# Patient Record
Sex: Female | Born: 1960
Health system: Southern US, Community
[De-identification: ages and names within clinical notes are randomized; demographics above are authoritative.]

## PROBLEM LIST (undated history)

## (undated) ENCOUNTER — Emergency Department (HOSPITAL_COMMUNITY): Admission: EM | Payer: Medicaid Other | Source: Home / Self Care

## (undated) DIAGNOSIS — M51369 Other intervertebral disc degeneration, lumbar region without mention of lumbar back pain or lower extremity pain: Secondary | ICD-10-CM

## (undated) DIAGNOSIS — J449 Chronic obstructive pulmonary disease, unspecified: Secondary | ICD-10-CM

## (undated) DIAGNOSIS — Z91148 Patient's other noncompliance with medication regimen for other reason: Secondary | ICD-10-CM

## (undated) DIAGNOSIS — I509 Heart failure, unspecified: Secondary | ICD-10-CM

## (undated) DIAGNOSIS — IMO0001 Reserved for inherently not codable concepts without codable children: Secondary | ICD-10-CM

## (undated) DIAGNOSIS — Z9114 Patient's other noncompliance with medication regimen: Secondary | ICD-10-CM

## (undated) DIAGNOSIS — M5136 Other intervertebral disc degeneration, lumbar region: Secondary | ICD-10-CM

## (undated) DIAGNOSIS — I739 Peripheral vascular disease, unspecified: Secondary | ICD-10-CM

## (undated) DIAGNOSIS — I10 Essential (primary) hypertension: Secondary | ICD-10-CM

## (undated) HISTORY — DX: Reserved for inherently not codable concepts without codable children: IMO0001

## (undated) HISTORY — PX: TUBAL LIGATION: SHX77

## (undated) HISTORY — DX: Peripheral vascular disease, unspecified: I73.9

## (undated) HISTORY — DX: Essential (primary) hypertension: I10

## (undated) HISTORY — DX: Heart failure, unspecified: I50.9

## (undated) HISTORY — DX: Chronic obstructive pulmonary disease, unspecified: J44.9

## (undated) HISTORY — PX: CARDIAC CATHETERIZATION: SHX172

---

## 2000-09-10 ENCOUNTER — Other Ambulatory Visit: Admission: RE | Admit: 2000-09-10 | Discharge: 2000-09-10 | Payer: Self-pay | Admitting: *Deleted

## 2000-09-12 ENCOUNTER — Emergency Department (HOSPITAL_COMMUNITY): Admission: EM | Admit: 2000-09-12 | Discharge: 2000-09-12 | Payer: Self-pay | Admitting: Emergency Medicine

## 2000-09-15 ENCOUNTER — Encounter: Payer: Self-pay | Admitting: *Deleted

## 2000-09-15 ENCOUNTER — Ambulatory Visit (HOSPITAL_COMMUNITY): Admission: RE | Admit: 2000-09-15 | Discharge: 2000-09-15 | Payer: Self-pay | Admitting: *Deleted

## 2001-02-26 ENCOUNTER — Emergency Department (HOSPITAL_COMMUNITY): Admission: EM | Admit: 2001-02-26 | Discharge: 2001-02-26 | Payer: Self-pay | Admitting: Emergency Medicine

## 2001-12-05 ENCOUNTER — Emergency Department (HOSPITAL_COMMUNITY): Admission: EM | Admit: 2001-12-05 | Discharge: 2001-12-05 | Payer: Self-pay | Admitting: Emergency Medicine

## 2002-04-30 ENCOUNTER — Emergency Department (HOSPITAL_COMMUNITY): Admission: EM | Admit: 2002-04-30 | Discharge: 2002-04-30 | Payer: Self-pay | Admitting: Emergency Medicine

## 2003-04-23 ENCOUNTER — Emergency Department (HOSPITAL_COMMUNITY): Admission: EM | Admit: 2003-04-23 | Discharge: 2003-04-23 | Payer: Self-pay | Admitting: Emergency Medicine

## 2003-07-07 ENCOUNTER — Emergency Department (HOSPITAL_COMMUNITY): Admission: EM | Admit: 2003-07-07 | Discharge: 2003-07-07 | Payer: Self-pay | Admitting: Emergency Medicine

## 2004-02-17 ENCOUNTER — Emergency Department (HOSPITAL_COMMUNITY): Admission: EM | Admit: 2004-02-17 | Discharge: 2004-02-17 | Payer: Self-pay | Admitting: Emergency Medicine

## 2004-03-08 ENCOUNTER — Emergency Department (HOSPITAL_COMMUNITY): Admission: EM | Admit: 2004-03-08 | Discharge: 2004-03-08 | Payer: Self-pay | Admitting: Emergency Medicine

## 2004-04-23 ENCOUNTER — Emergency Department (HOSPITAL_COMMUNITY): Admission: EM | Admit: 2004-04-23 | Discharge: 2004-04-23 | Payer: Self-pay | Admitting: Emergency Medicine

## 2004-07-16 ENCOUNTER — Emergency Department (HOSPITAL_COMMUNITY): Admission: EM | Admit: 2004-07-16 | Discharge: 2004-07-16 | Payer: Self-pay | Admitting: *Deleted

## 2006-05-07 ENCOUNTER — Emergency Department (HOSPITAL_COMMUNITY): Admission: EM | Admit: 2006-05-07 | Discharge: 2006-05-07 | Payer: Self-pay | Admitting: Emergency Medicine

## 2007-08-22 ENCOUNTER — Emergency Department (HOSPITAL_COMMUNITY): Admission: EM | Admit: 2007-08-22 | Discharge: 2007-08-22 | Payer: Self-pay | Admitting: Emergency Medicine

## 2007-08-24 ENCOUNTER — Emergency Department (HOSPITAL_COMMUNITY): Admission: EM | Admit: 2007-08-24 | Discharge: 2007-08-24 | Payer: Self-pay | Admitting: Emergency Medicine

## 2008-11-07 ENCOUNTER — Emergency Department (HOSPITAL_COMMUNITY): Admission: EM | Admit: 2008-11-07 | Discharge: 2008-11-07 | Payer: Self-pay | Admitting: Emergency Medicine

## 2010-03-25 ENCOUNTER — Emergency Department (HOSPITAL_COMMUNITY): Payer: Medicaid Other

## 2010-03-25 ENCOUNTER — Inpatient Hospital Stay (HOSPITAL_COMMUNITY)
Admission: EM | Admit: 2010-03-25 | Discharge: 2010-03-27 | Disposition: A | Discharge status: Other Acute Inpatient Hospital | Payer: Medicaid Other | Source: Home / Self Care | Attending: Pulmonary Disease | Admitting: Pulmonary Disease

## 2010-03-25 DIAGNOSIS — I509 Heart failure, unspecified: Secondary | ICD-10-CM | POA: Diagnosis present

## 2010-03-25 DIAGNOSIS — Z9119 Patient's noncompliance with other medical treatment and regimen: Secondary | ICD-10-CM

## 2010-03-25 DIAGNOSIS — Z6839 Body mass index (BMI) 39.0-39.9, adult: Secondary | ICD-10-CM

## 2010-03-25 DIAGNOSIS — J4489 Other specified chronic obstructive pulmonary disease: Secondary | ICD-10-CM | POA: Diagnosis present

## 2010-03-25 DIAGNOSIS — J449 Chronic obstructive pulmonary disease, unspecified: Secondary | ICD-10-CM | POA: Diagnosis present

## 2010-03-25 DIAGNOSIS — I5021 Acute systolic (congestive) heart failure: Principal | ICD-10-CM | POA: Diagnosis present

## 2010-03-25 DIAGNOSIS — I10 Essential (primary) hypertension: Secondary | ICD-10-CM | POA: Diagnosis present

## 2010-03-25 DIAGNOSIS — Z91199 Patient's noncompliance with other medical treatment and regimen due to unspecified reason: Secondary | ICD-10-CM

## 2010-03-25 DIAGNOSIS — R9389 Abnormal findings on diagnostic imaging of other specified body structures: Secondary | ICD-10-CM | POA: Diagnosis present

## 2010-03-25 DIAGNOSIS — IMO0001 Reserved for inherently not codable concepts without codable children: Secondary | ICD-10-CM | POA: Diagnosis present

## 2010-03-25 DIAGNOSIS — R0789 Other chest pain: Secondary | ICD-10-CM | POA: Diagnosis present

## 2010-03-25 DIAGNOSIS — Z5987 Material hardship due to limited financial resources, not elsewhere classified: Secondary | ICD-10-CM

## 2010-03-25 DIAGNOSIS — I2510 Atherosclerotic heart disease of native coronary artery without angina pectoris: Secondary | ICD-10-CM | POA: Diagnosis present

## 2010-03-25 DIAGNOSIS — I428 Other cardiomyopathies: Secondary | ICD-10-CM | POA: Diagnosis present

## 2010-03-25 DIAGNOSIS — Z598 Other problems related to housing and economic circumstances: Secondary | ICD-10-CM

## 2010-03-25 DIAGNOSIS — F17200 Nicotine dependence, unspecified, uncomplicated: Secondary | ICD-10-CM | POA: Diagnosis present

## 2010-03-25 LAB — CBC
MCH: 30.6 pg (ref 26.0–34.0)
MCHC: 32.8 g/dL (ref 30.0–36.0)
MCV: 93.3 fL (ref 78.0–100.0)
Platelets: 239 10*3/uL (ref 150–400)
RDW: 16 % — ABNORMAL HIGH (ref 11.5–15.5)

## 2010-03-25 LAB — BASIC METABOLIC PANEL
CO2: 25 mEq/L (ref 19–32)
Chloride: 96 mEq/L (ref 96–112)
Creatinine, Ser: 0.61 mg/dL (ref 0.4–1.2)
GFR calc Af Amer: >60 mL/min (ref >60)

## 2010-03-25 LAB — CK TOTAL AND CKMB (NOT AT ARMC): CK, MB: 3.8 ng/mL (ref 0.3–4.0)

## 2010-03-25 LAB — D-DIMER, QUANTITATIVE: D-Dimer, Quant: 0.34 ug/mL-FEU (ref 0.00–0.48)

## 2010-03-25 LAB — BRAIN NATRIURETIC PEPTIDE: Pro B Natriuretic peptide (BNP): 146 pg/mL — ABNORMAL HIGH (ref 0.0–100.0)

## 2010-03-26 DIAGNOSIS — I059 Rheumatic mitral valve disease, unspecified: Secondary | ICD-10-CM

## 2010-03-26 DIAGNOSIS — R079 Chest pain, unspecified: Secondary | ICD-10-CM

## 2010-03-26 LAB — DIFFERENTIAL
Basophils Absolute: 0 10*3/uL (ref 0.0–0.1)
Basophils Relative: 0 % (ref 0–1)
Eosinophils Relative: 1 % (ref 0–5)
Monocytes Absolute: 0.5 10*3/uL (ref 0.1–1.0)
Neutro Abs: 5.2 10*3/uL (ref 1.7–7.7)

## 2010-03-26 LAB — BASIC METABOLIC PANEL
Calcium: 8.5 mg/dL (ref 8.4–10.5)
Creatinine, Ser: 0.72 mg/dL (ref 0.4–1.2)
GFR calc Af Amer: >60 mL/min (ref >60)
GFR calc non Af Amer: >60 mL/min (ref >60)

## 2010-03-26 LAB — T3: T3, Total: 123 ng/dl (ref 80.0–204.0)

## 2010-03-26 LAB — CBC
Hemoglobin: 12.4 g/dL (ref 12.0–15.0)
MCHC: 32.8 g/dL (ref 30.0–36.0)
Platelets: 214 10*3/uL (ref 150–400)
RDW: 16.1 % — ABNORMAL HIGH (ref 11.5–15.5)

## 2010-03-26 LAB — TSH: TSH: 1.889 u[IU]/mL (ref 0.350–4.500)

## 2010-03-26 LAB — TROPONIN I: Troponin I: 0.04 ng/mL (ref 0.00–0.06)

## 2010-03-26 NOTE — Consult Note (Signed)
Felicia [name], Felicia [name]            ACCOUNT NO.:  [id number]  MEDICAL RECORD NO.:  [id number]           PATIENT TYPE:  I  LOCATION:  A334                          FACILITY:  [facility]  PHYSICIAN:  Jonelle Sidle, MD DATE OF BIRTH:  02-18-1960  DATE OF CONSULTATION: DATE OF DISCHARGE:                                CONSULTATION   ADDENDUM.  REQUESTING PHYSICIAN:  Edward L. Juanetta Gosling, M.D.  REASON FOR CONSULTATION:  Shortness of breath and hypertension.  Please refer to the full cardiology consultation dictation by Ms. Joni Reining, nurse practitioner.  HISTORY OF PRESENT ILLNESS:  In summary, Felicia [name] is a 50 year old morbidly obese woman with a history of hypertension, noncompliant with medications and regular follow up.  She presented with a history of progressing dyspnea on exertion over the last few months, increased in intensity of late.  She was noted to be significantly hypertensive with blood pressures as high as 214/110, off medications.  She was assessed in the Emergency Department, treated initially with intravenous enalapril and initiated on nitroglycerin paste, placed subsequently on clonidine and hydralazine, and has shown better blood pressure control. She feels better symptomatically.  Her BNP level was only mildly increased at 146 and her chest x-ray did show some evidence of volume overload with pulmonary vascular congestion and cardiomegaly.  Cardiac markers have been reassuring however, and she has not reported any chest pain during the observation, peak troponin-I 0.04.  No previous structural cardiac workup is noted.  PHYSICAL EXAMINATION:  VITAL SIGNS:  On my examination, her temperature is 97.8 degrees, heart rate is in the 80s to 90s and sinus rhythm, respirations 16, blood pressure 149/95, ox saturation 91%-95% on room air. GENERAL:  She is morbidly obese, 93 kg, short in stature. HEENT:  Conjunctivae and lids normal.  Oropharynx is clear with  moist mucosa. NECK:  Supple.  No elevated JVP.  No carotid bruits.  Increased girth. Difficult to assess thyroid. LUNGS:  Clear with diminished breath sounds. CARDIAC:  Distant regular heart sounds.  Questionable soft S4.  No significant systolic murmur or pericardial rub. ABDOMEN:  Obese.  Unable to palpate liver edge.  Bowel sounds present. EXTREMITIES:  Exhibit no frank pitting edema.  Distal pulses 1 to 2+. SKIN:  Warm and dry. MUSCULOSKELETAL:  No kyphosis is noted. NEUROPSYCHIATRIC:  The patient is alert and oriented x3.  Affect seems appropriate.  LABORATORY DATA:  Hemoglobin is 12.4, platelets 214.  D-dimer 0.34. Potassium 3.9, sodium 135, BUN and creatinine 13 and 0.7 respectively.  A 12-lead electrocardiogram shows sinus rhythm with nonspecific T-wave changes in the inferolateral leads, corrected QT interval prolonged ranging from 474 up to 490.  IMPRESSION: 1. Hypertension, poorly controlled, affected by noncompliance.  Blood     pressure trend is coming under better control. 2. Presentation with progressive shortness of breath as noted,     question component of acute systolic or diastolic heart failure, complicated by     significant hypertension.  Follow up 2-D echocardiogram is pending.     Cardiac markers argue against an acute coronary syndrome as does     electrocardiogram. 3 . Morbid obesity,  question potential for obstructive sleep apnea, which could also adversely affect blood pressure control.  RECOMMENDATIONS:  Medications will be simplified to Norvasc and low-dose diuretic such as chlorthalidone for now although may be more specifically  guided by the echocardiogram results. Would tend to avoid clonidine with history of noncompliance and hydralazine given t.i.d. dosing.  Will follow up on the 2-D echocardiogram that has been ordered and can make further recommendations from there.     Jonelle Sidle, MD     SGM/MEDQ  D:  03/26/2010  T:   03/26/2010  Job:  [id number]  cc:   Ramon Dredge L. Juanetta Gosling, M.D. Fax: [phone]  Electronically Signed by Nona Dell MD on 03/26/2010 12:45:34 PM

## 2010-03-27 ENCOUNTER — Encounter (HOSPITAL_COMMUNITY): Payer: Self-pay

## 2010-03-27 ENCOUNTER — Inpatient Hospital Stay (HOSPITAL_COMMUNITY)
Admission: AD | Admit: 2010-03-27 | Discharge: 2010-03-28 | DRG: 287 | Disposition: A | Discharge status: Home / Self Care | Payer: Medicaid Other | Source: Other Acute Inpatient Hospital | Attending: Internal Medicine | Admitting: Internal Medicine

## 2010-03-27 DIAGNOSIS — I5021 Acute systolic (congestive) heart failure: Secondary | ICD-10-CM

## 2010-03-27 DIAGNOSIS — I509 Heart failure, unspecified: Secondary | ICD-10-CM

## 2010-03-27 LAB — BASIC METABOLIC PANEL
CO2: 29 mEq/L (ref 19–32)
Glucose, Bld: 176 mg/dL — ABNORMAL HIGH (ref 70–99)
Potassium: 4.2 mEq/L (ref 3.5–5.1)
Sodium: 131 mEq/L — ABNORMAL LOW (ref 135–145)

## 2010-03-27 LAB — LIPID PANEL
HDL: 47 mg/dL (ref >39)
LDL Cholesterol: 64 mg/dL (ref 0–99)
Triglycerides: 101 mg/dL (ref <150)
VLDL: 20 mg/dL (ref 0–40)

## 2010-03-28 DIAGNOSIS — I428 Other cardiomyopathies: Secondary | ICD-10-CM

## 2010-03-28 LAB — BASIC METABOLIC PANEL
BUN: 11 mg/dL (ref 6–23)
Calcium: 8.9 mg/dL (ref 8.4–10.5)
GFR calc non Af Amer: >60 mL/min (ref >60)
Potassium: 4 mEq/L (ref 3.5–5.1)

## 2010-03-28 LAB — POCT I-STAT 3, ART BLOOD GAS (G3+)
Acid-Base Excess: 3 mmol/L — ABNORMAL HIGH (ref 0.0–2.0)
pCO2 arterial: 43.4 mmHg (ref 35.0–45.0)
pO2, Arterial: 54 mmHg — ABNORMAL LOW (ref 80.0–100.0)

## 2010-03-28 LAB — POCT ACTIVATED CLOTTING TIME
Activated Clotting Time: 116 seconds
Activated Clotting Time: 205 seconds

## 2010-03-28 LAB — CBC
MCHC: 32.9 g/dL (ref 30.0–36.0)
MCV: 93.1 fL (ref 78.0–100.0)
Platelets: 231 10*3/uL (ref 150–400)
RDW: 15.8 % — ABNORMAL HIGH (ref 11.5–15.5)
WBC: 10.1 10*3/uL (ref 4.0–10.5)

## 2010-03-28 LAB — POCT I-STAT 3, VENOUS BLOOD GAS (G3P V)
pCO2, Ven: 50.4 mmHg — ABNORMAL HIGH (ref 45.0–50.0)
pH, Ven: 7.39 — ABNORMAL HIGH (ref 7.250–7.300)

## 2010-03-30 NOTE — Procedures (Signed)
Felicia [name], Felicia [name]            ACCOUNT NO.:  [id number]  MEDICAL RECORD NO.:  [id number]           PATIENT TYPE:  I  LOCATION:  3731                         FACILITY:  [facility]  PHYSICIAN:  Marca Ancona, MD      DATE OF BIRTH:  1960-06-29  DATE OF PROCEDURE:  03/27/2010 DATE OF DISCHARGE:                           CARDIAC CATHETERIZATION   PROCEDURES: 1. Left heart catheterization. 2. Right heart catheterization. 3. Coronary angiography. 4. Left ventriculography.  INDICATIONS:  This is a 50 year old with new-onset cardiomyopathy and congestive heart failure.  PROCEDURE NOTE:  After informed consent was obtained, the right groin was sterilely prepped and draped.  Lidocaine 1% was used to locally anesthetize the right groin area.  The right common femoral vein was entered using modified Seldinger technique.  A 7-French venous sheath was placed.  The right common femoral artery was accessed using modified Seldinger technique and a 5-French arterial sheath was placed.  Right heart catheterization was then carried out using balloon-tip Swan-Ganz catheter.  Samples were removed for oxygen saturation from the pulmonary artery and from the femoral artery.  Then the circumflex was engaged using the JL-4.0 catheter.  Of note, the circumflex and the LAD had separate ostia.  The LAD was engaged using the JL-3.5 catheter.  Right coronary artery was engaged using the JR-4 catheter.  Left ventricle was entered using angled pigtail catheter.  Hand ventriculogram only was done given LVEDP of 29.  There were no complications.  FINDINGS: 1. Hemodynamics:  Mean right atrial pressure 8 mmHg, RV 45/11, PA     48/22 with mean PA pressure of 32, pulmonary capillary wedge     pressure mean 17, mixed venous oxygen saturation 55%, aortic     saturation 97%, LV 150/29, aorta 151/92.  Cardiac output 3.49     liters per minute.  Cardiac index 1.88, pulmonary vascular     resistance 4.3 Wood units.   SVR 2497. 2. Left ventriculography:  Hand injection only for left ventriculogram was done given elevated     LVEDP.  The LV appeared mildly dilated, EF was about 30%.  There     was global hypokinesis. 3. Right coronary artery:  The right coronary artery was a dominant     vessel.  There were luminal irregularities only. 4. Left circumflex:  Left circumflex had separate ostium from the LAD.     There was a small first obtuse marginal with about 60% ostial stenosis.     There was a moderate second obtuse marginal about 40% ostial     stenosis. 5. LAD system:  The LAD had a separate ostium from the left circumflex. There was a high first diagonal with no significant disease.  The LAD itself had no significant disease.  IMPRESSION:  The patient has a nonischemic cardiomyopathy, left ventricular end-diastolic pressure and right atrial pressure are elevated still, although interestingly, the pulmonary capillary wedge pressure ended up looking okay.  Given the elevated left ventricular end- diastolic pressure, I think we should continue her diuresis, would advance her afterload reduction.     Marca Ancona, MD     DM/MEDQ  D:  03/27/2010  T:  03/28/2010  Job:  [id number]  Electronically Signed by Marca Ancona MD on 03/29/2010 09:22:00 AM

## 2010-04-02 NOTE — H&P (Addendum)
NAME:  Felicia [name], WACHTER            ACCOUNT NO.:  [id number]  MEDICAL RECORD NO.:  [id number]           PATIENT TYPE:  I  LOCATION:  A334                          FACILITY:  [facility]  PHYSICIAN:  Jermari Tamargo D. [name], MD   DATE OF BIRTH:  1960-01-26  DATE OF ADMISSION:  03/25/2010 DATE OF DISCHARGE:  LH                             HISTORY & PHYSICAL   CHIEF COMPLAINT:  Chest pain.  HISTORY OF PRESENT ILLNESS:  This is a 72 years' old female patient with history of hypertension and chronic obstructive pulmonary disease, came to emergency room due to the above complaint.  The patient claims she was started on antihypertensive medications few years back.  Later after her blood pressure was controlled, she was taken off the medications. She has a history of COPD, but has not been on treatment or regular followup.  She continued to smoke about half pack of cigarettes per day. Three days back, she started having chest pain in the midsternal area. Pain persisted for about 24 hours and later improved.  She was seen in the emergency room and was found to have high blood pressure.  Her initial cardiac enzymes were negative.  Due to her multiple risk factors, the patient was admitted for further evaluation and workup.  REVIEW OF SYSTEMS:  No fever or chills, nausea or vomiting, abdominal pain, dysuria, urgency or frequency of urination.  PAST MEDICAL HISTORY: 1. Hypertension. 2. Chronic obstructive pulmonary disease.  CURRENT MEDICATIONS:  The patient is not on any prescription or over-the- counter medications.  SOCIAL HISTORY:  The patient lives with her 2 children.  She smokes about half a pack of cigarettes per day.  She drinks alcohol socially. No history of alcohol abuse.  FAMILY HISTORY:  Her mother died of stroke.  Her father also died of lung cancer.  No history of diabetes in the family.  PHYSICAL EXAMINATION:  GENERAL:  The patient is alert, awake, and comfortable. VITAL SIGNS:   Blood pressure 149/95, pulse 88, respiratory rate 16, temperature 97.8 degrees Fahrenheit. HEENT: Pupils are equal and reactive. NECK: Supple. CHEST:  Decreased air entry, few rhonchi. CARDIOVASCULAR SYSTEM:  First and second heart sounds heard.  No murmur, no gallop. ABDOMEN:  Obese, soft and lax.  Bowel sounds are positive.  No mass or organomegaly. EXTREMITIES: No leg edema.  LABS ON ADMISSION:  CBC:  WBC 11.2, hemoglobin 14.1, hematocrit 43.0, platelets 269.  Sodium 135, potassium 3.9, chloride 99, carbon dioxide 28, glucose 196, BUN 13, creatinine 0.7, calcium 8.5, troponin 0.04, D- dimer 0.34, BNP 146.  ASSESSMENT: 1. Chest pain, to rule out unstable angina. 2. History of chronic obstructive pulmonary disease. 3. Hypertension, not controlled. 4. Morbid obesity.  PLAN:  We will do serial EKG, cardiac enzymes.  We will do echocardiogram.  We will adjust her blood pressure medications.  Start the patient on nebulizer treatment.  We will do Cardiology consult for further evaluation. Felicia [name] D. [name], MD   ______________________________ Felicia [name]. [name], MD    TDF/MEDQ  D:  03/26/2010  T:  03/26/2010  Job:  [id number]  Electronically Signed by Avon Gully MD on 04/23/2010  07:48:06 AM

## 2010-04-03 NOTE — Discharge Summary (Signed)
NAMESHEREDA, GRAW NO.:  [id number]  MEDICAL RECORD NO.:  [id number]           PATIENT TYPE:  I  LOCATION:  3731                         FACILITY:  [facility]  PHYSICIAN:  Luis Abed, MD, FACCDATE OF BIRTH:  1960/04/09  DATE OF ADMISSION:  03/27/2010 DATE OF DISCHARGE:  03/28/2010                              DISCHARGE SUMMARY   PRIMARY CARDIOLOGIST:  Jonelle Sidle, MD  CARDIAC NURSE PRACTITIONER:  Bettey Mare. Lyman Bishop, NP  PRIMARY CARE PHYSICIAN:  Oneal Deputy. Juanetta Gosling, MD  DISCHARGE DIAGNOSES: 1. Nonischemic cardiomyopathy.     a.     Two-D echocardiogram completed at [facility]      revealing LVEF of 25-30%, grade 1 diastolic dysfunction, and PAP      moderately increased to 50 mmHg.     b.     Cardiac catheterization March 27, 2010:  Nonischemic      cardiomyopathy, LV end-diastolic pressure and right atrial      pressure are elevated still, interestingly, PCWP ended up looking      okay.  Given elevated LV end-diastolic pressure, should continue      diuresis and advanced afterload reduction.     c.     Weight on admission 93.9 kg, weight on discharge 89.7 kg.      Systolic blood pressure ranged from 122-147. 2. Ongoing tobacco abuse (20 pack-year history, one-half PPD). 3. Morbid obesity (BMI 39.8). 4. History of poor medical compliance. 5. Uncontrolled hypertension (please see discharge meds for final meds     at discharge). 6. Uncontrolled diabetes mellitus (please see discharge meds for final     meds at discharge).  SECONDARY DIAGNOSIS:  Chronic obstructive pulmonary disease.  ALLERGIES:  NKDA.  PROCEDURES: 1. EKG (completed at [facility]) March 26, 2010:  Sinus rhythm     with nonspecific T-wave changes in inferolateral leads, QTC     prolonged (474-490). 2. Two-D echocardiogram (completed at [facility]) March 26, 2010:  LVEF 25-30%, grade 1 diastolic dysfunction, PAP moderately     increased at 59  mmHg. 3. Cardiac catheterization March 27, 2010:  Mean right atrial pressure     8 mmHg, RV 45/11, PA 48/22, mean PA pressure 32, PCWP mean 17,     mixed venous oxygen sat 55%, aortic sat 97%, LV 140/29, aorta     151/92, cardiac output 3.49 liters per minute, cardiac index 1.88,     pulmonary vascular resistance 4.3 Wood units, SVR 2.97.  LVEF 30%,     global hypokinesis, mildly dilated LV.  RCA dominant with luminal     irregularities only.  Left circumflex separate ostia from LAD with     small first OM about 60% ostial stenosis, OM2 40% ostial stenosis.     LAD high first diagonal with no significant disease, LAD itself no     significant disease.  HISTORY OF PRESENT ILLNESS:  Felicia [name] is a 50 year old female who initially presented to [facility] with complaints of chest discomfort and Cardiology was ultimately consulted.  She had a 2-D echocardiogram that showed depressed LVEF  and then subsequently transferred to [facility] for diagnostic cardiac catheterization.  HOSPITAL COURSE:  The patient was admitted, underwent procedures as described above.  She tolerated them well without any significant complications.  She was kept overnight after her catheterization and deemed stable for discharge by attending cardiologist, Dr. Willa Rough. Her meds were adjusted both at [facility] and [facility] (please see discharge meds).  She will have early followup in [place] with nurse practitioner Joni Reining on April 11, 2010, at 1:40 p.m.Marland Kitchen  At the time of discharge, the patient received a new medication list, prescriptions, followup instructions, and post-cath instructions. All questions and concerns were addressed prior to leaving the hospital.  DISCHARGE LABS:  WBC 10.1, HGB 13.3, HCT 40.4, PLT count is 231, WBC differential on admission was within normal limits.  Pro-time 12.6, INR was 0.92.  Sodium 135, potassium 4.0, chloride 98, bicarb 30, BUN  11, creatinine 0.70, glucose ranged from 176-196.  Hemoglobin A1c 7.5% with mean plasma glucose of 169.  Troponin-I 0.04.  Total cholesterol 131, triglycerides 101, HDL 47, LDL 64, total cholesterol/HDL ratio of 2.8. Free T4 9.0, TSH 1.889, T3 123.0.  FOLLOWUP PLANS AND APPOINTMENTS: 1. BMET in [place] in 1 week. 2. Joni Reining at [facility] office April 11, 2010, at 1:40 p.m.  DISCHARGE MEDICATIONS: 1. Acetaminophen 325 mg 1-2 tablets p.o. q.4 h. P.r.n. 2. Enteric-coated aspirin 81 mg p.o. daily. 3. Carvedilol 6.25 mg 1 tablet p.o. b.i.d. with meals. 4. Chlorthalidone 25 mg 1 tablet p.o. daily. 5. Lisinopril 10 mg 1 tablet p.o. daily. 6. Metformin 500 mg 1 tablet p.o. b.i.d. (start taking 1 tablet daily     for 1 week and then increase to 1 tablet p.o. b.i.d.). 7. Benadryl 25 mg 1 tablet p.o. daily p.r.n.  DURATION OF DISCHARGE ENCOUNTER:  Including physician time, 35 minutes.     Jarrett Ables, PAC   ______________________________ Luis Abed, MD, [facility]    MS/MEDQ  D:  03/28/2010  T:  03/29/2010  Job:  [id number]  cc:   Jonelle Sidle, MD Bettey Mare. Lyman Bishop, NP  Electronically Signed by Jarrett Ables PAC on 03/29/2010 01:01:53 PM Electronically Signed by Willa Rough MD FACC on 04/03/2010 10:56:41 AM

## 2010-04-04 ENCOUNTER — Encounter: Payer: Self-pay | Admitting: Adult Health

## 2010-04-04 LAB — CONVERTED CEMR LAB
BUN: 23 mg/dL (ref 6–23)
Chloride: 99 meq/L (ref 96–112)
Glucose, Bld: 163 mg/dL — ABNORMAL HIGH (ref 70–99)
Potassium: 5 meq/L (ref 3.5–5.3)
Sodium: 132 meq/L — ABNORMAL LOW (ref 135–145)

## 2010-04-10 ENCOUNTER — Encounter: Payer: Self-pay | Admitting: *Deleted

## 2010-04-11 ENCOUNTER — Encounter: Payer: Self-pay | Admitting: Adult Health

## 2010-04-11 ENCOUNTER — Ambulatory Visit (INDEPENDENT_AMBULATORY_CARE_PROVIDER_SITE_OTHER): Payer: Self-pay | Admitting: Adult Health

## 2010-04-11 DIAGNOSIS — I428 Other cardiomyopathies: Secondary | ICD-10-CM

## 2010-04-11 DIAGNOSIS — I43 Cardiomyopathy in diseases classified elsewhere: Secondary | ICD-10-CM

## 2010-04-11 DIAGNOSIS — I10 Essential (primary) hypertension: Secondary | ICD-10-CM

## 2010-04-11 DIAGNOSIS — E109 Type 1 diabetes mellitus without complications: Secondary | ICD-10-CM

## 2010-04-11 DIAGNOSIS — E119 Type 2 diabetes mellitus without complications: Secondary | ICD-10-CM | POA: Insufficient documentation

## 2010-04-11 NOTE — Patient Instructions (Signed)
DAILY WEIGHTS NEEDS TO BUY A SCALE LOW SALT DIET

## 2010-04-11 NOTE — Progress Notes (Signed)
   Patient ID: Felicia Felicia [name], female    DOB: 1960/04/08, 50 y.o.   MRN: [id number]  HPIMrs [name] is a patient of Dr.McDowell that we are seeing on hospital follow up after admission to [facility] hospital secondary to transfer from [facility] in the setting of CHF and systolic dysfunction.  She has a history of COPD, diabetes and uncontrolled hypertension. Echocardiogram completed at [facility] demonstrated EF of 25-30% with grade I diastolic dsyfunction.  Cardiac cath demonstrated nonischemic CM, LV end diastolic pressure and right atrial pressure were elevated. The coronary arteries were Felicia [name] of significant disease. There was some Cx branch disease of both OM's at 60 %. She was diuresed significantly with admission wt of 93.9 kg and discharge weight of 89.7 kg.    Since discharge she has been doing well. Maintaining her weight, medically complaint and strictly reducing salt. She is without complaint of SOB, CP or weakness.    Review of Systems Review of systems complete and found to be negative unless listed above     Physical Exam General: Well developed, well nourished, in no acute distress Head: Eyes PERRLA, No xanthomas.   Normal cephalic and atramatic  Lungs: Clear bilaterally to auscultation and percussion. Heart: HRRR S1 S2, with soft S4 murmur.  Pulses are 2+ & equal.          No carotid bruits, no JVD.  No abdominal bruits. No femoral bruits. Abdomen: Bowel sounds are positive, abdomen soft and non-tender without masses or                  Hernia's noted. Obese Msk:  Back normal, normal gait. Normal strength and tone for age. Extremities: No edema. Neuro: Alert and oriented X 3. Psych: Good affect.

## 2010-04-12 NOTE — Progress Notes (Addendum)
  Felicia [name], Felicia [name]            ACCOUNT NO.:  [id number]  MEDICAL RECORD NO.:  [id number]           PATIENT TYPE:  I  LOCATION:  3731                         FACILITY:  [facility]  PHYSICIAN:  Aubria Vanecek L. Juanetta Gosling, M.D.DATE OF BIRTH:  1960/02/25  DATE OF PROCEDURE: DATE OF DISCHARGE:                                PROGRESS NOTE   [name] has been evaluated by the [facility] Cardiology Team with plans now to transfer her to [facility] because of her very low ejection fraction.  She has multiple medical problems.  She is planned to be transferred for cardiac catheterization.  I will see her after she is discharged.     Bellamarie Pflug L. Juanetta Gosling, M.D.     ELH/MEDQ  D:  03/27/2010  T:  03/27/2010  Job:  [id number]  Electronically Signed by Kari Baars M.D. on 04/12/2010 09:05:14 AM

## 2010-04-13 NOTE — Consult Note (Signed)
Felicia [name], Felicia [name]            ACCOUNT NO.:  [id number]  MEDICAL RECORD NO.:  [id number]           PATIENT TYPE:  I  LOCATION:  A334                          FACILITY:  [facility]  PHYSICIAN:  Jonelle Sidle, MD DATE OF BIRTH:  1960-10-23  DATE OF CONSULTATION: DATE OF DISCHARGE:                                CONSULTATION   PRIMARY CARDIOLOGIST:  Will be new, Dr. Nona Dell.  PRIMARY CARE PHYSICIAN:  Dr. Juanetta Gosling, but she has not seen him in greater than a year.  REASON FOR CONSULTATION:  Chest pain.  HISTORY OF PRESENT ILLNESS:  This is a 50 year old African American female who was obese with history of hypertension, but has not been taking medication for greater than one year secondary to financial issues with increasing dyspnea on exertion over the last 2 months, but over the last 24 hours, it was severe with chest pain and dyspnea on exertion with cough.  She states the cough made her breathing worse. She felt very tight in her chest lasting greater than 8 hours.  She went to bed Saturday night and awoke Sunday morning.  She said she felt better, but then had worsening shortness of breath and chest pain shortly thereafter.  She states that she was panting to breathe.  She checks her blood pressure and fount that it was elevated, so she came to the emergency room.  On arrival, her blood pressure was 214/110.  She states that she has had occasional shortness of breath in the past, but not as severe.  In the ER, she was given nitroglycerin sublingual and then a patch was placed.  She was also provided with aspirin and IV enalapril.  The patient was initially placed on clonidine and hydralazine p.r.n., now she has been taken off of this and changed to Norvasc 5 mg daily.  Her blood pressures in the 140s.  Her chest x-ray revealed mild CHF.  No Lasix had been given.  She feels better now.  She states she is not taking the blood pressure medications greater than 1 year  secondary to financial issues.  She has been off Medicaid.  The patient denies edema, diaphoresis, nausea, or vomiting.  REVIEW OF SYSTEMS:  Positive for chest pain, shortness of breath, dyspnea on exertion, cough, no lower extremity edema, nausea, vomiting. All other systems are reviewed and are found to be negative.  CODE STATUS:  Full code.  PAST MEDICAL HISTORY: 1. Hypertension greater than 10 years, but not been on any medications     for greater than 1 year. 2. 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  PAST SURGICAL HISTORY:  Bilateral tubal ligation.  FAMILY HISTORY:  She lives in [place] with her children two.  She is single.  She is a 20-pack-year smoker.  Negative for drug use, occasional alcohol.  FAMILY HISTORY:  Mother deceased from a CVA.  Father deceased from lung cancer.  She had one brother who is deceased from AIDS and one brother who is deceased from lung cancer.  CURRENT MEDICATIONS  PRIOR TO ADMISSION:  None.  ALLERGIES:  None.  CURRENT LABORATORY DATA:  Sodium 135, potassium 3.9, chloride 99, CO2 of 28, BUN 13, creatinine 0.72, glucose 196, hemoglobin 12.4, hematocrit 37.8, white blood cells 9.8, platelets 214, D-dimer 0.34.  BNP 146, troponin 0.04, 0.04, and 0.04 respectively.  EKG revealing sinus rhythm with inferior lateral T-wave flattening.  Her heart rate was 85 beats per minute in sinus rhythm.  Chest x-ray, mild CHF, cephalization of the pulmonary blood flow with pulmonary vascular congestion fullness in the right hilum.  PHYSICAL EXAMINATION:  VITAL SIGNS:  Blood pressure 149/95, pulse 88, respirations 16, temperature 97.8, O2 sat 95% on room air, her weight is 93.6 kg.  GENERAL:  She is awake, alert, and oriented, in no acute distress. HEENT:  Head is normocephalic and atraumatic.  Right eye is turned inward, but she is able to have PERRLA. NECK:  Supple, obese, cannot  ascertain for JVD. CARDIOVASCULAR:  Distant heart sounds with regular rate and rhythm. Pulses are 2+ and equal. LUNGS:  Clear to auscultation without wheezes, rales, or rhonchi. ABDOMEN:  Soft, nontender, obese, 2+ bowel sounds. EXTREMITIES:  Without clubbing, cyanosis, or edema. MUSCULOSKELETAL:  No joint deformity or tenderness. NEUROLOGIC:  Cranial nerves II-XII are grossly intact.  IMPRESSION: 1. Chest pain described as tightness, constant with associated     shortness of breath and dyspnea on exertion which was progressive     for months, but had sudden increase in severity over the last 2     days prior to admission.  Cardiovascular risk factors include     hypertension, obesity, and tobacco use.  She has a questionable     lipid status which will be checked.  Echo has been ordered, but     acoustic windows may be difficult secondary to body habitus.     Suspect possible right heart failure, also concerned about her     history of "hole in heart" in child, question ventricular septal     defect, echo will be helpful for evaluation of this. 2. Dyspnea on exertion.  Chest x-ray revealing mild congestive heart  failure, but no clinical evidence now, add chlorthalidone to     medication regimen to assist with diuresis and hypertension. 3. Hypertension with associated medical noncompliance secondary to     cost issues.  Social Services to assist.  Currently, her blood     pressure is moderately controlled.  She has been taken off of     hydralazine and clonidine and started on Norvasc 5 mg.  She remains     on nitroglycerin paste.  We will monitor her response. 4. Questionable sleep apnea contributing to hypertension.  Blood     pressure is better on medication.  For body habitus, she does have     a high probability for sleep apnea, suggest sleep study as an     outpatient.  Overnight O2 sats will be completed.  This is a 50 year old African American female, we are  following secondary to recurrent chest pain with hypertension and obesity.  The patient is without symptoms at this time.  EKG shows some inferior lateral T-wave flattening.  Echocardiogram will be completed.  Troponins are negative x3.  She will need to have an ischemic workup eventually once we evaluate her heart function with an echocardiogram.  Blood pressure needs to be better controlled and Social Services are recommended to assist with this.  She will be started on mild diuretic to assist with  blood pressure control with mild CHF.  We will make further recommendations based upon test results and patient's response to treatment.  Please see Dr. Ival Bible addendum for more recommendations and his thoughts on this case.  On behalf of the physicians, providers of [employer], we would like to thank Dr. Juanetta Gosling for allowing [place] to participate in the care of this patient.     Bettey Mare. Lyman Bishop, NP   ______________________________ Jonelle Sidle, MD    KML/MEDQ  D:  03/26/2010  T:  03/26/2010  Job:  [id number]  Electronically Signed by Joni Reining NP on 04/09/2010 07:52:29 AM Electronically Signed by Nona Dell MD on 04/13/2010 11:43:42 AM

## 2010-04-17 ENCOUNTER — Encounter: Payer: Self-pay | Admitting: Adult Health

## 2010-04-26 ENCOUNTER — Emergency Department (HOSPITAL_COMMUNITY)
Admission: EM | Admit: 2010-04-26 | Discharge: 2010-04-26 | Disposition: A | Discharge status: Home / Self Care | Payer: Medicaid Other | Attending: Emergency Medicine | Admitting: Emergency Medicine

## 2010-04-26 DIAGNOSIS — R319 Hematuria, unspecified: Secondary | ICD-10-CM | POA: Insufficient documentation

## 2010-04-26 DIAGNOSIS — R109 Unspecified abdominal pain: Secondary | ICD-10-CM | POA: Insufficient documentation

## 2010-04-26 DIAGNOSIS — E119 Type 2 diabetes mellitus without complications: Secondary | ICD-10-CM | POA: Insufficient documentation

## 2010-04-26 DIAGNOSIS — I10 Essential (primary) hypertension: Secondary | ICD-10-CM | POA: Insufficient documentation

## 2010-04-26 DIAGNOSIS — M549 Dorsalgia, unspecified: Secondary | ICD-10-CM | POA: Insufficient documentation

## 2010-04-26 DIAGNOSIS — N390 Urinary tract infection, site not specified: Secondary | ICD-10-CM | POA: Insufficient documentation

## 2010-04-26 DIAGNOSIS — Z79899 Other long term (current) drug therapy: Secondary | ICD-10-CM | POA: Insufficient documentation

## 2010-04-26 DIAGNOSIS — R509 Fever, unspecified: Secondary | ICD-10-CM | POA: Insufficient documentation

## 2010-04-26 LAB — URINALYSIS, ROUTINE W REFLEX MICROSCOPIC
Glucose, UA: NEGATIVE mg/dL
pH: 5.5 (ref 5.0–8.0)

## 2010-04-26 LAB — URINE MICROSCOPIC-ADD ON

## 2010-04-26 LAB — GLUCOSE, CAPILLARY: Glucose-Capillary: 198 mg/dL — ABNORMAL HIGH (ref 70–99)

## 2010-04-27 LAB — URINE CULTURE: Culture: NO GROWTH

## 2010-05-15 NOTE — H&P (Signed)
Felicia [name], Felicia [name]            ACCOUNT NO.:  [id number]  MEDICAL RECORD NO.:  [id number]           PATIENT TYPE:  I  LOCATION:  A334                          FACILITY:  [facility]  PHYSICIAN:  Jonelle Sidle, MD DATE OF BIRTH:  02-11-1960  DATE OF ADMISSION:  03/25/2010 DATE OF DISCHARGE:  LH                             HISTORY & PHYSICAL   TRANSFER SUMMARY  DATE OF TRANSFER:  March 27, 2010.  PRIMARY CARDIOLOGIST:  Jonelle Sidle, MD  PRIMARY CARE PHYSICIAN:  Oneal Deputy. Juanetta Gosling, MD  REASON FOR TRANSFER:  Cardiac catheterization at [facility].  HOSPITAL COURSE:  This is a 49 year old morbidly obese African American female who was initially admitted for complaints of chest pain and shortness of breath with a history of chronic obstructive pulmonary disease and uncontrolled hypertension.  We were asked to evaluate the patient after she was found to have CHF along with chest pain on her admission evaluation.  Chest x-ray completed on admission revealed mild CHF with cephalization of the pulmonary blood flow with pulmonary vascular congestion and fullness in the right hilum.  The patient's cardiac enzymes were cycled and found to be negative x3, however, echocardiogram that was completed on March 26, 2010, demonstrated an EF of 25-30% with grade 1 diastolic dysfunction with PAP moderately increased to 50 mmHg systolic.  As a result of this, it was advised by Dr. Diona Browner that the patient be transferred to [facility] for cardiac catheterization in the setting.  The patient was no longer having complaints of chest pain on evaluation.  The patient describes her chest pain as tightness, constant, associated with shortness of breath and dyspnea on exertion which was progressive for months, but had had increased severity over the last 2 days prior to admission.  The patient was treated initially for hypertension as she was initially admitted with a blood pressure  of 212/110.  She was given sublingual nitroglycerin patch and Vasotec IV.  She was subsequently placed on clonidine and hydralazine, which was then discontinued and placed on Norvasc by primary care physician the next day.  After evaluation of echocardiogram, the patient's Norvasc was discontinued and we placed her on Coreg 6.25 mg b.i.d. along with lisinopril 5 mg p.o. daily.  She was started on chlorthalidone as well and she has begun to diurese with p.o. dosing.  The patient's breathing status and blood pressure has improved significantly.  After results of echocardiogram were completed, the patient was discussed need for cardiac catheterization at length along with risks and benefits.  She verbalized understanding and is willing to proceed with cardiac catheterization, and therefore will be transferred.  Please see our consult note for thorough past medical history and Dr. Remi Deter Connelly Spruell's addendum for more information.  VITAL SIGNS ON DISCHARGE:  Blood pressure 141/93, pulse 70, respirations20, temperature 98, O2 sat 99% on room air.  The patient's weight was 93.9 kg.  LABS ON DISCHARGE:  Sodium 131, potassium 4.2, chloride 95, CO2 29, BUN 10, creatinine 0.67, glucose 176.  PT 12.6, INR 0.92.  Hemoglobin A1c 7.5.  TSH 188.  Hemoglobin 12.4, hematocrit 37.8, white blood cells  9.8, platelets 214.    EKG revealing sinus rhythm with nonspecific T-wave abnormalities noted inferolaterally, which for a change from initial EKG noted on admission revealing just sinus tachycardia.  The patient's ventricular rate is 85 beats per minute.  IMPRESSION: 1. 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 2. Uncontrolled hypertension. 3. Uncontrolled diabetes. 4. Morbid obesity. 5. Medical noncompliance as the patient had not been taking her     medication for greater than 1 year prior to admission.  PLAN: 1. The patient will be transferred to  [facility] for cardiac     catheterization for evaluation of ischemic component for a     decreased EF of 25-30%.  The patient's blood pressure will need to     be better controlled along with blood glucose.  This has been     discussed with Dr. Kari Baars, the patient's primary care     physician and he agrees that the patient will need to be     transferred for catheterization, to be more recommendations for     cardiac catheterization results and hospital course at [facility].     Bettey Mare. Lyman Bishop, NP   ______________________________ Jonelle Sidle, MD    KML/MEDQ  D:  03/27/2010  T:  03/27/2010  Job:  [id number]  cc:   Ramon Dredge L. Juanetta Gosling, M.D. Fax: [phone]  Electronically Signed by Joni Reining NP on 05/10/2010 10:20:23 AM Electronically Signed by Nona Dell MD on 05/15/2010 04:11:26 PM

## 2010-05-26 NOTE — H&P (Signed)
Felicia [name], Felicia [name]            ACCOUNT NO.:  [id number]  MEDICAL RECORD NO.:  [id number]           PATIENT TYPE:  E  LOCATION:  APED                          FACILITY:  [facility]  PHYSICIAN:  Jonelle Sidle, MD DATE OF BIRTH:  November 20, 1960  DATE OF ADMISSION:  04/26/2010 DATE OF DISCHARGE:  04/05/2012LH                             HISTORY & PHYSICAL   PRIMARY CARDIOLOGIST:  Jonelle Sidle, MD  PRIMARY CARE PHYSICIAN:  Oneal Deputy. Juanetta Gosling, MD  REASON FOR ADMISSION:  Chest pain, need for cardiac catheterization.  HISTORY OF PRESENT ILLNESS:  This is a 50 year old female with history of hypertension, COPD who came to the emergency room with complaints of chest pain.  The patient claims that she was started on antihypertensive medications in the past but after her blood pressure was controlled, her medications were discontinued.  She has a history of COPD but has not been seeking regular medical treatment for this or followup. Unfortunately she continues to smoke.  The patient was admitted to [facility] with complaints of chest pain to rule out unstable angina. The patient had a echocardiogram that was completed on day of admission revealing a abnormal EF of 20-25%.  This is a new finding with recurrent chest discomfort.  It was advised that the patient would probably be better served at [facility] and after a cardiac catheterization.  We saw the patient on March 26, 2010 revealing EF of 20-25% with grade 1 diastolic dysfunction with PAP moderately increased at 50 mmHg.  Medications were adjusted prior to transfer to include Coreg 6.25 mg b.i.d. in addition to ACE inhibitor, lisinopril 5 mg daily.  The patient was evaluated further by Dr. Simona Huh who agreed that the patient would be best treated at [facility] for further evaluation of elevated pulmonary pressures.  On day of discharge to transfer, the patient was seen by Dr. Diona Browner.  She had been ruled out for  myocardial infarction by cardiac enzymes, but evidence of acute systolic heart failure with EF of 20-25% per 2-D echo in the setting of uncontrolled hypertension and diabetes necessitated transfer.  The patient will be transferred for left and right heart cath and depending upon coronary anatomy, discussion of need for medication adjustments.  ROS: All systems are reviewed and found to be negative unless listed above.  PAST MEDICAL HISTORY:  Hypertension, medical noncompliance, chronic obstructive pulmonary disease.  CURRENT MEDICATIONS PRIOR TO ADMISSION:  None.  SOCIAL HISTORY:  The patient lives in [place] with her 2 children. She smokes about half a pack of cigarettes a day.  She drinks alcohol socially.  No history of alcohol abuse.  FAMILY HISTORY:  Mother deceased of a stroke.  Father deceased of lung cancer.  No history of diabetes in the family.  CURRENT LABS:  CBC:  White blood cells 11.2, hemoglobin 14.1, hematocrit 43.0, platelets 269,000.  Sodium 135, potassium 3.9, chloride 99, CO2 28, glucose 196, BUN 13, creatinine 0.17, calcium 8.5, troponin 0.04, D- dimer 0.34.  BNP 146.  It is also found that the troponin's were negative x2 prior to discharge in transfer.  Chest x-ray revealing compatible  with mild CHF, cephalization of the pulmonary blood flow and pulmonary vascular congestion, fullness of the right ilium.  Followup PA and lateral ray radiograph is recommended.  No pneumonia.  PHYSICAL EXAMINATION:  VITAL SIGNS:  Prior to discharge, blood pressure 141/93, pulse 70, respirations 20, temperature 98 degrees, O2 sat 99% on room air.  Weight 93.9 kg. GENERAL:  She is awake, alert, oriented.  No acute distress. HEART:  Regular rate and rhythm without murmurs, rubs, or gallops. LUNGS:  Clear to auscultation without wheezes, rales, or rhonchi. ABDOMEN:  Soft, nontender with 2+ bowel sounds. EXTREMITIES:  Negative for edema.  Dorsalis pedis pulses 1+  bilaterally.  Echocardiogram dated March 26, 2010 severely reduced EF of 25-30% with diffuse hypokinesis, grade 1 diastolic dysfunction.  IMPRESSION: 1. 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  Plan is     to transfer to [facility] for cardiac catheterization today right and     left for evaluation of coronary artery disease and pulmonary artery     pressures. 2. Hypertension better controlled on current medications.  Medical     noncompliance with a major issue for her. 3. Diabetes not well controlled.  Her hemoglobin A1c results which     were found to be elevated at 7.5.  The patient again has been     medically noncompliant and will need reinforcement in this issue.  PLAN:  Transfer to [facility] with more recommendations post cardiac catheterization.     Bettey Mare. Lyman Bishop, NP   ______________________________ Jonelle Sidle, MD    KML/MEDQ  D:  05/04/2010  T:  05/04/2010  Job:  [id number]  cc:   Ramon Dredge L. Juanetta Gosling, M.D. Fax: [phone]  Electronically Signed by Joni Reining NP on 05/24/2010 04:31:11 PM Electronically Signed by Nona Dell MD on 05/26/2010 07:31:12 AM

## 2010-06-18 ENCOUNTER — Emergency Department (HOSPITAL_COMMUNITY): Payer: Medicaid Other

## 2010-06-18 ENCOUNTER — Inpatient Hospital Stay (HOSPITAL_COMMUNITY)
Admission: EM | Admit: 2010-06-18 | Discharge: 2010-06-19 | DRG: 313 | Disposition: A | Discharge status: Home / Self Care | Payer: Medicaid Other | Attending: Internal Medicine | Admitting: Internal Medicine

## 2010-06-18 ENCOUNTER — Encounter (HOSPITAL_COMMUNITY): Payer: Self-pay | Admitting: Radiology

## 2010-06-18 DIAGNOSIS — R112 Nausea with vomiting, unspecified: Secondary | ICD-10-CM | POA: Diagnosis present

## 2010-06-18 DIAGNOSIS — J4489 Other specified chronic obstructive pulmonary disease: Secondary | ICD-10-CM | POA: Diagnosis present

## 2010-06-18 DIAGNOSIS — I10 Essential (primary) hypertension: Secondary | ICD-10-CM | POA: Diagnosis present

## 2010-06-18 DIAGNOSIS — R0789 Other chest pain: Principal | ICD-10-CM | POA: Diagnosis present

## 2010-06-18 DIAGNOSIS — I2589 Other forms of chronic ischemic heart disease: Secondary | ICD-10-CM | POA: Diagnosis present

## 2010-06-18 DIAGNOSIS — Z91199 Patient's noncompliance with other medical treatment and regimen due to unspecified reason: Secondary | ICD-10-CM

## 2010-06-18 DIAGNOSIS — E119 Type 2 diabetes mellitus without complications: Secondary | ICD-10-CM | POA: Diagnosis present

## 2010-06-18 DIAGNOSIS — J449 Chronic obstructive pulmonary disease, unspecified: Secondary | ICD-10-CM | POA: Diagnosis present

## 2010-06-18 DIAGNOSIS — R911 Solitary pulmonary nodule: Secondary | ICD-10-CM | POA: Diagnosis present

## 2010-06-18 DIAGNOSIS — Z9119 Patient's noncompliance with other medical treatment and regimen: Secondary | ICD-10-CM

## 2010-06-18 LAB — DIFFERENTIAL
Basophils Absolute: 0 10*3/uL (ref 0.0–0.1)
Basophils Relative: 0 % (ref 0–1)
Eosinophils Absolute: 0.1 10*3/uL (ref 0.0–0.7)
Eosinophils Relative: 1 % (ref 0–5)
Neutrophils Relative %: 66 % (ref 43–77)

## 2010-06-18 LAB — BASIC METABOLIC PANEL
BUN: 14 mg/dL (ref 6–23)
Creatinine, Ser: 0.67 mg/dL (ref 0.4–1.2)
GFR calc non Af Amer: >60 mL/min (ref >60)
Glucose, Bld: 175 mg/dL — ABNORMAL HIGH (ref 70–99)
Potassium: 3.5 mEq/L (ref 3.5–5.1)

## 2010-06-18 LAB — GLUCOSE, CAPILLARY
Glucose-Capillary: 166 mg/dL — ABNORMAL HIGH (ref 70–99)
Glucose-Capillary: 214 mg/dL — ABNORMAL HIGH (ref 70–99)

## 2010-06-18 LAB — CBC
Platelets: 248 10*3/uL (ref 150–400)
RBC: 4.05 MIL/uL (ref 3.87–5.11)
RDW: 15.7 % — ABNORMAL HIGH (ref 11.5–15.5)
WBC: 9.9 10*3/uL (ref 4.0–10.5)

## 2010-06-18 LAB — CK TOTAL AND CKMB (NOT AT ARMC)
Relative Index: 2.3 (ref 0.0–2.5)
Total CK: 111 U/L (ref 7–177)

## 2010-06-18 LAB — TROPONIN I: Troponin I: <0.3 ng/mL (ref <0.30)

## 2010-06-18 MED ORDER — IOHEXOL 350 MG/ML SOLN
100.0000 mL | Freq: Once | INTRAVENOUS | Status: AC | PRN
  Administered 2010-06-18: 100 mL via INTRAVENOUS

## 2010-06-19 ENCOUNTER — Inpatient Hospital Stay (HOSPITAL_COMMUNITY): Payer: Medicaid Other

## 2010-06-19 LAB — GLUCOSE, CAPILLARY
Glucose-Capillary: 246 mg/dL — ABNORMAL HIGH (ref 70–99)
Glucose-Capillary: 191 mg/dL — ABNORMAL HIGH (ref 70–99)

## 2010-06-19 LAB — RAPID URINE DRUG SCREEN, HOSP PERFORMED
Amphetamines: NOT DETECTED
Barbiturates: NOT DETECTED
Benzodiazepines: NOT DETECTED
Opiates: NOT DETECTED

## 2010-06-19 LAB — HEPATIC FUNCTION PANEL
ALT: 12 U/L (ref 0–35)
AST: 13 U/L (ref 0–37)
Bilirubin, Direct: 0.1 mg/dL (ref 0.0–0.3)
Indirect Bilirubin: 0.2 mg/dL — ABNORMAL LOW (ref 0.3–0.9)
Total Bilirubin: 0.3 mg/dL (ref 0.3–1.2)

## 2010-06-19 LAB — LIPID PANEL
HDL: 42 mg/dL (ref >39)
LDL Cholesterol: 99 mg/dL (ref 0–99)
Total CHOL/HDL Ratio: 4.1 RATIO
Triglycerides: 158 mg/dL — ABNORMAL HIGH (ref <150)

## 2010-06-19 LAB — CARDIAC PANEL(CRET KIN+CKTOT+MB+TROPI)
CK, MB: 2.8 ng/mL (ref 0.3–4.0)
CK, MB: 2.9 ng/mL (ref 0.3–4.0)
Relative Index: 2.2 (ref 0.0–2.5)
Relative Index: INVALID (ref 0.0–2.5)
Total CK: 106 U/L (ref 7–177)
Troponin I: <0.3 ng/mL (ref <0.30)
Troponin I: <0.3 ng/mL (ref <0.30)

## 2010-06-19 NOTE — Discharge Summary (Signed)
NAMETHANA, RAMP            ACCOUNT NO.:  [id number]  MEDICAL RECORD NO.:  [id number]           PATIENT TYPE:  I  LOCATION:  A203                          FACILITY:  [facility]  PHYSICIAN:  Jeoffrey Massed, MD    DATE OF BIRTH:  Dec 08, 1960  DATE OF ADMISSION:  06/18/2010 DATE OF DISCHARGE:  05/28/2012LH                         DISCHARGE SUMMARY-REFERRING   PRIMARY CARE PRACTITIONER:  [facility].  PRIMARY CARDIOLOGIST:  [employer] [facility] at [facility].  PRIMARY DISCHARGE DIAGNOSES: 1. Atypical chest pain, possibly secondary to gastrointestinal     etiology. 2. Noncompliance to medications. 3. History of nonischemic cardiomyopathy, currently compensated. 4. Diabetes. 5. Uncontrolled hypertension. 6. History of chronic obstructive pulmonary disease, currently stable. 7. Nausea and vomiting, probably gastrointestinal related, now     resolved.  The patient tolerating regular diet. 8. Small right middle lobe nodule, will need repeat CT scan of the     chest in around 6 months time period.  DISCHARGE MEDICATIONS: 1. Omeprazole 40 mg 1 tablet p.o. daily. 2. Lisinopril 5 mg 1 tablet daily. 3. Aspirin 81 mg 1 tablet daily. 4. Benadryl 25 mg 1 tablet daily at bedtime p.r.n. 5. Coreg 6.25 mg 1 tablet twice daily. 6. Chlorthalidone 25 mg 1 tablet daily. 7. Metformin 500 mg 1 tablet twice daily. 8. Tylenol 325 mg two tablets p.o. q.4 h p.r.n.  CONSULTATIONS:  None.  HISTORY OF PRESENT ILLNESS:  The patient is a 50 year old female, who presented to the ED initially for chest pain.  For further details, please see the history and physical that was dictated by Dr. Onalee Hua on admission.  CONSULTATIONS:  None.  PERTINENT LABORATORY DATA: 1. Cardiac enzymes were cycled and these were negative. 2. Uterine drug screen was negative. 3. Lipase was 24. 4. AST was 13, ALT was 12.  PERTINENT RADIOLOGICAL STUDIES: 1. CT angiogram of the chest was negative for  pulmonary embolism as     well as negative for aortic dissection or aneurysm.  It showed mild     right lower lobe atelectasis, small right middle lobe nodule     measuring 5 x 7 mm. 2. Acute abdominal series showed unremarkable bowel gas pattern.  No     free intraabdominal air.  BRIEF HOSPITAL COURSE: 1. Initially, the patient presented with chest pain.  She described     the pain as dull and going to her back.  A CT angiogram of the     chest was done in the ED, which is negative for both a pulmonary     embolism and an aortic dissection.  She was admitted to the     telemetry floor.  Cardiac enzymes were cycled and these were     negative as well.  Please note that the patient has had a recent     cardiac catheterization in March of this year, which did not show     any significant coronary artery disease.  This patient has been     noncompliant to her medications and has not been taking any of her     prescribed medications for a month if not longer.  Compliance has  been counseled extensively by me and I have given her a set of     prescriptions for usual medication that she was supposed to be on.     She has also been asked to follow up with Dr. Dietrich Pates or Dr.     Diona Browner from [facility] in [place]. 2. Nausea with vomiting.  Upon presentation to the hospital, the     patient only had chest pain, however, upon being admitted, the     patient had 2 to 3 episodes of nausea and vomiting.  She was     briefly kept n.p.o.  She was given supportive care and her diet was     slowly advanced.  She has now tolerated a regular diet and is     anxious to go home.  She will be discharged on omeprazole for     another 30 days.  If she continues to have pain and nausea and     vomiting, she will need to see her primary doctor for possible GI     referral.  However, at this time, we are going to give her a trial     of 6 weeks of proton pump inhibitor therapy. 3. Nonischemic  cardiomyopathy.  This is compensated.  The patient is     to continue taking Coreg, lisinopril, and chlorthalidone. 4. Uncontrolled hypertension.  This is secondary to noncompliance.     The patient is to take these antihypertensive medications as noted     above. 5. Diabetes.  Her HbA1c is currently pending.  However, her last HbA1c     in March was 7.5.  She is to continue taking her metformin. 6. Noncompliance to medication.  The patient has been counseled     extensively by me regarding the importance of medication compliance     and also compliance to follow up. 7. Disposition.  It is felt that this patient is stable to be     discharged home.  Her cardiac enzymes are negative and she has now     tolerated a regular diet.She is chest pain free as well.  FOLLOWUP INSTRUCTIONS: 1. The patient has a follow up appointment at the [facility] on June 28, 2010 at 8:30 a.m. 2. She is to follow up with [facility] here in [place] into     three week's time as well.  She is to call and make an appointment. 3. She is to keep all follow up appointments and continue taking her     medications as prescribed.  Counseling has been done as noted     above.  Total time spent coordinating discharge is 25 minutes.     Jeoffrey Massed, MD     SG/MEDQ  D:  06/19/2010  T:  06/19/2010  Job:  [id number]  cc:   Jonelle Sidle, MD [address] [place], [place] [address]  Gerrit Friends. Dietrich Pates, MD, [facility] [address] [place], [place] [address]  [facility] Department  Electronically Signed by Jeoffrey Massed  on 06/19/2010 06:07:11 PM

## 2010-06-22 NOTE — H&P (Signed)
[name], [name]            ACCOUNT NO.:  [id number]  MEDICAL RECORD NO.:  [id number]           PATIENT TYPE:  I  LOCATION:  A203                          FACILITY:  [facility]  PHYSICIAN:  Tarry Kos, MD       DATE OF BIRTH:  [birth year]  DATE OF ADMISSION:  06/18/2010 DATE OF DISCHARGE:  05/28/2012LH                             HISTORY & PHYSICAL   CHIEF COMPLAINT:  Chest pain since this morning.  HISTORY OF PRESENT ILLNESS:  [name] is a pleasant 50 year old female with a history of hypertension, diabetes, history of COPD who presents to [facility] because she was having substernal chest pain radiating to [facility] back since this morning.  She came to [facility] ED and was given routine treatment for chest pain, and she had some relief of [facility] substernal chest pain.  However, she is now complaining of it returning. She does not really describe it as a sharp stabbing pain. She just says it is a constant dull pain but it is going towards [facility] back.  She denies any nausea or vomiting with it.  She denies any abdominal pain.  She denies any radiation of [facility] pain elsewhere besides towards [facility] back.  She is pretty hypertensive in [facility] ED right now with a systolic blood pressure of 190.  She did have a heart catheterization in May 28, 2010, which revealed [facility] EF of 30% with nonischemic cardiomyopathy with elevated left ventricular end diastolic pressure and right atrial pressures, but normal pulmonary capillary wedge pressures. She also has a history of poor medical compliance.  REVIEW OF SYSTEMS:  Otherwise negative.  She has not had any fevers recent illnesses.  There is no relation to food with this pain but again it just started this morning.  She denies having chest pain on a frequent basis.  Denies any shortness of breath, orthopnea or PND either.  PAST MEDICAL HISTORY:  As above; 1. Nonischemic cardiomyopathy. 2. Diabetes. 3. Hypertension. 4. History of COPD, status post  BTL.  ALLERGIES:  None.  CURRENT MEDICATIONS: 1. Lisinopril 10 mg daily. 2. Metformin 500 mg twice. 3. Coreg 6.25 mg twice a day. 4. Chlorthalidone 25 mg daily.  PHYSICAL EXAMINATION:  VITAL SIGNS:  Temperature 98.6, blood pressure 188/127, respirations 20, 95% O2 sats on room air. GENERAL:  She is alert and oriented x4.  No apparent distress, cooperative and friendly. HEENT:  Extraocular muscles are intact.  Oropharynx clear.  Mucous membranes are moist. NECK:  No JVD.  No carotid bruits. COR:  Regular rate and rhythm without murmurs or gallops. CHEST:  Clear to auscultation bilaterally.  No wheezing, rhonchi, or rales. ABDOMEN:  Soft, nontender, nondistended.  Positive bowel sounds.  No hepatosplenomegaly. EXTREMITIES:  No clubbing, cyanosis, or edema. PSYCHIATRIC:  Normal affect. NEUROLOGICAL:  No focal neurologic deficits. SKIN:  No rashes.  LABORATORY DATA:  Chest x-ray is negative.  Cardiac enzymes are normal. BMP is basically normal with a glucose of 175.  CBC is normal.  A 12- lead EKG normal sinus rhythm with prolonged QT of 467 ms.  No acute ST or T wave changes.  ASSESSMENT/PLANS: 1.  This is a 50 year old female with substernal chest pain that     radiates to [facility] back. 2. Chest pain concerning for possible aortic dissection.  She is     certainly at a high risk due to her uncontrolled hypertension and     history of medical noncompliance.  I am going to order a stat CTA     of her chest to make sure she does not have any dissection and if     this is abnormal obviously she will need to be transferred to [facility].  This will be done tonight.  We will check a 2-D echo also, a     fasting lipid panel in [facility] morning, rule out serial cardiac     enzymes, urine drug screen and TSH. 3. Nonischemic cardiomyopathy.  We will continue her Coreg, and she     does not have any current evidence of heart failure. 4. Non-insulin dependent diabetes.  We have placed her on  sliding     scale insulin overnight. 5. Uncontrolled high blood pressure.  Again, we will place her on     Coreg and give hydralazine order as needed.  Adjust medications     appropriately.  [facility] patient is a full code.  Further recommendation     pending on overall hospital course.                                           ______________________________ Tarry Kos, MD     RD/MEDQ  D:  06/18/2010  T:  06/19/2010  Job:  [id number]  Electronically Signed by Tarry Kos MD on 06/22/2010 11:51:34 AM

## 2010-07-13 ENCOUNTER — Ambulatory Visit: Payer: Self-pay | Admitting: Cardiology

## 2010-07-13 ENCOUNTER — Ambulatory Visit: Payer: Self-pay | Admitting: Adult Health

## 2010-10-19 LAB — CULTURE, ROUTINE-ABSCESS

## 2010-10-24 ENCOUNTER — Emergency Department (HOSPITAL_COMMUNITY): Payer: Self-pay

## 2010-10-24 ENCOUNTER — Encounter (HOSPITAL_COMMUNITY): Payer: Self-pay | Admitting: Emergency Medicine

## 2010-10-24 ENCOUNTER — Other Ambulatory Visit: Payer: Self-pay

## 2010-10-24 ENCOUNTER — Emergency Department (HOSPITAL_COMMUNITY)
Admission: EM | Admit: 2010-10-24 | Discharge: 2010-10-24 | Disposition: A | Discharge status: Home / Self Care | Payer: Self-pay | Attending: Emergency Medicine | Admitting: Emergency Medicine

## 2010-10-24 DIAGNOSIS — R059 Cough, unspecified: Secondary | ICD-10-CM | POA: Insufficient documentation

## 2010-10-24 DIAGNOSIS — R05 Cough: Secondary | ICD-10-CM | POA: Insufficient documentation

## 2010-10-24 DIAGNOSIS — F17200 Nicotine dependence, unspecified, uncomplicated: Secondary | ICD-10-CM | POA: Insufficient documentation

## 2010-10-24 DIAGNOSIS — Z79899 Other long term (current) drug therapy: Secondary | ICD-10-CM | POA: Insufficient documentation

## 2010-10-24 DIAGNOSIS — J40 Bronchitis, not specified as acute or chronic: Secondary | ICD-10-CM | POA: Insufficient documentation

## 2010-10-24 DIAGNOSIS — R000 Tachycardia, unspecified: Secondary | ICD-10-CM | POA: Insufficient documentation

## 2010-10-24 DIAGNOSIS — E119 Type 2 diabetes mellitus without complications: Secondary | ICD-10-CM | POA: Insufficient documentation

## 2010-10-24 DIAGNOSIS — R6889 Other general symptoms and signs: Secondary | ICD-10-CM | POA: Insufficient documentation

## 2010-10-24 DIAGNOSIS — R062 Wheezing: Secondary | ICD-10-CM | POA: Insufficient documentation

## 2010-10-24 LAB — BASIC METABOLIC PANEL
Calcium: 9.4 mg/dL (ref 8.4–10.5)
Creatinine, Ser: 0.71 mg/dL (ref 0.50–1.10)
GFR calc Af Amer: >90 mL/min (ref >90)
GFR calc non Af Amer: >90 mL/min (ref >90)
Sodium: 136 mEq/L (ref 135–145)

## 2010-10-24 LAB — D-DIMER, QUANTITATIVE: D-Dimer, Quant: 0.23 ug/mL-FEU (ref 0.00–0.48)

## 2010-10-24 MED ORDER — ALBUTEROL SULFATE (5 MG/ML) 0.5% IN NEBU
2.5000 mg | INHALATION_SOLUTION | RESPIRATORY_TRACT | Status: DC
  Administered 2010-10-24: 2.5 mg via RESPIRATORY_TRACT
  Filled 2010-10-24: qty 0.5

## 2010-10-24 MED ORDER — AZITHROMYCIN 250 MG PO TABS
ORAL_TABLET | ORAL | Status: AC
Start: 2010-10-24 — End: 2010-10-29

## 2010-10-24 MED ORDER — CHLORTHALIDONE 25 MG PO TABS: 25.0000 mg | ORAL_TABLET | Freq: Every day | ORAL | Status: DC

## 2010-10-24 MED ORDER — ALBUTEROL SULFATE HFA 108 (90 BASE) MCG/ACT IN AERS
2.0000 | INHALATION_SPRAY | Freq: Once | RESPIRATORY_TRACT | Status: AC
  Administered 2010-10-24: 2 via RESPIRATORY_TRACT
  Filled 2010-10-24: qty 6.7

## 2010-10-24 MED ORDER — LISINOPRIL 10 MG PO TABS: 10.0000 mg | ORAL_TABLET | Freq: Every day | ORAL | Status: DC

## 2010-10-24 MED ORDER — CARVEDILOL 6.25 MG PO TABS: 6.2500 mg | ORAL_TABLET | Freq: Two times a day (BID) | ORAL | Status: DC

## 2010-10-24 MED ORDER — AZITHROMYCIN 250 MG PO TABS
500.0000 mg | ORAL_TABLET | Freq: Once | ORAL | Status: AC
  Administered 2010-10-24: 500 mg via ORAL
  Filled 2010-10-24: qty 2

## 2010-10-24 MED ORDER — IPRATROPIUM BROMIDE 0.02 % IN SOLN
0.5000 mg | Freq: Once | RESPIRATORY_TRACT | Status: AC
  Administered 2010-10-24: 0.5 mg via RESPIRATORY_TRACT
  Filled 2010-10-24: qty 2.5

## 2010-10-24 MED ORDER — PROMETHAZINE-CODEINE 6.25-10 MG/5ML PO SYRP
5.0000 mL | ORAL_SOLUTION | Freq: Once | ORAL | Status: AC
  Administered 2010-10-24: 5 mL via ORAL
  Filled 2010-10-24: qty 5

## 2010-10-24 MED ORDER — METFORMIN HCL 500 MG PO TABS: 500.0000 mg | ORAL_TABLET | Freq: Two times a day (BID) | ORAL | Status: DC

## 2010-10-24 NOTE — ED Notes (Signed)
Pt c/o cough and congestion. Pt states she has seasonal allergies and feels like the cool weather has made her congested.

## 2010-10-24 NOTE — ED Provider Notes (Signed)
Medical screening examination/treatment/procedure(s) were conducted as a shared visit with non-physician practitioner(s) and myself.  I personally evaluated the patient during the encounter  Morbidly obese female hx. Of dm and copd. C/o intermittent cough and sob.  Cp only with cough.  No fever no chills, no leg pain or swelling.  pe obese. Tachycardic. cxr no pneumonia. ddimer neg.  sxs improved with nebs.  Will release with rxs for med refill b/c she had run out.      Nicholes Stairs, MD 10/24/10 1320

## 2010-10-24 NOTE — ED Notes (Signed)
Med given. Was not available in [facility] or ed narrator to chart it was given. Nad. Awaiting xray.

## 2010-10-24 NOTE — ED Notes (Signed)
Pt c/o coughing, head stuffiness, head pain and sneezing x 3 days.

## 2010-10-24 NOTE — ED Provider Notes (Signed)
History     CSN: [id number] Arrival date & time: 10/24/2010  9:40 AM  Chief Complaint  Patient presents with  . Cough    (Consider location/radiation/quality/duration/timing/severity/associated sxs/prior treatment) Patient is a 50 y.o. female presenting with URI. The history is provided by the patient.  URI The primary symptoms include cough and wheezing. Primary symptoms do not include fever, headaches, sore throat, abdominal pain, nausea, arthralgias or rash. Primary symptoms comment: She reports facial pain and nasal congestion.  Cough is sometimes productive of thick white sputum. Episode onset: 4 days ago. This is a new problem. The problem has been gradually worsening.  Symptoms associated with the illness include facial pain, sinus pressure, congestion and rhinorrhea. The illness is not associated with chills or plugged ear sensation. The following treatments were addressed: Acetaminophen: she took dayquill yesterday which did not relieve her symptoms. Risk factors for severe complications from URI include chronic respiratory disease and diabetes mellitus (She reports having run out of all of her prescriptions one month ago.).    Past Medical History  Diagnosis Date  . CHF (congestive heart failure)     minamal  . Cardiomyopathy   . Hypertension   . Diabetes mellitus   . Normal echocardiogram     LVEF 25-30% 03/27/2010  . COPD (chronic obstructive pulmonary disease)     Past Surgical History  Procedure Date  . Cardiac catheterization     Family History  Problem Relation Age of Onset  . Stroke Mother   . Lung cancer Father     History  Substance Use Topics  . Smoking status: Current Everyday Smoker -- 1.5 packs/day    Types: Cigarettes  . Smokeless tobacco: Not on file  . Alcohol Use: Yes    OB History    Grav Para Term Preterm Abortions TAB SAB Ect Mult Living                  Review of Systems  Constitutional: Negative for fever and chills.  HENT:  Positive for congestion, rhinorrhea, sneezing and sinus pressure. Negative for sore throat, neck pain and neck stiffness.   Eyes: Negative.   Respiratory: Positive for cough and wheezing. Negative for chest tightness and shortness of breath.   Cardiovascular: Negative for chest pain, palpitations and leg swelling.  Gastrointestinal: Negative for nausea and abdominal pain.  Genitourinary: Negative.   Musculoskeletal: Negative for joint swelling and arthralgias.  Skin: Negative.  Negative for rash and wound.  Neurological: Negative for dizziness, weakness, light-headedness, numbness and headaches.  Hematological: Negative.   Psychiatric/Behavioral: Negative.     Allergies  Review of patient's allergies indicates no known allergies.  Home Medications   Current Outpatient Rx  Name Route Sig Dispense Refill  . ACETAMINOPHEN 325 MG PO TABS Oral Take 650 mg by mouth every 6 (six) hours as needed.      . ASPIRIN 81 MG PO TABS Oral Take 81 mg by mouth daily.      Marland Kitchen CARVEDILOL 6.25 MG PO TABS Oral Take 6.25 mg by mouth 2 (two) times daily with a meal.      . CHLORTHALIDONE 25 MG PO TABS Oral Take 25 mg by mouth daily.      Marland Kitchen DIPHENHYDRAMINE HCL 25 MG PO TABS Oral Take 25 mg by mouth every 6 (six) hours as needed.      Marland Kitchen LISINOPRIL 5 MG PO TABS Oral Take 10 mg by mouth daily.     Marland Kitchen METFORMIN HCL 500 MG PO TABS  Oral Take 500 mg by mouth 2 (two) times daily with a meal.       BP 175/122  Pulse 123  Temp(Src) 98.9 F (37.2 C) (Oral)  Resp 18  Ht 4\' 11"  (1.499 m)  Wt 198 lb (89.812 kg)  BMI 39.99 kg/m2  SpO2 93%  LMP 08/02/2010  Physical Exam  Nursing note and vitals reviewed. Constitutional: She is oriented to person, place, and time. She appears well-developed and well-nourished.  HENT:  Head: Normocephalic and atraumatic.  Right Ear: External ear normal.  Left Ear: External ear normal.  Eyes: Conjunctivae are normal.  Neck: Normal range of motion. No JVD present.  Cardiovascular:  Regular rhythm, normal heart sounds and intact distal pulses.  Tachycardia present.   Pulmonary/Chest: Effort normal. No accessory muscle usage or stridor. Not tachypneic. No respiratory distress. She has wheezes. She has no rhonchi. She has no rales.  Abdominal: Soft. Bowel sounds are normal. There is no tenderness.  Musculoskeletal: Normal range of motion.  Lymphadenopathy:    She has no cervical adenopathy.  Neurological: She is alert and oriented to person, place, and time.  Skin: Skin is warm and dry.  Psychiatric: She has a normal mood and affect.    ED Course  Procedures (including critical care time)       MDM  Bronchitis with bronchospasm,  Sinusitis. Medication noncompliance with hypertension.  HTN meds and metformin refilled.  Zithromax.  Albuterol mdi given.   Tachycardia of unclear etiology with negative d dimer.     Date: 10/24/2010  Rate: 106  Rhythm: sinus tachycardia  QRS Axis: normal  Intervals: normal  ST/T Wave abnormalities: normal  Conduction Disutrbances:none  Narrative Interpretation:   Old EKG Reviewed: unchanged          Candis Musa, PA 10/24/10 1258

## 2011-02-07 ENCOUNTER — Emergency Department (HOSPITAL_COMMUNITY): Payer: Self-pay

## 2011-02-07 ENCOUNTER — Encounter (HOSPITAL_COMMUNITY): Payer: Self-pay | Admitting: *Deleted

## 2011-02-07 ENCOUNTER — Emergency Department (HOSPITAL_COMMUNITY)
Admission: EM | Admit: 2011-02-07 | Discharge: 2011-02-07 | Disposition: A | Discharge status: Home / Self Care | Payer: Self-pay | Attending: Emergency Medicine | Admitting: Emergency Medicine

## 2011-02-07 DIAGNOSIS — M25569 Pain in unspecified knee: Secondary | ICD-10-CM | POA: Insufficient documentation

## 2011-02-07 DIAGNOSIS — J449 Chronic obstructive pulmonary disease, unspecified: Secondary | ICD-10-CM | POA: Insufficient documentation

## 2011-02-07 DIAGNOSIS — J4489 Other specified chronic obstructive pulmonary disease: Secondary | ICD-10-CM | POA: Insufficient documentation

## 2011-02-07 DIAGNOSIS — Z79899 Other long term (current) drug therapy: Secondary | ICD-10-CM | POA: Insufficient documentation

## 2011-02-07 DIAGNOSIS — M5430 Sciatica, unspecified side: Secondary | ICD-10-CM | POA: Insufficient documentation

## 2011-02-07 DIAGNOSIS — M545 Low back pain, unspecified: Secondary | ICD-10-CM | POA: Insufficient documentation

## 2011-02-07 DIAGNOSIS — M79609 Pain in unspecified limb: Secondary | ICD-10-CM | POA: Insufficient documentation

## 2011-02-07 DIAGNOSIS — R269 Unspecified abnormalities of gait and mobility: Secondary | ICD-10-CM | POA: Insufficient documentation

## 2011-02-07 DIAGNOSIS — I10 Essential (primary) hypertension: Secondary | ICD-10-CM | POA: Insufficient documentation

## 2011-02-07 DIAGNOSIS — E119 Type 2 diabetes mellitus without complications: Secondary | ICD-10-CM | POA: Insufficient documentation

## 2011-02-07 DIAGNOSIS — Y92009 Unspecified place in unspecified non-institutional (private) residence as the place of occurrence of the external cause: Secondary | ICD-10-CM | POA: Insufficient documentation

## 2011-02-07 DIAGNOSIS — W010XXA Fall on same level from slipping, tripping and stumbling without subsequent striking against object, initial encounter: Secondary | ICD-10-CM | POA: Insufficient documentation

## 2011-02-07 DIAGNOSIS — I509 Heart failure, unspecified: Secondary | ICD-10-CM | POA: Insufficient documentation

## 2011-02-07 MED ORDER — METHOCARBAMOL 500 MG PO TABS: ORAL_TABLET | ORAL | Status: DC

## 2011-02-07 MED ORDER — HYDROCODONE-ACETAMINOPHEN 5-325 MG PO TABS
1.0000 | ORAL_TABLET | Freq: Once | ORAL | Status: AC
  Administered 2011-02-07: 1 via ORAL
  Filled 2011-02-07: qty 1

## 2011-02-07 MED ORDER — HYDROCODONE-ACETAMINOPHEN 5-325 MG PO TABS: ORAL_TABLET | ORAL | Status: AC

## 2011-02-07 NOTE — ED Notes (Signed)
Pt has hx of chronic back issues, states that she slipped on wet steps Tuesday night, pain to lower back area, worse on right side and radiates down right leg, pt has problems walking due to the pain

## 2011-02-10 NOTE — ED Provider Notes (Signed)
History     CSN: [id number]  Arrival date & time 02/07/11  1124   First MD Initiated Contact with Patient 02/07/11 1143      Chief Complaint  Patient presents with  . Back Pain    (Consider location/radiation/quality/duration/timing/severity/associated sxs/prior treatment) Patient is a 51 y.o. female presenting with back pain. The history is provided by the patient. No language interpreter was used.  Back Pain  This is a recurrent problem. The current episode started 2 days ago. The problem occurs constantly. The problem has been gradually worsening. The pain is associated with twisting and falling (pateint slipped on wet steps at his home.  ). The pain is present in the lumbar spine. The quality of the pain is described as shooting and aching. The pain radiates to the right thigh, right knee and right foot. The pain is moderate. The symptoms are aggravated by bending, twisting and certain positions. The pain is the same all the time. Associated symptoms include leg pain. Pertinent negatives include no fever, no numbness, no abdominal pain, no abdominal swelling, no bowel incontinence, no perianal numbness, no bladder incontinence, no dysuria, no pelvic pain, no paresthesias, no paresis, no tingling and no weakness. She has tried nothing for the symptoms. The treatment provided no relief.    Past Medical History  Diagnosis Date  . CHF (congestive heart failure)     minamal  . Cardiomyopathy   . Hypertension   . Diabetes mellitus   . Normal echocardiogram     LVEF 25-30% 03/27/2010  . COPD (chronic obstructive pulmonary disease)     Past Surgical History  Procedure Date  . Cardiac catheterization     Family History  Problem Relation Age of Onset  . Stroke Mother   . Lung cancer Father     History  Substance Use Topics  . Smoking status: Current Everyday Smoker -- 1.5 packs/day    Types: Cigarettes  . Smokeless tobacco: Not on file  . Alcohol Use: Yes    OB History    Grav Para Term Preterm Abortions TAB SAB Ect Mult Living                  Review of Systems  Constitutional: Negative for fever.  Gastrointestinal: Negative for vomiting, abdominal pain, constipation and bowel incontinence.  Genitourinary: Negative for bladder incontinence, dysuria, hematuria, decreased urine volume, difficulty urinating and pelvic pain.  Musculoskeletal: Positive for back pain and gait problem. Negative for joint swelling and arthralgias.  Skin: Negative.   Neurological: Negative for dizziness, tingling, weakness, numbness and paresthesias.  All other systems reviewed and are negative.    Allergies  Latex  Home Medications   Current Outpatient Rx  Name Route Sig Dispense Refill  . ACETAMINOPHEN 500 MG PO TABS Oral Take 1,000-1,500 mg by mouth daily as needed. Pain     . ASPIRIN 81 MG PO TABS Oral Take 81 mg by mouth daily.      Marland Kitchen CARVEDILOL 6.25 MG PO TABS Oral Take 1 tablet (6.25 mg total) by mouth 2 (two) times daily with a meal. 60 tablet 0  . CHLORTHALIDONE 25 MG PO TABS Oral Take 1 tablet (25 mg total) by mouth daily. 30 tablet 0  . DIPHENHYDRAMINE HCL 25 MG PO TABS Oral Take 25 mg by mouth every 6 (six) hours as needed. Allergies    . HYDROCODONE-ACETAMINOPHEN 5-325 MG PO TABS  Take one-two tabs po q 4-6 hrs prn pain 20 tablet 0  . LISINOPRIL 10  MG PO TABS Oral Take 1 tablet (10 mg total) by mouth daily. 30 tablet 0  . METFORMIN HCL 500 MG PO TABS Oral Take 1 tablet (500 mg total) by mouth 2 (two) times daily with a meal. 60 tablet 0  . METHOCARBAMOL 500 MG PO TABS  Take two tablets po TID prn muscle spasms 42 tablet 0  . NITE TIME COLD PO Oral Take 1 tablet by mouth at bedtime as needed. Cold Symptoms       BP 159/91  Pulse 97  Temp 98 F (36.7 C)  Resp 21  Ht 4\' 11"  (1.499 m)  Wt 196 lb (88.905 kg)  BMI 39.59 kg/m2  SpO2 96%  LMP 08/02/2010  Physical Exam  Nursing note and vitals reviewed. Constitutional: She is oriented to person, place, and  time. She appears well-developed and well-nourished. No distress.  HENT:  Head: Normocephalic and atraumatic.  Mouth/Throat: Oropharynx is clear and moist.  Neck: Normal range of motion. Neck supple.  Cardiovascular: Normal rate, regular rhythm and normal heart sounds.   Pulmonary/Chest: Effort normal and breath sounds normal. No respiratory distress. She exhibits no tenderness.  Abdominal: Soft. She exhibits no distension. There is no tenderness.  Musculoskeletal: Normal range of motion. She exhibits no tenderness.       Lumbar back: She exhibits tenderness and pain. She exhibits normal range of motion, no bony tenderness, no swelling, no edema, no deformity, no laceration and normal pulse.       Back:  Lymphadenopathy:    She has no cervical adenopathy.  Neurological: She is alert and oriented to person, place, and time. No cranial nerve deficit or sensory deficit. She exhibits normal muscle tone. Coordination normal.  Reflex Scores:      Patellar reflexes are 2+ on the right side and 2+ on the left side.      Achilles reflexes are 2+ on the right side and 2+ on the left side. Skin: Skin is warm and dry.    ED Course  Procedures (including critical care time)  Dg Lumbar Spine Complete  02/07/2011  *RADIOLOGY REPORT*  Clinical Data: Larey Seat down two steps, low back pain radiating down right leg  LUMBAR SPINE - COMPLETE 4+ VIEW  Comparison: None  Findings: Osseous demineralization. Five non-rib bearing lumbar vertebrae. Minimal disc space narrowing at L4-L5. Vertebral body and remaining disc space heights maintained. Early degenerative disc disease changes T12-L1. No acute fracture, subluxation or bone destruction. No spondylolysis. SI joints symmetric. Scattered atherosclerotic calcifications and pelvic phleboliths.  IMPRESSION: No acute bony abnormalities.  Original Report Authenticated By: Lollie Marrow, M.D.    1. Sciatica       MDM    Patient is ambulatory, no focal neuro  deficits.  ttp of the lumbar paraspinal muscles and right SI joint space.    Patient / Family / Caregiver understand and agree with initial ED impression and plan with expectations set for ED visit. Pt stable in ED with no significant deterioration in condition.       Keili Hasten L. [place], [place] 02/10/11 2113

## 2011-02-14 NOTE — ED Provider Notes (Signed)
Medical screening examination/treatment/procedure(s) were performed by non-physician practitioner and as supervising physician I was immediately available for consultation/collaboration.  Wenceslao Loper, MD 02/14/11 0059 

## 2011-03-29 ENCOUNTER — Encounter (HOSPITAL_COMMUNITY): Payer: Self-pay

## 2011-03-29 ENCOUNTER — Emergency Department (HOSPITAL_COMMUNITY)
Admission: EM | Admit: 2011-03-29 | Discharge: 2011-03-29 | Disposition: A | Discharge status: Home / Self Care | Payer: Self-pay | Attending: Emergency Medicine | Admitting: Emergency Medicine

## 2011-03-29 DIAGNOSIS — I509 Heart failure, unspecified: Secondary | ICD-10-CM | POA: Insufficient documentation

## 2011-03-29 DIAGNOSIS — F17200 Nicotine dependence, unspecified, uncomplicated: Secondary | ICD-10-CM | POA: Insufficient documentation

## 2011-03-29 DIAGNOSIS — J4489 Other specified chronic obstructive pulmonary disease: Secondary | ICD-10-CM | POA: Insufficient documentation

## 2011-03-29 DIAGNOSIS — N898 Other specified noninflammatory disorders of vagina: Secondary | ICD-10-CM

## 2011-03-29 DIAGNOSIS — I428 Other cardiomyopathies: Secondary | ICD-10-CM | POA: Insufficient documentation

## 2011-03-29 DIAGNOSIS — J449 Chronic obstructive pulmonary disease, unspecified: Secondary | ICD-10-CM | POA: Insufficient documentation

## 2011-03-29 DIAGNOSIS — N899 Noninflammatory disorder of vagina, unspecified: Secondary | ICD-10-CM | POA: Insufficient documentation

## 2011-03-29 DIAGNOSIS — E119 Type 2 diabetes mellitus without complications: Secondary | ICD-10-CM | POA: Insufficient documentation

## 2011-03-29 DIAGNOSIS — R739 Hyperglycemia, unspecified: Secondary | ICD-10-CM

## 2011-03-29 LAB — URINE MICROSCOPIC-ADD ON

## 2011-03-29 LAB — GLUCOSE, CAPILLARY: Glucose-Capillary: 314 mg/dL — ABNORMAL HIGH (ref 70–99)

## 2011-03-29 LAB — URINALYSIS, ROUTINE W REFLEX MICROSCOPIC
Glucose, UA: >1000 mg/dL — AB
Hgb urine dipstick: NEGATIVE
Specific Gravity, Urine: 1.015 (ref 1.005–1.030)
pH: 5.5 (ref 5.0–8.0)

## 2011-03-29 MED ORDER — INSULIN REGULAR HUMAN 100 UNIT/ML IJ SOLN
6.0000 [IU] | Freq: Once | INTRAMUSCULAR | Status: AC
  Administered 2011-03-29: 6 [IU] via SUBCUTANEOUS
  Filled 2011-03-29: qty 0.06

## 2011-03-29 MED ORDER — FLUCONAZOLE 150 MG PO TABS: 150.0000 mg | ORAL_TABLET | Freq: Every day | ORAL | Status: AC

## 2011-03-29 NOTE — ED Notes (Signed)
"  I think I have a urine infection, because I'm itching so much".  Alert, NAD

## 2011-03-29 NOTE — ED Notes (Signed)
Complain of vaginal itching. States she has a hx of hypertension but has not been able to afford her meds for a while

## 2011-03-29 NOTE — Discharge Instructions (Signed)
Your urine test is negative for urinary tract infection. Your glucose however is elevated over 300. Please see your M.D. or M.D. at the health department for assistance in getting this back under control. Please use fluconazole tablet for assistance with the irritation.Hyperglycemia Hyperglycemia occurs when the glucose (sugar) in your blood is too high. Hyperglycemia can happen for many reasons, but it most often happens to people who do not know they have diabetes or are not managing their diabetes properly.  CAUSES  Whether you have diabetes or not, there are other causes of hyperglycemia. Hyperglycemia can occur when you have diabetes, but it can also occur in other situations that you might not be as aware of, such as: Diabetes  If you have diabetes and are having problems controlling your blood glucose, hyperglycemia could occur because of some of the following reasons:   Not following your meal plan.   Not taking your diabetes medications or not taking it properly.   Exercising less or doing less activity than you normally do.   Being sick.  Pre-diabetes  This cannot be ignored. Before people develop Type 2 diabetes, they almost always have "pre-diabetes." This is when your blood glucose levels are higher than normal, but not yet high enough to be diagnosed as diabetes. Research has shown that some long-term damage to the body, especially the heart and circulatory system, may already be occurring during pre-diabetes. If you take action to manage your blood glucose when you have pre-diabetes, you may delay or prevent Type 2 diabetes from developing.  Stress  If you have diabetes, you may be "diet" controlled or on oral medications or insulin to control your diabetes. However, you may find that your blood glucose is higher than usual in the hospital whether you have diabetes or not. This is often referred to as "stress hyperglycemia." Stress can elevate your blood glucose. This happens  because of hormones put out by the body during times of stress. If stress has been the cause of your high blood glucose, it can be followed regularly by your caregiver. That way he/she can make sure your hyperglycemia does not continue to get worse or progress to diabetes.  Steroids  Steroids are medications that act on the infection fighting system (immune system) to block inflammation or infection. One side effect can be a rise in blood glucose. Most people can produce enough extra insulin to allow for this rise, but for those who cannot, steroids make blood glucose levels go even higher. It is not unusual for steroid treatments to "uncover" diabetes that is developing. It is not always possible to determine if the hyperglycemia will go away after the steroids are stopped. A special blood test called an A1c is sometimes done to determine if your blood glucose was elevated before the steroids were started.  SYMPTOMS  Thirsty.   Frequent urination.   Dry mouth.   Blurred vision.   Tired or fatigue.   Weakness.   Sleepy.   Tingling in feet or leg.  DIAGNOSIS  Diagnosis is made by monitoring blood glucose in one or all of the following ways:  A1c test. This is a chemical found in your blood.   Fingerstick blood glucose monitoring.   Laboratory results.  TREATMENT  First, knowing the cause of the hyperglycemia is important before the hyperglycemia can be treated. Treatment may include, but is not be limited to:  Education.   Change or adjustment in medications.   Change or adjustment in meal plan.  Treatment for an illness, infection, etc.   More frequent blood glucose monitoring.   Change in exercise plan.   Decreasing or stopping steroids.   Lifestyle changes.  HOME CARE INSTRUCTIONS   Test your blood glucose as directed.   Exercise regularly. Your caregiver will give you instructions about exercise. Pre-diabetes or diabetes which comes on with stress is helped by  exercising.   Eat wholesome, balanced meals. Eat often and at regular, fixed times. Your caregiver or nutritionist will give you a meal plan to guide your sugar intake.   Being at an ideal weight is important. If needed, losing as little as 10 to 15 pounds may help improve blood glucose levels.  SEEK MEDICAL CARE IF:   You have questions about medicine, activity, or diet.   You continue to have symptoms (problems such as increased thirst, urination, or weight gain).  SEEK IMMEDIATE MEDICAL CARE IF:   You are vomiting or have diarrhea.   Your breath smells fruity.   You are breathing faster or slower.   You are very sleepy or incoherent.   You have numbness, tingling, or pain in your feet or hands.   You have chest pain.   Your symptoms get worse even though you have been following your caregiver's orders.   If you have any other questions or concerns.  Document Released: 07/03/2000 Document Revised: 12/27/2010 Document Reviewed: 08/29/2008 [facility] Patient Information 2012 [place], [place].

## 2011-04-06 NOTE — ED Provider Notes (Signed)
History     CSN: [id number]  Arrival date & time 03/29/11  1040   First MD Initiated Contact with Patient 03/29/11 1214      Chief Complaint  Patient presents with  . Vaginal Itching    (Consider location/radiation/quality/duration/timing/severity/associated sxs/prior treatment) HPI Comments: African American obese female with hx of type II diabetes. States she has been poorly compliant with her medications an her diet. She has vaginal irritation and itching, an at times irritation with urination.  Patient is a 51 y.o. female presenting with vaginal itching. The history is provided by the patient.  Vaginal Itching This is a new problem. The current episode started in the past 7 days. The problem occurs daily. The problem has been unchanged. Associated symptoms include headaches. Pertinent negatives include no chills, fever or nausea. The symptoms are aggravated by nothing. She has tried nothing for the symptoms. The treatment provided no relief.    Past Medical History  Diagnosis Date  . CHF (congestive heart failure)     minamal  . Cardiomyopathy   . Hypertension   . Diabetes mellitus   . Normal echocardiogram     LVEF 25-30% 03/27/2010  . COPD (chronic obstructive pulmonary disease)     Past Surgical History  Procedure Date  . Cardiac catheterization     Family History  Problem Relation Age of Onset  . Stroke Mother   . Lung cancer Father     History  Substance Use Topics  . Smoking status: Current Everyday Smoker -- 1.5 packs/day    Types: Cigarettes  . Smokeless tobacco: Not on file  . Alcohol Use: Yes    OB History    Grav Para Term Preterm Abortions TAB SAB Ect Mult Living                  Review of Systems  Constitutional: Negative for fever and chills.  Gastrointestinal: Negative for nausea.  Genitourinary: Positive for dysuria.       Vaginal itching.  Neurological: Positive for headaches.    Allergies  Latex  Home Medications   Current  Outpatient Rx  Name Route Sig Dispense Refill  . ACETAMINOPHEN 500 MG PO TABS Oral Take 1,000-1,500 mg by mouth daily as needed. Pain     . ASPIRIN 81 MG PO TABS Oral Take 81 mg by mouth daily.      Marland Kitchen CARVEDILOL 6.25 MG PO TABS Oral Take 1 tablet (6.25 mg total) by mouth 2 (two) times daily with a meal. 60 tablet 0  . CHLORTHALIDONE 25 MG PO TABS Oral Take 1 tablet (25 mg total) by mouth daily. 30 tablet 0  . DIPHENHYDRAMINE HCL 25 MG PO TABS Oral Take 25 mg by mouth every 6 (six) hours as needed. Allergies    . FLUCONAZOLE 150 MG PO TABS Oral Take 1 tablet (150 mg total) by mouth daily. 1 tablet 0  . LISINOPRIL 10 MG PO TABS Oral Take 1 tablet (10 mg total) by mouth daily. 30 tablet 0  . METFORMIN HCL 500 MG PO TABS Oral Take 1 tablet (500 mg total) by mouth 2 (two) times daily with a meal. 60 tablet 0  . METHOCARBAMOL 500 MG PO TABS  Take two tablets po TID prn muscle spasms 42 tablet 0  . NITE TIME COLD PO Oral Take 1 tablet by mouth at bedtime as needed. Cold Symptoms       BP 195/105  Pulse 75  Temp(Src) 98 F (36.7 C) (Oral)  Resp  20  Ht 4\' 11"  (1.499 m)  Wt 189 lb (85.73 kg)  BMI 38.17 kg/m2  SpO2 97%  LMP 08/02/2010  Physical Exam  Nursing note and vitals reviewed. Constitutional: She is oriented to person, place, and time. She appears well-developed and well-nourished.  Non-toxic appearance.  HENT:  Head: Normocephalic.  Right Ear: Tympanic membrane and external ear normal.  Left Ear: Tympanic membrane and external ear normal.  Eyes: EOM and lids are normal. Pupils are equal, round, and reactive to light.  Neck: Normal range of motion. Neck supple. Carotid bruit is not present.  Cardiovascular: Normal rate, regular rhythm, normal heart sounds, intact distal pulses and normal pulses.   Pulmonary/Chest: Breath sounds normal. No respiratory distress.  Abdominal: Soft. Bowel sounds are normal. There is no tenderness. There is no guarding.       No CVAT. No suprapubic  tenderness.  Musculoskeletal: Normal range of motion.  Lymphadenopathy:       Head (right side): No submandibular adenopathy present.       Head (left side): No submandibular adenopathy present.    She has no cervical adenopathy.  Neurological: She is alert and oriented to person, place, and time. She has normal strength. No cranial nerve deficit or sensory deficit.  Skin: Skin is warm and dry.  Psychiatric: She has a normal mood and affect. Her speech is normal.    ED Course  Procedures (including critical care time)  Labs Reviewed  URINALYSIS, ROUTINE W REFLEX MICROSCOPIC - Abnormal; Notable for the following:    Glucose, UA >1000 (*)    All other components within normal limits  GLUCOSE, CAPILLARY - Abnormal; Notable for the following:    Glucose-Capillary 314 (*)    All other components within normal limits  URINE MICROSCOPIC-ADD ON  LAB REPORT - SCANNED   No results found.   1. Vaginal irritation   2. Hyperglycemia       MDM  I have reviewed nursing notes, vital signs, and all appropriate lab and imaging results for this patient. UA negative for UTI. Pos for elevated glucose. CBG elevated at 314. Pt treated with diflucan. Urine sent for culture. Pt to get back on her diet and take meds as suggested. She will follow up on the elevated glucose next week.       Felicia [name], [place] 04/06/11 1750

## 2011-04-12 NOTE — ED Provider Notes (Signed)
Medical screening examination/treatment/procedure(s) were performed by non-physician practitioner and as supervising physician I was immediately available for consultation/collaboration.   Shelda Jakes, MD 04/12/11 1004

## 2011-05-31 ENCOUNTER — Encounter: Payer: Self-pay | Admitting: Adult Health

## 2011-05-31 ENCOUNTER — Ambulatory Visit (INDEPENDENT_AMBULATORY_CARE_PROVIDER_SITE_OTHER): Payer: Medicaid Other | Admitting: Adult Health

## 2011-05-31 VITALS — BP 150/95 | HR 82 | Resp 16 | Ht 59.0 in | Wt 168.0 lb

## 2011-05-31 DIAGNOSIS — I428 Other cardiomyopathies: Secondary | ICD-10-CM

## 2011-05-31 DIAGNOSIS — E109 Type 1 diabetes mellitus without complications: Secondary | ICD-10-CM

## 2011-05-31 DIAGNOSIS — I10 Essential (primary) hypertension: Secondary | ICD-10-CM | POA: Insufficient documentation

## 2011-05-31 MED ORDER — HYDROCHLOROTHIAZIDE 12.5 MG PO CAPS: 12.5000 mg | ORAL_CAPSULE | Freq: Every day | ORAL | Status: DC

## 2011-05-31 MED ORDER — POTASSIUM CHLORIDE ER 10 MEQ PO TBCR: 10.0000 meq | EXTENDED_RELEASE_TABLET | Freq: Every day | ORAL | Status: DC

## 2011-05-31 NOTE — Assessment & Plan Note (Addendum)
She will have a repeat echocardiogram for reassessment of LV fx with history of systolic dysfunction with no follow-up due to no-show. She is advised on low sodium diet. She has not be compliant with this.

## 2011-05-31 NOTE — Patient Instructions (Signed)
**Note De-Identified Felicia [name] Obfuscation** Your physician has requested that you have an echocardiogram. Echocardiography is a painless test that uses sound waves to create images of your heart. It provides your doctor with information about the size and shape of your heart and how well your heart's chambers and valves are working. This procedure takes approximately one hour. There are no restrictions for this procedure.  Your physician recommends that you return for lab work in: 2 weeks   Your physician has recommended you make the following change in your medication: start taking HCTZ 12.5 mg daily and Potassium 10 meq. daily  Your physician recommends that you schedule a follow-up appointment in: 1 month

## 2011-05-31 NOTE — Assessment & Plan Note (Signed)
She has been started on insulin per Dr. Janna Arch for better control. He is managing.

## 2011-05-31 NOTE — Progress Notes (Signed)
   HPI: Felicia [name] is a 51 y/o patient of Dr. Diona Browner who has been lost to follow up for greater than one year with known history of NICM last EF of 25%-30% per echo in 2012. She has not been on coreg for over 6 months. She has been seen by Dr. Janna Arch and is not placed on insulin due to uncontrolled diabetes with most recent HgA1C of 14.4. She was also recently placed on lisinopril 10 mg as well. She comes today for cardiac follow-up. She continues have dietary noncompliance with salty foods. She denies fluid retention or DOE, she has lost 20 lbs since being seen last.  She has had no hospitalizations, ER visits or new diagnosis since last being seen.   Allergies  Allergen Reactions  . Latex Itching    Current Outpatient Prescriptions  Medication Sig Dispense Refill  . acetaminophen (TYLENOL) 500 MG tablet Take 1,000-1,500 mg by mouth daily as needed. Pain       . aspirin 81 MG tablet Take 81 mg by mouth daily.        . diphenhydrAMINE (BENADRYL) 25 MG tablet Take 25 mg by mouth every 6 (six) hours as needed. Allergies      . insulin glargine (LANTUS) 100 UNIT/ML injection Inject 12 Units into the skin at bedtime.      Marland Kitchen lisinopril (PRINIVIL,ZESTRIL) 10 MG tablet Take 1 tablet (10 mg total) by mouth daily.  30 tablet  0  . metFORMIN (GLUCOPHAGE) 500 MG tablet Take 1 tablet (500 mg total) by mouth 2 (two) times daily with a meal.  60 tablet  0  . Pseudoeph-Doxylamine-DM-APAP (NITE TIME COLD PO) Take 1 tablet by mouth at bedtime as needed. Cold Symptoms         Past Medical History  Diagnosis Date  . CHF (congestive heart failure)     minamal  . Cardiomyopathy   . Hypertension   . Diabetes mellitus   . Normal echocardiogram     LVEF 25-30% 03/27/2010  . COPD (chronic obstructive pulmonary disease)     Past Surgical History  Procedure Date  . Cardiac catheterization     ZOX:WRUEAV of systems complete and found to be negative unless listed above PHYSICAL EXAM BP 150/95  Pulse 82   Resp 16  Ht 4\' 11"  (1.499 m)  Wt 168 lb (76.204 kg)  BMI 33.93 kg/m2  LMP 08/02/2010  EKG:  ASSESSMENT AND PLAN

## 2011-05-31 NOTE — Assessment & Plan Note (Signed)
Blood pressure is not well controlled. She states she ate a chicken biscuit and fries for lunch. I will add HCTZ to her medication regimen. Last creatinine is 0.69 on 05/14/2011.

## 2011-06-04 ENCOUNTER — Ambulatory Visit (HOSPITAL_COMMUNITY)
Admission: RE | Admit: 2011-06-04 | Discharge: 2011-06-04 | Disposition: A | Discharge status: Home / Self Care | Payer: Medicaid Other | Source: Ambulatory Visit | Attending: Adult Health | Admitting: Adult Health

## 2011-06-04 DIAGNOSIS — I369 Nonrheumatic tricuspid valve disorder, unspecified: Secondary | ICD-10-CM

## 2011-06-04 DIAGNOSIS — I428 Other cardiomyopathies: Secondary | ICD-10-CM | POA: Insufficient documentation

## 2011-06-04 DIAGNOSIS — I10 Essential (primary) hypertension: Secondary | ICD-10-CM | POA: Insufficient documentation

## 2011-06-04 DIAGNOSIS — E119 Type 2 diabetes mellitus without complications: Secondary | ICD-10-CM | POA: Insufficient documentation

## 2011-06-14 ENCOUNTER — Other Ambulatory Visit: Payer: Self-pay

## 2011-06-14 DIAGNOSIS — I428 Other cardiomyopathies: Secondary | ICD-10-CM

## 2011-06-14 DIAGNOSIS — I10 Essential (primary) hypertension: Secondary | ICD-10-CM

## 2011-06-20 LAB — BASIC METABOLIC PANEL
BUN: 11 mg/dL (ref 6–23)
Potassium: 4.6 mEq/L (ref 3.5–5.3)
Sodium: 135 mEq/L (ref 135–145)

## 2011-07-05 ENCOUNTER — Ambulatory Visit (INDEPENDENT_AMBULATORY_CARE_PROVIDER_SITE_OTHER): Payer: Medicaid Other | Admitting: Adult Health

## 2011-07-05 ENCOUNTER — Encounter: Payer: Self-pay | Admitting: Adult Health

## 2011-07-05 VITALS — BP 149/101 | HR 112 | Resp 16 | Ht 59.0 in | Wt 170.0 lb

## 2011-07-05 DIAGNOSIS — I428 Other cardiomyopathies: Secondary | ICD-10-CM

## 2011-07-05 DIAGNOSIS — I10 Essential (primary) hypertension: Secondary | ICD-10-CM

## 2011-07-05 MED ORDER — HYDROCHLOROTHIAZIDE 25 MG PO TABS: 25.0000 mg | ORAL_TABLET | Freq: Every day | ORAL | Status: DC

## 2011-07-05 NOTE — Progress Notes (Signed)
   HPI: Felicia [name] is a 51 y/o patient of Dr. Diona Browner we are following for difficult to control hypertension. She has a history of NICM, diabetes. On last visit, I added HCTZ 12.5 mg daily and repeated her echocardiogram for continued evaluation of Lv fx. She is here for discussion of results. She has eaten ham for breakfast and sometimes has sausage biscuits. She has not adhered to low sodium diet. She denies chest pain or DOE.  Allergies  Allergen Reactions  . Latex Itching    Current Outpatient Prescriptions  Medication Sig Dispense Refill  . acetaminophen (TYLENOL) 500 MG tablet Take 1,000-1,500 mg by mouth daily as needed. Pain       . aspirin 81 MG tablet Take 81 mg by mouth daily.        . diphenhydrAMINE (BENADRYL) 25 MG tablet Take 25 mg by mouth every 6 (six) hours as needed. Allergies      . insulin glargine (LANTUS) 100 UNIT/ML injection Inject into the skin as directed.       Marland Kitchen lisinopril (PRINIVIL,ZESTRIL) 10 MG tablet Take 1 tablet (10 mg total) by mouth daily.  30 tablet  0  . metFORMIN (GLUCOPHAGE) 500 MG tablet Take 1 tablet (500 mg total) by mouth 2 (two) times daily with a meal.  60 tablet  0  . potassium chloride (K-DUR) 10 MEQ tablet Take 1 tablet (10 mEq total) by mouth daily.  30 tablet  3  . Pseudoeph-Doxylamine-DM-APAP (NITE TIME COLD PO) Take 1 tablet by mouth at bedtime as needed. Cold Symptoms       . hydrochlorothiazide (HYDRODIURIL) 25 MG tablet Take 1 tablet (25 mg total) by mouth daily.  90 tablet  1    Past Medical History  Diagnosis Date  . CHF (congestive heart failure)     minamal  . Cardiomyopathy   . Hypertension   . Diabetes mellitus   . Normal echocardiogram     LVEF 25-30% 03/27/2010  . COPD (chronic obstructive pulmonary disease)     Past Surgical History  Procedure Date  . Cardiac catheterization     PHYSICAL EXAM BP 149/101  Pulse 112  Resp 16  Ht 4\' 11"  (1.499 m)  Wt 170  ROS: Review of systems complete and found to be  negative unless listed above General: Well developed, well nourished, in no acute distress Head: Eyes PERRLA, No xanthomas.   Normal cephalic and atraumatic \\Lungs : Clear bilaterally to auscultation and percussion. Heart: HRRR S1 S2,tachycardic  without MRG.  Pulses are 2+ & equal.            No carotid bruit. No JVD.  No abdominal bruits. No femoral bruits. Abdomen: Bowel sounds are positive, abdomen soft and non-tender without masses or                  Hernia's noted. Msk:  Back normal, normal gait. Normal strength and tone for age. Extremities: No clubbing, cyanosis or edema.  DP +1 Neuro: Alert and oriented X 3. Psych:  Good affect, responds appropriately     ASSESSMENT AND PLAN

## 2011-07-05 NOTE — Assessment & Plan Note (Signed)
Review of recent echocardiogram demonstrates improved EF of 50-55%/ Mild septal hypokinesis. Mildly calcified AoV stenosis. I have congratulated her on her heart function improvement. She is to continue to watch salt intake and exercise for wt loss.

## 2011-07-05 NOTE — Assessment & Plan Note (Signed)
Not well controlled for diabetic patient. Salt intake is a problem for her. I have increased her HCTZ to 25 mg daily and have repeated her BMET for kidney function. I have counseled her on salt restriction.

## 2011-07-05 NOTE — Patient Instructions (Addendum)
**Note De-Identified [place] Obfuscation** Your physician has recommended you make the following change in your medication: increase Hydrochlorothiazide to 25 mg daily   Your physician recommends that you return for lab work in: 1 week  Your physician recommends that you schedule a follow-up appointment in: 6 months

## 2011-07-22 ENCOUNTER — Other Ambulatory Visit: Payer: Self-pay | Admitting: Adult Health

## 2011-07-23 LAB — BASIC METABOLIC PANEL
CO2: 27 mEq/L (ref 19–32)
Chloride: 101 mEq/L (ref 96–112)
Glucose, Bld: 236 mg/dL — ABNORMAL HIGH (ref 70–99)
Potassium: 4.3 mEq/L (ref 3.5–5.3)
Sodium: 138 mEq/L (ref 135–145)

## 2011-12-18 ENCOUNTER — Emergency Department (HOSPITAL_COMMUNITY)
Admission: EM | Admit: 2011-12-18 | Discharge: 2011-12-18 | Disposition: A | Discharge status: Home / Self Care | Payer: Medicaid Other | Attending: Emergency Medicine | Admitting: Emergency Medicine

## 2011-12-18 ENCOUNTER — Encounter (HOSPITAL_COMMUNITY): Payer: Self-pay | Admitting: *Deleted

## 2011-12-18 ENCOUNTER — Emergency Department (HOSPITAL_COMMUNITY): Payer: Medicaid Other

## 2011-12-18 DIAGNOSIS — Z79899 Other long term (current) drug therapy: Secondary | ICD-10-CM | POA: Insufficient documentation

## 2011-12-18 DIAGNOSIS — Z7982 Long term (current) use of aspirin: Secondary | ICD-10-CM | POA: Insufficient documentation

## 2011-12-18 DIAGNOSIS — I428 Other cardiomyopathies: Secondary | ICD-10-CM | POA: Insufficient documentation

## 2011-12-18 DIAGNOSIS — M792 Neuralgia and neuritis, unspecified: Secondary | ICD-10-CM

## 2011-12-18 DIAGNOSIS — IMO0002 Reserved for concepts with insufficient information to code with codable children: Secondary | ICD-10-CM | POA: Insufficient documentation

## 2011-12-18 DIAGNOSIS — I10 Essential (primary) hypertension: Secondary | ICD-10-CM | POA: Insufficient documentation

## 2011-12-18 DIAGNOSIS — Z794 Long term (current) use of insulin: Secondary | ICD-10-CM | POA: Insufficient documentation

## 2011-12-18 DIAGNOSIS — J4489 Other specified chronic obstructive pulmonary disease: Secondary | ICD-10-CM | POA: Insufficient documentation

## 2011-12-18 DIAGNOSIS — I509 Heart failure, unspecified: Secondary | ICD-10-CM | POA: Insufficient documentation

## 2011-12-18 DIAGNOSIS — E119 Type 2 diabetes mellitus without complications: Secondary | ICD-10-CM | POA: Insufficient documentation

## 2011-12-18 DIAGNOSIS — J449 Chronic obstructive pulmonary disease, unspecified: Secondary | ICD-10-CM | POA: Insufficient documentation

## 2011-12-18 DIAGNOSIS — F17200 Nicotine dependence, unspecified, uncomplicated: Secondary | ICD-10-CM | POA: Insufficient documentation

## 2011-12-18 LAB — GLUCOSE, CAPILLARY: Glucose-Capillary: 187 mg/dL — ABNORMAL HIGH (ref 70–99)

## 2011-12-18 MED ORDER — NAPROXEN 500 MG PO TABS: 500.0000 mg | ORAL_TABLET | Freq: Two times a day (BID) | ORAL | Status: DC

## 2011-12-18 MED ORDER — OXYCODONE-ACETAMINOPHEN 5-325 MG PO TABS
1.0000 | ORAL_TABLET | Freq: Once | ORAL | Status: AC
  Administered 2011-12-18: 1 via ORAL
  Filled 2011-12-18: qty 1

## 2011-12-18 MED ORDER — OXYCODONE-ACETAMINOPHEN 5-325 MG PO TABS: 1.0000 | ORAL_TABLET | Freq: Four times a day (QID) | ORAL | Status: AC | PRN

## 2011-12-18 NOTE — ED Notes (Signed)
Rt arm pain/numbness, for 1 week.  Dizzy at times.  nausea

## 2011-12-18 NOTE — ED Provider Notes (Signed)
History   This chart was scribed for Benny Lennert, MD by Gerlean Ren, ED Scribe. This patient was seen in room [facility]19/[facility]19 and the patient's care was started at 1:39 PM    CSN: [id number]  Arrival date & time 12/18/11  1327   First MD Initiated Contact with Patient 12/18/11 1336      Chief Complaint  Patient presents with  . Arm Pain     The history is provided by the patient. No language interpreter was used.   Felicia [name] is a 51 y.o. female who presents to the Emergency Department complaining of right hand numbness and tingling with mild associated pain first noticed one week ago but notably worse this morning.  No reported trauma, injuries, or falls.  Pt has no further complaints.  Pt has h/o CHF, HTN, DM, and COPD.  Pt is a current everyday smoker but denies alcohol use.   Past Medical History  Diagnosis Date  . CHF (congestive heart failure)     minamal  . Cardiomyopathy   . Hypertension   . Diabetes mellitus   . Normal echocardiogram     LVEF 25-30% 03/27/2010  . COPD (chronic obstructive pulmonary disease)     Past Surgical History  Procedure Date  . Cardiac catheterization   . Tubal ligation     Family History  Problem Relation Age of Onset  . Stroke Mother   . Lung cancer Father     History  Substance Use Topics  . Smoking status: Current Every Day Smoker -- 1.5 packs/day    Types: Cigarettes  . Smokeless tobacco: Not on file  . Alcohol Use: No    No OB history provided.  Review of Systems  Constitutional: Negative for fatigue.  HENT: Negative for congestion, sinus pressure and ear discharge.   Eyes: Negative for discharge.  Respiratory: Negative for cough.   Cardiovascular: Negative for chest pain.  Gastrointestinal: Negative for abdominal pain and diarrhea.  Genitourinary: Negative for frequency and hematuria.  Musculoskeletal: Negative for back pain.  Skin: Negative for rash.  Neurological: Positive for numbness. Negative for  seizures and headaches.  Hematological: Negative.   Psychiatric/Behavioral: Negative for hallucinations.    Allergies  Latex  Home Medications   Current Outpatient Rx  Name  Route  Sig  Dispense  Refill  . ACETAMINOPHEN 500 MG PO TABS   Oral   Take 1,000-1,500 mg by mouth daily as needed. Pain          . ASPIRIN 81 MG PO TABS   Oral   Take 81 mg by mouth daily.           Marland Kitchen DIPHENHYDRAMINE HCL 25 MG PO TABS   Oral   Take 25 mg by mouth every 6 (six) hours as needed. Allergies         . HYDROCHLOROTHIAZIDE 25 MG PO TABS   Oral   Take 1 tablet (25 mg total) by mouth daily.   90 tablet   1     Dose increase   . INSULIN GLARGINE 100 UNIT/ML [place] SOLN   Subcutaneous   Inject into the skin as directed.          Marland Kitchen LISINOPRIL 10 MG PO TABS   Oral   Take 1 tablet (10 mg total) by mouth daily.   30 tablet   0   . METFORMIN HCL 500 MG PO TABS   Oral   Take 1 tablet (500 mg total) by mouth 2 (  two) times daily with a meal.   60 tablet   0   . POTASSIUM CHLORIDE ER 10 MEQ PO TBCR   Oral   Take 1 tablet (10 mEq total) by mouth daily.   30 tablet   3   . NITE TIME COLD PO   Oral   Take 1 tablet by mouth at bedtime as needed. Cold Symptoms            BP 146/93  Pulse 120  Temp 98.5 F (36.9 C) (Oral)  Resp 20  Ht 4\' 11"  (1.499 m)  Wt 181 lb 9 oz (82.356 kg)  BMI 36.67 kg/m2  SpO2 96%  LMP 08/02/2010  Physical Exam  Nursing note and vitals reviewed. Constitutional: She is oriented to person, place, and time. She appears well-developed.  HENT:  Head: Normocephalic and atraumatic.  Eyes: Conjunctivae normal and EOM are normal. No scleral icterus.  Neck: Neck supple. No thyromegaly present.  Cardiovascular: Normal rate and regular rhythm.  Exam reveals no gallop and no friction rub.   No murmur heard. Pulmonary/Chest: No stridor. She has no wheezes. She has no rales. She exhibits no tenderness.  Abdominal: She exhibits no distension. There is no  tenderness. There is no rebound.  Musculoskeletal: Normal range of motion. She exhibits no edema.  Lymphadenopathy:    She has no cervical adenopathy.  Neurological: She is oriented to person, place, and time. Coordination normal.       Mild numbness to palmar side of right hand.  Skin: No rash noted. No erythema.  Psychiatric: She has a normal mood and affect. Her behavior is normal.    ED Course  Procedures (including critical care time) DIAGNOSTIC STUDIES: Oxygen Saturation is 96% on room air, adequate by my interpretation.    COORDINATION OF CARE: 1:42 PM- Patient informed of clinical course, understands medical decision-making process, and agrees with plan.  Ordered PO percocet and cervical spine XR.  Labs Reviewed - No data to display Results for orders placed during the hospital encounter of 12/18/11  GLUCOSE, CAPILLARY      Component Value Range   Glucose-Capillary 187 (*) 70 - 99 mg/dL    Dg Cervical Spine Complete  12/18/2011  *RADIOLOGY REPORT*  Clinical Data: Right arm numbness and tingling for 1 week.  CERVICAL SPINE - COMPLETE 4+ VIEW  Comparison: None.  Findings: Cervical spinal alignment is anatomic.  The cervicothoracic junction appears within normal limits. Prevertebral soft tissues are normal.  There is no cervical spine fracture or dislocation.  The foramina are patent.  Tip of the odontoid is poorly visualized due to bony overlap of the femur.  No displaced odontoid fracture.  No bony foraminal stenosis.  For further assessment, consider MRI in this patient with potential right upper extremity radicular symptoms. Cervical intervertebral disc spaces and facet joints appear within normal limits.  IMPRESSION: No acute osseous abnormality. No bony foraminal stenosis in this patient with right upper extremity radiculopathy.   Original Report Authenticated By: Andreas Newport, M.D.      No diagnosis found.    MDM   The chart was scribed for me under my direct  supervision.  I personally performed the history, physical, and medical decision making and all procedures in the evaluation of this patient.Benny Lennert, MD 12/18/11 1435

## 2012-01-21 ENCOUNTER — Ambulatory Visit (INDEPENDENT_AMBULATORY_CARE_PROVIDER_SITE_OTHER): Payer: Medicaid Other | Admitting: Adult Health

## 2012-01-21 ENCOUNTER — Encounter: Payer: Self-pay | Admitting: Adult Health

## 2012-01-21 VITALS — BP 128/87 | HR 94 | Ht 59.0 in | Wt 175.0 lb

## 2012-01-21 DIAGNOSIS — I10 Essential (primary) hypertension: Secondary | ICD-10-CM

## 2012-01-21 DIAGNOSIS — E109 Type 1 diabetes mellitus without complications: Secondary | ICD-10-CM

## 2012-01-21 DIAGNOSIS — I428 Other cardiomyopathies: Secondary | ICD-10-CM

## 2012-01-21 MED ORDER — HYDROCHLOROTHIAZIDE 25 MG PO TABS: 25.0000 mg | ORAL_TABLET | Freq: Every day | ORAL | Status: AC

## 2012-01-21 MED ORDER — LISINOPRIL 10 MG PO TABS: 10.0000 mg | ORAL_TABLET | Freq: Every day | ORAL | Status: DC

## 2012-01-21 NOTE — Progress Notes (Deleted)
Name: Felicia [name]    DOB: 03/11/1960  Age: 51 y.o.  MR#: [id number]       PCP:  Isabella Stalling, MD      Insurance: @PAYORNAME @   CC:   No chief complaint on file.   VS BP 132/96  Pulse 117  Ht 4\' 11"  (1.499 m)  Wt 175 lb (79.379 kg)  BMI 35.35 kg/m2  SpO2 97%  LMP 08/02/2010  Weights Current Weight  01/21/12 175 lb (79.379 kg)  12/18/11 181 lb 9 oz (82.356 kg)  07/05/11 170 lb (77.111 kg)    Blood Pressure  BP Readings from Last 3 Encounters:  01/21/12 132/96  12/18/11 146/93  07/05/11 149/101     Admit date:  (Not on file) Last encounter with RMR:  Visit date not found   Allergy Allergies  Allergen Reactions  . Latex Itching    Current Outpatient Prescriptions  Medication Sig Dispense Refill  . acetaminophen (TYLENOL) 500 MG tablet Take 1,000-1,500 mg by mouth daily as needed. Pain      . aspirin 81 MG tablet Take 81 mg by mouth daily.        . diphenhydrAMINE (BENADRYL) 25 MG tablet Take 25 mg by mouth every 6 (six) hours as needed. Allergies      . hydrochlorothiazide (HYDRODIURIL) 25 MG tablet Take 1 tablet (25 mg total) by mouth daily.  90 tablet  1  . ibuprofen (ADVIL,MOTRIN) 200 MG tablet Take 200 mg by mouth every 8 (eight) hours as needed. pain      . insulin glargine (LANTUS) 100 UNIT/ML injection Inject 24 Units into the skin at bedtime.       Marland Kitchen lisinopril (PRINIVIL,ZESTRIL) 10 MG tablet Take 1 tablet (10 mg total) by mouth daily.  30 tablet  0  . metFORMIN (GLUCOPHAGE) 500 MG tablet Take 1 tablet (500 mg total) by mouth 2 (two) times daily with a meal.  60 tablet  0  . naproxen (NAPROSYN) 500 MG tablet Take 500 mg by mouth 2 (two) times daily as needed.      . potassium chloride (K-DUR) 10 MEQ tablet Take 1 tablet (10 mEq total) by mouth daily.  30 tablet  3  . Pseudoeph-Doxylamine-DM-APAP (NITE TIME COLD PO) Take 1 tablet by mouth at bedtime as needed. Cold Symptoms        Discontinued Meds:    Medications Discontinued During This Encounter    Medication Reason  . naproxen (NAPROSYN) 500 MG tablet     Patient Active Problem List  Diagnosis  . Nonischemic cardiomyopathy  . Diabetes mellitus type I  . Hypertension    LABS Admission on 12/18/2011, Discharged on 12/18/2011  Component Date Value  . Glucose-Capillary 12/18/2011 187*     Results for this Opt Visit:     Results for orders placed during the hospital encounter of 12/18/11  GLUCOSE, CAPILLARY      Component Value Range   Glucose-Capillary 187 (*) 70 - 99 mg/dL    EKG Orders placed during the hospital encounter of 12/18/11  . ED EKG  . ED EKG  . EKG 12-LEAD  . EKG 12-LEAD  . ED EKG  . ED EKG  . EKG     Prior Assessment and Plan Problem List as of 01/21/2012          Nonischemic cardiomyopathy   Last Assessment & Plan Note   07/05/2011 Office Visit Signed 07/05/2011 12:36 PM by Jodelle Gross, NP    Review of recent  echocardiogram demonstrates improved EF of 50-55%/ Mild septal hypokinesis. Mildly calcified AoV stenosis. I have congratulated her on her heart function improvement. She is to continue to watch salt intake and exercise for wt loss.    Diabetes mellitus type I   Last Assessment & Plan Note   05/31/2011 Office Visit Signed 05/31/2011  4:15 PM by Jodelle Gross, NP    She has been started on insulin per Dr. Janna Arch for better control. He is managing.    Hypertension   Last Assessment & Plan Note   07/05/2011 Office Visit Signed 07/05/2011 12:38 PM by Jodelle Gross, NP    Not well controlled for diabetic patient. Salt intake is a problem for her. I have increased her HCTZ to 25 mg daily and have repeated her BMET for kidney function. I have counseled her on salt restriction.        Imaging: No results found.   FRS Calculation: Score not calculated. Missing: Total Cholesterol

## 2012-01-21 NOTE — Assessment & Plan Note (Signed)
Well controlled at present. I have rechecked it in the exam room with BP 138/70. No changes in medications.

## 2012-01-21 NOTE — Progress Notes (Signed)
HPI: Mrs. Bourget is a pleasant 51 y/o obese patient of Dr Diona Browner we are following for difficult to control hypertension, with history of NICM with normal EF in  June of 2013. She is being followed by Dr. Delbert Harness for diabetes and obesity. She walked from her home to our office today, approximately 1 mile. She has lost 6 lbs since being seen last. She is without cardiac complaint,but is having issues with her toe nails becoming thick, difficult to cut, and sometimes ingrowing on the great toes. She is medically compliant.  Allergies  Allergen Reactions  . Latex Itching    Current Outpatient Prescriptions  Medication Sig Dispense Refill  . acetaminophen (TYLENOL) 500 MG tablet Take 1,000-1,500 mg by mouth daily as needed. Pain      . aspirin 81 MG tablet Take 81 mg by mouth daily.        . diphenhydrAMINE (BENADRYL) 25 MG tablet Take 25 mg by mouth every 6 (six) hours as needed. Allergies      . hydrochlorothiazide (HYDRODIURIL) 25 MG tablet Take 1 tablet (25 mg total) by mouth daily.  90 tablet  1  . ibuprofen (ADVIL,MOTRIN) 200 MG tablet Take 200 mg by mouth every 8 (eight) hours as needed. pain      . insulin glargine (LANTUS) 100 UNIT/ML injection Inject 24 Units into the skin at bedtime.       Marland Kitchen lisinopril (PRINIVIL,ZESTRIL) 10 MG tablet Take 1 tablet (10 mg total) by mouth daily.  30 tablet  0  . metFORMIN (GLUCOPHAGE) 500 MG tablet Take 1 tablet (500 mg total) by mouth 2 (two) times daily with a meal.  60 tablet  0  . naproxen (NAPROSYN) 500 MG tablet Take 500 mg by mouth 2 (two) times daily as needed.      . potassium chloride (K-DUR) 10 MEQ tablet Take 1 tablet (10 mEq total) by mouth daily.  30 tablet  3  . Pseudoeph-Doxylamine-DM-APAP (NITE TIME COLD PO) Take 1 tablet by mouth at bedtime as needed. Cold Symptoms        Past Medical History  Diagnosis Date  . CHF (congestive heart failure)     minamal  . Cardiomyopathy   . Hypertension   . Diabetes mellitus   . Normal  echocardiogram     LVEF 25-30% 03/27/2010  . COPD (chronic obstructive pulmonary disease)     Past Surgical History  Procedure Date  . Cardiac catheterization   . Tubal ligation     HQI:ONGEXB of systems complete and found to be negative unless listed above  PHYSICAL EXAM BP 132/96  Pulse 117  Ht 4\' 11"  (1.499 m)  Wt 175 lb (79.379 kg)  BMI 35.35 kg/m2  SpO2 97%  LMP 08/02/2010  General: Well developed, well nourished, obese, in no acute distress Head: Eyes PERRLA, No xanthomas.   Normal cephalic and atramatic  Lungs: Clear bilaterally to auscultation and percussion. Heart: HRRR S1 S2, without MRG.  Pulses are 2+ & equal.            No carotid bruit. No JVD.  No abdominal bruits. No femoral bruits. Abdomen: Bowel sounds are positive, abdomen soft and non-tender without masses or                  Hernia's noted. Msk:  Back normal, normal gait. Normal strength and tone for age. Extremities: No clubbing, cyanosis or edema.  DP +1 Neuro: Alert and oriented X 3. Psych:  Good affect, responds  appropriately    ASSESSMENT AND PLAN

## 2012-01-21 NOTE — Assessment & Plan Note (Signed)
She is doing very well and is without cardiac complaint. She is committed to losing wt with a goal of 140 lbs. She is walking almost every where she goes, and avoiding salt and carbohydrates. I have provided refills on lisinopril and HCTZ. Will see her in 6 months unless she becomes symptomatic.Labs to be completed on Thursday with Dr. Delbert Harness.

## 2012-01-21 NOTE — Patient Instructions (Addendum)
Your physician recommends that you schedule a follow-up appointment in: 6 MONTHS WITH [facility]  PLEASE ADVISE DR DON DIEGO TO FAX LABS TO OUR OFFICE ONCE DRAWN FOR THIS UPCOMING Thursday, OUR FAX NUMBER IS [phone]

## 2012-01-21 NOTE — Assessment & Plan Note (Signed)
I have advised her to discuss thickened toe nails and difficultly cutting them with Dr. Janna Arch. He may wish to send her to podiatrist of his choosing to avoid infection or ingrown nail. Will defer to PCP.

## 2012-02-27 ENCOUNTER — Encounter: Payer: Self-pay | Admitting: Adult Health

## 2012-04-02 IMAGING — CR DG LUMBAR SPINE COMPLETE 4+V
5 series · 5 of 5 positions shown · non-contrast
Comparison: None

CLINICAL DATA: Fell down two steps, low back pain radiating down
right leg

LUMBAR SPINE - COMPLETE 4+ VIEW

[view not recorded (1 of 5)]
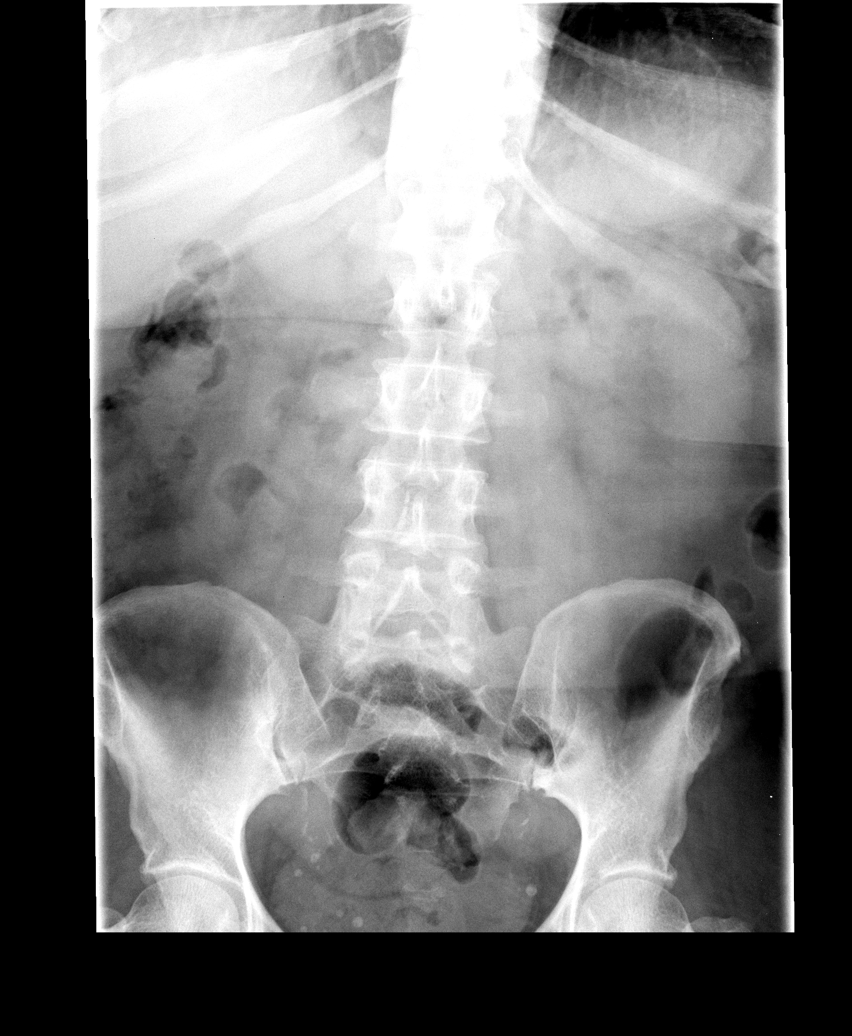

[view not recorded (2 of 5)]
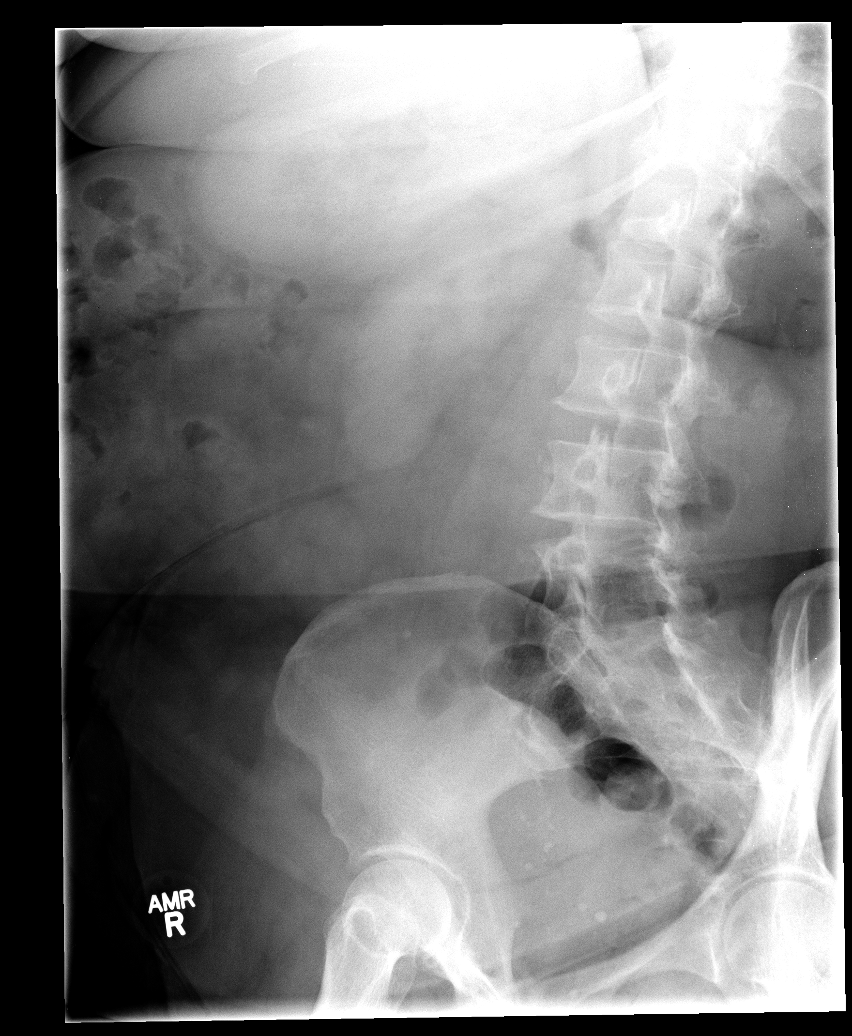

[view not recorded (3 of 5)]
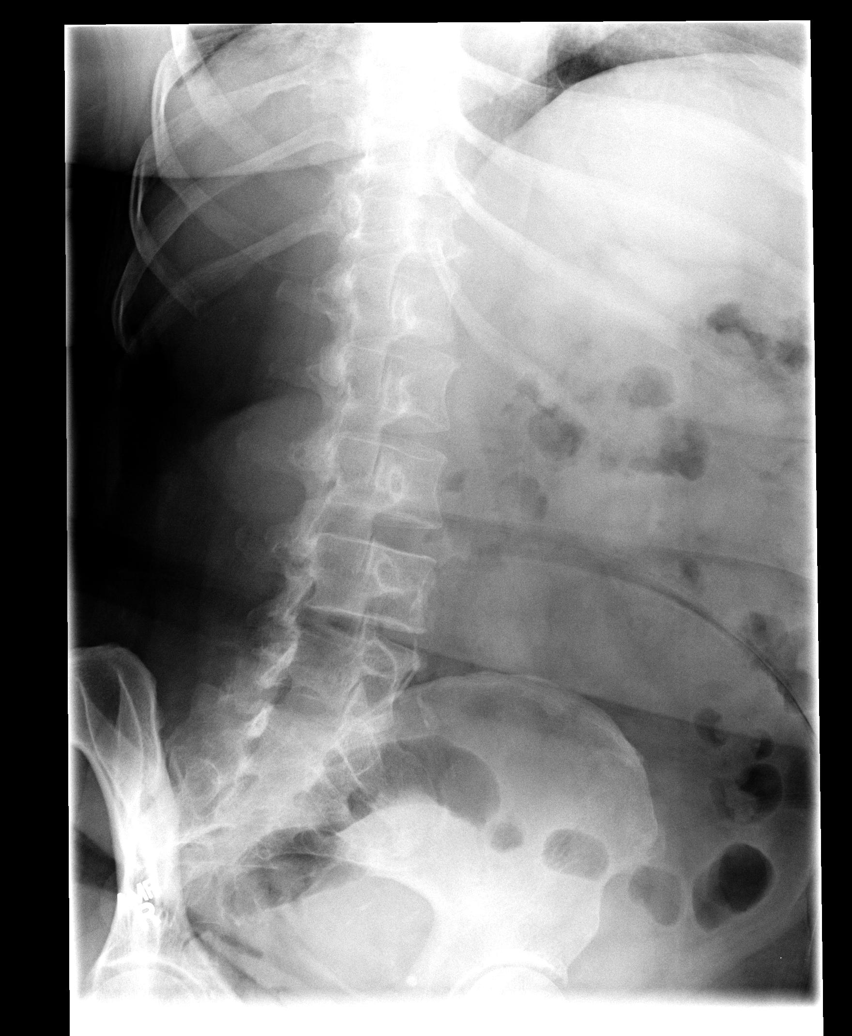

[view not recorded (4 of 5)]
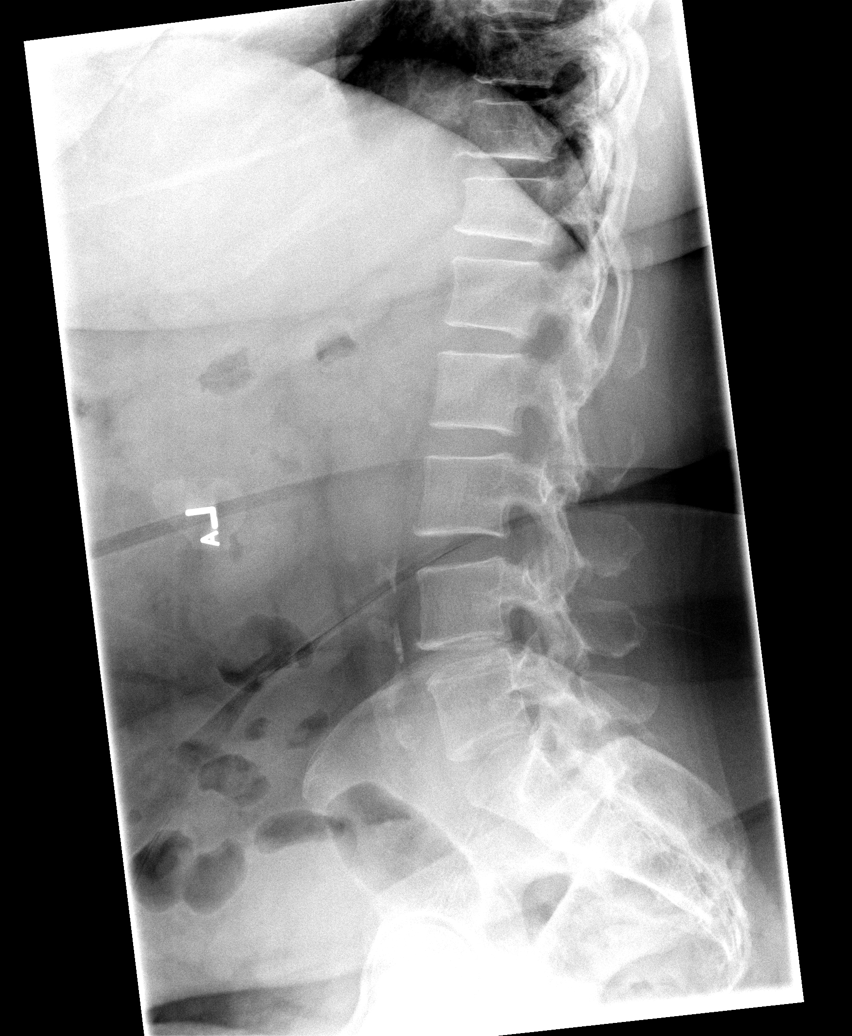

[view not recorded (5 of 5)]
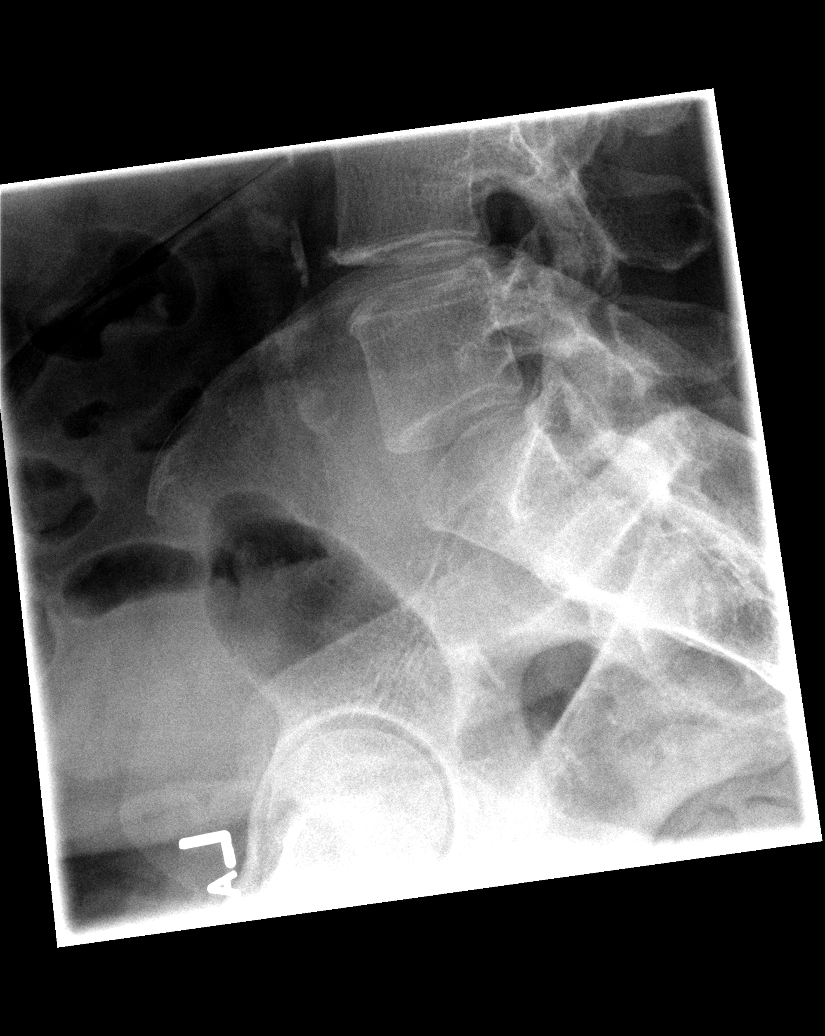

[5 of 5 positions shown; findings below may reference images not displayed]

FINDINGS: Osseous demineralization.
Five non-rib bearing lumbar vertebrae.
Minimal disc space narrowing at L4-L5.
Vertebral body and remaining disc space heights maintained.
Early degenerative disc disease changes T12-L1.
No acute fracture, subluxation or bone destruction.
No spondylolysis.
SI joints symmetric.
Scattered atherosclerotic calcifications and pelvic phleboliths.
IMPRESSION: No acute bony abnormalities.

## 2012-06-27 ENCOUNTER — Encounter (HOSPITAL_COMMUNITY): Payer: Self-pay | Admitting: *Deleted

## 2012-06-27 ENCOUNTER — Emergency Department (HOSPITAL_COMMUNITY)
Admission: EM | Admit: 2012-06-27 | Discharge: 2012-06-27 | Disposition: A | Discharge status: Home / Self Care | Payer: Self-pay | Attending: Emergency Medicine | Admitting: Emergency Medicine

## 2012-06-27 DIAGNOSIS — N949 Unspecified condition associated with female genital organs and menstrual cycle: Secondary | ICD-10-CM | POA: Insufficient documentation

## 2012-06-27 DIAGNOSIS — E119 Type 2 diabetes mellitus without complications: Secondary | ICD-10-CM | POA: Insufficient documentation

## 2012-06-27 DIAGNOSIS — J449 Chronic obstructive pulmonary disease, unspecified: Secondary | ICD-10-CM | POA: Insufficient documentation

## 2012-06-27 DIAGNOSIS — Z7982 Long term (current) use of aspirin: Secondary | ICD-10-CM | POA: Insufficient documentation

## 2012-06-27 DIAGNOSIS — R102 Pelvic and perineal pain: Secondary | ICD-10-CM

## 2012-06-27 DIAGNOSIS — J4489 Other specified chronic obstructive pulmonary disease: Secondary | ICD-10-CM | POA: Insufficient documentation

## 2012-06-27 DIAGNOSIS — I10 Essential (primary) hypertension: Secondary | ICD-10-CM | POA: Insufficient documentation

## 2012-06-27 DIAGNOSIS — Z91040 Latex allergy status: Secondary | ICD-10-CM | POA: Insufficient documentation

## 2012-06-27 DIAGNOSIS — I509 Heart failure, unspecified: Secondary | ICD-10-CM | POA: Insufficient documentation

## 2012-06-27 DIAGNOSIS — Z79899 Other long term (current) drug therapy: Secondary | ICD-10-CM | POA: Insufficient documentation

## 2012-06-27 DIAGNOSIS — F17200 Nicotine dependence, unspecified, uncomplicated: Secondary | ICD-10-CM | POA: Insufficient documentation

## 2012-06-27 DIAGNOSIS — Z794 Long term (current) use of insulin: Secondary | ICD-10-CM | POA: Insufficient documentation

## 2012-06-27 DIAGNOSIS — Z8679 Personal history of other diseases of the circulatory system: Secondary | ICD-10-CM | POA: Insufficient documentation

## 2012-06-27 LAB — BASIC METABOLIC PANEL
CO2: 30 mEq/L (ref 19–32)
Calcium: 9.9 mg/dL (ref 8.4–10.5)
Chloride: 100 mEq/L (ref 96–112)
Creatinine, Ser: 0.74 mg/dL (ref 0.50–1.10)
Glucose, Bld: 203 mg/dL — ABNORMAL HIGH (ref 70–99)

## 2012-06-27 LAB — URINE MICROSCOPIC-ADD ON

## 2012-06-27 LAB — URINALYSIS, ROUTINE W REFLEX MICROSCOPIC
Bilirubin Urine: NEGATIVE
Glucose, UA: 100 mg/dL — AB
Ketones, ur: NEGATIVE mg/dL
Leukocytes, UA: NEGATIVE
Protein, ur: NEGATIVE mg/dL

## 2012-06-27 LAB — CBC WITH DIFFERENTIAL/PLATELET
Eosinophils Relative: 1 % (ref 0–5)
HCT: 42.1 % (ref 36.0–46.0)
Hemoglobin: 14 g/dL (ref 12.0–15.0)
Lymphocytes Relative: 45 % (ref 12–46)
Lymphs Abs: 4.7 10*3/uL — ABNORMAL HIGH (ref 0.7–4.0)
MCV: 91.7 fL (ref 78.0–100.0)
Monocytes Absolute: 0.5 10*3/uL (ref 0.1–1.0)
Monocytes Relative: 5 % (ref 3–12)
Neutro Abs: 5.2 10*3/uL (ref 1.7–7.7)
RBC: 4.59 MIL/uL (ref 3.87–5.11)
WBC: 10.5 10*3/uL (ref 4.0–10.5)

## 2012-06-27 MED ORDER — HYDROCODONE-ACETAMINOPHEN 5-325 MG PO TABS: 1.0000 | ORAL_TABLET | Freq: Four times a day (QID) | ORAL | Status: DC | PRN

## 2012-06-27 NOTE — ED Notes (Signed)
Pt alert & oriented x4, stable gait. Patient given discharge instructions, paperwork & prescription(s). Patient  instructed to stop at the registration desk to finish any additional paperwork. Patient verbalized understanding. Pt left department w/ no further questions. 

## 2012-06-27 NOTE — ED Notes (Addendum)
Pt c/o pain to her left lower pelvic area "like where my ovaries are." x 2 hrs Denies any other symptoms.

## 2012-06-27 NOTE — ED Notes (Signed)
Pt reports left side pelvic pain that started about 1400. Pt denies taking any medication PTA.

## 2012-06-27 NOTE — ED Provider Notes (Signed)
History    This chart was scribed for Benny Lennert, MD by Leone Payor, ED Scribe. This patient was seen in room [facility]18[facility] and the patient's care was started 9:11 PM.   CSN: [id number]  Arrival date & time 06/27/12  1908   First MD Initiated Contact with Patient 06/27/12 2107      Chief Complaint  Patient presents with  . Pelvic Pain     Patient is a 52 y.o. female presenting with pelvic pain. The history is provided by the patient. No language interpreter was used.  Pelvic Pain This is a new problem. The current episode started 6 to 12 hours ago. The problem occurs constantly. The problem has not changed since onset.Pertinent negatives include no chest pain, no abdominal pain and no headaches. She has tried nothing for the symptoms.    HPI Comments: Felicia [name] is a 52 y.o. female who presents to the Emergency Department complaining of new, constant, unchanged, L sided pelvic pain that started about 7 hours ago today. She reports no longer having menstrual periods. She has not taken any OTC medication for pain. She denies any other symptoms. Pt has h/o HTN, DM.     Past Medical History  Diagnosis Date  . CHF (congestive heart failure)     minamal  . Cardiomyopathy   . Hypertension   . Diabetes mellitus   . Normal echocardiogram     LVEF 25-30% 03/27/2010  . COPD (chronic obstructive pulmonary disease)     Past Surgical History  Procedure Laterality Date  . Cardiac catheterization    . Tubal ligation      Family History  Problem Relation Age of Onset  . Stroke Mother   . Lung cancer Father     History  Substance Use Topics  . Smoking status: Current Every Day Smoker -- 1.50 packs/day    Types: Cigarettes  . Smokeless tobacco: Not on file  . Alcohol Use: No    OB History   Grav Para Term Preterm Abortions TAB SAB Ect Mult Living                  Review of Systems  Constitutional: Negative for appetite change and fatigue.  HENT: Negative for  congestion, sinus pressure and ear discharge.   Eyes: Negative for discharge.  Respiratory: Negative for cough.   Cardiovascular: Negative for chest pain.  Gastrointestinal: Negative for abdominal pain and diarrhea.  Genitourinary: Positive for pelvic pain. Negative for frequency and hematuria.  Musculoskeletal: Negative for back pain.  Skin: Negative for rash.  Neurological: Negative for seizures and headaches.  Psychiatric/Behavioral: Negative for hallucinations.    Allergies  Latex  Home Medications   Current Outpatient Rx  Name  Route  Sig  Dispense  Refill  . aspirin EC 81 MG tablet   Oral   Take 81 mg by mouth daily.         . insulin glargine (LANTUS) 100 UNIT/ML injection   Subcutaneous   Inject 24 Units into the skin at bedtime.          . metFORMIN (GLUCOPHAGE) 500 MG tablet   Oral   Take 1 tablet (500 mg total) by mouth 2 (two) times daily with a meal.   60 tablet   0   . potassium chloride (K-DUR) 10 MEQ tablet   Oral   Take 10 mEq by mouth daily.         Marland Kitchen acetaminophen (TYLENOL) 500 MG tablet  Oral   Take 1,000-1,500 mg by mouth daily as needed. Pain         . diphenhydrAMINE (BENADRYL) 25 MG tablet   Oral   Take 25 mg by mouth every 6 (six) hours as needed. Allergies         . hydrochlorothiazide (HYDRODIURIL) 25 MG tablet   Oral   Take 1 tablet (25 mg total) by mouth daily.   90 tablet   1     Dose increase   . ibuprofen (ADVIL,MOTRIN) 200 MG tablet   Oral   Take 200 mg by mouth every 8 (eight) hours as needed. pain         . lisinopril (PRINIVIL,ZESTRIL) 10 MG tablet   Oral   Take 1 tablet (10 mg total) by mouth daily.   90 tablet   1   . Pseudoeph-Doxylamine-DM-APAP (NITE TIME COLD PO)   Oral   Take 1 tablet by mouth at bedtime as needed. Cold Symptoms           BP 138/93  Pulse 102  Temp(Src) 97.8 F (36.6 C) (Oral)  Resp 20  Ht 4\' 11"  (1.499 m)  Wt 176 lb (79.833 kg)  BMI 35.53 kg/m2  SpO2 98%  LMP  08/02/2010  Physical Exam  Nursing note and vitals reviewed. Constitutional: She is oriented to person, place, and time. She appears well-developed.  HENT:  Head: Normocephalic.  Eyes: Conjunctivae and EOM are normal. No scleral icterus.  Neck: Neck supple. No thyromegaly present.  Cardiovascular: Normal rate, regular rhythm and normal heart sounds.  Exam reveals no gallop and no friction rub.   No murmur heard. Pulmonary/Chest: Breath sounds normal. No stridor. She has no wheezes. She has no rales. She exhibits no tenderness.  Abdominal: She exhibits no distension. There is no tenderness. There is no rebound.  Mild LLQ tenderness.    Genitourinary:  Tender left adenexal  Musculoskeletal: Normal range of motion. She exhibits no edema.  Lymphadenopathy:    She has no cervical adenopathy.  Neurological: She is oriented to person, place, and time. Coordination normal.  Skin: No rash noted. No erythema.  Psychiatric: She has a normal mood and affect. Her behavior is normal.    ED Course  Procedures (including critical care time)  DIAGNOSTIC STUDIES: Oxygen Saturation is 98% on room air, normal by my interpretation.    COORDINATION OF CARE: 9:12 PM Discussed treatment plan with pt at bedside and pt agreed to plan.   Labs Reviewed  URINALYSIS, ROUTINE W REFLEX MICROSCOPIC - Abnormal; Notable for the following:    Specific Gravity, Urine >1.030 (*)    Glucose, UA 100 (*)    Hgb urine dipstick TRACE (*)    All other components within normal limits  URINE MICROSCOPIC-ADD ON - Abnormal; Notable for the following:    Squamous Epithelial / LPF FEW (*)    All other components within normal limits  CBC WITH DIFFERENTIAL - Abnormal; Notable for the following:    Lymphs Abs 4.7 (*)    All other components within normal limits  BASIC METABOLIC PANEL - Abnormal; Notable for the following:    Glucose, Bld 203 (*)    All other components within normal limits   No results found.   No  diagnosis found.    MDM  Pelvic pain    The chart was scribed for me under my direct supervision.  I personally performed the history, physical, and medical decision making and all procedures in the evaluation  of this patient.Benny Lennert, MD 06/27/12 [phone]

## 2012-06-28 ENCOUNTER — Other Ambulatory Visit (HOSPITAL_COMMUNITY): Payer: Self-pay | Admitting: Emergency Medicine

## 2012-06-28 ENCOUNTER — Ambulatory Visit (HOSPITAL_COMMUNITY)
Admit: 2012-06-28 | Discharge: 2012-06-28 | Disposition: A | Discharge status: Home / Self Care | Payer: Self-pay | Source: Ambulatory Visit | Attending: Emergency Medicine | Admitting: Emergency Medicine

## 2012-06-28 DIAGNOSIS — R102 Pelvic and perineal pain: Secondary | ICD-10-CM

## 2012-06-28 DIAGNOSIS — N949 Unspecified condition associated with female genital organs and menstrual cycle: Secondary | ICD-10-CM | POA: Insufficient documentation

## 2012-08-14 ENCOUNTER — Encounter (HOSPITAL_COMMUNITY): Payer: Self-pay | Admitting: *Deleted

## 2012-08-14 ENCOUNTER — Emergency Department (HOSPITAL_COMMUNITY): Payer: Self-pay

## 2012-08-14 DIAGNOSIS — R000 Tachycardia, unspecified: Secondary | ICD-10-CM | POA: Insufficient documentation

## 2012-08-14 DIAGNOSIS — IMO0001 Reserved for inherently not codable concepts without codable children: Secondary | ICD-10-CM | POA: Insufficient documentation

## 2012-08-14 DIAGNOSIS — Z7982 Long term (current) use of aspirin: Secondary | ICD-10-CM | POA: Insufficient documentation

## 2012-08-14 DIAGNOSIS — E119 Type 2 diabetes mellitus without complications: Secondary | ICD-10-CM | POA: Insufficient documentation

## 2012-08-14 DIAGNOSIS — J4489 Other specified chronic obstructive pulmonary disease: Secondary | ICD-10-CM | POA: Insufficient documentation

## 2012-08-14 DIAGNOSIS — Z794 Long term (current) use of insulin: Secondary | ICD-10-CM | POA: Insufficient documentation

## 2012-08-14 DIAGNOSIS — I509 Heart failure, unspecified: Secondary | ICD-10-CM | POA: Insufficient documentation

## 2012-08-14 DIAGNOSIS — R062 Wheezing: Secondary | ICD-10-CM | POA: Insufficient documentation

## 2012-08-14 DIAGNOSIS — F17200 Nicotine dependence, unspecified, uncomplicated: Secondary | ICD-10-CM | POA: Insufficient documentation

## 2012-08-14 DIAGNOSIS — Z79899 Other long term (current) drug therapy: Secondary | ICD-10-CM | POA: Insufficient documentation

## 2012-08-14 DIAGNOSIS — J449 Chronic obstructive pulmonary disease, unspecified: Secondary | ICD-10-CM | POA: Insufficient documentation

## 2012-08-14 DIAGNOSIS — J40 Bronchitis, not specified as acute or chronic: Secondary | ICD-10-CM | POA: Insufficient documentation

## 2012-08-14 DIAGNOSIS — Z91040 Latex allergy status: Secondary | ICD-10-CM | POA: Insufficient documentation

## 2012-08-14 DIAGNOSIS — J3489 Other specified disorders of nose and nasal sinuses: Secondary | ICD-10-CM | POA: Insufficient documentation

## 2012-08-14 DIAGNOSIS — I10 Essential (primary) hypertension: Secondary | ICD-10-CM | POA: Insufficient documentation

## 2012-08-14 DIAGNOSIS — Z8679 Personal history of other diseases of the circulatory system: Secondary | ICD-10-CM | POA: Insufficient documentation

## 2012-08-14 DIAGNOSIS — R0602 Shortness of breath: Secondary | ICD-10-CM | POA: Insufficient documentation

## 2012-08-14 DIAGNOSIS — Z9861 Coronary angioplasty status: Secondary | ICD-10-CM | POA: Insufficient documentation

## 2012-08-14 MED ORDER — PREDNISONE 10 MG PO TABS: ORAL_TABLET | ORAL | Status: DC

## 2012-08-14 MED ORDER — PROMETHAZINE-CODEINE 6.25-10 MG/5ML PO SYRP: 5.0000 mL | ORAL_SOLUTION | ORAL | Status: DC | PRN

## 2012-08-14 MED ORDER — CEPHALEXIN 500 MG PO CAPS: 500.0000 mg | ORAL_CAPSULE | Freq: Four times a day (QID) | ORAL | Status: DC

## 2012-08-14 NOTE — ED Provider Notes (Signed)
CSN: [id number]     Arrival date & time 08/14/12  2229 History     First MD Initiated Contact with Patient 08/14/12 2342     Chief Complaint  Patient presents with  . Cough  . Nasal Congestion   (Consider location/radiation/quality/duration/timing/severity/associated sxs/prior Treatment) Patient is a 52 y.o. female presenting with cough. The history is provided by the patient.  Cough Cough characteristics:  Non-productive Severity:  Moderate Onset quality:  Gradual Duration:  1 week Timing:  Intermittent Progression:  Worsening Chronicity:  New Smoker: yes   Context: sick contacts, upper respiratory infection and weather changes   Relieved by:  Nothing Worsened by:  Nothing tried Ineffective treatments:  None tried Associated symptoms: myalgias, shortness of breath and wheezing   Associated symptoms: no chest pain and no eye discharge   Risk factors: no chemical exposure, no recent infection and no recent travel     Past Medical History  Diagnosis Date  . CHF (congestive heart failure)     minamal  . Cardiomyopathy   . Hypertension   . Diabetes mellitus   . Normal echocardiogram     LVEF 25-30% 03/27/2010  . COPD (chronic obstructive pulmonary disease)    Past Surgical History  Procedure Laterality Date  . Cardiac catheterization    . Tubal ligation     Family History  Problem Relation Age of Onset  . Stroke Mother   . Lung cancer Father    History  Substance Use Topics  . Smoking status: Current Every Day Smoker -- 1.50 packs/day    Types: Cigarettes  . Smokeless tobacco: Not on file  . Alcohol Use: No   OB History   Grav Para Term Preterm Abortions TAB SAB Ect Mult Living                 Review of Systems  Constitutional: Negative for activity change.       All ROS Neg except as noted in HPI  HENT: Negative for nosebleeds and neck pain.   Eyes: Negative for photophobia and discharge.  Respiratory: Positive for cough, shortness of breath and  wheezing.   Cardiovascular: Negative for chest pain and palpitations.  Gastrointestinal: Negative for abdominal pain and blood in stool.  Genitourinary: Negative for dysuria, frequency and hematuria.  Musculoskeletal: Positive for myalgias. Negative for back pain and arthralgias.  Skin: Negative.   Neurological: Negative for dizziness, seizures and speech difficulty.  Psychiatric/Behavioral: Negative for hallucinations and confusion.    Allergies  Latex  Home Medications   Current Outpatient Rx  Name  Route  Sig  Dispense  Refill  . acetaminophen (TYLENOL) 500 MG tablet   Oral   Take 1,000-1,500 mg by mouth daily as needed. Pain         . aspirin EC 81 MG tablet   Oral   Take 81 mg by mouth daily.         . diphenhydrAMINE (BENADRYL) 25 MG tablet   Oral   Take 25 mg by mouth every 6 (six) hours as needed. Allergies         . hydrochlorothiazide (HYDRODIURIL) 25 MG tablet   Oral   Take 1 tablet (25 mg total) by mouth daily.   90 tablet   1     Dose increase   . HYDROcodone-acetaminophen (NORCO/VICODIN) 5-325 MG per tablet   Oral   Take 1 tablet by mouth every 6 (six) hours as needed for pain.   20 tablet   0   .  ibuprofen (ADVIL,MOTRIN) 200 MG tablet   Oral   Take 200 mg by mouth every 8 (eight) hours as needed. pain         . insulin glargine (LANTUS) 100 UNIT/ML injection   Subcutaneous   Inject 24 Units into the skin at bedtime.          Marland Kitchen lisinopril (PRINIVIL,ZESTRIL) 10 MG tablet   Oral   Take 1 tablet (10 mg total) by mouth daily.   90 tablet   1   . metFORMIN (GLUCOPHAGE) 500 MG tablet   Oral   Take 1 tablet (500 mg total) by mouth 2 (two) times daily with a meal.   60 tablet   0   . potassium chloride (K-DUR) 10 MEQ tablet   Oral   Take 10 mEq by mouth daily.         . Pseudoeph-Doxylamine-DM-APAP (NITE TIME COLD PO)   Oral   Take 1 tablet by mouth at bedtime as needed. Cold Symptoms          BP 148/98  Pulse 117   Temp(Src) 98.7 F (37.1 C) (Oral)  Resp 18  Ht 4\' 11"  (1.499 m)  Wt 181 lb (82.101 kg)  BMI 36.54 kg/m2  SpO2 99%  LMP 08/02/2010 Physical Exam  Nursing note and vitals reviewed. Constitutional: She is oriented to person, place, and time. She appears well-developed and well-nourished.  Non-toxic appearance.  HENT:  Head: Normocephalic.  Right Ear: Tympanic membrane and external ear normal.  Left Ear: Tympanic membrane and external ear normal.  Eyes: EOM and lids are normal. Pupils are equal, round, and reactive to light.  Neck: Normal range of motion. Neck supple. Carotid bruit is not present.  Cardiovascular: Regular rhythm, normal heart sounds, intact distal pulses and normal pulses.  Tachycardia present.   Pulmonary/Chest: Breath sounds normal. No respiratory distress.  Course breath sounds present, no rales. Symmetrical rise and fall of the chest.  Abdominal: Soft. Bowel sounds are normal. There is no tenderness. There is no guarding.  Musculoskeletal: Normal range of motion.  Lymphadenopathy:       Head (right side): No submandibular adenopathy present.       Head (left side): No submandibular adenopathy present.    She has no cervical adenopathy.  Neurological: She is alert and oriented to person, place, and time. She has normal strength. No cranial nerve deficit or sensory deficit.  Skin: Skin is warm and dry.  Psychiatric: She has a normal mood and affect. Her speech is normal.    ED Course   Procedures (including critical care time)  Labs Reviewed - No data to display Dg Chest 2 View  08/14/2012   *RADIOLOGY REPORT*  Clinical Data: Cough and congestion, history of CHF, cardiomyopathy and COPD  CHEST - 2 VIEW  Comparison: 10/24/2010; 06/18/2010; chest CT - 06/18/2010  Findings:  Grossly unchanged borderline enlarged cardiac silhouette and mediastinal contours.  The lungs remain hyperexpanded with flattening of bilateral hemidiaphragms and mild diffuse thickening of the  interstitium.  No focal airspace opacities.  No pleural effusion or pneumothorax.  No definite evidence of edema. Unchanged bones.  IMPRESSION: Mild lung hyperexpansion and bronchitic change without acute cardiopulmonary disease.   Original Report Authenticated By: Tacey Ruiz, MD   No diagnosis found.  MDM  **I have reviewed nursing notes, vital signs, and all appropriate lab and imaging results for this patient.* Patient reports a week of congestion, as well as cough. Patient states that time she has  so much cough that she can hardly catch her breath. She's not had any high fever. There's been no hemoptysis. It is of note that the patient is a smoker, she is diabetic.  Chest x-ray reveals borderline cardiomegaly. There is mild lung hyperexpansion and bronchitic changes without acute cardiopulmonary disease.  The treatment plan at this time includes #1 doxycycline 1 tablet twice a day, #2 promethazine/codeine cough medication every 4 hours as needed for cough. #3 albuterol inhaler 2 puffs every 4 hours given to the patient in the emergency department. #4 albuterol taper. Patient to followup with Dr. Delbert Harness next week for followup and recheck.  Kathie Dike, PA-C 08/14/12 2349

## 2012-08-14 NOTE — ED Notes (Addendum)
Pt c/o cough and chest congestion x 1 wk. Pt states she coughs so much she cant catch her breath.

## 2012-08-15 ENCOUNTER — Emergency Department (HOSPITAL_COMMUNITY)
Admission: EM | Admit: 2012-08-15 | Discharge: 2012-08-15 | Disposition: A | Discharge status: Home / Self Care | Payer: Self-pay | Attending: Emergency Medicine | Admitting: Emergency Medicine

## 2012-08-15 DIAGNOSIS — J40 Bronchitis, not specified as acute or chronic: Secondary | ICD-10-CM

## 2012-08-15 MED ORDER — HYDROCOD POLST-CHLORPHEN POLST 10-8 MG/5ML PO LQCR
5.0000 mL | Freq: Once | ORAL | Status: AC
  Administered 2012-08-15: 5 mL via ORAL
  Filled 2012-08-15: qty 5

## 2012-08-15 MED ORDER — PREDNISONE 50 MG PO TABS
60.0000 mg | ORAL_TABLET | Freq: Once | ORAL | Status: AC
  Administered 2012-08-15: 60 mg via ORAL
  Filled 2012-08-15: qty 1

## 2012-08-15 MED ORDER — CEPHALEXIN 500 MG PO CAPS
500.0000 mg | ORAL_CAPSULE | Freq: Once | ORAL | Status: AC
  Administered 2012-08-15: 500 mg via ORAL
  Filled 2012-08-15: qty 1

## 2012-08-15 MED ORDER — ALBUTEROL SULFATE HFA 108 (90 BASE) MCG/ACT IN AERS
2.0000 | INHALATION_SPRAY | RESPIRATORY_TRACT | Status: DC
  Administered 2012-08-15: 2 via RESPIRATORY_TRACT
  Filled 2012-08-15: qty 6.7

## 2012-08-15 NOTE — ED Notes (Signed)
Called Resp for medication.

## 2012-08-15 NOTE — ED Provider Notes (Signed)
Medical screening examination/treatment/procedure(s) were performed by non-physician practitioner and as supervising physician I was immediately available for consultation/collaboration.  Dollie Mayse S. Lilli Dewald, MD 08/15/12 0651 

## 2012-08-15 NOTE — ED Notes (Signed)
Patient given discharge instruction, verbalized understand. Patient ambulatory out of the department.  

## 2012-09-24 ENCOUNTER — Encounter (HOSPITAL_COMMUNITY): Payer: Self-pay | Admitting: Emergency Medicine

## 2012-09-24 ENCOUNTER — Emergency Department (HOSPITAL_COMMUNITY): Payer: Self-pay

## 2012-09-24 ENCOUNTER — Emergency Department (HOSPITAL_COMMUNITY)
Admission: EM | Admit: 2012-09-24 | Discharge: 2012-09-24 | Disposition: A | Discharge status: Home / Self Care | Payer: Self-pay | Attending: Emergency Medicine | Admitting: Emergency Medicine

## 2012-09-24 DIAGNOSIS — F17200 Nicotine dependence, unspecified, uncomplicated: Secondary | ICD-10-CM | POA: Insufficient documentation

## 2012-09-24 DIAGNOSIS — Z91040 Latex allergy status: Secondary | ICD-10-CM | POA: Insufficient documentation

## 2012-09-24 DIAGNOSIS — E119 Type 2 diabetes mellitus without complications: Secondary | ICD-10-CM | POA: Insufficient documentation

## 2012-09-24 DIAGNOSIS — Z7982 Long term (current) use of aspirin: Secondary | ICD-10-CM | POA: Insufficient documentation

## 2012-09-24 DIAGNOSIS — Z794 Long term (current) use of insulin: Secondary | ICD-10-CM | POA: Insufficient documentation

## 2012-09-24 DIAGNOSIS — J4489 Other specified chronic obstructive pulmonary disease: Secondary | ICD-10-CM | POA: Insufficient documentation

## 2012-09-24 DIAGNOSIS — J449 Chronic obstructive pulmonary disease, unspecified: Secondary | ICD-10-CM | POA: Insufficient documentation

## 2012-09-24 DIAGNOSIS — Z792 Long term (current) use of antibiotics: Secondary | ICD-10-CM | POA: Insufficient documentation

## 2012-09-24 DIAGNOSIS — I509 Heart failure, unspecified: Secondary | ICD-10-CM | POA: Insufficient documentation

## 2012-09-24 DIAGNOSIS — I10 Essential (primary) hypertension: Secondary | ICD-10-CM | POA: Insufficient documentation

## 2012-09-24 DIAGNOSIS — J020 Streptococcal pharyngitis: Secondary | ICD-10-CM | POA: Insufficient documentation

## 2012-09-24 LAB — POCT I-STAT, CHEM 8
BUN: 9 mg/dL (ref 6–23)
Calcium, Ion: 1.12 mmol/L (ref 1.12–1.23)
Chloride: 102 mEq/L (ref 96–112)
Creatinine, Ser: 0.8 mg/dL (ref 0.50–1.10)
TCO2: 24 mmol/L (ref 0–100)

## 2012-09-24 MED ORDER — OXYCODONE-ACETAMINOPHEN 5-325 MG PO TABS: ORAL_TABLET | ORAL | Status: DC

## 2012-09-24 MED ORDER — MORPHINE SULFATE 4 MG/ML IJ SOLN
4.0000 mg | INTRAMUSCULAR | Status: AC | PRN
  Administered 2012-09-24 (×2): 4 mg via INTRAVENOUS
  Filled 2012-09-24 (×2): qty 1

## 2012-09-24 MED ORDER — CLINDAMYCIN PHOSPHATE 900 MG/50ML IV SOLN
900.0000 mg | Freq: Once | INTRAVENOUS | Status: AC
  Administered 2012-09-24: 900 mg via INTRAVENOUS
  Filled 2012-09-24: qty 50

## 2012-09-24 MED ORDER — IOHEXOL 300 MG/ML  SOLN: 75.0000 mL | Freq: Once | INTRAMUSCULAR | Status: DC | PRN

## 2012-09-24 MED ORDER — DEXAMETHASONE SODIUM PHOSPHATE 4 MG/ML IJ SOLN
10.0000 mg | Freq: Once | INTRAMUSCULAR | Status: AC
  Administered 2012-09-24: 10 mg via INTRAVENOUS
  Filled 2012-09-24: qty 3

## 2012-09-24 MED ORDER — CLINDAMYCIN HCL 150 MG PO CAPS: ORAL_CAPSULE | ORAL | Status: DC

## 2012-09-24 MED ORDER — SODIUM CHLORIDE 0.9 % IV SOLN
INTRAVENOUS | Status: DC
  Administered 2012-09-24: 1000 mL via INTRAVENOUS

## 2012-09-24 MED ORDER — ACETAMINOPHEN 160 MG/5ML PO SOLN
650.0000 mg | Freq: Once | ORAL | Status: AC
  Administered 2012-09-24: 650 mg via ORAL
  Filled 2012-09-24: qty 20.3

## 2012-09-24 NOTE — ED Notes (Signed)
Patient complaining of sore throat since Tuesday along with painful swallowing and neck pain. Reports hurts only on right side. States hasn't been eating or drinking due to pain.

## 2012-09-24 NOTE — ED Provider Notes (Signed)
CSN: [id number]     Arrival date & time 09/24/12  0707 History   First MD Initiated Contact with Patient 09/24/12 0725     Chief Complaint  Patient presents with  . Sore Throat  . Neck Pain    HPI Pt was seen at 0725. Per pt, c/o gradual onset and worsening of persistent sore throat for the past 2 days. Has been associated with subjective home fevers/chills and decreased PO intake due to the pain. States the pain is "worse on the right side" of her throat. Pain worsens with swallowing. Denies hoarse voice, no drooling/stridor, no SOB/cough, no rash, no CP/palpitations, no abd pain, no N/V/D, no rash.    Past Medical History  Diagnosis Date  . CHF (congestive heart failure)     minamal  . Cardiomyopathy   . Hypertension   . Diabetes mellitus   . Normal echocardiogram     LVEF 25-30% 03/27/2010  . COPD (chronic obstructive pulmonary disease)    Past Surgical History  Procedure Laterality Date  . Cardiac catheterization    . Tubal ligation     Family History  Problem Relation Age of Onset  . Stroke Mother   . Lung cancer Father    History  Substance Use Topics  . Smoking status: Current Every Day Smoker -- 1.50 packs/day    Types: Cigarettes  . Smokeless tobacco: Not on file  . Alcohol Use: No    Review of Systems ROS: Statement: All systems negative except as marked or noted in the HPI; Constitutional: +subjective fever and chills. ; ; Eyes: Negative for eye pain, redness and discharge. ; ; ENMT: Negative for ear pain, hoarseness, nasal congestion, sinus pressure and +sore throat. ; ; Cardiovascular: Negative for chest pain, palpitations, diaphoresis, dyspnea and peripheral edema. ; ; Respiratory: Negative for cough, wheezing and stridor. ; ; Gastrointestinal: Negative for nausea, vomiting, diarrhea, abdominal pain, blood in stool, hematemesis, jaundice and rectal bleeding. . ; ; Genitourinary: Negative for dysuria, flank pain and hematuria. ; ; Musculoskeletal: Negative for back  pain and neck pain. Negative for swelling and trauma.; ; Skin: Negative for pruritus, rash, abrasions, blisters, bruising and skin lesion.; ; Neuro: Negative for headache, lightheadedness and neck stiffness. Negative for weakness, altered level of consciousness , altered mental status, extremity weakness, paresthesias, involuntary movement, seizure and syncope.      Allergies  Latex  Home Medications   Current Outpatient Rx  Name  Route  Sig  Dispense  Refill  . acetaminophen (TYLENOL) 500 MG tablet   Oral   Take 1,000-1,500 mg by mouth daily as needed. Pain         . aspirin EC 81 MG tablet   Oral   Take 81 mg by mouth daily.         . cephALEXin (KEFLEX) 500 MG capsule   Oral   Take 1 capsule (500 mg total) by mouth 4 (four) times daily.   20 capsule   0   . diphenhydrAMINE (BENADRYL) 25 MG tablet   Oral   Take 25 mg by mouth every 6 (six) hours as needed. Allergies         . hydrochlorothiazide (HYDRODIURIL) 25 MG tablet   Oral   Take 1 tablet (25 mg total) by mouth daily.   90 tablet   1     Dose increase   . HYDROcodone-acetaminophen (NORCO/VICODIN) 5-325 MG per tablet   Oral   Take 1 tablet by mouth every 6 (six) hours  as needed for pain.   20 tablet   0   . ibuprofen (ADVIL,MOTRIN) 200 MG tablet   Oral   Take 200 mg by mouth every 8 (eight) hours as needed. pain         . insulin glargine (LANTUS) 100 UNIT/ML injection   Subcutaneous   Inject 24 Units into the skin at bedtime.          Marland Kitchen lisinopril (PRINIVIL,ZESTRIL) 10 MG tablet   Oral   Take 1 tablet (10 mg total) by mouth daily.   90 tablet   1   . metFORMIN (GLUCOPHAGE) 500 MG tablet   Oral   Take 1 tablet (500 mg total) by mouth 2 (two) times daily with a meal.   60 tablet   0   . potassium chloride (K-DUR) 10 MEQ tablet   Oral   Take 10 mEq by mouth daily.         . predniSONE (DELTASONE) 10 MG tablet      5,4,3,2,1, - take with food   15 tablet   0   .  promethazine-codeine (PHENERGAN WITH CODEINE) 6.25-10 MG/5ML syrup   Oral   Take 5 mLs by mouth every 4 (four) hours as needed for cough.   150 mL   0   . Pseudoeph-Doxylamine-DM-APAP (NITE TIME COLD PO)   Oral   Take 1 tablet by mouth at bedtime as needed. Cold Symptoms          BP 141/103  Pulse 132  Temp(Src) 100.4 F (38 C) (Oral)  Resp 24  Ht 4\' 11"  (1.499 m)  Wt 170 lb (77.111 kg)  BMI 34.32 kg/m2  SpO2 96%  LMP 08/02/2010 Physical Exam 0730: Physical examination:  Nursing notes reviewed; Vital signs and O2 SAT reviewed;  Constitutional: Well developed, Well nourished, Uncomfortable appearing; Head:  Normocephalic, atraumatic; Eyes: EOMI, PERRL, No scleral icterus; ENMT: TM's clear bilat. +edemetous nasal turbinates bilat with clear rhinorrhea. +mild posterior pharyngeal erythema. No soft palate bulging. Mouth and pharynx without lesions. No tonsillar exudates. No intra-oral edema. No submandibular or sublingual edema. No hoarse voice, no drooling, no stridor. No pain with manipulation of larynx. Mucous membranes dry; Neck: Supple, Full range of motion; Cardiovascular: Tachycardic rate and rhythm, No gallop; Respiratory: Breath sounds clear & equal bilaterally, No rales, rhonchi, wheezes.  Speaking full sentences with ease, Normal respiratory effort/excursion; Chest: Nontender, Movement normal; Abdomen: Soft, Nontender, Nondistended, Normal bowel sounds; Genitourinary: No CVA tenderness; Extremities: Pulses normal, No tenderness, No edema, No calf edema or asymmetry.; Neuro: AA&Ox3, Major CN grossly intact.  Speech clear. No gross focal motor or sensory deficits in extremities.; Skin: Color normal, Warm, Dry.   ED Course  Procedures  MDM  MDM Reviewed: previous chart, nursing note and vitals Reviewed previous: labs Interpretation: labs and CT scan     Results for orders placed during the hospital encounter of 09/24/12  RAPID STREP SCREEN      Result Value Range    Streptococcus, Group A Screen (Direct) POSITIVE (*) NEGATIVE  POCT I-STAT, CHEM 8      Result Value Range   Sodium 135  135 - 145 mEq/L   Potassium 4.1  3.5 - 5.1 mEq/L   Chloride 102  96 - 112 mEq/L   BUN 9  6 - 23 mg/dL   Creatinine, Ser 7.82  0.50 - 1.10 mg/dL   Glucose, Bld 956 (*) 70 - 99 mg/dL   Calcium, Ion 2.13  0.86 - 1.23 mmol/L  TCO2 24  0 - 100 mmol/L   Hemoglobin 16.7 (*) 12.0 - 15.0 g/dL   HCT 66.4 (*) 40.3 - 47.4 %   Ct Soft Tissue Neck W Contrast 09/24/2012   **ADDENDUM** CREATED: 09/24/2012 11:00:26  Impression #2 should read:  Asymmetric area of hypoattenuation involving the right aryepiglottic fold is concerning for phlegmon without a discrete abscess.  Neoplasm is considered less likely. The area would be amendable to direct visualization with laryngoscopy  **END ADDENDUM** SIGNED BY: Chauncey Fischer, M.D.  09/24/2012   *RADIOLOGY REPORT*  Clinical Data: Sore throat and neck pain.  CT NECK WITH CONTRAST  Technique:  Multidetector CT imaging of the neck was performed with intravenous contrast.  Contrast:  75 ml Omnipaque 300  Comparison: None.  Findings: The adenoid tissue is markedly enlarged, measuring 20 mm from the clivus.  There is a focal area of hypoattenuation in the more superior and posterior adenoid tissue measuring 8 x 11 mm. This is in the midline.  The palatine tonsils are enlarged bilaterally.  The airway is patent.  Asymmetric soft tissue edema or less likely masses present along the right aryepiglottic fold. The area in question measures 2.3 x 2.1 x 2.2 cm.  The vocal cords are midline and symmetric.  The thyroid is unremarkable.  Enlarged right level III lymph node measures 2.8 x 1.3 x 2.0 cm. Borderline level II lymph nodes are present bilaterally.  The cartilages of the neck are intact.  The lung apices are clear.  Bone windows are unremarkable.  IMPRESSION:  1.  Enlarged adenoid tissue and palatine tonsils suggests an inflammatory process. 2.  Asymmetric  area of hypoattenuation involving the right aryepiglottic fold is concerning for flight nine without a discrete abscess.  Neoplasm is considered less likely.  The area would be amendable to direct visualization with laparoscopy. 3.  Enlarged bilateral cervical lymph nodes, most prominent in the right level III station.  Given the overall picture, these are likely reactive although neoplasm cannot be excluded.   Original Report Authenticated By: Marin Roberts, M.D.    1300:  Pt has tolerated a meal PO without choking or gagging. IV clindamycin given, will rx same. No acute abscess found on CT scan. Pt states she feels "better now" and wants to go home. Dx and testing d/w pt.  Questions answered.  Verb understanding, agreeable to d/c home with outpt f/u with ENT within the next week.   Laray Anger, DO 09/27/12 1203

## 2013-01-11 ENCOUNTER — Encounter (HOSPITAL_COMMUNITY): Payer: Self-pay | Admitting: Emergency Medicine

## 2013-01-11 ENCOUNTER — Emergency Department (HOSPITAL_COMMUNITY): Payer: No Typology Code available for payment source

## 2013-01-11 ENCOUNTER — Emergency Department (HOSPITAL_COMMUNITY)
Admission: EM | Admit: 2013-01-11 | Discharge: 2013-01-11 | Disposition: A | Discharge status: Home / Self Care | Payer: No Typology Code available for payment source | Attending: Emergency Medicine | Admitting: Emergency Medicine

## 2013-01-11 DIAGNOSIS — Y92410 Unspecified street and highway as the place of occurrence of the external cause: Secondary | ICD-10-CM | POA: Insufficient documentation

## 2013-01-11 DIAGNOSIS — S39012A Strain of muscle, fascia and tendon of lower back, initial encounter: Secondary | ICD-10-CM

## 2013-01-11 DIAGNOSIS — Z91040 Latex allergy status: Secondary | ICD-10-CM | POA: Insufficient documentation

## 2013-01-11 DIAGNOSIS — I509 Heart failure, unspecified: Secondary | ICD-10-CM | POA: Insufficient documentation

## 2013-01-11 DIAGNOSIS — E119 Type 2 diabetes mellitus without complications: Secondary | ICD-10-CM | POA: Insufficient documentation

## 2013-01-11 DIAGNOSIS — Y9389 Activity, other specified: Secondary | ICD-10-CM | POA: Insufficient documentation

## 2013-01-11 DIAGNOSIS — F17200 Nicotine dependence, unspecified, uncomplicated: Secondary | ICD-10-CM | POA: Insufficient documentation

## 2013-01-11 DIAGNOSIS — J449 Chronic obstructive pulmonary disease, unspecified: Secondary | ICD-10-CM | POA: Insufficient documentation

## 2013-01-11 DIAGNOSIS — I10 Essential (primary) hypertension: Secondary | ICD-10-CM | POA: Insufficient documentation

## 2013-01-11 DIAGNOSIS — J4489 Other specified chronic obstructive pulmonary disease: Secondary | ICD-10-CM | POA: Insufficient documentation

## 2013-01-11 DIAGNOSIS — Z794 Long term (current) use of insulin: Secondary | ICD-10-CM | POA: Insufficient documentation

## 2013-01-11 DIAGNOSIS — S335XXA Sprain of ligaments of lumbar spine, initial encounter: Secondary | ICD-10-CM | POA: Insufficient documentation

## 2013-01-11 DIAGNOSIS — Z95818 Presence of other cardiac implants and grafts: Secondary | ICD-10-CM | POA: Insufficient documentation

## 2013-01-11 DIAGNOSIS — Z79899 Other long term (current) drug therapy: Secondary | ICD-10-CM | POA: Insufficient documentation

## 2013-01-11 LAB — GLUCOSE, CAPILLARY: Glucose-Capillary: 354 mg/dL — ABNORMAL HIGH (ref 70–99)
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
## 2013-01-11 NOTE — ED Notes (Signed)
Patient with no complaints at this time. Respirations even and unlabored. Skin warm/dry. Discharge instructions reviewed with patient at this time. Patient given opportunity to voice concerns/ask questions. Patient discharged at this time and left Emergency Department with steady gait.   
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
## 2013-04-02 ENCOUNTER — Emergency Department (HOSPITAL_COMMUNITY)
Admission: EM | Admit: 2013-04-02 | Discharge: 2013-04-02 | Disposition: A | Discharge status: Home / Self Care | Payer: Self-pay | Attending: Emergency Medicine | Admitting: Emergency Medicine

## 2013-04-02 ENCOUNTER — Encounter (HOSPITAL_COMMUNITY): Payer: Self-pay | Admitting: Emergency Medicine

## 2013-04-02 ENCOUNTER — Emergency Department (HOSPITAL_COMMUNITY): Payer: Self-pay

## 2013-04-02 DIAGNOSIS — E119 Type 2 diabetes mellitus without complications: Secondary | ICD-10-CM | POA: Insufficient documentation

## 2013-04-02 DIAGNOSIS — I509 Heart failure, unspecified: Secondary | ICD-10-CM | POA: Insufficient documentation

## 2013-04-02 DIAGNOSIS — F17200 Nicotine dependence, unspecified, uncomplicated: Secondary | ICD-10-CM | POA: Insufficient documentation

## 2013-04-02 DIAGNOSIS — Z91199 Patient's noncompliance with other medical treatment and regimen due to unspecified reason: Secondary | ICD-10-CM | POA: Insufficient documentation

## 2013-04-02 DIAGNOSIS — J441 Chronic obstructive pulmonary disease with (acute) exacerbation: Secondary | ICD-10-CM | POA: Insufficient documentation

## 2013-04-02 DIAGNOSIS — R059 Cough, unspecified: Secondary | ICD-10-CM

## 2013-04-02 DIAGNOSIS — Z9119 Patient's noncompliance with other medical treatment and regimen: Secondary | ICD-10-CM | POA: Insufficient documentation

## 2013-04-02 DIAGNOSIS — Z794 Long term (current) use of insulin: Secondary | ICD-10-CM | POA: Insufficient documentation

## 2013-04-02 DIAGNOSIS — Z91040 Latex allergy status: Secondary | ICD-10-CM | POA: Insufficient documentation

## 2013-04-02 DIAGNOSIS — Z79899 Other long term (current) drug therapy: Secondary | ICD-10-CM | POA: Insufficient documentation

## 2013-04-02 DIAGNOSIS — R05 Cough: Secondary | ICD-10-CM

## 2013-04-02 DIAGNOSIS — Z9889 Other specified postprocedural states: Secondary | ICD-10-CM | POA: Insufficient documentation

## 2013-04-02 DIAGNOSIS — R739 Hyperglycemia, unspecified: Secondary | ICD-10-CM

## 2013-04-02 DIAGNOSIS — I10 Essential (primary) hypertension: Secondary | ICD-10-CM | POA: Insufficient documentation

## 2013-04-02 HISTORY — DX: Patient's other noncompliance with medication regimen for other reason: Z91.148

## 2013-04-02 HISTORY — DX: Patient's other noncompliance with medication regimen: Z91.14

## 2013-04-02 LAB — I-STAT CHEM 8, ED
BUN: 16 mg/dL (ref 6–23)
Calcium, Ion: 1.25 mmol/L — ABNORMAL HIGH (ref 1.12–1.23)
Chloride: 98 mEq/L (ref 96–112)
Creatinine, Ser: 0.7 mg/dL (ref 0.50–1.10)
Glucose, Bld: 302 mg/dL — ABNORMAL HIGH (ref 70–99)
HEMATOCRIT: 51 % — AB (ref 36.0–46.0)
HEMOGLOBIN: 17.3 g/dL — AB (ref 12.0–15.0)
Potassium: 3.6 mEq/L — ABNORMAL LOW (ref 3.7–5.3)
SODIUM: 140 meq/L (ref 137–147)
TCO2: 26 mmol/L (ref 0–100)

## 2013-04-02 LAB — CBG MONITORING, ED: Glucose-Capillary: 342 mg/dL — ABNORMAL HIGH (ref 70–99)

## 2013-04-02 MED ORDER — ALBUTEROL SULFATE HFA 108 (90 BASE) MCG/ACT IN AERS
2.0000 | INHALATION_SPRAY | Freq: Once | RESPIRATORY_TRACT | Status: AC
  Administered 2013-04-02: 2 via RESPIRATORY_TRACT
  Filled 2013-04-02: qty 6.7

## 2013-04-02 MED ORDER — GUAIFENESIN-CODEINE 100-10 MG/5ML PO SYRP: 10.0000 mL | ORAL_SOLUTION | Freq: Three times a day (TID) | ORAL | Status: DC | PRN

## 2013-04-02 MED ORDER — ALBUTEROL SULFATE (2.5 MG/3ML) 0.083% IN NEBU
2.5000 mg | INHALATION_SOLUTION | Freq: Once | RESPIRATORY_TRACT | Status: AC
  Administered 2013-04-02: 2.5 mg via RESPIRATORY_TRACT
  Filled 2013-04-02: qty 3

## 2013-04-02 MED ORDER — METFORMIN HCL 500 MG PO TABS: 500.0000 mg | ORAL_TABLET | Freq: Two times a day (BID) | ORAL | Status: DC

## 2013-04-02 NOTE — ED Notes (Signed)
Productive cough and sob x 2 days.

## 2013-04-02 NOTE — ED Notes (Signed)
Pt received discharge instructions and prescriptions, verbalized understanding and has no further questions. Pt ambulated to exit in stable condition.  Advised to return to emergency department with new or worsening symptoms.  

## 2013-04-02 NOTE — ED Notes (Signed)
Pt reports has been out of diabetic medications x 6 months.

## 2013-04-02 NOTE — Discharge Instructions (Signed)
Cough, Adult  A cough is a reflex. It helps you clear your throat and airways. A cough can help heal your body. A cough can last 2 or 3 weeks (acute) or may last more than 8 weeks (chronic). Some common causes of a cough can include an infection, allergy, or a cold. HOME CARE  Only take medicine as told by your doctor.  If given, take your medicines (antibiotics) as told. Finish them even if you start to feel better.  Use a cold steam vaporizer or humidier in your home. This can help loosen thick spit (secretions).  Sleep so you are almost sitting up (semi-upright). Use pillows to do this. This helps reduce coughing.  Rest as needed.  Stop smoking if you smoke. GET HELP RIGHT AWAY IF:  You have yellowish-white fluid (pus) in your thick spit.  Your cough gets worse.  Your medicine does not reduce coughing, and you are losing sleep.  You cough up blood.  You have trouble breathing.  Your pain gets worse and medicine does not help.  You have a fever. MAKE SURE YOU:   Understand these instructions.  Will watch your condition.  Will get help right away if you are not doing well or get worse. Document Released: 09/20/2010 Document Revised: 04/01/2011 Document Reviewed: 09/20/2010 [facility] Patient Information 2014 [place], [place].  High Blood Sugar High blood sugar (hyperglycemia) means that the level of sugar in your blood is higher than it should be. Signs of high blood sugar include:  Feeling thirsty.  Frequent peeing (urinating).  Feeling tired or sleepy.  Dry mouth.  Vision changes.  Feeling weak.  Feeling hungry but losing weight.  Numbness and tingling in your hands or feet.  Headache. When you ignore these signs, your blood sugar may keep going up. These problems may get worse, and other problems may begin. HOME CARE  Check your blood sugars as told by your doctor. Write down the numbers with the date and time.  Take the right amount of insulin or  diabetes pills at the right time. Write down the dose with date and time.  Refill your insulin or diabetes pills before running out.  Watch what you eat. Follow your meal plan.  Drink liquids without sugar, such as water. Check with your doctor if you have kidney or heart disease.  Follow your doctor's orders for exercise. Exercise at the same time of day.  Keep your doctor's appointments. GET HELP RIGHT AWAY IF:   You have trouble thinking or are confused.  You have fast breathing with fruity smelling breath.  You pass out (faint).  You have 2 to 3 days of high blood sugars and you do not know why.  You have chest pain.  You are feeling sick to your stomach (nauseous) or throwing up (vomiting).  You have sudden vision changes. MAKE SURE YOU:   Understand these instructions.  Will watch your condition.  Will get help right away if you are not doing well or get worse. Document Released: 11/04/2008 Document Revised: 04/01/2011 Document Reviewed: 11/04/2008 [facility] Patient Information 2014 [place], [place].

## 2013-04-03 ENCOUNTER — Encounter (HOSPITAL_COMMUNITY): Payer: Self-pay | Admitting: Emergency Medicine

## 2013-04-04 NOTE — ED Provider Notes (Signed)
CSN: [id number]     Arrival date & time 04/02/13  1009 History   First MD Initiated Contact with Patient 04/02/13 1111     Chief Complaint  Patient presents with  . Cough     (Consider location/radiation/quality/duration/timing/severity/associated sxs/prior Treatment) Patient is a 53 y.o. female presenting with cough. The history is provided by the patient.  Cough Cough characteristics:  Productive Sputum characteristics:  Yellow and green Severity:  Moderate Onset quality:  Gradual Duration:  2 days Timing:  Intermittent Progression:  Worsening Chronicity:  New Smoker: yes   Context: upper respiratory infection   Relieved by:  Nothing Worsened by:  Nothing tried Ineffective treatments:  None tried Associated symptoms: rhinorrhea and shortness of breath   Associated symptoms: no chest pain, no chills, no ear pain, no fever, no headaches, no myalgias, no rash, no sore throat and no wheezing   Associated symptoms comment:  Patient also c/o shortness of breath but only with excessive coughing.  Denies chest pain Risk factors: no recent infection and no recent travel     Past Medical History  Diagnosis Date  . CHF (congestive heart failure)     minamal  . Cardiomyopathy   . Hypertension   . Diabetes mellitus   . Normal echocardiogram     LVEF 25-30% 03/27/2010  . COPD (chronic obstructive pulmonary disease)   . Non compliance w medication regimen    Past Surgical History  Procedure Laterality Date  . Cardiac catheterization    . Tubal ligation     Family History  Problem Relation Age of Onset  . Stroke Mother   . Lung cancer Father    History  Substance Use Topics  . Smoking status: Current Every Day Smoker -- 1.50 packs/day    Types: Cigarettes  . Smokeless tobacco: Not on file  . Alcohol Use: No   OB History   Grav Para Term Preterm Abortions TAB SAB Ect Mult Living                 Review of Systems  Constitutional: Negative for fever, chills, activity  change and appetite change.  HENT: Positive for congestion and rhinorrhea. Negative for ear pain, facial swelling, sore throat and trouble swallowing.   Eyes: Negative for visual disturbance.  Respiratory: Positive for cough and shortness of breath. Negative for wheezing and stridor.   Cardiovascular: Negative for chest pain.  Gastrointestinal: Negative for nausea, vomiting and abdominal pain.  Musculoskeletal: Negative for myalgias, neck pain and neck stiffness.  Skin: Negative.  Negative for rash.  Neurological: Negative for dizziness, weakness, numbness and headaches.  Hematological: Negative for adenopathy.  Psychiatric/Behavioral: Negative for confusion.  All other systems reviewed and are negative.      Allergies  Latex  Home Medications   Current Outpatient Rx  Name  Route  Sig  Dispense  Refill  . ibuprofen (ADVIL,MOTRIN) 200 MG tablet   Oral   Take 200 mg by mouth every 8 (eight) hours as needed. pain         . guaiFENesin-codeine (ROBITUSSIN AC) 100-10 MG/5ML syrup   Oral   Take 10 mLs by mouth 3 (three) times daily as needed for cough.   120 mL   0   . insulin glargine (LANTUS) 100 UNIT/ML injection   Subcutaneous   Inject 24 Units into the skin at bedtime.          Marland Kitchen. lisinopril (PRINIVIL,ZESTRIL) 10 MG tablet   Oral   Take 1 tablet (10  mg total) by mouth daily.   90 tablet   1   . metFORMIN (GLUCOPHAGE) 500 MG tablet   Oral   Take 1 tablet (500 mg total) by mouth 2 (two) times daily with a meal.   60 tablet   0   . metFORMIN (GLUCOPHAGE) 500 MG tablet   Oral   Take 1 tablet (500 mg total) by mouth 2 (two) times daily with a meal.   60 tablet   0   . potassium chloride (K-DUR) 10 MEQ tablet   Oral   Take 10 mEq by mouth daily.          BP 157/91  Pulse 87  Temp(Src) 98.8 F (37.1 C) (Oral)  Resp 16  Ht 4\' 11"  (1.499 m)  Wt 146 lb (66.225 kg)  BMI 29.47 kg/m2  SpO2 100%  LMP 08/02/2010 Physical Exam  Nursing note and vitals  reviewed. Constitutional: She is oriented to person, place, and time. She appears well-developed and well-nourished. No distress.  HENT:  Head: Normocephalic and atraumatic.  Right Ear: Tympanic membrane and ear canal normal.  Left Ear: Tympanic membrane and ear canal normal.  Mouth/Throat: Uvula is midline and mucous membranes are normal. Posterior oropharyngeal erythema present. No oropharyngeal exudate, posterior oropharyngeal edema or tonsillar abscesses.  Eyes: EOM are normal. Pupils are equal, round, and reactive to light.  Neck: Normal range of motion, full passive range of motion without pain and phonation normal. Neck supple.  Cardiovascular: Normal rate, regular rhythm, normal heart sounds and intact distal pulses.   No murmur heard. Pulmonary/Chest: Effort normal. No stridor. No respiratory distress. She has no rales. She exhibits no tenderness.  Coarse lungs sounds bilaterally with occasional expiratory wheezes.  No rales  Musculoskeletal: She exhibits no edema.  Lymphadenopathy:    She has no cervical adenopathy.  Neurological: She is alert and oriented to person, place, and time. She exhibits normal muscle tone. Coordination normal.  Skin: Skin is warm and dry. No rash noted.    ED Course  Procedures (including critical care time) Labs Review Labs Reviewed  CBG MONITORING, ED - Abnormal; Notable for the following:    Glucose-Capillary 342 (*)    All other components within normal limits  I-STAT CHEM 8, ED - Abnormal; Notable for the following:    Potassium 3.6 (*)    Glucose, Bld 302 (*)    Calcium, Ion 1.25 (*)    Hemoglobin 17.3 (*)    HCT 51.0 (*)    All other components within normal limits   Imaging Review Dg Chest 2 View  04/02/2013   CLINICAL DATA:  Cough and congestion  EXAM: CHEST  2 VIEW  COMPARISON:  08/14/2012  FINDINGS: Stable mild central bronchitic change without focal pneumonia, collapse or consolidation. Normal heart size and vascularity. No  effusion, edema, pneumothorax. Trachea midline.  IMPRESSION: Stable mild bronchitic change.  No interval change or acute process   Electronically Signed   By: Ruel Favors M.D.   On: 04/02/2013 11:17     EKG Interpretation None      MDM   Final diagnoses:  Cough  Hyperglycemia    Patient is well appearing, VSS. patient watching TV.  No tachycardia, tachypnea or hypoxia.  No concerns for PE.  No respiratory distress.  Hx of diabetes and has been w/o medications for approx 6 months due to lack of finances.  Will check I stat although pt appears well hydrated and no clinical signs of DKA  Pt  agrees to close f/u , given referral info for [facility] and I will prescribe metformin and robitussin AC for cough, dispensed albuterol inhaler.  She is feeling better and appears stable for d/c.  She also agrees to return here if sx's worsen.       Cortne Amara L. Geralda Baumgardner, PA-C 04/04/13 0030

## 2013-04-05 NOTE — ED Provider Notes (Signed)
Medical screening examination/treatment/procedure(s) were performed by non-physician practitioner and as supervising physician I was immediately available for consultation/collaboration.   EKG Interpretation None        Karri Kallenbach M Eyva Califano, DO 04/05/13 1819 

## 2013-07-05 ENCOUNTER — Emergency Department (HOSPITAL_COMMUNITY)
Admission: EM | Admit: 2013-07-05 | Discharge: 2013-07-05 | Disposition: A | Discharge status: Home / Self Care | Payer: No Typology Code available for payment source | Attending: Emergency Medicine | Admitting: Emergency Medicine

## 2013-07-05 ENCOUNTER — Encounter (HOSPITAL_COMMUNITY): Payer: Self-pay | Admitting: Emergency Medicine

## 2013-07-05 DIAGNOSIS — I10 Essential (primary) hypertension: Secondary | ICD-10-CM | POA: Insufficient documentation

## 2013-07-05 DIAGNOSIS — F17200 Nicotine dependence, unspecified, uncomplicated: Secondary | ICD-10-CM | POA: Insufficient documentation

## 2013-07-05 DIAGNOSIS — E119 Type 2 diabetes mellitus without complications: Secondary | ICD-10-CM

## 2013-07-05 DIAGNOSIS — Z794 Long term (current) use of insulin: Secondary | ICD-10-CM | POA: Insufficient documentation

## 2013-07-05 DIAGNOSIS — I509 Heart failure, unspecified: Secondary | ICD-10-CM | POA: Insufficient documentation

## 2013-07-05 DIAGNOSIS — J449 Chronic obstructive pulmonary disease, unspecified: Secondary | ICD-10-CM | POA: Insufficient documentation

## 2013-07-05 DIAGNOSIS — Z79899 Other long term (current) drug therapy: Secondary | ICD-10-CM | POA: Insufficient documentation

## 2013-07-05 DIAGNOSIS — J4489 Other specified chronic obstructive pulmonary disease: Secondary | ICD-10-CM | POA: Insufficient documentation

## 2013-07-05 DIAGNOSIS — Z9889 Other specified postprocedural states: Secondary | ICD-10-CM | POA: Insufficient documentation

## 2013-07-05 DIAGNOSIS — Z91040 Latex allergy status: Secondary | ICD-10-CM | POA: Insufficient documentation

## 2013-07-05 LAB — CBG MONITORING, ED
GLUCOSE-CAPILLARY: 399 mg/dL — AB (ref 70–99)
GLUCOSE-CAPILLARY: 351 mg/dL — AB (ref 70–99)

## 2013-07-05 MED ORDER — LISINOPRIL 10 MG PO TABS: 10.0000 mg | ORAL_TABLET | Freq: Every day | ORAL | Status: DC

## 2013-07-05 MED ORDER — INSULIN ASPART 100 UNIT/ML ~~LOC~~ SOLN
10.0000 [IU] | Freq: Once | SUBCUTANEOUS | Status: AC
  Administered 2013-07-05: 10 [IU] via SUBCUTANEOUS
  Filled 2013-07-05: qty 1

## 2013-07-05 MED ORDER — METFORMIN HCL 500 MG PO TABS: 500.0000 mg | ORAL_TABLET | Freq: Two times a day (BID) | ORAL | Status: DC

## 2013-07-05 MED ORDER — INSULIN GLARGINE 100 UNIT/ML ~~LOC~~ SOLN: 24.0000 [IU] | Freq: Every day | SUBCUTANEOUS | Status: DC

## 2013-07-05 MED ORDER — HYDROCODONE-ACETAMINOPHEN 5-325 MG PO TABS
1.0000 | ORAL_TABLET | Freq: Once | ORAL | Status: AC
  Administered 2013-07-05: 1 via ORAL
  Filled 2013-07-05: qty 1

## 2013-07-05 NOTE — ED Notes (Signed)
Pt c/o headache and intermittent dizziness for past couple of days.  Says thinks bp or sugar may be up.

## 2013-07-05 NOTE — Care Management Note (Signed)
  CARE MANAGEMENT ED NOTE 07/05/2013  Patient:  Felicia [name], Felicia [name]   Account Number:  [id number]  Date Initiated:  07/05/2013  Documentation initiated by:  Anibal [facility]  Subjective/Objective Assessment:   Pt is [name] diabetic and has HBP. She lost her Medicaid when her oldest child aged out of eligilibility and joined the [employer]. She has been without medication for about [name] year.     Subjective/Objective Assessment Detail:     Action/Plan:   Pt given the [facility] handout of resources and information. She chooses to go to the [facility] for care. Appointment made at [facility] for Tues 6/23 at 10am. Also pt was given [name] voucher for medications to assist until she can be seen   at [facility] and get medication assistance started there. Pt was very appreciative of this assistance.

## 2013-07-05 NOTE — ED Notes (Signed)
Per DR. Zammit ekg not needed if pt isnt having cp. Felicia [name]

## 2013-07-05 NOTE — ED Provider Notes (Signed)
CSN: [id number]     Arrival date & time 07/05/13  0355 History   This chart was scribed for Felicia Lennert, MD by Milly Jakob, ED Scribe. The patient was seen in room APA10/APA10. Patient's care was started at 7:58 AM.    Chief Complaint  Patient presents with  . Headache   Patient is a 53 y.o. female presenting with headaches. The history is provided by the patient. No language interpreter was used.  Headache Pain location:  Generalized Quality:  Dull Radiates to:  Does not radiate Duration:  2 days Timing:  Constant Chronicity:  New Associated symptoms: no abdominal pain, no back pain, no congestion, no cough, no diarrhea, no fatigue, no seizures and no sinus pressure    HPI Comments: Felicia [name] is a 53 y.o. female with history of DM and HTN, who presents to the Emergency Department complaining of a dull, constant, generalized headache onset two days ago. She states that she is not taking blood pressure or diabetes medication because she ran out. Patient thinks that her sugar and blood pressure are contributing to her headache. Patient denies any other symptoms at this time. Patient also has a history of heart disease and COPD.  PCP - Isabella Stalling, MD    Past Medical History  Diagnosis Date  . CHF (congestive heart failure)     minamal  . Cardiomyopathy   . Hypertension   . Diabetes mellitus   . Normal echocardiogram     LVEF 25-30% 03/27/2010  . COPD (chronic obstructive pulmonary disease)   . Non compliance w medication regimen    Past Surgical History  Procedure Laterality Date  . Cardiac catheterization    . Tubal ligation     Family History  Problem Relation Age of Onset  . Stroke Mother   . Lung cancer Father    History  Substance Use Topics  . Smoking status: Current Every Day Smoker -- 1.50 packs/day    Types: Cigarettes  . Smokeless tobacco: Not on file  . Alcohol Use: No   OB History   Grav Para Term Preterm Abortions TAB SAB Ect Mult  Living                 Review of Systems  Constitutional: Negative for appetite change and fatigue.  HENT: Negative for congestion, ear discharge and sinus pressure.   Eyes: Negative for discharge.  Respiratory: Negative for cough.   Cardiovascular: Negative for chest pain.  Gastrointestinal: Negative for abdominal pain and diarrhea.  Genitourinary: Negative for frequency and hematuria.  Musculoskeletal: Negative for back pain.  Skin: Negative for rash.  Neurological: Positive for headaches. Negative for seizures.  Psychiatric/Behavioral: Negative for hallucinations.      Allergies  Latex  Home Medications   Prior to Admission medications   Medication Sig Start Date End Date Taking? Authorizing Provider  guaiFENesin-codeine (ROBITUSSIN AC) 100-10 MG/5ML syrup Take 10 mLs by mouth 3 (three) times daily as needed for cough. 04/02/13   Tammy L. Triplett, PA-C  ibuprofen (ADVIL,MOTRIN) 200 MG tablet Take 200 mg by mouth every 8 (eight) hours as needed. pain    Historical Provider, MD  insulin glargine (LANTUS) 100 UNIT/ML injection Inject 24 Units into the skin at bedtime.     Historical Provider, MD  lisinopril (PRINIVIL,ZESTRIL) 10 MG tablet Take 1 tablet (10 mg total) by mouth daily. 01/21/12   Jodelle Gross, NP  metFORMIN (GLUCOPHAGE) 500 MG tablet Take 1 tablet (500 mg total) by  mouth 2 (two) times daily with a meal. 10/24/10   Burgess AmorJulie Idol, PA-C  metFORMIN (GLUCOPHAGE) 500 MG tablet Take 1 tablet (500 mg total) by mouth 2 (two) times daily with a meal. 04/02/13   Tammy L. Triplett, PA-C  potassium chloride (K-DUR) 10 MEQ tablet Take 10 mEq by mouth daily.    Historical Provider, MD   Triage Vitals - BP 157/102  Pulse 100  Temp(Src) 98.3 F (36.8 C) (Oral)  Resp 20  Ht 4\' 11"  (1.499 m)  Wt 144 lb (65.318 kg)  BMI 29.07 kg/m2  SpO2 94%  LMP 08/02/2010 Physical Exam  Constitutional: She is oriented to person, place, and time. She appears well-developed.  HENT:  Head:  Normocephalic.  Eyes: Conjunctivae and EOM are normal. No scleral icterus.  Neck: Neck supple. No thyromegaly present.  Cardiovascular: Normal rate and regular rhythm.  Exam reveals no gallop and no friction rub.   No murmur heard. Pulmonary/Chest: No stridor. She has no wheezes. She has no rales. She exhibits no tenderness.  Abdominal: She exhibits no distension. There is no tenderness. There is no rebound.  Musculoskeletal: Normal range of motion. She exhibits no edema.  Lymphadenopathy:    She has no cervical adenopathy.  Neurological: She is oriented to person, place, and time. She exhibits normal muscle tone. Coordination normal.  Skin: No rash noted. No erythema.  Psychiatric: She has a normal mood and affect. Her behavior is normal.    ED Course  Procedures (including critical care time)  DIAGNOSTIC STUDIES: Oxygen Saturation is 94% on room air, adequate by my interpretation.    COORDINATION OF CARE: 8:01 AM-Discussed treatment plan which includes Novolog and pain medicine with pt at bedside and pt agreed to plan.   Labs Review Labs Reviewed  CBG MONITORING, ED - Abnormal; Notable for the following:    Glucose-Capillary 399 (*)    All other components within normal limits     MDM   Final diagnoses:  None    The chart was scribed for me under my direct supervision.  I personally performed the history, physical, and medical decision making and all procedures in the evaluation of this patient.Felicia [name].   Sandeep Delagarza L Nussen Pullin, MD 07/05/13 [phone]

## 2013-07-05 NOTE — ED Notes (Signed)
Pt states she has been out of her medications for about a year. States she does not have insurance. Has not attempted to get into the [facility]

## 2013-07-05 NOTE — Discharge Instructions (Signed)
Follow up with the health department next tuesday

## 2013-10-02 ENCOUNTER — Encounter (HOSPITAL_COMMUNITY): Payer: Self-pay | Admitting: Emergency Medicine

## 2013-10-02 ENCOUNTER — Emergency Department (HOSPITAL_COMMUNITY)
Admission: EM | Admit: 2013-10-02 | Discharge: 2013-10-02 | Disposition: A | Discharge status: Home / Self Care | Payer: No Typology Code available for payment source | Attending: Emergency Medicine | Admitting: Emergency Medicine

## 2013-10-02 DIAGNOSIS — J4489 Other specified chronic obstructive pulmonary disease: Secondary | ICD-10-CM | POA: Insufficient documentation

## 2013-10-02 DIAGNOSIS — Z91040 Latex allergy status: Secondary | ICD-10-CM | POA: Insufficient documentation

## 2013-10-02 DIAGNOSIS — I509 Heart failure, unspecified: Secondary | ICD-10-CM | POA: Insufficient documentation

## 2013-10-02 DIAGNOSIS — Z91199 Patient's noncompliance with other medical treatment and regimen due to unspecified reason: Secondary | ICD-10-CM | POA: Insufficient documentation

## 2013-10-02 DIAGNOSIS — T383X5A Adverse effect of insulin and oral hypoglycemic [antidiabetic] drugs, initial encounter: Secondary | ICD-10-CM | POA: Insufficient documentation

## 2013-10-02 DIAGNOSIS — Z794 Long term (current) use of insulin: Secondary | ICD-10-CM | POA: Insufficient documentation

## 2013-10-02 DIAGNOSIS — Z9119 Patient's noncompliance with other medical treatment and regimen: Secondary | ICD-10-CM | POA: Insufficient documentation

## 2013-10-02 DIAGNOSIS — Z79899 Other long term (current) drug therapy: Secondary | ICD-10-CM | POA: Insufficient documentation

## 2013-10-02 DIAGNOSIS — T50905A Adverse effect of unspecified drugs, medicaments and biological substances, initial encounter: Secondary | ICD-10-CM

## 2013-10-02 DIAGNOSIS — F17200 Nicotine dependence, unspecified, uncomplicated: Secondary | ICD-10-CM | POA: Insufficient documentation

## 2013-10-02 DIAGNOSIS — I10 Essential (primary) hypertension: Secondary | ICD-10-CM | POA: Insufficient documentation

## 2013-10-02 DIAGNOSIS — R21 Rash and other nonspecific skin eruption: Secondary | ICD-10-CM | POA: Insufficient documentation

## 2013-10-02 DIAGNOSIS — L299 Pruritus, unspecified: Secondary | ICD-10-CM | POA: Insufficient documentation

## 2013-10-02 DIAGNOSIS — E119 Type 2 diabetes mellitus without complications: Secondary | ICD-10-CM | POA: Insufficient documentation

## 2013-10-02 DIAGNOSIS — J449 Chronic obstructive pulmonary disease, unspecified: Secondary | ICD-10-CM | POA: Insufficient documentation

## 2013-10-02 DIAGNOSIS — Z9889 Other specified postprocedural states: Secondary | ICD-10-CM | POA: Insufficient documentation

## 2013-10-02 MED ORDER — INSULIN GLARGINE 100 UNIT/ML ~~LOC~~ SOLN
70.0000 [IU] | Freq: Once | SUBCUTANEOUS | Status: AC
  Administered 2013-10-02: 70 [IU] via SUBCUTANEOUS
  Filled 2013-10-02: qty 0.7

## 2013-10-02 MED ORDER — DIPHENHYDRAMINE HCL 25 MG PO TABS: 25.0000 mg | ORAL_TABLET | ORAL | Status: DC | PRN

## 2013-10-02 NOTE — ED Notes (Signed)
Pt reports allergic reaction to insulin. Pt reports recently switched insulin from lantus to levemir. Over injection sites redness noted. Pt denies any abdominal pain,n/v/d. nad noted. Pt reports itching to injection sites.

## 2013-10-02 NOTE — ED Provider Notes (Signed)
CSN: [id number]     Arrival date & time 10/02/13  1744 History   First MD Initiated Contact with Patient 10/02/13 1759     Chief Complaint  Patient presents with  . Allergic Reaction     (Consider location/radiation/quality/duration/timing/severity/associated sxs/prior Treatment) HPI   Felicia [name] is a 53 y.o. female who presents to the Emergency Department complaining of redness, itching to her abdomen at two sites of recent insulin injection.  She states the her PMD at the local health dept recently switched her from lantus to levemir insulin.  She noticed redness and itching to the site and had a similar reaction on her abdomen after the subsequent injection.  She also reports mild tenderness.  She denies rash, chest pain, shortness of breath, facial swelling or difficulty swallowing or breathing.  She also states that the health dept gives her her insulin at a discounted cost and that she doesn't have insurance and cannot afford it otherwise.     Past Medical History  Diagnosis Date  . CHF (congestive heart failure)     minamal  . Cardiomyopathy   . Hypertension   . Diabetes mellitus   . Normal echocardiogram     LVEF 25-30% 03/27/2010  . COPD (chronic obstructive pulmonary disease)   . Non compliance w medication regimen    Past Surgical History  Procedure Laterality Date  . Cardiac catheterization    . Tubal ligation     Family History  Problem Relation Age of Onset  . Stroke Mother   . Lung cancer Father    History  Substance Use Topics  . Smoking status: Current Every Day Smoker -- 0.50 packs/day    Types: Cigarettes  . Smokeless tobacco: Not on file  . Alcohol Use: No   OB History   Grav Para Term Preterm Abortions TAB SAB Ect Mult Living                 Review of Systems  Constitutional: Negative for fever, chills, activity change and appetite change.  HENT: Negative for facial swelling, sore throat and trouble swallowing.   Respiratory: Negative  for chest tightness, shortness of breath and wheezing.   Cardiovascular: Negative for chest pain.  Gastrointestinal: Negative for nausea and vomiting.  Musculoskeletal: Negative for arthralgias, myalgias, neck pain and neck stiffness.  Skin: Positive for rash. Negative for wound.  Neurological: Negative for dizziness, weakness, numbness and headaches.  All other systems reviewed and are negative.     Allergies  Latex  Home Medications   Prior to Admission medications   Medication Sig Start Date End Date Taking? Authorizing Provider  acetaminophen (TYLENOL) 500 MG tablet Take 1,500 mg by mouth daily as needed for mild pain or headache.   Yes Historical Provider, MD  ibuprofen (ADVIL,MOTRIN) 200 MG tablet Take 1,000 mg by mouth daily as needed for moderate pain. pain   Yes Historical Provider, MD  insulin detemir (LEVEMIR) 100 UNIT/ML injection Inject 35 Units into the skin at bedtime.   Yes Historical Provider, MD  lisinopril (PRINIVIL) 10 MG tablet Take 1 tablet (10 mg total) by mouth daily. 07/05/13  Yes Benny Lennert, MD  metFORMIN (GLUCOPHAGE) 500 MG tablet Take 500-1,000 mg by mouth 2 (two) times daily with a meal. Patient takes 2 tablets in the morning and 1 tablet at night   Yes Historical Provider, MD  Multiple Vitamin (MULTIVITAMIN WITH MINERALS) TABS tablet Take 1 tablet by mouth daily.   Yes Historical Provider, MD  Multiple Vitamins-Minerals (HAIR/SKIN/NAILS/BIOTIN) TABS Take 5 tablets by mouth daily.   Yes Historical Provider, MD   BP 179/98  Pulse 81  Temp(Src) 98.9 F (37.2 C) (Oral)  Resp 16  Ht 4\' 11"  (1.499 m)  Wt 144 lb (65.318 kg)  BMI 29.07 kg/m2  SpO2 96%  LMP 08/02/2010 Physical Exam  Nursing note and vitals reviewed. Constitutional: She is oriented to person, place, and time. She appears well-developed and well-nourished. No distress.  HENT:  Head: Normocephalic and atraumatic.  Mouth/Throat: Oropharynx is clear and moist.  Neck: Normal range of  motion. Neck supple.  Cardiovascular: Normal rate, regular rhythm, normal heart sounds and intact distal pulses.   No murmur heard. Pulmonary/Chest: Effort normal and breath sounds normal. No respiratory distress. She has no wheezes. She has no rales.  Musculoskeletal: She exhibits no edema and no tenderness.  Lymphadenopathy:    She has no cervical adenopathy.  Neurological: She is alert and oriented to person, place, and time. She exhibits normal muscle tone. Coordination normal.  Skin: Skin is warm. Rash noted. There is erythema.  Two 10 cm localized areas of erythema to the mid abdomen at reported sites of previous insulin injection.  Mildly indurated.  No fluctuance or lymphangitis.  No other rash    ED Course  Procedures (including critical care time) Labs Review Labs Reviewed - No data to display  Imaging Review No results found.   EKG Interpretation None      MDM   Final diagnoses:  Drug reaction, initial encounter    Patient is well appearing, non-toxic.  VSS.  Localized reactions to mid abdomen at sites of previous insulin injections.  No concerning sx's for abscess.  Pt also seen by Dr. Estell Harpin and care plan discussed.  likely rxn to levemir and pt advised to go back to taking lantus until see can see her PMD.  Pt cannot afford prescription Lantus and does not have insurance.  I will dispense enough Lantus for tonight and tomorrow and she agrees to follow-up with the health dept on Monday morning.  VSS.  Pt is well appearing.  Also agrees to benadryl and warm compresses.  Advised to return here for any worsening symptoms, pt agrees to plan.     Janine Reller L. Trisha Mangle, PA-C 10/04/13 1553

## 2013-10-05 NOTE — ED Provider Notes (Signed)
Medical screening examination/treatment/procedure(s) were performed by non-physician practitioner and as supervising physician I was immediately available for consultation/collaboration.   EKG Interpretation None        Benny Lennert, MD 10/05/13 1355

## 2013-11-05 ENCOUNTER — Emergency Department (HOSPITAL_COMMUNITY)
Admission: EM | Admit: 2013-11-05 | Discharge: 2013-11-05 | Disposition: A | Discharge status: Home / Self Care | Payer: No Typology Code available for payment source | Attending: Emergency Medicine | Admitting: Emergency Medicine

## 2013-11-05 ENCOUNTER — Encounter (HOSPITAL_COMMUNITY): Payer: Self-pay | Admitting: Emergency Medicine

## 2013-11-05 DIAGNOSIS — Z91040 Latex allergy status: Secondary | ICD-10-CM | POA: Insufficient documentation

## 2013-11-05 DIAGNOSIS — Z79899 Other long term (current) drug therapy: Secondary | ICD-10-CM | POA: Insufficient documentation

## 2013-11-05 DIAGNOSIS — I509 Heart failure, unspecified: Secondary | ICD-10-CM | POA: Insufficient documentation

## 2013-11-05 DIAGNOSIS — E119 Type 2 diabetes mellitus without complications: Secondary | ICD-10-CM | POA: Insufficient documentation

## 2013-11-05 DIAGNOSIS — G44209 Tension-type headache, unspecified, not intractable: Secondary | ICD-10-CM

## 2013-11-05 DIAGNOSIS — J449 Chronic obstructive pulmonary disease, unspecified: Secondary | ICD-10-CM | POA: Insufficient documentation

## 2013-11-05 DIAGNOSIS — Z720 Tobacco use: Secondary | ICD-10-CM | POA: Insufficient documentation

## 2013-11-05 DIAGNOSIS — Z794 Long term (current) use of insulin: Secondary | ICD-10-CM | POA: Insufficient documentation

## 2013-11-05 MED ORDER — CYCLOBENZAPRINE HCL 10 MG PO TABS: 10.0000 mg | ORAL_TABLET | Freq: Three times a day (TID) | ORAL | Status: DC | PRN

## 2013-11-05 MED ORDER — CYCLOBENZAPRINE HCL 10 MG PO TABS
5.0000 mg | ORAL_TABLET | Freq: Once | ORAL | Status: AC
  Administered 2013-11-05: 5 mg via ORAL
  Filled 2013-11-05: qty 1

## 2013-11-05 MED ORDER — HYDROCODONE-ACETAMINOPHEN 5-325 MG PO TABS
1.0000 | ORAL_TABLET | Freq: Once | ORAL | Status: AC
  Administered 2013-11-05: 1 via ORAL
  Filled 2013-11-05: qty 1

## 2013-11-05 NOTE — ED Notes (Signed)
Pt c/o elevated bp x 2-3 days. Pt states she has had intermittent ha's and hot flashes. Pt takes lisinopril 10mg  daily, has not taken today's dose.

## 2013-11-05 NOTE — Discharge Instructions (Signed)
Her blood pressure here is well controlled. Do not skip your morning blood pressure medicines. Flexeril as needed for muscle tension headache.  Tension Headache A tension headache is pain, pressure, or aching felt over the front and sides of the head. Tension headaches often come after stress, feeling worried (anxiety), or feeling sad or down for a while (depressed). HOME CARE  Only take medicine as told by your doctor.  Lie down in a dark, quiet room when you have a headache.  Keep a journal to find out if certain things bring on headaches. For example, write down:  What you eat and drink.  How much sleep you get.  Any change to your diet or medicines.  Relax by getting a massage or doing other relaxing activities.  Put ice or heat packs on the head and neck area as told by your doctor.  Lessen stress.  Sit up straight. Do not tighten (tense) your muscles.  Quit smoking if you smoke.  Lessen how much alcohol you drink.  Lessen how much caffeine you drink, or stop drinking caffeine.  Eat and exercise regularly.  Get enough sleep.  Avoid using too much pain medicine. GET HELP RIGHT AWAY IF:   Your headache becomes really bad.  You have a fever.  You have a stiff neck.  You have trouble seeing.  Your muscles are weak, or you lose muscle control.  You lose your balance or have trouble walking.  You feel like you will pass out (faint), or you pass out.  You have really bad symptoms that are different than your first symptoms.  You have problems with the medicines given to you by your doctor.  Your medicines do not work.  Your headache feels different than the other headaches.  You feel sick to your stomach (nauseous) or throw up (vomit). MAKE SURE YOU:   Understand these instructions.  Will watch your condition.  Will get help right away if you are not doing well or get worse. Document Released: 04/03/2009 Document Revised: 04/01/2011 Document  Reviewed: 12/28/2010 [facility] Patient Information 2015 [place], [place]. This information is not intended to replace advice given to you by your health care provider. Make sure you discuss any questions you have with your health care provider.

## 2013-11-05 NOTE — ED Provider Notes (Signed)
CSN: [id number]     Arrival date & time 11/05/13  [id number] History  This chart was scribed for Rolland PorterMark Tenelle Andreason, MD by Chestine SporeSoijett Blue, ED Scribe. The patient was seen in room APA03/APA03 at 10:02 AM.     Chief Complaint  Patient presents with  . Hypertension     The history is provided by the patient. No language interpreter was used.  Felicia [name] is a 53 y.o. female who presents today to the Emergency Department complaining of hypertension onset yesterday. She states that she took her blood pressure yesterday and it was 148/118. She states that she took it today and it was 148/100. She states that she takes her BP medicine every morning from 8-9 AM. She states that she takes Metformin. She states that she has not taken her meds today because she has not ate. She states that gets HA when she gets a high blood pressure. She states that she is having associated symptoms of HA. She rates her HA as a 8.5/10 and it is across her forehead. She states that her HA is throbbing. She states that she has tried Tylenol with no relief for her symptoms of HA. She denies SOB, chest pain, visual disturbance, swelling in the extremities, and any other associated symptoms.  PCP-MUSE,ROCHELLE D., PA-C  Pt blood pressure is 148/96 while in the ED on the left arm      Past Medical History  Diagnosis Date  . CHF (congestive heart failure)     minamal  . Cardiomyopathy   . Hypertension   . Diabetes mellitus   . Normal echocardiogram     LVEF 25-30% 03/27/2010  . COPD (chronic obstructive pulmonary disease)   . Non compliance w medication regimen    Past Surgical History  Procedure Laterality Date  . Cardiac catheterization    . Tubal ligation     Family History  Problem Relation Age of Onset  . Stroke Mother   . Lung cancer Father    History  Substance Use Topics  . Smoking status: Current Every Day Smoker -- 0.50 packs/day    Types: Cigarettes  . Smokeless tobacco: Not on file  . Alcohol Use: No    OB History   Grav Para Term Preterm Abortions TAB SAB Ect Mult Living                 Review of Systems  Constitutional: Negative for fever, chills, diaphoresis, appetite change and fatigue.       Hypertension  HENT: Negative for mouth sores, sore throat and trouble swallowing.   Eyes: Negative for visual disturbance.  Respiratory: Negative for cough, chest tightness, shortness of breath and wheezing.   Cardiovascular: Negative for chest pain.  Gastrointestinal: Negative for nausea, vomiting, abdominal pain, diarrhea and abdominal distention.  Endocrine: Negative for polydipsia, polyphagia and polyuria.  Genitourinary: Negative for dysuria, frequency and hematuria.  Musculoskeletal: Negative for gait problem.  Skin: Negative for color change, pallor and rash.  Neurological: Negative for dizziness, syncope, light-headedness and headaches.  Hematological: Does not bruise/bleed easily.  Psychiatric/Behavioral: Negative for behavioral problems and confusion.      Allergies  Latex  Home Medications   Prior to Admission medications   Medication Sig Start Date End Date Taking? Authorizing Provider  acetaminophen (TYLENOL) 500 MG tablet Take 1,500 mg by mouth daily as needed for mild pain or headache.    Historical Provider, MD  cyclobenzaprine (FLEXERIL) 10 MG tablet Take 1 tablet (10 mg total) by mouth  3 (three) times daily as needed for muscle spasms. 11/05/13   Rolland Porter, MD  diphenhydrAMINE (BENADRYL) 25 MG tablet Take 1 tablet (25 mg total) by mouth every 4 (four) hours as needed. 10/02/13   Tammy L. Triplett, PA-C  ibuprofen (ADVIL,MOTRIN) 200 MG tablet Take 1,000 mg by mouth daily as needed for moderate pain. pain    Historical Provider, MD  insulin detemir (LEVEMIR) 100 UNIT/ML injection Inject 35 Units into the skin at bedtime.    Historical Provider, MD  lisinopril (PRINIVIL) 10 MG tablet Take 1 tablet (10 mg total) by mouth daily. 07/05/13   Benny Lennert, MD  metFORMIN  (GLUCOPHAGE) 500 MG tablet Take 500-1,000 mg by mouth 2 (two) times daily with a meal. Patient takes 2 tablets in the morning and 1 tablet at night    Historical Provider, MD  Multiple Vitamin (MULTIVITAMIN WITH MINERALS) TABS tablet Take 1 tablet by mouth daily.    Historical Provider, MD  Multiple Vitamins-Minerals (HAIR/SKIN/NAILS/BIOTIN) TABS Take 5 tablets by mouth daily.    Historical Provider, MD   BP 148/101  Pulse 95  Temp(Src) 98.4 F (36.9 C)  Resp 18  Ht 4\' 11"  (1.499 m)  Wt 148 lb (67.132 kg)  BMI 29.88 kg/m2  SpO2 98%  LMP 08/02/2010  Physical Exam  Nursing note and vitals reviewed. Constitutional: She is oriented to person, place, and time. She appears well-developed and well-nourished. No distress.  HENT:  Head: Normocephalic.  Eyes: Conjunctivae are normal. Pupils are equal, round, and reactive to light. No scleral icterus.  Neck: Normal range of motion. Neck supple. No thyromegaly present.  Cardiovascular: Normal rate and regular rhythm.  Exam reveals no gallop and no friction rub.   No murmur heard. Pulmonary/Chest: Effort normal and breath sounds normal. No respiratory distress. She has no wheezes. She has no rales.  Abdominal: Soft. Bowel sounds are normal. She exhibits no distension. There is no tenderness. There is no rebound.  Musculoskeletal: Normal range of motion.  Neurological: She is alert and oriented to person, place, and time.  Skin: Skin is warm and dry. No rash noted.  Psychiatric: She has a normal mood and affect. Her behavior is normal.    ED Course  Procedures (including critical care time) DIAGNOSTIC STUDIES: Oxygen Saturation is 98% on room air, normal by my interpretation.    COORDINATION OF CARE: 10:06 AM-Discussed treatment plan which includes medications for headache with pt at bedside and pt agreed to plan.   Labs Review Labs Reviewed - No data to display  Imaging Review No results found.   EKG Interpretation None      MDM    Final diagnoses:  Tension-type headache, not intractable, unspecified chronicity pattern    Blood pressures here under good control. Tension headache. Doubt subarachnoid hemorrhage or hypertensive urgency with blood pressure 136/85. Plan is discharge home. Flexeril for muscle tension headache.  I personally performed the services described in this documentation, which was scribed in my presence. The recorded information has been reviewed and is accurate.    Rolland Porter, MD 11/05/13 [id number]

## 2014-01-07 ENCOUNTER — Emergency Department (HOSPITAL_COMMUNITY)
Admission: EM | Admit: 2014-01-07 | Discharge: 2014-01-07 | Disposition: A | Discharge status: Home / Self Care | Payer: No Typology Code available for payment source | Attending: Emergency Medicine | Admitting: Emergency Medicine

## 2014-01-07 ENCOUNTER — Encounter (HOSPITAL_COMMUNITY): Payer: Self-pay | Admitting: Cardiology

## 2014-01-07 DIAGNOSIS — E119 Type 2 diabetes mellitus without complications: Secondary | ICD-10-CM | POA: Insufficient documentation

## 2014-01-07 DIAGNOSIS — Z794 Long term (current) use of insulin: Secondary | ICD-10-CM | POA: Insufficient documentation

## 2014-01-07 DIAGNOSIS — A5901 Trichomonal vulvovaginitis: Secondary | ICD-10-CM | POA: Insufficient documentation

## 2014-01-07 DIAGNOSIS — I509 Heart failure, unspecified: Secondary | ICD-10-CM | POA: Insufficient documentation

## 2014-01-07 DIAGNOSIS — Z9851 Tubal ligation status: Secondary | ICD-10-CM | POA: Insufficient documentation

## 2014-01-07 DIAGNOSIS — J449 Chronic obstructive pulmonary disease, unspecified: Secondary | ICD-10-CM | POA: Insufficient documentation

## 2014-01-07 DIAGNOSIS — Z720 Tobacco use: Secondary | ICD-10-CM | POA: Insufficient documentation

## 2014-01-07 DIAGNOSIS — I10 Essential (primary) hypertension: Secondary | ICD-10-CM | POA: Insufficient documentation

## 2014-01-07 DIAGNOSIS — Z9114 Patient's other noncompliance with medication regimen: Secondary | ICD-10-CM | POA: Insufficient documentation

## 2014-01-07 DIAGNOSIS — Z79899 Other long term (current) drug therapy: Secondary | ICD-10-CM | POA: Insufficient documentation

## 2014-01-07 DIAGNOSIS — Z8739 Personal history of other diseases of the musculoskeletal system and connective tissue: Secondary | ICD-10-CM | POA: Insufficient documentation

## 2014-01-07 DIAGNOSIS — A599 Trichomoniasis, unspecified: Secondary | ICD-10-CM

## 2014-01-07 HISTORY — DX: Other intervertebral disc degeneration, lumbar region: M51.36

## 2014-01-07 HISTORY — DX: Other intervertebral disc degeneration, lumbar region without mention of lumbar back pain or lower extremity pain: M51.369

## 2014-01-07 LAB — URINE MICROSCOPIC-ADD ON

## 2014-01-07 LAB — COMPREHENSIVE METABOLIC PANEL
ALK PHOS: 90 U/L (ref 39–117)
ALT: 12 U/L (ref 0–35)
AST: 12 U/L (ref 0–37)
Albumin: 3.5 g/dL (ref 3.5–5.2)
Anion gap: 12 (ref 5–15)
BUN: 14 mg/dL (ref 6–23)
CALCIUM: 9.7 mg/dL (ref 8.4–10.5)
CO2: 27 mEq/L (ref 19–32)
Chloride: 103 mEq/L (ref 96–112)
Creatinine, Ser: 0.75 mg/dL (ref 0.50–1.10)
GFR calc non Af Amer: >90 mL/min (ref >90)
Glucose, Bld: 94 mg/dL (ref 70–99)
POTASSIUM: 3.8 meq/L (ref 3.7–5.3)
Sodium: 142 mEq/L (ref 137–147)
Total Protein: 6.6 g/dL (ref 6.0–8.3)

## 2014-01-07 LAB — CBC WITH DIFFERENTIAL/PLATELET
Basophils Absolute: 0 10*3/uL (ref 0.0–0.1)
Basophils Relative: 0 % (ref 0–1)
EOS PCT: 2 % (ref 0–5)
Eosinophils Absolute: 0.2 10*3/uL (ref 0.0–0.7)
HCT: 40.9 % (ref 36.0–46.0)
Hemoglobin: 13.7 g/dL (ref 12.0–15.0)
Lymphocytes Relative: 46 % (ref 12–46)
Lymphs Abs: 5.1 10*3/uL — ABNORMAL HIGH (ref 0.7–4.0)
MCH: 31.5 pg (ref 26.0–34.0)
MCHC: 33.5 g/dL (ref 30.0–36.0)
MCV: 94 fL (ref 78.0–100.0)
Monocytes Absolute: 0.6 10*3/uL (ref 0.1–1.0)
Monocytes Relative: 5 % (ref 3–12)
Neutro Abs: 5.1 10*3/uL (ref 1.7–7.7)
Neutrophils Relative %: 47 % (ref 43–77)
Platelets: 233 10*3/uL (ref 150–400)
RBC: 4.35 MIL/uL (ref 3.87–5.11)
RDW: 14.9 % (ref 11.5–15.5)
WBC: 11 10*3/uL — ABNORMAL HIGH (ref 4.0–10.5)

## 2014-01-07 LAB — WET PREP, GENITAL
Clue Cells Wet Prep HPF POC: NONE SEEN
Yeast Wet Prep HPF POC: NONE SEEN

## 2014-01-07 LAB — URINALYSIS, ROUTINE W REFLEX MICROSCOPIC
BILIRUBIN URINE: NEGATIVE
Glucose, UA: NEGATIVE mg/dL
Hgb urine dipstick: NEGATIVE
NITRITE: NEGATIVE
PH: 5.5 (ref 5.0–8.0)
Protein, ur: NEGATIVE mg/dL
Specific Gravity, Urine: >1.03 — ABNORMAL HIGH (ref 1.005–1.030)
UROBILINOGEN UA: 1 mg/dL (ref 0.0–1.0)

## 2014-01-07 MED ORDER — KETOROLAC TROMETHAMINE 60 MG/2ML IM SOLN
60.0000 mg | Freq: Once | INTRAMUSCULAR | Status: AC
  Administered 2014-01-07: 60 mg via INTRAMUSCULAR
  Filled 2014-01-07: qty 2

## 2014-01-07 MED ORDER — AZITHROMYCIN 250 MG PO TABS
1000.0000 mg | ORAL_TABLET | Freq: Once | ORAL | Status: AC
  Administered 2014-01-07: 1000 mg via ORAL
  Filled 2014-01-07: qty 4

## 2014-01-07 MED ORDER — KETOROLAC TROMETHAMINE 30 MG/ML IJ SOLN
60.0000 mg | Freq: Once | INTRAMUSCULAR | Status: DC
  Filled 2014-01-07: qty 2

## 2014-01-07 MED ORDER — TRAMADOL HCL 50 MG PO TABS: 50.0000 mg | ORAL_TABLET | Freq: Four times a day (QID) | ORAL | Status: DC | PRN

## 2014-01-07 MED ORDER — CEFTRIAXONE SODIUM 250 MG IJ SOLR
250.0000 mg | Freq: Once | INTRAMUSCULAR | Status: AC
  Administered 2014-01-07: 250 mg via INTRAMUSCULAR
  Filled 2014-01-07: qty 250

## 2014-01-07 MED ORDER — LIDOCAINE HCL (PF) 2 % IJ SOLN
INTRAMUSCULAR | Status: AC
  Administered 2014-01-07: 2.1 mL
  Filled 2014-01-07: qty 10

## 2014-01-07 MED ORDER — IBUPROFEN 800 MG PO TABS: 800.0000 mg | ORAL_TABLET | Freq: Three times a day (TID) | ORAL | Status: DC | PRN

## 2014-01-07 MED ORDER — METRONIDAZOLE 500 MG PO TABS
2000.0000 mg | ORAL_TABLET | Freq: Once | ORAL | Status: AC
  Administered 2014-01-07: 2000 mg via ORAL
  Filled 2014-01-07: qty 4

## 2014-01-07 NOTE — Discharge Instructions (Signed)
Sexually Transmitted Disease °A sexually transmitted disease (STD) is a disease or infection that may be passed (transmitted) from person to person, usually during sexual activity. This may happen by way of saliva, semen, blood, vaginal mucus, or urine. Common STDs include:  °· Gonorrhea.   °· Chlamydia.   °· Syphilis.   °· HIV and AIDS.   °· Genital herpes.   °· Hepatitis B and C.   °· Trichomonas.   °· Human papillomavirus (HPV).   °· Pubic lice.   °· Scabies. °· Mites. °· Bacterial vaginosis. °WHAT ARE CAUSES OF STDs? °An STD may be caused by bacteria, a virus, or parasites. STDs are often transmitted during sexual activity if one person is infected. However, they may also be transmitted through nonsexual means. STDs may be transmitted after:  °· Sexual intercourse with an infected person.   °· Sharing sex toys with an infected person.   °· Sharing needles with an infected person or using unclean piercing or tattoo needles. °· Having intimate contact with the genitals, mouth, or rectal areas of an infected person.   °· Exposure to infected fluids during birth. °WHAT ARE THE SIGNS AND SYMPTOMS OF STDs? °Different STDs have different symptoms. Some people may not have any symptoms. If symptoms are present, they may include:  °· Painful or bloody urination.   °· Pain in the pelvis, abdomen, vagina, anus, throat, or eyes.   °· A skin rash, itching, or irritation. °· Growths, ulcerations, blisters, or sores in the genital and anal areas. °· Abnormal vaginal discharge with or without bad odor.   °· Penile discharge in men.   °· Fever.   °· Pain or bleeding during sexual intercourse.   °· Swollen glands in the groin area.   °· Yellow skin and eyes (jaundice). This is seen with hepatitis.   °· Swollen testicles. °· Infertility. °· Sores and blisters in the mouth. °HOW ARE STDs DIAGNOSED? °To make a diagnosis, your health care provider may:  °· Take a medical history.   °· Perform a physical exam.   °· Take a sample of  any discharge to examine. °· Swab the throat, cervix, opening to the penis, rectum, or vagina for testing. °· Test a sample of your first morning urine.   °· Perform blood tests.   °· Perform a Pap test, if this applies.   °· Perform a colposcopy.   °· Perform a laparoscopy.   °HOW ARE STDs TREATED? ° Treatment depends on the STD. Some STDs may be treated but not cured.  °· Chlamydia, gonorrhea, trichomonas, and syphilis can be cured with antibiotic medicine.   °· Genital herpes, hepatitis, and HIV can be treated, but not cured, with prescribed medicines. The medicines lessen symptoms.   °· Genital warts from HPV can be treated with medicine or by freezing, burning (electrocautery), or surgery. Warts may come back.   °· HPV cannot be cured with medicine or surgery. However, abnormal areas may be removed from the cervix, vagina, or vulva.   °· If your diagnosis is confirmed, your recent sexual partners need treatment. This is true even if they are symptom-free or have a negative culture or evaluation. They should not have sex until their health care providers say it is okay. °HOW CAN I REDUCE MY RISK OF GETTING AN STD? °Take these steps to reduce your risk of getting an STD: °· Use latex condoms, dental dams, and water-soluble lubricants during sexual activity. Do not use petroleum jelly or oils. °· Avoid having multiple sex partners. °· Do not have sex with someone who has other sex partners. °· Do not have sex with anyone you do not know or who is at   high risk for an STD. °· Avoid risky sex practices that can break your skin. °· Do not have sex if you have open sores on your mouth or skin. °· Avoid drinking too much alcohol or taking illegal drugs. Alcohol and drugs can affect your judgment and put you in a vulnerable position. °· Avoid engaging in oral and anal sex acts. °· Get vaccinated for HPV and hepatitis. If you have not received these vaccines in the past, talk to your health care provider about whether one  or both might be right for you.   °· If you are at risk of being infected with HIV, it is recommended that you take a prescription medicine daily to prevent HIV infection. This is called pre-exposure prophylaxis (PrEP). You are considered at risk if: °· You are a man who has sex with other men (MSM). °· You are a heterosexual man or woman and are sexually active with more than one partner. °· You take drugs by injection. °· You are sexually active with a partner who has HIV. °· Talk with your health care provider about whether you are at high risk of being infected with HIV. If you choose to begin PrEP, you should first be tested for HIV. You should then be tested every 3 months for as long as you are taking PrEP.   °WHAT SHOULD I DO IF I THINK I HAVE AN STD? °· See your health care provider.   °· Tell your sexual partner(s). They should be tested and treated for any STDs. °· Do not have sex until your health care provider says it is okay.  °WHEN SHOULD I GET IMMEDIATE MEDICAL CARE? °Contact your health care provider right away if:  °· You have severe abdominal pain. °· You are a man and notice swelling or pain in your testicles. °· You are a woman and notice swelling or pain in your vagina. °Document Released: 03/30/2002 Document Revised: 01/12/2013 Document Reviewed: 07/28/2012 °ExitCare® Patient Information ©2015 [employer]. This information is not intended to replace advice given to you by your health care provider. Make sure you discuss any questions you have with your health care provider. ° °Trichomoniasis °Trichomoniasis is an infection caused by an organism called Trichomonas. The infection can affect both women and men. In women, the outer female genitalia and the vagina are affected. In men, the penis is mainly affected, but the prostate and other reproductive organs can also be involved. Trichomoniasis is a sexually transmitted infection (STI) and is most often passed to another person through sexual  contact.  °RISK FACTORS °· Having unprotected sexual intercourse. °· Having sexual intercourse with an infected partner. °SIGNS AND SYMPTOMS  °Symptoms of trichomoniasis in women include: °· Abnormal gray-green frothy vaginal discharge. °· Itching and irritation of the vagina. °· Itching and irritation of the area outside the vagina. °Symptoms of trichomoniasis in men include:  °· Penile discharge with or without pain. °· Pain during urination. This results from inflammation of the urethra. °DIAGNOSIS  °Trichomoniasis may be found during a Pap test or physical exam. Your health care provider may use one of the following methods to help diagnose this infection: °· Examining vaginal discharge under a microscope. For men, urethral discharge would be examined. °· Testing the pH of the vagina with a test tape. °· Using a vaginal swab test that checks for the Trichomonas organism. A test is available that provides results within a few minutes. °· Doing a culture test for the organism. This is not usually needed. °  TREATMENT  °· You may be given medicine to fight the infection. Women should inform their health care provider if they could be or are pregnant. Some medicines used to treat the infection should not be taken during pregnancy. °· Your health care provider may recommend over-the-counter medicines or creams to decrease itching or irritation. °· Your sexual partner will need to be treated if infected. °HOME CARE INSTRUCTIONS  °· Take medicines only as directed by your health care provider. °· Take over-the-counter medicine for itching or irritation as directed by your health care provider. °· Do not have sexual intercourse while you have the infection. °· Women should not douche or wear tampons while they have the infection. °· Discuss your infection with your partner. Your partner may have gotten the infection from you, or you may have gotten it from your partner. °· Have your sex partner get examined and treated if  necessary. °· Practice safe, informed, and protected sex. °· See your health care provider for other STI testing. °SEEK MEDICAL CARE IF:  °· You still have symptoms after you finish your medicine. °· You develop abdominal pain. °· You have pain when you urinate. °· You have bleeding after sexual intercourse. °· You develop a rash. °· Your medicine makes you sick or makes you throw up (vomit). °MAKE SURE YOU: °· Understand these instructions. °· Will watch your condition. °· Will get help right away if you are not doing well or get worse. °Document Released: 07/03/2000 Document Revised: 05/24/2013 Document Reviewed: 10/19/2012 °ExitCare® Patient Information ©2015 [employer]. This information is not intended to replace advice given to you by your health care provider. Make sure you discuss any questions you have with your health care provider. ° °

## 2014-01-07 NOTE — ED Notes (Signed)
Left flank pain times 2 days.   
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
## 2014-01-08 LAB — GC/CHLAMYDIA PROBE AMP
CT Probe RNA: NEGATIVE
GC PROBE AMP APTIMA: NEGATIVE

## 2014-01-11 ENCOUNTER — Emergency Department (HOSPITAL_COMMUNITY): Payer: No Typology Code available for payment source

## 2014-01-11 ENCOUNTER — Encounter (HOSPITAL_COMMUNITY): Payer: Self-pay

## 2014-01-11 ENCOUNTER — Emergency Department (HOSPITAL_COMMUNITY)
Admission: EM | Admit: 2014-01-11 | Discharge: 2014-01-11 | Disposition: A | Discharge status: Home / Self Care | Payer: No Typology Code available for payment source | Attending: Emergency Medicine | Admitting: Emergency Medicine

## 2014-01-11 DIAGNOSIS — Z79899 Other long term (current) drug therapy: Secondary | ICD-10-CM | POA: Insufficient documentation

## 2014-01-11 DIAGNOSIS — Z794 Long term (current) use of insulin: Secondary | ICD-10-CM | POA: Insufficient documentation

## 2014-01-11 DIAGNOSIS — J449 Chronic obstructive pulmonary disease, unspecified: Secondary | ICD-10-CM | POA: Insufficient documentation

## 2014-01-11 DIAGNOSIS — Z720 Tobacco use: Secondary | ICD-10-CM | POA: Insufficient documentation

## 2014-01-11 DIAGNOSIS — R0789 Other chest pain: Secondary | ICD-10-CM

## 2014-01-11 DIAGNOSIS — Z8739 Personal history of other diseases of the musculoskeletal system and connective tissue: Secondary | ICD-10-CM | POA: Insufficient documentation

## 2014-01-11 DIAGNOSIS — I10 Essential (primary) hypertension: Secondary | ICD-10-CM | POA: Insufficient documentation

## 2014-01-11 DIAGNOSIS — Y9389 Activity, other specified: Secondary | ICD-10-CM | POA: Insufficient documentation

## 2014-01-11 DIAGNOSIS — R079 Chest pain, unspecified: Secondary | ICD-10-CM

## 2014-01-11 DIAGNOSIS — Z91040 Latex allergy status: Secondary | ICD-10-CM | POA: Insufficient documentation

## 2014-01-11 DIAGNOSIS — E119 Type 2 diabetes mellitus without complications: Secondary | ICD-10-CM | POA: Insufficient documentation

## 2014-01-11 DIAGNOSIS — S299XXA Unspecified injury of thorax, initial encounter: Secondary | ICD-10-CM | POA: Insufficient documentation

## 2014-01-11 DIAGNOSIS — I509 Heart failure, unspecified: Secondary | ICD-10-CM | POA: Insufficient documentation

## 2014-01-11 DIAGNOSIS — Y998 Other external cause status: Secondary | ICD-10-CM | POA: Insufficient documentation

## 2014-01-11 DIAGNOSIS — Y92410 Unspecified street and highway as the place of occurrence of the external cause: Secondary | ICD-10-CM | POA: Insufficient documentation

## 2014-01-11 MED ORDER — METHOCARBAMOL 500 MG PO TABS: 500.0000 mg | ORAL_TABLET | Freq: Three times a day (TID) | ORAL | Status: DC

## 2014-01-11 MED ORDER — MELOXICAM 15 MG PO TABS: 15.0000 mg | ORAL_TABLET | Freq: Every day | ORAL | Status: DC

## 2014-01-11 MED ORDER — ACETAMINOPHEN-CODEINE #3 300-30 MG PO TABS: 1.0000 | ORAL_TABLET | Freq: Four times a day (QID) | ORAL | Status: DC | PRN
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
## 2014-02-15 ENCOUNTER — Emergency Department (HOSPITAL_COMMUNITY)
Admission: EM | Admit: 2014-02-15 | Discharge: 2014-02-15 | Disposition: A | Discharge status: Home / Self Care | Payer: No Typology Code available for payment source | Attending: Emergency Medicine | Admitting: Emergency Medicine

## 2014-02-15 ENCOUNTER — Encounter (HOSPITAL_COMMUNITY): Payer: Self-pay | Admitting: Emergency Medicine

## 2014-02-15 DIAGNOSIS — Z8739 Personal history of other diseases of the musculoskeletal system and connective tissue: Secondary | ICD-10-CM | POA: Insufficient documentation

## 2014-02-15 DIAGNOSIS — Z3202 Encounter for pregnancy test, result negative: Secondary | ICD-10-CM | POA: Insufficient documentation

## 2014-02-15 DIAGNOSIS — L293 Anogenital pruritus, unspecified: Secondary | ICD-10-CM | POA: Insufficient documentation

## 2014-02-15 DIAGNOSIS — Z79899 Other long term (current) drug therapy: Secondary | ICD-10-CM | POA: Insufficient documentation

## 2014-02-15 DIAGNOSIS — Z791 Long term (current) use of non-steroidal anti-inflammatories (NSAID): Secondary | ICD-10-CM | POA: Insufficient documentation

## 2014-02-15 DIAGNOSIS — N898 Other specified noninflammatory disorders of vagina: Secondary | ICD-10-CM | POA: Insufficient documentation

## 2014-02-15 DIAGNOSIS — Z794 Long term (current) use of insulin: Secondary | ICD-10-CM | POA: Insufficient documentation

## 2014-02-15 DIAGNOSIS — Z91040 Latex allergy status: Secondary | ICD-10-CM | POA: Insufficient documentation

## 2014-02-15 DIAGNOSIS — I10 Essential (primary) hypertension: Secondary | ICD-10-CM | POA: Insufficient documentation

## 2014-02-15 DIAGNOSIS — Z9889 Other specified postprocedural states: Secondary | ICD-10-CM | POA: Insufficient documentation

## 2014-02-15 DIAGNOSIS — J449 Chronic obstructive pulmonary disease, unspecified: Secondary | ICD-10-CM | POA: Insufficient documentation

## 2014-02-15 DIAGNOSIS — I509 Heart failure, unspecified: Secondary | ICD-10-CM | POA: Insufficient documentation

## 2014-02-15 DIAGNOSIS — E119 Type 2 diabetes mellitus without complications: Secondary | ICD-10-CM | POA: Insufficient documentation

## 2014-02-15 DIAGNOSIS — Z720 Tobacco use: Secondary | ICD-10-CM | POA: Insufficient documentation

## 2014-02-15 LAB — URINE MICROSCOPIC-ADD ON

## 2014-02-15 LAB — URINALYSIS, ROUTINE W REFLEX MICROSCOPIC
Bilirubin Urine: NEGATIVE
Glucose, UA: NEGATIVE mg/dL
HGB URINE DIPSTICK: NEGATIVE
Ketones, ur: NEGATIVE mg/dL
NITRITE: NEGATIVE
Protein, ur: NEGATIVE mg/dL
Specific Gravity, Urine: 1.02 (ref 1.005–1.030)
Urobilinogen, UA: 0.2 mg/dL (ref 0.0–1.0)
pH: 5.5 (ref 5.0–8.0)

## 2014-02-15 LAB — WET PREP, GENITAL
Clue Cells Wet Prep HPF POC: NONE SEEN
Trich, Wet Prep: NONE SEEN
Yeast Wet Prep HPF POC: NONE SEEN

## 2014-02-15 LAB — PREGNANCY, URINE: Preg Test, Ur: NEGATIVE

## 2014-02-15 MED ORDER — ONDANSETRON 4 MG PO TBDP
4.0000 mg | ORAL_TABLET | Freq: Once | ORAL | Status: AC
  Administered 2014-02-15: 4 mg via ORAL
  Filled 2014-02-15: qty 1

## 2014-02-15 NOTE — Discharge Instructions (Signed)
If any of your testing proves positive, we will contact you regarding treatment.   Please follow-up with your primary care physician.  Avoid unprotected intercourse.

## 2014-02-15 NOTE — ED Notes (Signed)
Pt reports nausea and dizziness that started Sun. Pt denies v/d.

## 2014-02-15 NOTE — ED Provider Notes (Addendum)
CSN: [id number]     Arrival date & time 02/15/14  1158 History   First MD Initiated Contact with Patient 02/15/14 1439     Chief Complaint  Patient presents with  . Dizziness      HPI  Patient complains of nausea"-Have an STD". She was diagnosed and treated for trich in December.  Uncertain if partner was treated.  Had unprotected intercourse with the same partner 6 days ago. Diagnoses nausea 2 days ago. Also vaginal itching and burning. No discharge. No sores bumps or lesions.  Oral and vaginal intercourse. Denies any additional symptoms.  Past Medical History  Diagnosis Date  . CHF (congestive heart failure)     minamal  . Cardiomyopathy   . Hypertension   . Diabetes mellitus   . Normal echocardiogram     LVEF 25-30% 03/27/2010  . COPD (chronic obstructive pulmonary disease)   . Non compliance w medication regimen   . DDD (degenerative disc disease), lumbar    Past Surgical History  Procedure Laterality Date  . Cardiac catheterization    . Tubal ligation     Family History  Problem Relation Age of Onset  . Stroke Mother   . Lung cancer Father    History  Substance Use Topics  . Smoking status: Current Every Day Smoker -- 0.50 packs/day    Types: Cigarettes  . Smokeless tobacco: Not on file  . Alcohol Use: No   OB History    Gravida Para Term Preterm AB TAB SAB Ectopic Multiple Living   7 5 5  2  2   5      Review of Systems  Constitutional: Negative for fever, chills, diaphoresis, appetite change and fatigue.  HENT: Negative for mouth sores, sore throat and trouble swallowing.   Eyes: Negative for visual disturbance.  Respiratory: Negative for cough, chest tightness, shortness of breath and wheezing.   Cardiovascular: Negative for chest pain.  Gastrointestinal: Negative for nausea, vomiting, abdominal pain, diarrhea and abdominal distention.  Endocrine: Negative for polydipsia, polyphagia and polyuria.  Genitourinary: Negative for dysuria, frequency and  hematuria.       Vaginal itching and burning. Denies discharge. Denies sores. Denies lesions.  Musculoskeletal: Negative for gait problem.  Skin: Negative for color change, pallor and rash.  Neurological: Negative for dizziness, syncope, light-headedness and headaches.  Hematological: Does not bruise/bleed easily.  Psychiatric/Behavioral: Negative for behavioral problems and confusion.      Allergies  Banana and Latex  Home Medications   Prior to Admission medications   Medication Sig Start Date End Date Taking? Authorizing Provider  acetaminophen (TYLENOL) 500 MG tablet Take 1,500 mg by mouth daily as needed for mild pain or headache.   Yes Historical Provider, MD  insulin glargine (LANTUS) 100 UNIT/ML injection Inject 35 Units into the skin at bedtime.   Yes Historical Provider, MD  lisinopril-hydrochlorothiazide (PRINZIDE,ZESTORETIC) 20-12.5 MG per tablet Take 1 tablet by mouth daily.   Yes Historical Provider, MD  metFORMIN (GLUCOPHAGE) 1000 MG tablet Take 1,000 mg by mouth 2 (two) times daily.   Yes Historical Provider, MD  Multiple Vitamin (MULTIVITAMIN WITH MINERALS) TABS tablet Take 1 tablet by mouth daily.   Yes Historical Provider, MD  Multiple Vitamins-Minerals (HAIR/SKIN/NAILS/BIOTIN) TABS Take 5 tablets by mouth daily.   Yes Historical Provider, MD  acetaminophen-codeine (TYLENOL #3) 300-30 MG per tablet Take 1-2 tablets by mouth every 6 (six) hours as needed. Patient not taking: Reported on 02/15/2014 01/11/14   Kathie Dike, PA-C  cyclobenzaprine [facility])  10 MG tablet Take 1 tablet (10 mg total) by mouth 3 (three) times daily as needed for muscle spasms. Patient not taking: Reported on 02/15/2014 11/05/13   Rolland Porter, MD  diphenhydrAMINE (BENADRYL) 25 MG tablet Take 1 tablet (25 mg total) by mouth every 4 (four) hours as needed. Patient not taking: Reported on 02/15/2014 10/02/13   Tammy L. Triplett, PA-C  ibuprofen (ADVIL,MOTRIN) 800 MG tablet Take 1 tablet (800 mg  total) by mouth every 8 (eight) hours as needed for mild pain. Patient not taking: Reported on 02/15/2014 01/07/14   Kristen N Ward, DO  lisinopril (PRINIVIL) 10 MG tablet Take 1 tablet (10 mg total) by mouth daily. Patient not taking: Reported on 02/15/2014 07/05/13   Benny Lennert, MD  meloxicam (MOBIC) 15 MG tablet Take 1 tablet (15 mg total) by mouth daily. Patient not taking: Reported on 02/15/2014 01/11/14   Kathie Dike, PA-C  methocarbamol (ROBAXIN) 500 MG tablet Take 1 tablet (500 mg total) by mouth 3 (three) times daily. Patient not taking: Reported on 02/15/2014 01/11/14   Kathie Dike, PA-C  traMADol (ULTRAM) 50 MG tablet Take 1 tablet (50 mg total) by mouth every 6 (six) hours as needed. Patient not taking: Reported on 02/15/2014 01/07/14   Kristen N Ward, DO   BP 139/87 mmHg  Pulse 97  Temp(Src) 99 F (37.2 C) (Oral)  Resp 18  Ht  (1.499 m)  Wt 148 lb (67.132 kg)  BMI 29.88 kg/m2  SpO2 95%  LMP 08/02/2010 Physical Exam  Constitutional: She is oriented to person, place, and time. She appears well-developed and well-nourished. No distress.  HENT:  Head: Normocephalic.  Eyes: Conjunctivae are normal. Pupils are equal, round, and reactive to light. No scleral icterus.  Neck: Normal range of motion. Neck supple. No thyromegaly present.  Cardiovascular: Normal rate and regular rhythm.  Exam reveals no gallop and no friction rub.   No murmur heard. Pulmonary/Chest: Effort normal and breath sounds normal. No respiratory distress. She has no wheezes. She has no rales.  Abdominal: Soft. Bowel sounds are normal. She exhibits no distension. There is no tenderness. There is no rebound.  Genitourinary:    Musculoskeletal: Normal range of motion.  Neurological: She is alert and oriented to person, place, and time.  Skin: Skin is warm and dry. No rash noted.  Psychiatric: She has a normal mood and affect. Her behavior is normal.    ED Course  Procedures (including  critical care time) Labs Review Labs Reviewed  WET PREP, GENITAL - Abnormal; Notable for the following:    WBC, Wet Prep HPF POC MODERATE (*)    All other components within normal limits  URINALYSIS, ROUTINE W REFLEX MICROSCOPIC - Abnormal; Notable for the following:    Leukocytes, UA MODERATE (*)    All other components within normal limits  URINE MICROSCOPIC-ADD ON - Abnormal; Notable for the following:    Squamous Epithelial / LPF FEW (*)    Bacteria, UA FEW (*)    All other components within normal limits  PREGNANCY, URINE  RPR  HIV ANTIBODY (ROUTINE TESTING)  GC/CHLAMYDIA PROBE AMP ([place])    Imaging Review No results found.   EKG Interpretation None      MDM   Final diagnoses:  Vaginal itching    Negative wet prep. GC, Chlamydia, RPR, HIV pending. Urinalysis not appear infected. We discussed postexposure prophylaxis versus awaiting culture results. She would prefer to wait rather than empiric treatment.  Rolland Porter, MD 02/15/14 1930  Rolland Porter, MD 02/15/14 [phone]

## 2014-02-16 LAB — GC/CHLAMYDIA PROBE AMP (~~LOC~~) NOT AT ARMC
Chlamydia: NEGATIVE
NEISSERIA GONORRHEA: NEGATIVE

## 2014-02-16 LAB — HIV ANTIBODY (ROUTINE TESTING W REFLEX): HIV Screen 4th Generation wRfx: NONREACTIVE

## 2014-02-16 LAB — RPR: RPR Ser Ql: NONREACTIVE

## 2014-06-30 ENCOUNTER — Emergency Department (HOSPITAL_COMMUNITY)
Admission: EM | Admit: 2014-06-30 | Discharge: 2014-06-30 | Disposition: A | Discharge status: Home / Self Care | Payer: Self-pay | Attending: Emergency Medicine | Admitting: Emergency Medicine

## 2014-06-30 ENCOUNTER — Encounter (HOSPITAL_COMMUNITY): Payer: Self-pay | Admitting: Emergency Medicine

## 2014-06-30 DIAGNOSIS — R109 Unspecified abdominal pain: Secondary | ICD-10-CM | POA: Insufficient documentation

## 2014-06-30 DIAGNOSIS — Z79899 Other long term (current) drug therapy: Secondary | ICD-10-CM | POA: Insufficient documentation

## 2014-06-30 DIAGNOSIS — Z720 Tobacco use: Secondary | ICD-10-CM | POA: Insufficient documentation

## 2014-06-30 DIAGNOSIS — E119 Type 2 diabetes mellitus without complications: Secondary | ICD-10-CM | POA: Insufficient documentation

## 2014-06-30 DIAGNOSIS — I509 Heart failure, unspecified: Secondary | ICD-10-CM | POA: Insufficient documentation

## 2014-06-30 DIAGNOSIS — I10 Essential (primary) hypertension: Secondary | ICD-10-CM | POA: Insufficient documentation

## 2014-06-30 DIAGNOSIS — Z9889 Other specified postprocedural states: Secondary | ICD-10-CM | POA: Insufficient documentation

## 2014-06-30 DIAGNOSIS — J449 Chronic obstructive pulmonary disease, unspecified: Secondary | ICD-10-CM | POA: Insufficient documentation

## 2014-06-30 DIAGNOSIS — Z8739 Personal history of other diseases of the musculoskeletal system and connective tissue: Secondary | ICD-10-CM | POA: Insufficient documentation

## 2014-06-30 DIAGNOSIS — Z794 Long term (current) use of insulin: Secondary | ICD-10-CM | POA: Insufficient documentation

## 2014-06-30 DIAGNOSIS — Z91040 Latex allergy status: Secondary | ICD-10-CM | POA: Insufficient documentation

## 2014-06-30 DIAGNOSIS — Z9119 Patient's noncompliance with other medical treatment and regimen: Secondary | ICD-10-CM | POA: Insufficient documentation

## 2014-06-30 LAB — URINALYSIS, ROUTINE W REFLEX MICROSCOPIC
BILIRUBIN URINE: NEGATIVE
GLUCOSE, UA: NEGATIVE mg/dL
Hgb urine dipstick: NEGATIVE
LEUKOCYTES UA: NEGATIVE
NITRITE: NEGATIVE
PROTEIN: NEGATIVE mg/dL
Specific Gravity, Urine: 1.025 (ref 1.005–1.030)
Urobilinogen, UA: 0.2 mg/dL (ref 0.0–1.0)
pH: 5.5 (ref 5.0–8.0)

## 2014-06-30 MED ORDER — IBUPROFEN 600 MG PO TABS: 600.0000 mg | ORAL_TABLET | Freq: Three times a day (TID) | ORAL | Status: DC | PRN

## 2014-06-30 MED ORDER — OXYCODONE-ACETAMINOPHEN 5-325 MG PO TABS
1.0000 | ORAL_TABLET | Freq: Once | ORAL | Status: AC
  Administered 2014-06-30: 1 via ORAL
  Filled 2014-06-30: qty 1

## 2014-06-30 MED ORDER — KETOROLAC TROMETHAMINE 60 MG/2ML IM SOLN
60.0000 mg | Freq: Once | INTRAMUSCULAR | Status: AC
  Administered 2014-06-30: 60 mg via INTRAMUSCULAR
  Filled 2014-06-30: qty 2

## 2014-06-30 MED ORDER — CYCLOBENZAPRINE HCL 10 MG PO TABS: 10.0000 mg | ORAL_TABLET | Freq: Three times a day (TID) | ORAL | Status: DC | PRN

## 2014-06-30 MED ORDER — CYCLOBENZAPRINE HCL 10 MG PO TABS
10.0000 mg | ORAL_TABLET | Freq: Once | ORAL | Status: AC
  Administered 2014-06-30: 10 mg via ORAL
  Filled 2014-06-30: qty 1

## 2014-06-30 NOTE — ED Notes (Signed)
Pt reports L flank pain that started on Monday. Pt denies n/v/d. Denies urinary symptoms.

## 2014-06-30 NOTE — ED Notes (Signed)
Assumed care of patient from [place], [place]. EDP at bedside

## 2014-06-30 NOTE — ED Notes (Signed)
Patient states intermittent pain to left flank/ back area. No CVA tenderness noted. Pt denies urinary symptoms, denies N/V/D.Pt states pain is worse with certain movement and walking.  Pt is unable to provide a urine sample at this time, stating she just went before leaving home.

## 2014-06-30 NOTE — Discharge Instructions (Signed)
Back Pain, Adult Low back pain is very common. About 1 in 5 people have back pain.The cause of low back pain is rarely dangerous. The pain often gets better over time.About half of people with a sudden onset of back pain feel better in just 2 weeks. About 8 in 10 people feel better by 6 weeks.  CAUSES Some common causes of back pain include:  Strain of the muscles or ligaments supporting the spine.  Wear and tear (degeneration) of the spinal discs.  Arthritis.  Direct injury to the back. DIAGNOSIS Most of the time, the direct cause of low back pain is not known.However, back pain can be treated effectively even when the exact cause of the pain is unknown.Answering your caregiver's questions about your overall health and symptoms is one of the most accurate ways to make sure the cause of your pain is not dangerous. If your caregiver needs more information, he or she may order lab work or imaging tests (X-rays or MRIs).However, even if imaging tests show changes in your back, this usually does not require surgery. HOME CARE INSTRUCTIONS For many people, back pain returns.Since low back pain is rarely dangerous, it is often a condition that people can learn to manageon their own.   Remain active. It is stressful on the back to sit or stand in one place. Do not sit, drive, or stand in one place for more than 30 minutes at a time. Take short walks on level surfaces as soon as pain allows.Try to increase the length of time you walk each day.  Do not stay in bed.Resting more than 1 or 2 days can delay your recovery.  Do not avoid exercise or work.Your body is made to move.It is not dangerous to be active, even though your back may hurt.Your back will likely heal faster if you return to being active before your pain is gone.  Pay attention to your body when you bend and lift. Many people have less discomfortwhen lifting if they bend their knees, keep the load close to their bodies,and  avoid twisting. Often, the most comfortable positions are those that put less stress on your recovering back.  Find a comfortable position to sleep. Use a firm mattress and lie on your side with your knees slightly bent. If you lie on your back, put a pillow under your knees.  Only take over-the-counter or prescription medicines as directed by your caregiver. Over-the-counter medicines to reduce pain and inflammation are often the most helpful.Your caregiver may prescribe muscle relaxant drugs.These medicines help dull your pain so you can more quickly return to your normal activities and healthy exercise.  Put ice on the injured area.  Put ice in a plastic bag.  Place a towel between your skin and the bag.  Leave the ice on for 15-20 minutes, 03-04 times a day for the first 2 to 3 days. After that, ice and heat may be alternated to reduce pain and spasms.  Ask your caregiver about trying back exercises and gentle massage. This may be of some benefit.  Avoid feeling anxious or stressed.Stress increases muscle tension and can worsen back pain.It is important to recognize when you are anxious or stressed and learn ways to manage it.Exercise is a great option. SEEK MEDICAL CARE IF:  You have pain that is not relieved with rest or medicine.  You have pain that does not improve in 1 week.  You have new symptoms.  You are generally not feeling well. SEEK   IMMEDIATE MEDICAL CARE IF:   You have pain that radiates from your back into your legs.  You develop new bowel or bladder control problems.  You have unusual weakness or numbness in your arms or legs.  You develop nausea or vomiting.  You develop abdominal pain.  You feel faint. Document Released: 01/07/2005 Document Revised: 07/09/2011 Document Reviewed: 05/11/2013 [employer] Patient Information 2015 [employer]. This information is not intended to replace advice given to you by your health care provider. Make sure you  discuss any questions you have with your health care provider.  

## 2014-06-30 NOTE — ED Provider Notes (Signed)
CSN: [id number]     Arrival date & time 06/30/14  1826 History   First MD Initiated Contact with Patient 06/30/14 1847     Chief Complaint  Patient presents with  . Flank Pain      HPI Patient presents with left upper back pain over the past several days.  No urinary symptoms.  Denies nausea vomiting diarrhea.  Denies abdominal pain.  She states her left upper back pain is worse with movement.  No chest pain or shortness of breath.  She denies weakness in arms or legs.  No injury or trauma that she can remember.  She states she awoke from sleep this way.  Her pain is been constant and moderate in severity.   Past Medical History  Diagnosis Date  . CHF (congestive heart failure)     minamal  . Cardiomyopathy   . Hypertension   . Diabetes mellitus   . Normal echocardiogram     LVEF 25-30% 03/27/2010  . COPD (chronic obstructive pulmonary disease)   . Non compliance w medication regimen   . DDD (degenerative disc disease), lumbar    Past Surgical History  Procedure Laterality Date  . Cardiac catheterization    . Tubal ligation     Family History  Problem Relation Age of Onset  . Stroke Mother   . Lung cancer Father    History  Substance Use Topics  . Smoking status: Current Every Day Smoker -- 0.50 packs/day    Types: Cigarettes  . Smokeless tobacco: Not on file  . Alcohol Use: No   OB History    Gravida Para Term Preterm AB TAB SAB Ectopic Multiple Living   7 5 5  2  2   5      Review of Systems  All other systems reviewed and are negative.     Allergies  Banana and Latex  Home Medications   Prior to Admission medications   Medication Sig Start Date End Date Taking? Authorizing Provider  Insulin Glargine (LANTUS) 100 UNIT/ML Solostar Pen Inject 35 Units into the skin at bedtime.   Yes Historical Provider, MD  lisinopril-hydrochlorothiazide (PRINZIDE,ZESTORETIC) 20-12.5 MG per tablet Take 1 tablet by mouth daily.   Yes Historical Provider, MD  metFORMIN  (GLUCOPHAGE) 1000 MG tablet Take 1,000 mg by mouth 2 (two) times daily.   Yes Historical Provider, MD  Multiple Vitamin (MULTIVITAMIN WITH MINERALS) TABS tablet Take 1 tablet by mouth daily.   Yes Historical Provider, MD  Multiple Vitamins-Minerals (HAIR/SKIN/NAILS/BIOTIN) TABS Take 5 tablets by mouth daily.   Yes Historical Provider, MD  acetaminophen (TYLENOL) 500 MG tablet Take 1,500 mg by mouth daily as needed for mild pain or headache.    Historical Provider, MD  cyclobenzaprine (FLEXERIL) 10 MG tablet Take 1 tablet (10 mg total) by mouth 3 (three) times daily as needed for muscle spasms. 06/30/14   Azalia Bilis, MD  ibuprofen (ADVIL,MOTRIN) 600 MG tablet Take 1 tablet (600 mg total) by mouth every 8 (eight) hours as needed. 06/30/14   Azalia Bilis, MD  traMADol (ULTRAM) 50 MG tablet Take 1 tablet (50 mg total) by mouth every 6 (six) hours as needed. Patient not taking: Reported on 02/15/2014 01/07/14   Kristen N Ward, DO   BP 135/84 mmHg  Pulse 88  Temp(Src) 98.4 F (36.9 C) (Oral)  Resp 18  Ht 4\' 11"  (1.499 m)  Wt 147 lb (66.679 kg)  BMI 29.67 kg/m2  SpO2 100%  LMP 08/02/2010 Physical Exam  Constitutional: She  is oriented to person, place, and time. She appears well-developed and well-nourished. No distress.  HENT:  Head: Normocephalic and atraumatic.  Eyes: EOM are normal.  Neck: Normal range of motion.  Cardiovascular: Normal rate, regular rhythm and normal heart sounds.   Pulmonary/Chest: Effort normal and breath sounds normal.  Abdominal: Soft. She exhibits no distension. There is no tenderness.  Musculoskeletal: Normal range of motion.  No thoracic or lumbar point tenderness.  Mild thoracic tenderness on the left  Neurological: She is alert and oriented to person, place, and time.  Skin: Skin is warm and dry.  Psychiatric: She has a normal mood and affect. Judgment normal.  Nursing note and vitals reviewed.   ED Course  Procedures (including critical care time) Labs  Review Labs Reviewed  URINALYSIS, ROUTINE W REFLEX MICROSCOPIC (NOT AT [facility]) - Abnormal; Notable for the following:    Ketones, ur TRACE (*)    All other components within normal limits    Imaging Review No results found.   EKG Interpretation None      MDM   Final diagnoses:  Left flank pain    Urine without blood.  No signs of infection.  Abdominal exam is benign.  This seems to be a musculoskeletal back issue.  No point tenderness.  No injury.  No indication for imaging.  Patient improved with anti-inflammatories.  Discharge home in good condition.    Azalia Bilis, MD 06/30/14 2104

## 2014-07-06 ENCOUNTER — Emergency Department (HOSPITAL_COMMUNITY)
Admission: EM | Admit: 2014-07-06 | Discharge: 2014-07-06 | Disposition: A | Discharge status: Home / Self Care | Payer: Self-pay | Attending: Emergency Medicine | Admitting: Emergency Medicine

## 2014-07-06 ENCOUNTER — Encounter (HOSPITAL_COMMUNITY): Payer: Self-pay | Admitting: Emergency Medicine

## 2014-07-06 DIAGNOSIS — Z9889 Other specified postprocedural states: Secondary | ICD-10-CM | POA: Insufficient documentation

## 2014-07-06 DIAGNOSIS — R110 Nausea: Secondary | ICD-10-CM | POA: Insufficient documentation

## 2014-07-06 DIAGNOSIS — Z91040 Latex allergy status: Secondary | ICD-10-CM | POA: Insufficient documentation

## 2014-07-06 DIAGNOSIS — Z720 Tobacco use: Secondary | ICD-10-CM | POA: Insufficient documentation

## 2014-07-06 DIAGNOSIS — M545 Low back pain, unspecified: Secondary | ICD-10-CM

## 2014-07-06 DIAGNOSIS — Z79899 Other long term (current) drug therapy: Secondary | ICD-10-CM | POA: Insufficient documentation

## 2014-07-06 DIAGNOSIS — Z9114 Patient's other noncompliance with medication regimen: Secondary | ICD-10-CM | POA: Insufficient documentation

## 2014-07-06 DIAGNOSIS — J449 Chronic obstructive pulmonary disease, unspecified: Secondary | ICD-10-CM | POA: Insufficient documentation

## 2014-07-06 DIAGNOSIS — I509 Heart failure, unspecified: Secondary | ICD-10-CM | POA: Insufficient documentation

## 2014-07-06 DIAGNOSIS — E119 Type 2 diabetes mellitus without complications: Secondary | ICD-10-CM | POA: Insufficient documentation

## 2014-07-06 DIAGNOSIS — Z794 Long term (current) use of insulin: Secondary | ICD-10-CM | POA: Insufficient documentation

## 2014-07-06 DIAGNOSIS — I10 Essential (primary) hypertension: Secondary | ICD-10-CM | POA: Insufficient documentation

## 2014-07-06 LAB — URINALYSIS, ROUTINE W REFLEX MICROSCOPIC
Bilirubin Urine: NEGATIVE
GLUCOSE, UA: NEGATIVE mg/dL
Hgb urine dipstick: NEGATIVE
Ketones, ur: NEGATIVE mg/dL
Leukocytes, UA: NEGATIVE
NITRITE: NEGATIVE
PROTEIN: NEGATIVE mg/dL
Specific Gravity, Urine: 1.014 (ref 1.005–1.030)
Urobilinogen, UA: 0.2 mg/dL (ref 0.0–1.0)
pH: 5 (ref 5.0–8.0)

## 2014-07-06 MED ORDER — MORPHINE SULFATE 4 MG/ML IJ SOLN
8.0000 mg | Freq: Once | INTRAMUSCULAR | Status: AC
  Administered 2014-07-06: 8 mg via INTRAMUSCULAR
  Filled 2014-07-06: qty 2

## 2014-07-06 MED ORDER — TRAMADOL HCL 50 MG PO TABS: 50.0000 mg | ORAL_TABLET | Freq: Four times a day (QID) | ORAL | Status: DC | PRN

## 2014-07-06 NOTE — Discharge Instructions (Signed)
Back Pain, Adult Low back pain is very common. About 1 in 5 people have back pain.The cause of low back pain is rarely dangerous. The pain often gets better over time.About half of people with a sudden onset of back pain feel better in just 2 weeks. About 8 in 10 people feel better by 6 weeks.  CAUSES Some common causes of back pain include:  Strain of the muscles or ligaments supporting the spine.  Wear and tear (degeneration) of the spinal discs.  Arthritis.  Direct injury to the back. DIAGNOSIS Most of the time, the direct cause of low back pain is not known.However, back pain can be treated effectively even when the exact cause of the pain is unknown.Answering your caregiver's questions about your overall health and symptoms is one of the most accurate ways to make sure the cause of your pain is not dangerous. If your caregiver needs more information, he or she may order lab work or imaging tests (X-rays or MRIs).However, even if imaging tests show changes in your back, this usually does not require surgery. HOME CARE INSTRUCTIONS For many people, back pain returns.Since low back pain is rarely dangerous, it is often a condition that people can learn to manageon their own.   Remain active. It is stressful on the back to sit or stand in one place. Do not sit, drive, or stand in one place for more than 30 minutes at a time. Take short walks on level surfaces as soon as pain allows.Try to increase the length of time you walk each day.  Do not stay in bed.Resting more than 1 or 2 days can delay your recovery.  Do not avoid exercise or work.Your body is made to move.It is not dangerous to be active, even though your back may hurt.Your back will likely heal faster if you return to being active before your pain is gone.  Pay attention to your body when you bend and lift. Many people have less discomfortwhen lifting if they bend their knees, keep the load close to their bodies,and  avoid twisting. Often, the most comfortable positions are those that put less stress on your recovering back.  Find a comfortable position to sleep. Use a firm mattress and lie on your side with your knees slightly bent. If you lie on your back, put a pillow under your knees.  Only take over-the-counter or prescription medicines as directed by your caregiver. Over-the-counter medicines to reduce pain and inflammation are often the most helpful.Your caregiver may prescribe muscle relaxant drugs.These medicines help dull your pain so you can more quickly return to your normal activities and healthy exercise.  Put ice on the injured area.  Put ice in a plastic bag.  Place a towel between your skin and the bag.  Leave the ice on for 15-20 minutes, 03-04 times a day for the first 2 to 3 days. After that, ice and heat may be alternated to reduce pain and spasms.  Ask your caregiver about trying back exercises and gentle massage. This may be of some benefit.  Avoid feeling anxious or stressed.Stress increases muscle tension and can worsen back pain.It is important to recognize when you are anxious or stressed and learn ways to manage it.Exercise is a great option. SEEK MEDICAL CARE IF:  You have pain that is not relieved with rest or medicine.  You have pain that does not improve in 1 week.  You have new symptoms.  You are generally not feeling well. SEEK   IMMEDIATE MEDICAL CARE IF:   You have pain that radiates from your back into your legs.  You develop new bowel or bladder control problems.  You have unusual weakness or numbness in your arms or legs.  You develop nausea or vomiting.  You develop abdominal pain.  You feel faint. Document Released: 01/07/2005 Document Revised: 07/09/2011 Document Reviewed: 05/11/2013 [employer] Patient Information 2015 [employer]. This information is not intended to replace advice given to you by your health care provider. Make sure you  discuss any questions you have with your health care provider.  

## 2014-07-06 NOTE — ED Notes (Signed)
MD at bedside. 

## 2014-07-06 NOTE — ED Provider Notes (Signed)
CSN: [id number]     Arrival date & time 07/06/14  1320 History   First MD Initiated Contact with Patient 07/06/14 1600     Chief Complaint  Patient presents with  . Back Pain     (Consider location/radiation/quality/duration/timing/severity/associated sxs/prior Treatment) HPI Comments: Patient presents with back pain. She states it's been going on about a week and slowly worsening. She states it's in the left side of her back and radiates to the flank. She denies abdominal pain. She's had some nausea but no vomiting. She states the pain is worse with movements. She denies any radiation down her legs. She denies any numbness or weakness in her legs. She denies any urinary symptoms or hematuria. She denies any fevers or chills. She's had problems with her lumbar spine in the past. She was seen in any pain on June 9 for similar symptoms. She was discharged with prescriptions for ibuprofen and Flexeril. She states she was also taking some tramadol that she had left over but is currently out of the tramadol. She doesn't report any improvement of symptoms. She has a follow-up appointment with her primary care physician next week.  Patient is a 54 y.o. female presenting with back pain.  Back Pain Associated symptoms: no abdominal pain, no chest pain, no fever, no headaches, no numbness and no weakness     Past Medical History  Diagnosis Date  . CHF (congestive heart failure)     minamal  . Cardiomyopathy   . Hypertension   . Diabetes mellitus   . Normal echocardiogram     LVEF 25-30% 03/27/2010  . COPD (chronic obstructive pulmonary disease)   . Non compliance w medication regimen   . DDD (degenerative disc disease), lumbar    Past Surgical History  Procedure Laterality Date  . Cardiac catheterization    . Tubal ligation     Family History  Problem Relation Age of Onset  . Stroke Mother   . Lung cancer Father    History  Substance Use Topics  . Smoking status: Current Every Day  Smoker -- 0.50 packs/day    Types: Cigarettes  . Smokeless tobacco: Not on file  . Alcohol Use: No   OB History    Gravida Para Term Preterm AB TAB SAB Ectopic Multiple Living   7 5 5  2  2   5      Review of Systems  Constitutional: Negative for fever, chills, diaphoresis and fatigue.  HENT: Negative for congestion, rhinorrhea and sneezing.   Eyes: Negative.   Respiratory: Negative for cough, chest tightness and shortness of breath.   Cardiovascular: Negative for chest pain and leg swelling.  Gastrointestinal: Positive for nausea. Negative for vomiting, abdominal pain, diarrhea and blood in stool.  Genitourinary: Negative for frequency, hematuria, flank pain and difficulty urinating.  Musculoskeletal: Positive for back pain. Negative for arthralgias.  Skin: Negative for rash.  Neurological: Negative for dizziness, speech difficulty, weakness, numbness and headaches.      Allergies  Banana and Latex  Home Medications   Prior to Admission medications   Medication Sig Start Date End Date Taking? Authorizing Provider  acetaminophen (TYLENOL) 500 MG tablet Take 1,500 mg by mouth daily as needed for mild pain or headache.   Yes Historical Provider, MD  cyclobenzaprine (FLEXERIL) 10 MG tablet Take 1 tablet (10 mg total) by mouth 3 (three) times daily as needed for muscle spasms. 06/30/14  Yes Azalia Bilis, MD  ibuprofen (ADVIL,MOTRIN) 600 MG tablet Take 1 tablet (600  mg total) by mouth every 8 (eight) hours as needed. Patient taking differently: Take 600 mg by mouth every 8 (eight) hours as needed for headache or moderate pain.  06/30/14  Yes Azalia Bilis, MD  Insulin Glargine (LANTUS) 100 UNIT/ML Solostar Pen Inject 35 Units into the skin at bedtime.   Yes Historical Provider, MD  lisinopril-hydrochlorothiazide (PRINZIDE,ZESTORETIC) 20-12.5 MG per tablet Take 1 tablet by mouth daily.   Yes Historical Provider, MD  metFORMIN (GLUCOPHAGE) 1000 MG tablet Take 1,000 mg by mouth 2 (two) times  daily.   Yes Historical Provider, MD  Multiple Vitamin (MULTIVITAMIN WITH MINERALS) TABS tablet Take 1 tablet by mouth daily.   Yes Historical Provider, MD  Multiple Vitamins-Minerals (HAIR/SKIN/NAILS/BIOTIN) TABS Take 5 tablets by mouth daily.   Yes Historical Provider, MD  traMADol (ULTRAM) 50 MG tablet Take 1 tablet (50 mg total) by mouth every 6 (six) hours as needed. 07/06/14   Rolan Bucco, MD   BP 180/90 mmHg  Pulse 82  Temp(Src) 98.5 F (36.9 C) (Oral)  Resp 18  SpO2 99%  LMP 08/02/2010 Physical Exam  Constitutional: She is oriented to person, place, and time. She appears well-developed and well-nourished.  HENT:  Head: Normocephalic and atraumatic.  Eyes: Pupils are equal, round, and reactive to light.  Neck: Normal range of motion. Neck supple.  Cardiovascular: Normal rate, regular rhythm and normal heart sounds.   Pulmonary/Chest: Effort normal and breath sounds normal. No respiratory distress. She has no wheezes. She has no rales. She exhibits no tenderness.  Abdominal: Soft. Bowel sounds are normal. There is no tenderness. There is no rebound and no guarding.  Musculoskeletal: Normal range of motion. She exhibits no edema.  Positive tenderness to the left lower back in the musculature. There is no spinal tenderness. Negative straight leg raise bilaterally. She has normal motor function and sensation in the lower extremities. Pedal pulses are intact.  Lymphadenopathy:    She has no cervical adenopathy.  Neurological: She is alert and oriented to person, place, and time.  Skin: Skin is warm and dry. No rash noted.  Psychiatric: She has a normal mood and affect.    ED Course  Procedures (including critical care time) Labs Review Labs Reviewed  URINALYSIS, ROUTINE W REFLEX MICROSCOPIC (NOT AT [facility])    Imaging Review No results found.   EKG Interpretation None      MDM   Final diagnoses:  Left-sided low back pain without sciatica    Patient presents with  lower back pain. There is no radicular symptoms. She has no neurologic deficits or signs of cauda equina. She has no abdominal tenderness. She has no evidence of a UTI or hematuria which we would be more indicative of possible kidney stone. Her symptoms seem musculoskeletal in nature. She was discharged home in good condition. She has a follow-up appointment with her primary care physician next week. She will continue the ibuprofen and Flexeril and I gave her prescription for tramadol.    Rolan Bucco, MD 07/06/14 [phone]

## 2014-07-06 NOTE — ED Notes (Signed)
Pt states that she was told at [facility] that she had a pulled muscle. States that the pain medicine they gave her only works for a short time.

## 2014-07-06 NOTE — ED Notes (Signed)
Pt sts left sided lower back pain; pt unsure if could be issue with kidneys; pt sts seen at [facility] for same and given meds with some relief but worse again this am; pt denies urinary sx

## 2014-10-13 ENCOUNTER — Emergency Department (HOSPITAL_COMMUNITY)
Admission: EM | Admit: 2014-10-13 | Discharge: 2014-10-13 | Disposition: A | Discharge status: Home / Self Care | Payer: Self-pay | Attending: Emergency Medicine | Admitting: Emergency Medicine

## 2014-10-13 ENCOUNTER — Encounter (HOSPITAL_COMMUNITY): Payer: Self-pay | Admitting: *Deleted

## 2014-10-13 DIAGNOSIS — J209 Acute bronchitis, unspecified: Secondary | ICD-10-CM | POA: Insufficient documentation

## 2014-10-13 DIAGNOSIS — Z9889 Other specified postprocedural states: Secondary | ICD-10-CM | POA: Insufficient documentation

## 2014-10-13 DIAGNOSIS — E119 Type 2 diabetes mellitus without complications: Secondary | ICD-10-CM | POA: Insufficient documentation

## 2014-10-13 DIAGNOSIS — Z9114 Patient's other noncompliance with medication regimen: Secondary | ICD-10-CM | POA: Insufficient documentation

## 2014-10-13 DIAGNOSIS — Z91040 Latex allergy status: Secondary | ICD-10-CM | POA: Insufficient documentation

## 2014-10-13 DIAGNOSIS — Z720 Tobacco use: Secondary | ICD-10-CM | POA: Insufficient documentation

## 2014-10-13 DIAGNOSIS — J441 Chronic obstructive pulmonary disease with (acute) exacerbation: Secondary | ICD-10-CM | POA: Insufficient documentation

## 2014-10-13 DIAGNOSIS — I509 Heart failure, unspecified: Secondary | ICD-10-CM | POA: Insufficient documentation

## 2014-10-13 DIAGNOSIS — I10 Essential (primary) hypertension: Secondary | ICD-10-CM | POA: Insufficient documentation

## 2014-10-13 DIAGNOSIS — Z8739 Personal history of other diseases of the musculoskeletal system and connective tissue: Secondary | ICD-10-CM | POA: Insufficient documentation

## 2014-10-13 DIAGNOSIS — Z79899 Other long term (current) drug therapy: Secondary | ICD-10-CM | POA: Insufficient documentation

## 2014-10-13 DIAGNOSIS — J40 Bronchitis, not specified as acute or chronic: Secondary | ICD-10-CM

## 2014-10-13 MED ORDER — ALBUTEROL SULFATE HFA 108 (90 BASE) MCG/ACT IN AERS
2.0000 | INHALATION_SPRAY | RESPIRATORY_TRACT | Status: DC | PRN
Start: 2014-10-13 — End: 2014-10-13
  Administered 2014-10-13: 2 via RESPIRATORY_TRACT
  Filled 2014-10-13: qty 6.7

## 2014-10-13 MED ORDER — DOXYCYCLINE HYCLATE 100 MG PO CAPS: 100.0000 mg | ORAL_CAPSULE | Freq: Two times a day (BID) | ORAL | Status: DC

## 2014-10-13 MED ORDER — HYDROXYZINE HCL 25 MG PO TABS: 25.0000 mg | ORAL_TABLET | Freq: Four times a day (QID) | ORAL | Status: DC

## 2014-10-13 NOTE — ED Provider Notes (Signed)
CSN: [id number]     Arrival date & time 10/13/14  1330 History   First MD Initiated Contact with Patient 10/13/14 1349     Chief Complaint  Patient presents with  . chest congestion       HPI Patient is a smoker who comes in with productive cough of yellow sputum for the last 2-3 days.  Has had some expiratory wheezing noted.  Denies fever chills nausea vomiting or diarrhea. Past Medical History  Diagnosis Date  . CHF (congestive heart failure)     minamal  . Cardiomyopathy   . Hypertension   . Diabetes mellitus   . Normal echocardiogram     LVEF 25-30% 03/27/2010  . COPD (chronic obstructive pulmonary disease)   . Non compliance w medication regimen   . DDD (degenerative disc disease), lumbar    Past Surgical History  Procedure Laterality Date  . Cardiac catheterization    . Tubal ligation     Family History  Problem Relation Age of Onset  . Stroke Mother   . Lung cancer Father    Social History  Substance Use Topics  . Smoking status: Current Every Day Smoker -- 0.50 packs/day    Types: Cigarettes  . Smokeless tobacco: None  . Alcohol Use: No   OB History    Gravida Para Term Preterm AB TAB SAB Ectopic Multiple Living   Review of Systems  All other systems reviewed and are negative  Allergies  Banana and Latex  Home Medications   Prior to Admission medications   Medication Sig Start Date End Date Taking? Authorizing Provider  acetaminophen (TYLENOL) 500 MG tablet Take 1,500 mg by mouth daily as needed for mild pain or headache.    Historical Provider, MD  cyclobenzaprine (FLEXERIL) 10 MG tablet Take 1 tablet (10 mg total) by mouth 3 (three) times daily as needed for muscle spasms. 06/30/14   Azalia Bilis, MD  doxycycline (VIBRAMYCIN) 100 MG capsule Take 1 capsule (100 mg total) by mouth 2 (two) times daily. 10/13/14   Nelva Nay, MD  hydrOXYzine (ATARAX/VISTARIL) 25 MG tablet Take 1 tablet (25 mg total) by mouth every 6 (six) hours.  10/13/14   Nelva Nay, MD  ibuprofen (ADVIL,MOTRIN) 600 MG tablet Take 1 tablet (600 mg total) by mouth every 8 (eight) hours as needed. Patient taking differently: Take 600 mg by mouth every 8 (eight) hours as needed for headache or moderate pain.  06/30/14   Azalia Bilis, MD  Insulin Glargine (LANTUS) 100 UNIT/ML Solostar Pen Inject 35 Units into the skin at bedtime.    Historical Provider, MD  lisinopril-hydrochlorothiazide (PRINZIDE,ZESTORETIC) 20-12.5 MG per tablet Take 1 tablet by mouth daily.    Historical Provider, MD  metFORMIN (GLUCOPHAGE) 1000 MG tablet Take 1,000 mg by mouth 2 (two) times daily.    Historical Provider, MD  Multiple Vitamin (MULTIVITAMIN WITH MINERALS) TABS tablet Take 1 tablet by mouth daily.    Historical Provider, MD  Multiple Vitamins-Minerals (HAIR/SKIN/NAILS/BIOTIN) TABS Take 5 tablets by mouth daily.    Historical Provider, MD  traMADol (ULTRAM) 50 MG tablet Take 1 tablet (50 mg total) by mouth every 6 (six) hours as needed. 07/06/14   Rolan Bucco, MD   BP 170/83 mmHg  Pulse 94  Temp(Src) 98.7 F (37.1 C) (Oral)  Resp 16  SpO2 97%  LMP 08/02/2010 Physical Exam Physical Exam  Nursing note and vitals reviewed. Constitutional: She is  oriented to person, place, and time. She appears well-developed and well-nourished. No distress.  HENT:  Head: Normocephalic and atraumatic.  Eyes: Pupils are equal, round, and reactive to light.  Neck: Normal range of motion.  Cardiovascular: Normal rate and intact distal pulses.   Pulmonary/Chest: No respiratory distress.  mild expiratory wheezing noted to auscultation. Abdominal: Normal appearance. She exhibits no distension.  Musculoskeletal: Normal range of motion.  Neurological: She is alert and oriented to person, place, and time. No cranial nerve deficit.  Skin: Skin is warm and dry. No rash noted.  Psychiatric: She has a normal mood and affect. Her behavior is normal.   ED Course  Procedures (including critical  care time) Medications  albuterol (PROVENTIL HFA;VENTOLIN HFA) 108 (90 BASE) MCG/ACT inhaler 2 puff (not administered)       MDM   Final diagnoses:  Bronchitis        Nelva Nay, MD 10/13/14 1420

## 2014-10-13 NOTE — ED Notes (Signed)
Pt comes in with chest congestion starting 2 days ago, pt has some chest pain that accompanies her cough. Pt NAD noted. No respiratory distress noted.

## 2014-10-13 NOTE — Discharge Instructions (Signed)
Upper Respiratory Infection, Adult An upper respiratory infection (URI) is also sometimes known as the common cold. The upper respiratory tract includes the nose, sinuses, throat, trachea, and bronchi. Bronchi are the airways leading to the lungs. Most people improve within 1 week, but symptoms can last up to 2 weeks. A residual cough may last even longer.  CAUSES Many different viruses can infect the tissues lining the upper respiratory tract. The tissues become irritated and inflamed and often become very moist. Mucus production is also common. A cold is contagious. You can easily spread the virus to others by oral contact. This includes kissing, sharing a glass, coughing, or sneezing. Touching your mouth or nose and then touching a surface, which is then touched by another person, can also spread the virus. SYMPTOMS  Symptoms typically develop 1 to 3 days after you come in contact with a cold virus. Symptoms vary from person to person. They may include:  Runny nose.  Sneezing.  Nasal congestion.  Sinus irritation.  Sore throat.  Loss of voice (laryngitis).  Cough.  Fatigue.  Muscle aches.  Loss of appetite.  Headache.  Low-grade fever. DIAGNOSIS  You might diagnose your own cold based on familiar symptoms, since most people get a cold 2 to 3 times a year. Your caregiver can confirm this based on your exam. Most importantly, your caregiver can check that your symptoms are not due to another disease such as strep throat, sinusitis, pneumonia, asthma, or epiglottitis. Blood tests, throat tests, and X-rays are not necessary to diagnose a common cold, but they may sometimes be helpful in excluding other more serious diseases. Your caregiver will decide if any further tests are required. RISKS AND COMPLICATIONS  You may be at risk for a more severe case of the common cold if you smoke cigarettes, have chronic heart disease (such as heart failure) or lung disease (such as asthma), or if  you have a weakened immune system. The very young and very old are also at risk for more serious infections. Bacterial sinusitis, middle ear infections, and bacterial pneumonia can complicate the common cold. The common cold can worsen asthma and chronic obstructive pulmonary disease (COPD). Sometimes, these complications can require emergency medical care and may be life-threatening. PREVENTION  The best way to protect against getting a cold is to practice good hygiene. Avoid oral or hand contact with people with cold symptoms. Wash your hands often if contact occurs. There is no clear evidence that vitamin C, vitamin E, echinacea, or exercise reduces the chance of developing a cold. However, it is always recommended to get plenty of rest and practice good nutrition. TREATMENT  Treatment is directed at relieving symptoms. There is no cure. Antibiotics are not effective, because the infection is caused by a virus, not by bacteria. Treatment may include:  Increased fluid intake. Sports drinks offer valuable electrolytes, sugars, and fluids.  Breathing heated mist or steam (vaporizer or shower).  Eating chicken soup or other clear broths, and maintaining good nutrition.  Getting plenty of rest.  Using gargles or lozenges for comfort.  Controlling fevers with ibuprofen or acetaminophen as directed by your caregiver.  Increasing usage of your inhaler if you have asthma. Zinc gel and zinc lozenges, taken in the first 24 hours of the common cold, can shorten the duration and lessen the severity of symptoms. Pain medicines may help with fever, muscle aches, and throat pain. A variety of non-prescription medicines are available to treat congestion and runny nose. Your caregiver   can make recommendations and may suggest nasal or lung inhalers for other symptoms.  HOME CARE INSTRUCTIONS   Only take over-the-counter or prescription medicines for pain, discomfort, or fever as directed by your  caregiver.  Use a warm mist humidifier or inhale steam from a shower to increase air moisture. This may keep secretions moist and make it easier to breathe.  Drink enough water and fluids to keep your urine clear or pale yellow.  Rest as needed.  Return to work when your temperature has returned to normal or as your caregiver advises. You may need to stay home longer to avoid infecting others. You can also use a face mask and careful hand washing to prevent spread of the virus. SEEK MEDICAL CARE IF:   After the first few days, you feel you are getting worse rather than better.  You need your caregiver's advice about medicines to control symptoms.  You develop chills, worsening shortness of breath, or brown or red sputum. These may be signs of pneumonia.  You develop yellow or brown nasal discharge or pain in the face, especially when you bend forward. These may be signs of sinusitis.  You develop a fever, swollen neck glands, pain with swallowing, or white areas in the back of your throat. These may be signs of strep throat. SEEK IMMEDIATE MEDICAL CARE IF:   You have a fever.  You develop severe or persistent headache, ear pain, sinus pain, or chest pain.  You develop wheezing, a prolonged cough, cough up blood, or have a change in your usual mucus (if you have chronic lung disease).  You develop sore muscles or a stiff neck. Document Released: 07/03/2000 Document Revised: 04/01/2011 Document Reviewed: 04/14/2013 Exit[employer] Patient Information 2015 [employer]. This information is not intended to replace advice given to you by your health care provider. Make sure you discuss any questions you have with your health care provider.  

## 2014-10-13 NOTE — ED Notes (Signed)
Resp Tech called for inhaler education.

## 2014-12-20 ENCOUNTER — Emergency Department (HOSPITAL_COMMUNITY)
Admission: EM | Admit: 2014-12-20 | Discharge: 2014-12-20 | Disposition: A | Discharge status: Home / Self Care | Payer: Self-pay | Attending: Emergency Medicine | Admitting: Emergency Medicine

## 2014-12-20 ENCOUNTER — Encounter (HOSPITAL_COMMUNITY): Payer: Self-pay | Admitting: Emergency Medicine

## 2014-12-20 DIAGNOSIS — Z91040 Latex allergy status: Secondary | ICD-10-CM | POA: Insufficient documentation

## 2014-12-20 DIAGNOSIS — Z79899 Other long term (current) drug therapy: Secondary | ICD-10-CM | POA: Insufficient documentation

## 2014-12-20 DIAGNOSIS — F17210 Nicotine dependence, cigarettes, uncomplicated: Secondary | ICD-10-CM | POA: Insufficient documentation

## 2014-12-20 DIAGNOSIS — L908 Other atrophic disorders of skin: Secondary | ICD-10-CM | POA: Insufficient documentation

## 2014-12-20 DIAGNOSIS — Z9114 Patient's other noncompliance with medication regimen: Secondary | ICD-10-CM | POA: Insufficient documentation

## 2014-12-20 DIAGNOSIS — Z8739 Personal history of other diseases of the musculoskeletal system and connective tissue: Secondary | ICD-10-CM | POA: Insufficient documentation

## 2014-12-20 DIAGNOSIS — Z794 Long term (current) use of insulin: Secondary | ICD-10-CM | POA: Insufficient documentation

## 2014-12-20 DIAGNOSIS — I10 Essential (primary) hypertension: Secondary | ICD-10-CM | POA: Insufficient documentation

## 2014-12-20 DIAGNOSIS — Z792 Long term (current) use of antibiotics: Secondary | ICD-10-CM | POA: Insufficient documentation

## 2014-12-20 DIAGNOSIS — R110 Nausea: Secondary | ICD-10-CM | POA: Insufficient documentation

## 2014-12-20 DIAGNOSIS — I509 Heart failure, unspecified: Secondary | ICD-10-CM | POA: Insufficient documentation

## 2014-12-20 DIAGNOSIS — J441 Chronic obstructive pulmonary disease with (acute) exacerbation: Secondary | ICD-10-CM | POA: Insufficient documentation

## 2014-12-20 DIAGNOSIS — Z7984 Long term (current) use of oral hypoglycemic drugs: Secondary | ICD-10-CM | POA: Insufficient documentation

## 2014-12-20 DIAGNOSIS — E119 Type 2 diabetes mellitus without complications: Secondary | ICD-10-CM | POA: Insufficient documentation

## 2014-12-20 DIAGNOSIS — Z9889 Other specified postprocedural states: Secondary | ICD-10-CM | POA: Insufficient documentation

## 2014-12-20 MED ORDER — DEXAMETHASONE SODIUM PHOSPHATE 4 MG/ML IJ SOLN
8.0000 mg | Freq: Once | INTRAMUSCULAR | Status: AC
  Administered 2014-12-20: 8 mg via INTRAMUSCULAR
  Filled 2014-12-20: qty 2

## 2014-12-20 MED ORDER — TRIAMCINOLONE ACETONIDE 0.1 % EX CREA: 1.0000 "application " | TOPICAL_CREAM | Freq: Two times a day (BID) | CUTANEOUS | Status: DC

## 2014-12-20 MED ORDER — HYDROXYZINE PAMOATE 25 MG PO CAPS: 25.0000 mg | ORAL_CAPSULE | Freq: Three times a day (TID) | ORAL | Status: DC | PRN

## 2014-12-20 MED ORDER — HYDROXYZINE HCL 25 MG PO TABS
50.0000 mg | ORAL_TABLET | Freq: Once | ORAL | Status: AC
  Administered 2014-12-20: 50 mg via ORAL
  Filled 2014-12-20: qty 2

## 2014-12-20 MED ORDER — DEXAMETHASONE 4 MG PO TABS: 4.0000 mg | ORAL_TABLET | Freq: Two times a day (BID) | ORAL | Status: DC

## 2014-12-20 NOTE — Discharge Instructions (Signed)
Use medications as suggested. Vistaril may cause drowsiness, use with caution.

## 2014-12-20 NOTE — ED Notes (Addendum)
Pt c/o of an generalized, raised, "itchy" rash x 2 months. Pt states she woke up this morning and it was worse. Denies SOB. Denies new medications, new foods, or changes in detergent.

## 2014-12-20 NOTE — ED Provider Notes (Signed)
CSN: [id number]     Arrival date & time 12/20/14  1645 History   First MD Initiated Contact with Patient 12/20/14 1726     Chief Complaint  Patient presents with  . Rash     (Consider location/radiation/quality/duration/timing/severity/associated sxs/prior Treatment) Patient is a 54 y.o. [name] presenting with rash. The history is provided by the patient.  Rash Location: full body except private areas. Quality: dryness, itchiness and redness   Quality: not blistering, not burning, not draining, not painful and not weeping   Severity:  Moderate Onset quality:  Gradual Duration:  2 months Timing:  Intermittent Progression:  Worsening Context: not food, not insect bite/sting, not medications and not plant contact   Relieved by:  Nothing Ineffective treatments:  Antihistamines and moisturizers Associated symptoms: nausea, shortness of breath and URI   Associated symptoms: no fatigue, no fever, no throat swelling, no tongue swelling and not wheezing     Past Medical History  Diagnosis Date  . CHF (congestive heart failure) (HCC)     minamal  . Cardiomyopathy   . Hypertension   . Diabetes mellitus   . Normal echocardiogram     LVEF 25-30% 03/27/2010  . COPD (chronic obstructive pulmonary disease) (HCC)   . Non compliance w medication regimen   . DDD (degenerative disc disease), lumbar    Past Surgical History  Procedure Laterality Date  . Cardiac catheterization    . Tubal ligation     Family History  Problem Relation Age of Onset  . Stroke Mother   . Lung cancer Father    Social History  Substance Use Topics  . Smoking status: Current Every Day Smoker -- 0.50 packs/day    Types: Cigarettes  . Smokeless tobacco: None  . Alcohol Use: No   OB History    Gravida Para Term Preterm AB TAB SAB Ectopic Multiple Living   7 5 5  2  2   5      Review of Systems  Constitutional: Negative for fever and fatigue.  Respiratory: Positive for cough and shortness of breath.  Negative for wheezing.   Gastrointestinal: Positive for nausea.  Skin: Positive for rash.  All other systems reviewed and are negative.     Allergies  Banana and Latex  Home Medications   Prior to Admission medications   Medication Sig Start Date End Date Taking? Authorizing Provider  acetaminophen (TYLENOL) 500 MG tablet Take 1,500 mg by mouth daily as needed for mild pain or headache.    Historical Provider, MD  cyclobenzaprine (FLEXERIL) 10 MG tablet Take 1 tablet (10 mg total) by mouth 3 (three) times daily as needed for muscle spasms. 06/30/14   Azalia Bilis, MD  doxycycline (VIBRAMYCIN) 100 MG capsule Take 1 capsule (100 mg total) by mouth 2 (two) times daily. 10/13/14   Nelva Nay, MD  hydrOXYzine (ATARAX/VISTARIL) 25 MG tablet Take 1 tablet (25 mg total) by mouth every 6 (six) hours. 10/13/14   Nelva Nay, MD  ibuprofen (ADVIL,MOTRIN) 600 MG tablet Take 1 tablet (600 mg total) by mouth every 8 (eight) hours as needed. Patient taking differently: Take 600 mg by mouth every 8 (eight) hours as needed for headache or moderate pain.  06/30/14   Azalia Bilis, MD  Insulin Glargine (LANTUS) 100 UNIT/ML Solostar Pen Inject 35 Units into the skin at bedtime.    Historical Provider, MD  lisinopril-hydrochlorothiazide (PRINZIDE,ZESTORETIC) 20-12.5 MG per tablet Take 1 tablet by mouth daily.    Historical Provider, MD  metFORMIN (GLUCOPHAGE) 1000  MG tablet Take 1,000 mg by mouth 2 (two) times daily.    Historical Provider, MD  Multiple Vitamin (MULTIVITAMIN WITH MINERALS) TABS tablet Take 1 tablet by mouth daily.    Historical Provider, MD  Multiple Vitamins-Minerals (HAIR/SKIN/NAILS/BIOTIN) TABS Take 5 tablets by mouth daily.    Historical Provider, MD  traMADol (ULTRAM) 50 MG tablet Take 1 tablet (50 mg total) by mouth every 6 (six) hours as needed. 07/06/14   Rolan Bucco, MD   BP 132/93 mmHg  Pulse 105  Temp(Src) 98.1 F (36.7 C) (Oral)  Resp 18  Ht  (1.499 m)  Wt 72.077 kg   BMI 32.08 kg/m2  SpO2 98%  LMP 08/02/2010 Physical Exam  Constitutional: She is oriented to person, place, and time. She appears well-developed and well-nourished.  Non-toxic appearance.  HENT:  Head: Normocephalic.  Right Ear: Tympanic membrane and external ear normal.  Left Ear: Tympanic membrane and external ear normal.  Eyes: EOM and lids are normal. Pupils are equal, round, and reactive to light.  Neck: Normal range of motion. Neck supple. Carotid bruit is not present.  Cardiovascular: Normal rate, regular rhythm, normal heart sounds, intact distal pulses and normal pulses.   Pulmonary/Chest: Breath sounds normal. No respiratory distress.  Abdominal: Soft. Bowel sounds are normal. There is no tenderness. There is no guarding.  Musculoskeletal: Normal range of motion.  Lymphadenopathy:       Head (right side): No submandibular adenopathy present.       Head (left side): No submandibular adenopathy present.    She has no cervical adenopathy.  Neurological: She is alert and oriented to person, place, and time. She has normal strength. No cranial nerve deficit or sensory deficit.  Skin: Skin is warm and dry.  Dry red rash on the back, abdomen, arms, and legs. There multiple patches of dry skin. Is no drainage or weeping noted. No red streaks noted.  Psychiatric: She has a normal mood and affect. Her speech is normal.  Nursing note and vitals reviewed.   ED Course  Procedures (including critical care time) Labs Review Labs Reviewed - No data to display  Imaging Review No results found. I have personally reviewed and evaluated these images and lab results as part of my medical decision-making.   EKG Interpretation None      MDM  Vital signs reviewed. This no anaphylactic signs on examination. The patient denies any new medications, foods, or dryer sheets, medications, or environments. 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 Patient will be treated with Vistaril,  Decadron, and triamcinolone.    Final diagnoses:  None    **I have reviewed nursing notes, vital signs, and all appropriate lab and imaging results for this patient.Ivery Quale, PA-C 12/20/14 1746  Vanetta Mulders, MD 12/22/14 [phone]

## 2015-01-08 ENCOUNTER — Emergency Department (HOSPITAL_COMMUNITY)
Admission: EM | Admit: 2015-01-08 | Discharge: 2015-01-08 | Disposition: A | Discharge status: Home / Self Care | Payer: Self-pay | Attending: Emergency Medicine | Admitting: Emergency Medicine

## 2015-01-08 ENCOUNTER — Encounter (HOSPITAL_COMMUNITY): Payer: Self-pay | Admitting: Emergency Medicine

## 2015-01-08 DIAGNOSIS — Z9889 Other specified postprocedural states: Secondary | ICD-10-CM | POA: Insufficient documentation

## 2015-01-08 DIAGNOSIS — J449 Chronic obstructive pulmonary disease, unspecified: Secondary | ICD-10-CM | POA: Insufficient documentation

## 2015-01-08 DIAGNOSIS — Z7951 Long term (current) use of inhaled steroids: Secondary | ICD-10-CM | POA: Insufficient documentation

## 2015-01-08 DIAGNOSIS — I509 Heart failure, unspecified: Secondary | ICD-10-CM | POA: Insufficient documentation

## 2015-01-08 DIAGNOSIS — Z7984 Long term (current) use of oral hypoglycemic drugs: Secondary | ICD-10-CM | POA: Insufficient documentation

## 2015-01-08 DIAGNOSIS — F17210 Nicotine dependence, cigarettes, uncomplicated: Secondary | ICD-10-CM | POA: Insufficient documentation

## 2015-01-08 DIAGNOSIS — Z8739 Personal history of other diseases of the musculoskeletal system and connective tissue: Secondary | ICD-10-CM | POA: Insufficient documentation

## 2015-01-08 DIAGNOSIS — R42 Dizziness and giddiness: Secondary | ICD-10-CM | POA: Insufficient documentation

## 2015-01-08 DIAGNOSIS — K0889 Other specified disorders of teeth and supporting structures: Secondary | ICD-10-CM | POA: Insufficient documentation

## 2015-01-08 DIAGNOSIS — E1165 Type 2 diabetes mellitus with hyperglycemia: Secondary | ICD-10-CM | POA: Insufficient documentation

## 2015-01-08 DIAGNOSIS — I10 Essential (primary) hypertension: Secondary | ICD-10-CM | POA: Insufficient documentation

## 2015-01-08 DIAGNOSIS — Z9114 Patient's other noncompliance with medication regimen: Secondary | ICD-10-CM

## 2015-01-08 DIAGNOSIS — Z794 Long term (current) use of insulin: Secondary | ICD-10-CM | POA: Insufficient documentation

## 2015-01-08 DIAGNOSIS — R531 Weakness: Secondary | ICD-10-CM | POA: Insufficient documentation

## 2015-01-08 DIAGNOSIS — R739 Hyperglycemia, unspecified: Secondary | ICD-10-CM

## 2015-01-08 DIAGNOSIS — Z79899 Other long term (current) drug therapy: Secondary | ICD-10-CM | POA: Insufficient documentation

## 2015-01-08 DIAGNOSIS — Z7952 Long term (current) use of systemic steroids: Secondary | ICD-10-CM | POA: Insufficient documentation

## 2015-01-08 DIAGNOSIS — Z91040 Latex allergy status: Secondary | ICD-10-CM | POA: Insufficient documentation

## 2015-01-08 DIAGNOSIS — Z9119 Patient's noncompliance with other medical treatment and regimen: Secondary | ICD-10-CM | POA: Insufficient documentation

## 2015-01-08 LAB — COMPREHENSIVE METABOLIC PANEL
ALBUMIN: 3.6 g/dL (ref 3.5–5.0)
ALT: 19 U/L (ref 14–54)
AST: 13 U/L — AB (ref 15–41)
Alkaline Phosphatase: 158 U/L — ABNORMAL HIGH (ref 38–126)
Anion gap: 8 (ref 5–15)
BILIRUBIN TOTAL: 0.6 mg/dL (ref 0.3–1.2)
BUN: 19 mg/dL (ref 6–20)
CHLORIDE: 96 mmol/L — AB (ref 101–111)
CO2: 28 mmol/L (ref 22–32)
CREATININE: 0.91 mg/dL (ref 0.44–1.00)
Calcium: 9.6 mg/dL (ref 8.9–10.3)
GFR calc Af Amer: >60 mL/min (ref >60)
GLUCOSE: 614 mg/dL — AB (ref 65–99)
Potassium: 3.9 mmol/L (ref 3.5–5.1)
Sodium: 132 mmol/L — ABNORMAL LOW (ref 135–145)
Total Protein: 6.8 g/dL (ref 6.5–8.1)

## 2015-01-08 LAB — URINALYSIS, ROUTINE W REFLEX MICROSCOPIC
BILIRUBIN URINE: NEGATIVE
HGB URINE DIPSTICK: NEGATIVE
Ketones, ur: NEGATIVE mg/dL
Leukocytes, UA: NEGATIVE
Nitrite: NEGATIVE
PH: 5.5 (ref 5.0–8.0)
Protein, ur: NEGATIVE mg/dL

## 2015-01-08 LAB — CBC WITH DIFFERENTIAL/PLATELET
BASOS ABS: 0 10*3/uL (ref 0.0–0.1)
Basophils Relative: 0 %
EOS PCT: 0 %
Eosinophils Absolute: 0 10*3/uL (ref 0.0–0.7)
HEMATOCRIT: 42.1 % (ref 36.0–46.0)
HEMOGLOBIN: 15 g/dL (ref 12.0–15.0)
Lymphocytes Relative: 38 %
Lymphs Abs: 4.4 10*3/uL — ABNORMAL HIGH (ref 0.7–4.0)
MCH: 31.7 pg (ref 26.0–34.0)
MCHC: 35.6 g/dL (ref 30.0–36.0)
MCV: 89 fL (ref 78.0–100.0)
MONOS PCT: 5 %
Monocytes Absolute: 0.6 10*3/uL (ref 0.1–1.0)
Neutro Abs: 6.5 10*3/uL (ref 1.7–7.7)
Neutrophils Relative %: 57 %
Platelets: 309 10*3/uL (ref 150–400)
RBC: 4.73 MIL/uL (ref 3.87–5.11)
RDW: 13.2 % (ref 11.5–15.5)
WBC: 11.5 10*3/uL — AB (ref 4.0–10.5)

## 2015-01-08 LAB — URINE MICROSCOPIC-ADD ON
Bacteria, UA: NONE SEEN
WBC, UA: NONE SEEN WBC/hpf (ref 0–5)

## 2015-01-08 LAB — CBG MONITORING, ED
GLUCOSE-CAPILLARY: 419 mg/dL — AB (ref 65–99)
GLUCOSE-CAPILLARY: 354 mg/dL — AB (ref 65–99)
Glucose-Capillary: >600 mg/dL (ref 65–99)

## 2015-01-08 MED ORDER — LISINOPRIL-HYDROCHLOROTHIAZIDE 20-12.5 MG PO TABS: 1.0000 | ORAL_TABLET | Freq: Every day | ORAL | Status: DC

## 2015-01-08 MED ORDER — INSULIN ASPART 100 UNIT/ML ~~LOC~~ SOLN
SUBCUTANEOUS | Status: AC
  Filled 2015-01-08: qty 1

## 2015-01-08 MED ORDER — INSULIN GLARGINE 100 UNIT/ML ~~LOC~~ SOLN: 35.0000 [IU] | Freq: Every day | SUBCUTANEOUS | Status: DC

## 2015-01-08 MED ORDER — SODIUM CHLORIDE 0.9 % IV BOLUS (SEPSIS): 1000.0000 mL | Freq: Once | INTRAVENOUS | Status: DC

## 2015-01-08 MED ORDER — SODIUM CHLORIDE 0.9 % IV SOLN
Freq: Once | INTRAVENOUS | Status: AC
  Administered 2015-01-08: 14:00:00 via INTRAVENOUS

## 2015-01-08 MED ORDER — METFORMIN HCL 1000 MG PO TABS: 1000.0000 mg | ORAL_TABLET | Freq: Two times a day (BID) | ORAL | Status: DC

## 2015-01-08 MED ORDER — PENICILLIN V POTASSIUM 500 MG PO TABS: 500.0000 mg | ORAL_TABLET | Freq: Four times a day (QID) | ORAL | Status: DC

## 2015-01-08 MED ORDER — INSULIN ASPART 100 UNIT/ML IV SOLN
10.0000 [IU] | Freq: Once | INTRAVENOUS | Status: AC
  Administered 2015-01-08: 10 [IU] via SUBCUTANEOUS

## 2015-01-08 MED ORDER — FLUCONAZOLE 100 MG PO TABS
100.0000 mg | ORAL_TABLET | Freq: Once | ORAL | Status: AC
  Administered 2015-01-08: 100 mg via ORAL
  Filled 2015-01-08: qty 1

## 2015-01-08 MED ORDER — SODIUM CHLORIDE 0.9 % IV BOLUS (SEPSIS)
500.0000 mL | Freq: Once | INTRAVENOUS | Status: AC
  Administered 2015-01-08: 500 mL via INTRAVENOUS

## 2015-01-08 NOTE — ED Provider Notes (Signed)
CSN: [id number]     Arrival date & time 01/08/15  1223 History   First MD Initiated Contact with Patient 01/08/15 1343     Chief Complaint  Patient presents with  . Hyperglycemia     Patient is a 54 y.o. female presenting with hyperglycemia. The history is provided by the patient.  Hyperglycemia Severity:  Moderate Onset quality:  Gradual Duration:  2 weeks Timing:  Constant Progression:  Worsening Chronicity:  Recurrent Diabetes status:  Controlled with insulin Relieved by:  Nothing Ineffective treatments:  None tried Associated symptoms: blurred vision, dizziness, polyuria and weakness   Associated symptoms: no abdominal pain, no chest pain, no dysuria, no fever, no syncope and no vomiting   Pt reports multiple complaints - difficulty controlling glucose, polyuria, dental pain, dizziness She reports feeling very thirsty She reports she only has some of her DM meds but not all and she is unable to see her PCP due to transportation issues   Past Medical History  Diagnosis Date  . CHF (congestive heart failure) (HCC)     minamal  . Cardiomyopathy   . Hypertension   . Diabetes mellitus   . Normal echocardiogram     LVEF 25-30% 03/27/2010  . COPD (chronic obstructive pulmonary disease) (HCC)   . Non compliance w medication regimen   . DDD (degenerative disc disease), lumbar    Past Surgical History  Procedure Laterality Date  . Cardiac catheterization    . Tubal ligation     Family History  Problem Relation Age of Onset  . Stroke Mother   . Lung cancer Father    Social History  Substance Use Topics  . Smoking status: Current Every Day Smoker -- 0.50 packs/day    Types: Cigarettes  . Smokeless tobacco: Never Used  . Alcohol Use: No   OB History    Gravida Para Term Preterm AB TAB SAB Ectopic Multiple Living   7 5 5  2  2   5      Review of Systems  Constitutional: Negative for fever.  Eyes: Positive for blurred vision.  Cardiovascular: Negative for chest pain  and syncope.  Gastrointestinal: Negative for vomiting and abdominal pain.  Endocrine: Positive for polyuria.  Genitourinary: Negative for dysuria.  Neurological: Positive for dizziness and weakness.  All other systems reviewed and are negative.     Allergies  Banana and Latex  Home Medications   Prior to Admission medications   Medication Sig Start Date End Date Taking? Authorizing Provider  acetaminophen (TYLENOL) 500 MG tablet Take 1,500 mg by mouth daily as needed for mild pain or headache.   Yes Historical Provider, MD  cyclobenzaprine (FLEXERIL) 10 MG tablet Take 1 tablet (10 mg total) by mouth 3 (three) times daily as needed for muscle spasms. 06/30/14  Yes Azalia Bilis, MD  dexamethasone (DECADRON) 4 MG tablet Take 1 tablet (4 mg total) by mouth 2 (two) times daily with a meal. 12/20/14  Yes Ivery Quale, PA-C  hydrOXYzine (VISTARIL) 25 MG capsule Take 1 capsule (25 mg total) by mouth 3 (three) times daily as needed. Patient taking differently: Take 25 mg by mouth 3 (three) times daily as needed for itching.  12/20/14  Yes Ivery Quale, PA-C  ibuprofen (ADVIL,MOTRIN) 600 MG tablet Take 1 tablet (600 mg total) by mouth every 8 (eight) hours as needed. Patient taking differently: Take 600 mg by mouth every 8 (eight) hours as needed for headache or moderate pain.  06/30/14  Yes Azalia Bilis, MD  Insulin Glargine (LANTUS) 100 UNIT/ML Solostar Pen Inject 35 Units into the skin at bedtime.   Yes Historical Provider, MD  lisinopril-hydrochlorothiazide (PRINZIDE,ZESTORETIC) 20-12.5 MG per tablet Take 1 tablet by mouth daily.   Yes Historical Provider, MD  metFORMIN (GLUCOPHAGE) 1000 MG tablet Take 1,000 mg by mouth 2 (two) times daily.   Yes Historical Provider, MD  Multiple Vitamin (MULTIVITAMIN WITH MINERALS) TABS tablet Take 1 tablet by mouth daily.   Yes Historical Provider, MD  Multiple Vitamins-Minerals (HAIR/SKIN/NAILS/BIOTIN) TABS Take 5 tablets by mouth daily.   Yes Historical  Provider, MD  traMADol (ULTRAM) 50 MG tablet Take 1 tablet (50 mg total) by mouth every 6 (six) hours as needed. Patient taking differently: Take 50 mg by mouth every 6 (six) hours as needed for moderate pain.  07/06/14  Yes Rolan Bucco, MD  triamcinolone cream (KENALOG) 0.1 % Apply 1 application topically 2 (two) times daily. 12/20/14  Yes Ivery Quale, PA-C  doxycycline (VIBRAMYCIN) 100 MG capsule Take 1 capsule (100 mg total) by mouth 2 (two) times daily. Patient not taking: Reported on 01/08/2015 10/13/14   Nelva Nay, MD   BP 140/94 mmHg  Pulse 96  Temp(Src) 98.1 F (36.7 C) (Oral)  Resp 18  Ht  (1.499 m)  Wt 69.219 kg  BMI 30.81 kg/m2  SpO2 100%  LMP 08/02/2010 Physical Exam CONSTITUTIONAL: Well developed/well nourished HEAD: Normocephalic/atraumatic EYES: EOMI/PERRL ENMT: Mucous membranes moist, poor dentition, decayed left lower molar but no abscess NECK: supple no meningeal signs SPINE/BACK:entire spine nontender CV: S1/S2 noted, no murmurs/rubs/gallops noted LUNGS: Lungs are clear to auscultation bilaterally, no apparent distress ABDOMEN: soft, nontender NEURO: Pt is awake/alert/appropriate, moves all extremitiesx4.  No facial droop.   EXTREMITIES: pulses normal/equal, full ROM SKIN: warm, color normal PSYCH: no abnormalities of mood noted, alert and oriented to situation  ED Course  Procedures  Medications  fluconazole (DIFLUCAN) tablet 100 mg (not administered)  0.9 %  sodium chloride infusion ( Intravenous Stopped 01/08/15 1509)  insulin aspart (novoLOG) injection 10 Units (10 Units Subcutaneous Given 01/08/15 1506)  sodium chloride 0.9 % bolus 500 mL (0 mLs Intravenous Stopped 01/08/15 1619)   2:24 PM Pt with hyperglycemia but not in DKA She is well appearing Will need glucose correction Will need home meds re-ordered 3:39 PM Pt feeling improved She is requesting additional IV fluids 5:03 PM Pt improved Glucose improved She is watching TV and  in no distress She wants to go home She requests meds for yeast infection She requests antibiotics for her teeth She requests refill on lantus/lisinopril-hctz/metformin Advised PCP followup Labs Review Labs Reviewed  CBC WITH DIFFERENTIAL/PLATELET - Abnormal; Notable for the following:    WBC 11.5 (*)    Lymphs Abs 4.4 (*)    All other components within normal limits  COMPREHENSIVE METABOLIC PANEL - Abnormal; Notable for the following:    Sodium 132 (*)    Chloride 96 (*)    Glucose, Bld 614 (*)    AST 13 (*)    Alkaline Phosphatase 158 (*)    All other components within normal limits  URINALYSIS, ROUTINE W REFLEX MICROSCOPIC (NOT AT [facility]) - Abnormal; Notable for the following:    Specific Gravity, Urine <1.005 (*)    Glucose, UA >1000 (*)    All other components within normal limits  URINE MICROSCOPIC-ADD ON - Abnormal; Notable for the following:    Squamous Epithelial / LPF 0-5 (*)    All other components within normal limits  CBG MONITORING,  ED - Abnormal; Notable for the following:    Glucose-Capillary >600 (*)    All other components within normal limits  CBG MONITORING, ED - Abnormal; Notable for the following:    Glucose-Capillary 419 (*)    All other components within normal limits  CBG MONITORING, ED - Abnormal; Notable for the following:    Glucose-Capillary 354 (*)    All other components within normal limits    I have personally reviewed and evaluated these lab results as part of my medical decision-making.    MDM   Final diagnoses:  Hyperglycemia  H/O medication noncompliance  Pain, dental    Nursing notes including past medical history and social history reviewed and considered in documentation Labs/vital reviewed myself and considered during evaluation     Zadie Rhine, MD 01/08/15 1704

## 2015-01-08 NOTE — ED Notes (Signed)
Patient has multiple complaints. Patient c/o abscess left lower wisdom tooth, yeast infection, increase thirst and urination, double vision, and dizziness. Per patient all symptoms x2 weeks and progressively getting worse. Per patient out of blood pressure medication, and metformin x2-3 months. Patient's blood sugar registering high on cbg machine in triage.

## 2015-01-08 NOTE — ED Notes (Signed)
CRITICAL VALUE ALERT  Critical value received:  Glucose 614  Date of notification:  01/08/2015  Time of notification: 1350  Critical value read back:Yes.    Nurse who received alert:  Malena Catholic  MD notified (1st page):  wickline  Time of first page:  1351   Time MD responded:  1351

## 2015-01-13 ENCOUNTER — Inpatient Hospital Stay (HOSPITAL_COMMUNITY)
Admission: EM | Admit: 2015-01-13 | Discharge: 2015-01-18 | DRG: 638 | Disposition: A | Discharge status: Home / Self Care | Payer: Self-pay | Attending: Internal Medicine | Admitting: Internal Medicine

## 2015-01-13 ENCOUNTER — Encounter (HOSPITAL_COMMUNITY): Payer: Self-pay | Admitting: Emergency Medicine

## 2015-01-13 DIAGNOSIS — R739 Hyperglycemia, unspecified: Secondary | ICD-10-CM | POA: Diagnosis present

## 2015-01-13 DIAGNOSIS — I428 Other cardiomyopathies: Secondary | ICD-10-CM

## 2015-01-13 DIAGNOSIS — E119 Type 2 diabetes mellitus without complications: Secondary | ICD-10-CM | POA: Diagnosis present

## 2015-01-13 DIAGNOSIS — Z823 Family history of stroke: Secondary | ICD-10-CM

## 2015-01-13 DIAGNOSIS — IMO0002 Reserved for concepts with insufficient information to code with codable children: Secondary | ICD-10-CM | POA: Diagnosis present

## 2015-01-13 DIAGNOSIS — L03312 Cellulitis of back [any part except buttock]: Secondary | ICD-10-CM | POA: Diagnosis present

## 2015-01-13 DIAGNOSIS — Z9114 Patient's other noncompliance with medication regimen: Secondary | ICD-10-CM

## 2015-01-13 DIAGNOSIS — E1165 Type 2 diabetes mellitus with hyperglycemia: Secondary | ICD-10-CM

## 2015-01-13 DIAGNOSIS — E876 Hypokalemia: Secondary | ICD-10-CM | POA: Diagnosis present

## 2015-01-13 DIAGNOSIS — L0291 Cutaneous abscess, unspecified: Secondary | ICD-10-CM

## 2015-01-13 DIAGNOSIS — Z801 Family history of malignant neoplasm of trachea, bronchus and lung: Secondary | ICD-10-CM

## 2015-01-13 DIAGNOSIS — E1065 Type 1 diabetes mellitus with hyperglycemia: Principal | ICD-10-CM | POA: Diagnosis present

## 2015-01-13 DIAGNOSIS — Z794 Long term (current) use of insulin: Secondary | ICD-10-CM

## 2015-01-13 DIAGNOSIS — J449 Chronic obstructive pulmonary disease, unspecified: Secondary | ICD-10-CM | POA: Diagnosis present

## 2015-01-13 DIAGNOSIS — I10 Essential (primary) hypertension: Secondary | ICD-10-CM | POA: Diagnosis present

## 2015-01-13 DIAGNOSIS — Z23 Encounter for immunization: Secondary | ICD-10-CM

## 2015-01-13 DIAGNOSIS — F17210 Nicotine dependence, cigarettes, uncomplicated: Secondary | ICD-10-CM | POA: Diagnosis present

## 2015-01-13 DIAGNOSIS — L02212 Cutaneous abscess of back [any part, except buttock]: Secondary | ICD-10-CM

## 2015-01-13 DIAGNOSIS — I429 Cardiomyopathy, unspecified: Secondary | ICD-10-CM | POA: Diagnosis present

## 2015-01-13 LAB — BASIC METABOLIC PANEL
Anion gap: 14 (ref 5–15)
BUN: 16 mg/dL (ref 6–20)
CHLORIDE: 87 mmol/L — AB (ref 101–111)
CO2: 24 mmol/L (ref 22–32)
CREATININE: 1.06 mg/dL — AB (ref 0.44–1.00)
Calcium: 9.3 mg/dL (ref 8.9–10.3)
GFR, EST NON AFRICAN AMERICAN: 59 mL/min — AB (ref >60)
Glucose, Bld: 703 mg/dL (ref 65–99)
POTASSIUM: 4.9 mmol/L (ref 3.5–5.1)
SODIUM: 125 mmol/L — AB (ref 135–145)

## 2015-01-13 LAB — CBC
HEMATOCRIT: 45 % (ref 36.0–46.0)
Hemoglobin: 15 g/dL (ref 12.0–15.0)
MCH: 30.2 pg (ref 26.0–34.0)
MCHC: 33.3 g/dL (ref 30.0–36.0)
MCV: 90.5 fL (ref 78.0–100.0)
PLATELETS: 266 10*3/uL (ref 150–400)
RBC: 4.97 MIL/uL (ref 3.87–5.11)
RDW: 13.6 % (ref 11.5–15.5)
WBC: 17.7 10*3/uL — AB (ref 4.0–10.5)

## 2015-01-13 LAB — URINALYSIS, ROUTINE W REFLEX MICROSCOPIC
Bilirubin Urine: NEGATIVE
Glucose, UA: >1000 mg/dL — AB
Hgb urine dipstick: NEGATIVE
KETONES UR: 15 mg/dL — AB
LEUKOCYTES UA: NEGATIVE
NITRITE: NEGATIVE
PROTEIN: NEGATIVE mg/dL
Specific Gravity, Urine: <1.005 — ABNORMAL LOW (ref 1.005–1.030)
pH: 5.5 (ref 5.0–8.0)

## 2015-01-13 LAB — URINE MICROSCOPIC-ADD ON
BACTERIA UA: NONE SEEN
WBC, UA: NONE SEEN WBC/hpf (ref 0–5)

## 2015-01-13 LAB — CBG MONITORING, ED
Glucose-Capillary: 564 mg/dL (ref 65–99)
Glucose-Capillary: 407 mg/dL — ABNORMAL HIGH (ref 65–99)

## 2015-01-13 LAB — GLUCOSE, CAPILLARY
GLUCOSE-CAPILLARY: 380 mg/dL — AB (ref 65–99)
GLUCOSE-CAPILLARY: 403 mg/dL — AB (ref 65–99)
GLUCOSE-CAPILLARY: 231 mg/dL — AB (ref 65–99)
Glucose-Capillary: 329 mg/dL — ABNORMAL HIGH (ref 65–99)
Glucose-Capillary: 286 mg/dL — ABNORMAL HIGH (ref 65–99)

## 2015-01-13 MED ORDER — HYDRALAZINE HCL 20 MG/ML IJ SOLN: 5.0000 mg | INTRAMUSCULAR | Status: DC | PRN

## 2015-01-13 MED ORDER — ENOXAPARIN SODIUM 40 MG/0.4ML ~~LOC~~ SOLN
40.0000 mg | SUBCUTANEOUS | Status: DC
  Administered 2015-01-13 – 2015-01-17 (×5): 40 mg via SUBCUTANEOUS
  Filled 2015-01-13 (×5): qty 0.4

## 2015-01-13 MED ORDER — DEXTROSE 50 % IV SOLN: 25.0000 mL | INTRAVENOUS | Status: DC | PRN

## 2015-01-13 MED ORDER — INFLUENZA VAC SPLIT QUAD 0.5 ML IM SUSY
0.5000 mL | PREFILLED_SYRINGE | INTRAMUSCULAR | Status: AC
  Administered 2015-01-14: 0.5 mL via INTRAMUSCULAR
  Filled 2015-01-13: qty 0.5

## 2015-01-13 MED ORDER — SODIUM CHLORIDE 0.9 % IV SOLN
INTRAVENOUS | Status: DC
  Administered 2015-01-13 – 2015-01-14 (×2): via INTRAVENOUS

## 2015-01-13 MED ORDER — SODIUM CHLORIDE 0.9 % IV SOLN: INTRAVENOUS | Status: DC

## 2015-01-13 MED ORDER — POVIDONE-IODINE 10 % EX SOLN
CUTANEOUS | Status: AC
Start: 2015-01-13 — End: 2015-01-14
  Filled 2015-01-13: qty 118

## 2015-01-13 MED ORDER — INSULIN REGULAR BOLUS VIA INFUSION
0.0000 [IU] | Freq: Three times a day (TID) | INTRAVENOUS | Status: DC
  Administered 2015-01-13: 1.5 [IU] via INTRAVENOUS
  Filled 2015-01-13: qty 10

## 2015-01-13 MED ORDER — SODIUM CHLORIDE 0.9 % IV SOLN
Freq: Once | INTRAVENOUS | Status: AC
  Administered 2015-01-13: 15:00:00 via INTRAVENOUS

## 2015-01-13 MED ORDER — TRAMADOL HCL 50 MG PO TABS
50.0000 mg | ORAL_TABLET | Freq: Four times a day (QID) | ORAL | Status: DC | PRN
  Administered 2015-01-13 – 2015-01-18 (×12): 50 mg via ORAL
  Filled 2015-01-13 (×12): qty 1

## 2015-01-13 MED ORDER — ONDANSETRON HCL 4 MG PO TABS: 4.0000 mg | ORAL_TABLET | Freq: Four times a day (QID) | ORAL | Status: DC | PRN

## 2015-01-13 MED ORDER — SODIUM CHLORIDE 0.9 % IV BOLUS (SEPSIS)
1000.0000 mL | Freq: Once | INTRAVENOUS | Status: AC
Start: 2015-01-13 — End: 2015-01-13
  Administered 2015-01-13: 1000 mL via INTRAVENOUS

## 2015-01-13 MED ORDER — VANCOMYCIN HCL 10 G IV SOLR
1500.0000 mg | Freq: Once | INTRAVENOUS | Status: AC
  Administered 2015-01-13: 1500 mg via INTRAVENOUS
  Filled 2015-01-13: qty 1500

## 2015-01-13 MED ORDER — DEXTROSE-NACL 5-0.45 % IV SOLN: INTRAVENOUS | Status: DC

## 2015-01-13 MED ORDER — PIPERACILLIN-TAZOBACTAM 3.375 G IVPB
3.3750 g | Freq: Three times a day (TID) | INTRAVENOUS | Status: DC
  Administered 2015-01-13 – 2015-01-18 (×15): 3.375 g via INTRAVENOUS
  Filled 2015-01-13 (×12): qty 50

## 2015-01-13 MED ORDER — ONDANSETRON HCL 4 MG/2ML IJ SOLN
4.0000 mg | Freq: Once | INTRAMUSCULAR | Status: AC
  Administered 2015-01-13: 4 mg via INTRAMUSCULAR
  Filled 2015-01-13: qty 2

## 2015-01-13 MED ORDER — ONDANSETRON HCL 4 MG/2ML IJ SOLN: 4.0000 mg | Freq: Four times a day (QID) | INTRAMUSCULAR | Status: DC | PRN

## 2015-01-13 MED ORDER — ENOXAPARIN SODIUM 40 MG/0.4ML ~~LOC~~ SOLN: 40.0000 mg | SUBCUTANEOUS | Status: DC

## 2015-01-13 MED ORDER — VANCOMYCIN HCL 10 G IV SOLR
1250.0000 mg | INTRAVENOUS | Status: DC
  Filled 2015-01-13 (×2): qty 1250

## 2015-01-13 MED ORDER — SODIUM CHLORIDE 0.9 % IJ SOLN
3.0000 mL | Freq: Two times a day (BID) | INTRAMUSCULAR | Status: DC
  Administered 2015-01-13 – 2015-01-18 (×7): 3 mL via INTRAVENOUS

## 2015-01-13 MED ORDER — SODIUM CHLORIDE 0.9 % IV SOLN
INTRAVENOUS | Status: DC
  Administered 2015-01-13: 5 [IU]/h via INTRAVENOUS
  Filled 2015-01-13: qty 2.5

## 2015-01-13 MED ORDER — HYDROMORPHONE HCL 1 MG/ML IJ SOLN
1.0000 mg | Freq: Once | INTRAMUSCULAR | Status: AC
  Administered 2015-01-13: 1 mg via INTRAVENOUS
  Filled 2015-01-13: qty 1

## 2015-01-13 MED ORDER — LIDOCAINE-EPINEPHRINE 1 %-1:200000 IJ SOLN
INTRAMUSCULAR | Status: AC
Start: 2015-01-13 — End: 2015-01-14
  Filled 2015-01-13: qty 10

## 2015-01-13 NOTE — ED Notes (Signed)
Pt c/o abscess to left mid back x 2 days. Pt also c/o "feeling bad" today. Pt states she has not been able to afford her metformin and lantus.

## 2015-01-13 NOTE — ED Provider Notes (Signed)
CSN: [id number]     Arrival date & time 01/13/15  1346 History   First MD Initiated Contact with Patient 01/13/15 1502     Chief Complaint  Patient presents with  . Hyperglycemia  . Abscess     (Consider location/radiation/quality/duration/timing/severity/associated sxs/prior Treatment) Patient is a 54 y.o. female presenting with hyperglycemia and abscess. The history is provided by the patient (The patient complains of vomiting last night and weakness she has not been on her diabetes medicine for quite a while).  Hyperglycemia Severity:  Severe Onset quality:  Gradual Timing:  Constant Progression:  Unable to specify Chronicity:  Recurrent Diabetes status:  Controlled with insulin Associated symptoms: vomiting   Associated symptoms: no abdominal pain, no chest pain and no fatigue   Abscess Associated symptoms: vomiting   Associated symptoms: no fatigue and no headaches     Past Medical History  Diagnosis Date  . CHF (congestive heart failure) (HCC)     minamal  . Cardiomyopathy   . Hypertension   . Diabetes mellitus   . Normal echocardiogram     LVEF 25-30% 03/27/2010  . COPD (chronic obstructive pulmonary disease) (HCC)   . Non compliance w medication regimen   . DDD (degenerative disc disease), lumbar    Past Surgical History  Procedure Laterality Date  . Cardiac catheterization    . Tubal ligation     Family History  Problem Relation Age of Onset  . Stroke Mother   . Lung cancer Father    Social History  Substance Use Topics  . Smoking status: Current Every Day Smoker -- 0.50 packs/day    Types: Cigarettes  . Smokeless tobacco: Never Used  . Alcohol Use: No   OB History    Gravida Para Term Preterm AB TAB SAB Ectopic Multiple Living   7 5 5  2  2   5      Review of Systems  Constitutional: Negative for appetite change and fatigue.  HENT: Negative for congestion, ear discharge and sinus pressure.   Eyes: Negative for discharge.  Respiratory: Negative  for cough.   Cardiovascular: Negative for chest pain.  Gastrointestinal: Positive for vomiting. Negative for abdominal pain and diarrhea.  Genitourinary: Negative for frequency and hematuria.  Musculoskeletal: Negative for back pain.  Skin: Positive for rash.  Neurological: Negative for seizures and headaches.  Psychiatric/Behavioral: Negative for hallucinations.      Allergies  Banana and Latex  Home Medications   Prior to Admission medications   Medication Sig Start Date End Date Taking? Authorizing Provider  acetaminophen (TYLENOL) 500 MG tablet Take 1,500 mg by mouth daily as needed for mild pain or headache.    Historical Provider, MD  cyclobenzaprine (FLEXERIL) 10 MG tablet Take 1 tablet (10 mg total) by mouth 3 (three) times daily as needed for muscle spasms. 06/30/14   Azalia Bilis, MD  dexamethasone (DECADRON) 4 MG tablet Take 1 tablet (4 mg total) by mouth 2 (two) times daily with a meal. 12/20/14   Ivery Quale, PA-C  hydrOXYzine (VISTARIL) 25 MG capsule Take 1 capsule (25 mg total) by mouth 3 (three) times daily as needed. Patient taking differently: Take 25 mg by mouth 3 (three) times daily as needed for itching.  12/20/14   Ivery Quale, PA-C  ibuprofen (ADVIL,MOTRIN) 600 MG tablet Take 1 tablet (600 mg total) by mouth every 8 (eight) hours as needed. Patient taking differently: Take 600 mg by mouth every 8 (eight) hours as needed for headache or moderate pain.  06/30/14   Azalia Bilis, MD  insulin glargine (LANTUS) 100 UNIT/ML injection Inject 0.35 mLs (35 Units total) into the skin at bedtime. 01/08/15   Zadie Rhine, MD  lisinopril-hydrochlorothiazide (ZESTORETIC) 20-12.5 MG tablet Take 1 tablet by mouth daily. 01/08/15   Zadie Rhine, MD  metFORMIN (GLUCOPHAGE) 1000 MG tablet Take 1 tablet (1,000 mg total) by mouth 2 (two) times daily. 01/08/15   Zadie Rhine, MD  Multiple Vitamin (MULTIVITAMIN WITH MINERALS) TABS tablet Take 1 tablet by mouth daily.     Historical Provider, MD  Multiple Vitamins-Minerals (HAIR/SKIN/NAILS/BIOTIN) TABS Take 5 tablets by mouth daily.    Historical Provider, MD  penicillin v potassium (VEETID) 500 MG tablet Take 1 tablet (500 mg total) by mouth 4 (four) times daily. 01/08/15 01/15/15  Zadie Rhine, MD  traMADol (ULTRAM) 50 MG tablet Take 1 tablet (50 mg total) by mouth every 6 (six) hours as needed. Patient taking differently: Take 50 mg by mouth every 6 (six) hours as needed for moderate pain.  07/06/14   Rolan Bucco, MD  triamcinolone cream (KENALOG) 0.1 % Apply 1 application topically 2 (two) times daily. 12/20/14   Ivery Quale, PA-C   BP 134/77 mmHg  Pulse 90  Temp(Src) 98.6 F (37 C) (Oral)  Resp 16  Ht  (1.499 m)  Wt 154 lb (69.854 kg)  BMI 31.09 kg/m2  SpO2 98%  LMP 08/02/2010 Physical Exam  Constitutional: She is oriented to person, place, and time. She appears well-developed.  HENT:  Head: Normocephalic.  Eyes: Conjunctivae and EOM are normal. No scleral icterus.  Neck: Neck supple. No thyromegaly present.  Cardiovascular: Normal rate and regular rhythm.  Exam reveals no gallop and no friction rub.   No murmur heard. Pulmonary/Chest: No stridor. She has no wheezes. She has no rales. She exhibits no tenderness.  Abdominal: She exhibits no distension. There is no tenderness. There is no rebound.  Musculoskeletal: Normal range of motion. She exhibits no edema.  Lymphadenopathy:    She has no cervical adenopathy.  Neurological: She is oriented to person, place, and time. She exhibits normal muscle tone. Coordination normal.  Skin: Rash noted. No erythema.  Patient has a moderate size abscess to the upper back on her left side  Psychiatric: She has a normal mood and affect. Her behavior is normal.    ED Course  .Marland KitchenIncision and Drainage Date/Time: 01/13/2015 4:48 PM Performed by: Bethann Berkshire Authorized by: Bethann Berkshire Comments: Patient had an abscess to left upper back that  was 3 cm x 3 cm.   Area was cleaned with Betadine 1% lidocaine with epi was used to numb the area. #11 blade was used to open the abscess. Moderate amount of pus was removed and iodoform medicated gauze was used to pack the area   (including critical care time) Labs Review Labs Reviewed  BASIC METABOLIC PANEL - Abnormal; Notable for the following:    Sodium 125 (*)    Chloride 87 (*)    Glucose, Bld 703 (*)    Creatinine, Ser 1.06 (*)    GFR calc non Af Amer 59 (*)    All other components within normal limits  CBC - Abnormal; Notable for the following:    WBC 17.7 (*)    All other components within normal limits  URINALYSIS, ROUTINE W REFLEX MICROSCOPIC (NOT AT [facility]) - Abnormal; Notable for the following:    Specific Gravity, Urine <1.005 (*)    Glucose, UA >1000 (*)    Ketones, ur  15 (*)    All other components within normal limits  URINE MICROSCOPIC-ADD ON - Abnormal; Notable for the following:    Squamous Epithelial / LPF 0-5 (*)    All other components within normal limits  CBG MONITORING, ED - Abnormal; Notable for the following:    Glucose-Capillary >600 (*)    All other components within normal limits  CBG MONITORING, ED - Abnormal; Notable for the following:    Glucose-Capillary 564 (*)    All other components within normal limits    Imaging Review No results found. I have personally reviewed and evaluated these images and lab results as part of my medical decision-making.   EKG Interpretation None     CRITICAL CARE Performed by: Yamilka Lopiccolo L Total critical care time: 35 minutes Critical care time was exclusive of separately billable procedures and treating other patients. Critical care was necessary to treat or prevent imminent or life-threatening deterioration. Critical care was time spent personally by me on the following activities: development of treatment plan with patient and/or surrogate as well as nursing, discussions with consultants, evaluation of  patient's response to treatment, examination of patient, obtaining history from patient or surrogate, ordering and performing treatments and interventions, ordering and review of laboratory studies, ordering and review of radiographic studies, pulse oximetry and re-evaluation of patient's condition.   MDM   Final diagnoses:  Acute hyperglycemia    Patient with poorly controlled diabetes she is put on insulin drip will be admitted in the stepdown unit    Bethann Berkshire, MD 01/13/15 1649

## 2015-01-13 NOTE — ED Notes (Signed)
CRITICAL VALUE ALERT  Critical value received:  Glucose 703  Date of notification:  01/13/15  Time of notification:  1558  Critical value read back:Yes.    Nurse who received alert:  Tilman Neat  MD notified (1st page):  Dr.Zammit  Time of first page:  1558  MD notified (2nd page):  Time of second page:  Responding MD:  Dr. Estell Harpin  Time MD responded:  [phone]

## 2015-01-13 NOTE — H&P (Signed)
[facility] History and Physical  Felicia [name]:[id number] DOB: 06-19-1960 DOA: 01/13/2015  Referring physician: ER PCP: Tylene Fantasia., PA-C   Chief Complaint: Hyperglycemia. Back abscess.  HPI: Felicia [name] is a 54 y.o. female  This is a 54 year old lady, diabetic, cardiomyopathy, who presents with episodes of vomiting last night and weakness. She has not taken her diabetic medications for some time because she cannot afford them. She denies any fever. She has developed a swelling on her back. Evaluation in the emergency room shows that that she had a skin abscess on the back which was incised and drained. Also her blood glucose is over 700 although thankfully she is not in DKA. She is now being admitted for further management. She denies fever.   Review of Systems:  Apart from symptoms above, all systems are negative.  Past Medical History  Diagnosis Date  . CHF (congestive heart failure) (HCC)     minamal  . Cardiomyopathy   . Hypertension   . Diabetes mellitus   . Normal echocardiogram     LVEF 25-30% 03/27/2010  . COPD (chronic obstructive pulmonary disease) (HCC)   . Non compliance w medication regimen   . DDD (degenerative disc disease), lumbar    Past Surgical History  Procedure Laterality Date  . Cardiac catheterization    . Tubal ligation     Social History:  reports that she has been smoking Cigarettes.  She has been smoking about 0.50 packs per day. She has never used smokeless tobacco. She reports that she does not drink alcohol or use illicit drugs.  Allergies  Allergen Reactions  . Banana Itching  . Latex Itching    Family History  Problem Relation Age of Onset  . Stroke Mother   . Lung cancer Father       Prior to Admission medications   Medication Sig Start Date End Date Taking? Authorizing Provider  acetaminophen (TYLENOL) 500 MG tablet Take 1,500 mg by mouth daily as needed for mild pain or headache.   Yes Historical  Provider, MD  ibuprofen (ADVIL,MOTRIN) 600 MG tablet Take 1 tablet (600 mg total) by mouth every 8 (eight) hours as needed. Patient taking differently: Take 600 mg by mouth every 8 (eight) hours as needed for headache or moderate pain.  06/30/14  Yes Azalia Bilis, MD  insulin glargine (LANTUS) 100 UNIT/ML injection Inject 0.35 mLs (35 Units total) into the skin at bedtime. 01/08/15  Yes Zadie Rhine, MD  Multiple Vitamin (MULTIVITAMIN WITH MINERALS) TABS tablet Take 1 tablet by mouth daily.   Yes Historical Provider, MD  traMADol (ULTRAM) 50 MG tablet Take 1 tablet (50 mg total) by mouth every 6 (six) hours as needed. Patient taking differently: Take 50 mg by mouth every 6 (six) hours as needed for moderate pain.  07/06/14  Yes Rolan Bucco, MD  cyclobenzaprine (FLEXERIL) 10 MG tablet Take 1 tablet (10 mg total) by mouth 3 (three) times daily as needed for muscle spasms. Patient not taking: Reported on 01/13/2015 06/30/14   Azalia Bilis, MD  dexamethasone (DECADRON) 4 MG tablet Take 1 tablet (4 mg total) by mouth 2 (two) times daily with a meal. Patient not taking: Reported on 01/13/2015 12/20/14   Ivery Quale, PA-C  hydrOXYzine (VISTARIL) 25 MG capsule Take 1 capsule (25 mg total) by mouth 3 (three) times daily as needed. Patient not taking: Reported on 01/13/2015 12/20/14   Ivery Quale, PA-C  lisinopril-hydrochlorothiazide (ZESTORETIC) 20-12.5 MG tablet Take 1 tablet by mouth daily.  01/08/15   Zadie Rhine, MD  metFORMIN (GLUCOPHAGE) 1000 MG tablet Take 1 tablet (1,000 mg total) by mouth 2 (two) times daily. 01/08/15   Zadie Rhine, MD  penicillin v potassium (VEETID) 500 MG tablet Take 1 tablet (500 mg total) by mouth 4 (four) times daily. 01/08/15 01/15/15  Zadie Rhine, MD  triamcinolone cream (KENALOG) 0.1 % Apply 1 application topically 2 (two) times daily. Patient not taking: Reported on 01/13/2015 12/20/14   Ivery Quale, PA-C   Physical Exam: Filed Vitals:   01/13/15 1413  01/13/15 1630  BP: 132/83 134/77  Pulse: 99 90  Temp: 98.7 F (37.1 C) 98.6 F (37 C)  TempSrc: Oral   Resp: 16 16  Height:  (1.499 m)   Weight: 69.854 kg (154 lb)   SpO2: 100% 98%    Wt Readings from Last 3 Encounters:  01/13/15 69.854 kg (154 lb)  01/08/15 69.219 kg (152 lb 9.6 oz)  12/20/14 72.077 kg (158 lb 14.4 oz)    General:  Appears calm and comfortable. She does not look septic or toxic. She is hemodynamically stable. She is not tachycardic at rest. She is somewhat clinically dehydrated. Eyes: PERRL, normal lids, irises & conjunctiva ENT: grossly normal hearing, lips & tongue Neck: no LAD, masses or thyromegaly Cardiovascular: RRR, no m/r/g. No LE edema. Telemetry: SR, no arrhythmias  Respiratory: CTA bilaterally, no w/r/r. Normal respiratory effort. Abdomen: soft, ntnd Skin: There is cellulitis surrounding an abscess in the left lower back area itches now been incised and drained. Musculoskeletal: grossly normal tone BUE/BLE Psychiatric: grossly normal mood and affect, speech fluent and appropriate Neurologic: grossly non-focal.          Labs on Admission:  Basic Metabolic Panel:  Recent Labs Lab 01/08/15 1306 01/13/15 1458  NA 132* 125*  K 3.9 4.9  CL 96* 87*  CO2 28 24  GLUCOSE 614* 703*  BUN 19 16  CREATININE 0.91 1.06*  CALCIUM 9.6 9.3   Liver Function Tests:  Recent Labs Lab 01/08/15 1306  AST 13*  ALT 19  ALKPHOS 158*  BILITOT 0.6  PROT 6.8  ALBUMIN 3.6   No results for input(s): LIPASE, AMYLASE in the last 168 hours. No results for input(s): AMMONIA in the last 168 hours. CBC:  Recent Labs Lab 01/08/15 1306 01/13/15 1458  WBC 11.5* 17.7*  NEUTROABS 6.5  --   HGB 15.0 15.0  HCT 42.1 45.0  MCV 89.0 90.5  PLT 309 266   Cardiac Enzymes: No results for input(s): CKTOTAL, CKMB, CKMBINDEX, TROPONINI in the last 168 hours.  BNP (last 3 results) No results for input(s): BNP in the last 8760 hours.  ProBNP (last 3  results) No results for input(s): PROBNP in the last 8760 hours.  CBG:  Recent Labs Lab 01/08/15 1252 01/08/15 1538 01/08/15 1621 01/13/15 1416 01/13/15 1544  GLUCAP >600* 419* 354* >600* 564*    Radiological Exams on Admission: No results found.    Assessment/Plan   1. Uncontrolled diabetes mellitus. Her blood glucose is 703 and sodium is 125. She will be treated with IV fluids and intravenous insulin. 2. Back skin abscess. She is status post incision and drainage. I will use intravenous antibiotics, broad-spectrum as she is diabetic. 3. Nonischemic cardiomyopathy. She is not clinically in heart failure. 4. Hypertension. Hold ACE inhibitor and monitor blood pressure closely. Intravenous hydralazine for hypertension.  She'll be admitted to the stepdown unit because she is on IV insulin. This can gradually be transitioned off once  sugars stabilized. Further recommendations will depend on patient's hospital progress.  Code Status: Full code.  DVT Prophylaxis: Lovenox.  Family Communication: I discussed the plan with the patient at the bedside.   Disposition Plan: Home when medically stable.   Time spent: 60 minutes.  Wilson Singer [facility] Pager [phone].

## 2015-01-13 NOTE — ED Notes (Addendum)
Pt reports an abscess to her LT lower back. Pt also states, "I think my sugar is up because I feel bad." Pt has not checked CBG. CBG >600 in triage.

## 2015-01-13 NOTE — Progress Notes (Signed)
ANTIBIOTIC CONSULT NOTE - INITIAL  Pharmacy Consult for Vancomycin & Zosyn Indication: cellulitis  Allergies  Allergen Reactions  . Banana Itching  . Latex Itching    Patient Measurements: Height: 4\' 11"  (149.9 cm) Weight: 154 lb (69.854 kg) IBW/kg (Calculated) : 43.2 Adjusted Body Weight:   Vital Signs: Temp: 98.6 F (37 C) (12/23 1630) Temp Source: Oral (12/23 1413) BP: 134/77 mmHg (12/23 1630) Pulse Rate: 90 (12/23 1630) Intake/Output from previous day:   Intake/Output from this shift:    Labs:  Recent Labs  01/13/15 1458  WBC 17.7*  HGB 15.0  PLT 266  CREATININE 1.06*   Estimated Creatinine Clearance: 52.8 mL/min (by C-G formula based on Cr of 1.06). No results for input(s): VANCOTROUGH, VANCOPEAK, VANCORANDOM, GENTTROUGH, GENTPEAK, GENTRANDOM, TOBRATROUGH, TOBRAPEAK, TOBRARND, AMIKACINPEAK, AMIKACINTROU, AMIKACIN in the last 72 hours.   Microbiology: No results found for this or any previous visit (from the past 720 hour(s)).  Medical History: Past Medical History  Diagnosis Date  . CHF (congestive heart failure) (HCC)     minamal  . Cardiomyopathy   . Hypertension   . Diabetes mellitus   . Normal echocardiogram     LVEF 25-30% 03/27/2010  . COPD (chronic obstructive pulmonary disease) (HCC)   . Non compliance w medication regimen   . DDD (degenerative disc disease), lumbar     Medications:   (Not in a hospital admission) Assessment: 54 yo female with swelling on her back. Evaluation in the emergency room shows that that she had a skin abscess on the back, incised and drained. Diabetic patient uncontrolled, insulin infusion started.  Goal of Therapy:  Vancomycin trough level 10-15 mcg/ml  Plan:  Vancomycin 1500 mg IV loading dose, then 1250 mg IV every 24 hours Zosyn 3.375 GM IV every 8 hours, each dose over 8 hours Vancomycin trough at steady state Monitor renal function Labs per protocol  Felicia [name], Felicia [name] 01/13/2015,5:17 PM

## 2015-01-14 LAB — COMPREHENSIVE METABOLIC PANEL
ALK PHOS: 97 U/L (ref 38–126)
ALT: 14 U/L (ref 14–54)
AST: 11 U/L — AB (ref 15–41)
Albumin: 2.8 g/dL — ABNORMAL LOW (ref 3.5–5.0)
Anion gap: 10 (ref 5–15)
BILIRUBIN TOTAL: 0.9 mg/dL (ref 0.3–1.2)
BUN: 10 mg/dL (ref 6–20)
CALCIUM: 8 mg/dL — AB (ref 8.9–10.3)
CHLORIDE: 102 mmol/L (ref 101–111)
CO2: 19 mmol/L — ABNORMAL LOW (ref 22–32)
CREATININE: 0.65 mg/dL (ref 0.44–1.00)
Glucose, Bld: 301 mg/dL — ABNORMAL HIGH (ref 65–99)
Potassium: 4.1 mmol/L (ref 3.5–5.1)
Sodium: 131 mmol/L — ABNORMAL LOW (ref 135–145)
TOTAL PROTEIN: 5.7 g/dL — AB (ref 6.5–8.1)

## 2015-01-14 LAB — CBC
HCT: 35.3 % — ABNORMAL LOW (ref 36.0–46.0)
Hemoglobin: 11.7 g/dL — ABNORMAL LOW (ref 12.0–15.0)
MCH: 30.3 pg (ref 26.0–34.0)
MCHC: 33.1 g/dL (ref 30.0–36.0)
MCV: 91.5 fL (ref 78.0–100.0)
PLATELETS: 247 10*3/uL (ref 150–400)
RBC: 3.86 MIL/uL — AB (ref 3.87–5.11)
RDW: 14 % (ref 11.5–15.5)
WBC: 17.4 10*3/uL — AB (ref 4.0–10.5)

## 2015-01-14 LAB — GLUCOSE, CAPILLARY
GLUCOSE-CAPILLARY: 150 mg/dL — AB (ref 65–99)
GLUCOSE-CAPILLARY: 221 mg/dL — AB (ref 65–99)
Glucose-Capillary: 298 mg/dL — ABNORMAL HIGH (ref 65–99)
Glucose-Capillary: 311 mg/dL — ABNORMAL HIGH (ref 65–99)

## 2015-01-14 LAB — MRSA PCR SCREENING: MRSA BY PCR: POSITIVE — AB

## 2015-01-14 MED ORDER — INSULIN ASPART 100 UNIT/ML ~~LOC~~ SOLN
0.0000 [IU] | Freq: Every day | SUBCUTANEOUS | Status: DC
Start: 2015-01-14 — End: 2015-01-18
  Administered 2015-01-14: 4 [IU] via SUBCUTANEOUS
  Administered 2015-01-15: 5 [IU] via SUBCUTANEOUS

## 2015-01-14 MED ORDER — INSULIN ASPART 100 UNIT/ML ~~LOC~~ SOLN
0.0000 [IU] | Freq: Three times a day (TID) | SUBCUTANEOUS | Status: DC
  Administered 2015-01-14: 8 [IU] via SUBCUTANEOUS
  Administered 2015-01-14 (×2): 5 [IU] via SUBCUTANEOUS
  Administered 2015-01-15: 8 [IU] via SUBCUTANEOUS
  Administered 2015-01-15: 3 [IU] via SUBCUTANEOUS
  Administered 2015-01-15: 5 [IU] via SUBCUTANEOUS
  Administered 2015-01-16 (×2): 8 [IU] via SUBCUTANEOUS
  Administered 2015-01-17: 5 [IU] via SUBCUTANEOUS
  Administered 2015-01-17: 3 [IU] via SUBCUTANEOUS
  Administered 2015-01-17: 5 [IU] via SUBCUTANEOUS
  Administered 2015-01-18: 3 [IU] via SUBCUTANEOUS
  Administered 2015-01-18: 2 [IU] via SUBCUTANEOUS

## 2015-01-14 MED ORDER — INSULIN GLARGINE 100 UNIT/ML ~~LOC~~ SOLN
30.0000 [IU] | SUBCUTANEOUS | Status: DC
  Administered 2015-01-14 – 2015-01-16 (×3): 30 [IU] via SUBCUTANEOUS
  Filled 2015-01-14 (×5): qty 0.3

## 2015-01-14 MED ORDER — VANCOMYCIN HCL IN DEXTROSE 1-5 GM/200ML-% IV SOLN
1000.0000 mg | Freq: Two times a day (BID) | INTRAVENOUS | Status: DC
  Administered 2015-01-14 – 2015-01-18 (×9): 1000 mg via INTRAVENOUS
  Filled 2015-01-14 (×9): qty 200

## 2015-01-14 MED ORDER — DIPHENHYDRAMINE HCL 25 MG PO CAPS
25.0000 mg | ORAL_CAPSULE | Freq: Four times a day (QID) | ORAL | Status: DC | PRN
  Administered 2015-01-14 – 2015-01-18 (×7): 25 mg via ORAL
  Filled 2015-01-14 (×7): qty 1

## 2015-01-14 MED ORDER — SODIUM CHLORIDE 0.9 % IV SOLN
INTRAVENOUS | Status: DC
  Administered 2015-01-14: 12:00:00 via INTRAVENOUS
  Administered 2015-01-15: 150 mL/h via INTRAVENOUS
  Administered 2015-01-15: 1000 mL via INTRAVENOUS
  Administered 2015-01-16: 150 mL/h via INTRAVENOUS
  Administered 2015-01-16 (×2): via INTRAVENOUS
  Administered 2015-01-17: 150 mL/h via INTRAVENOUS
  Administered 2015-01-17: 13:00:00 via INTRAVENOUS
  Administered 2015-01-17: 150 mL/h via INTRAVENOUS
  Administered 2015-01-18: 12:00:00 via INTRAVENOUS

## 2015-01-14 NOTE — Progress Notes (Signed)
PT IS ALERT AND ORIENTED. SKIN WARM AND DRY, PT ABLE TO GO TO BR PER  SELF. VITAL SIGHS STABLE. IV SITES PATENT. VOIDING CLEAR YELLOW URINE. TRANSFERRING TO ROOM 318 AS MED/SURG PT. TRANSFER REPORT GIVEN  TO Zannie Kehr RN.

## 2015-01-14 NOTE — Progress Notes (Signed)
ANTIBIOTIC CONSULT NOTE   Pharmacy Consult for Vancomycin & Zosyn Indication: cellulitis  Allergies  Allergen Reactions  . Banana Itching  . Latex Itching    Patient Measurements: Height:  (149.9 cm) Weight: 158 lb 11.7 oz (72 kg) IBW/kg (Calculated) : 43.2 Adjusted Body Weight:   Vital Signs: Temp: 97.8 F (36.6 C) (12/24 0743) Temp Source: Oral (12/24 0743) BP: 114/67 mmHg (12/24 0700) Pulse Rate: 90 (12/24 0700) Intake/Output from previous day: 12/23 0701 - 12/24 0700 In: 3382.5 [P.O.:480; I.V.:1302.5; IV Piggyback:1600] Out: 700 [Urine:700] Intake/Output from this shift:    Labs:  Recent Labs  01/13/15 1458 01/14/15 0435  WBC 17.7* 17.4*  HGB 15.0 11.7*  PLT 266 247  CREATININE 1.06* 0.65   Estimated Creatinine Clearance: 71 mL/min (by C-G formula based on Cr of 0.65). No results for input(s): VANCOTROUGH, VANCOPEAK, VANCORANDOM, GENTTROUGH, GENTPEAK, GENTRANDOM, TOBRATROUGH, TOBRAPEAK, TOBRARND, AMIKACINPEAK, AMIKACINTROU, AMIKACIN in the last 72 hours.   Microbiology: No results found for this or any previous visit (from the past 720 hour(s)).  Medical History: Past Medical History  Diagnosis Date  . CHF (congestive heart failure) (HCC)     minamal  . Cardiomyopathy   . Hypertension   . Diabetes mellitus   . Normal echocardiogram     LVEF 25-30% 03/27/2010  . COPD (chronic obstructive pulmonary disease) (HCC)   . Non compliance w medication regimen   . DDD (degenerative disc disease), lumbar     Medications:  Prescriptions prior to admission  Medication Sig Dispense Refill Last Dose  . acetaminophen (TYLENOL) 500 MG tablet Take 1,500 mg by mouth daily as needed for mild pain or headache.   Past Month at Unknown time  . ibuprofen (ADVIL,MOTRIN) 600 MG tablet Take 1 tablet (600 mg total) by mouth every 8 (eight) hours as needed. (Patient taking differently: Take 600 mg by mouth every 8 (eight) hours as needed for headache or moderate pain. )  15 tablet 0 Past Week at Unknown time  . insulin glargine (LANTUS) 100 UNIT/ML injection Inject 0.35 mLs (35 Units total) into the skin at bedtime. 10 mL 11 01/12/2015 at Unknown time  . Multiple Vitamin (MULTIVITAMIN WITH MINERALS) TABS tablet Take 1 tablet by mouth daily.   Past Week at Unknown time  . traMADol (ULTRAM) 50 MG tablet Take 1 tablet (50 mg total) by mouth every 6 (six) hours as needed. (Patient taking differently: Take 50 mg by mouth every 6 (six) hours as needed for moderate pain. ) 15 tablet 0 Past Month at Unknown time  . cyclobenzaprine (FLEXERIL) 10 MG tablet Take 1 tablet (10 mg total) by mouth 3 (three) times daily as needed for muscle spasms. (Patient not taking: Reported on 01/13/2015) 12 tablet 0 Past Month at Unknown time  . dexamethasone (DECADRON) 4 MG tablet Take 1 tablet (4 mg total) by mouth 2 (two) times daily with a meal. (Patient not taking: Reported on 01/13/2015) 12 tablet 0 01/07/2015 at Unknown time  . hydrOXYzine (VISTARIL) 25 MG capsule Take 1 capsule (25 mg total) by mouth 3 (three) times daily as needed. (Patient not taking: Reported on 01/13/2015) 30 capsule 0 01/07/2015 at Unknown time  . lisinopril-hydrochlorothiazide (ZESTORETIC) 20-12.5 MG tablet Take 1 tablet by mouth daily. 30 tablet 0   . metFORMIN (GLUCOPHAGE) 1000 MG tablet Take 1 tablet (1,000 mg total) by mouth 2 (two) times daily. 30 tablet 0   . penicillin v potassium (VEETID) 500 MG tablet Take 1 tablet (500 mg total) by  mouth 4 (four) times daily. 40 tablet 0   . triamcinolone cream (KENALOG) 0.1 % Apply 1 application topically 2 (two) times daily. (Patient not taking: Reported on 01/13/2015) 30 g 0 01/07/2015 at Unknown time   Assessment: 54 yo female with swelling on her back. Evaluation in the emergency room shows that that she had a skin abscess on the back, incised and drained. Diabetic patient uncontrolled, insulin infusion started. Renal function has improved, CrCl 71 ml/min  Goal of  Therapy:  Vancomycin trough level 10-15 mcg/ml  Plan:  Change Vancomycin to 1 GM IV every 12 hours Continue Zosyn 3.375 GM IV every 8 hours, each dose over 8 hours Vancomycin trough at steady state Monitor renal function Labs per protocol  Raquel James, Merriam Brandner Bennett 01/14/2015,8:27 AM

## 2015-01-14 NOTE — Progress Notes (Signed)
[facility] PROGRESS NOTE  Felicia [name] TIW:[id number] DOB: [birth year]    PCP:   Tylene Fantasia., PA-C   HPI: Felicia [name] is an 54 y.o. female admitted by Dr Karilyn Cota yesterday for severe hyperosmolar hyperglycemia with a skin abscess.  I and D was performed in the ER, and she was placed on IV Van and IV Zosyn, and admitted to the ICU for IV insulin drip.  It was stopped subsequently.  She uses 35 units of lantus and metformin, but she hadn't been taking them due to cost.  She is feeling better today, and is able to eat.   Rewiew of Systems:  Constitutional: Negative for malaise, fever and chills. No significant weight loss or weight gain Eyes: Negative for eye pain, redness and discharge, diplopia, visual changes, or flashes of light. ENMT: Negative for ear pain, hoarseness, nasal congestion, sinus pressure and sore throat. No headaches; tinnitus, drooling, or problem swallowing. Cardiovascular: Negative for chest pain, palpitations, diaphoresis, dyspnea and peripheral edema. ; No orthopnea, PND Respiratory: Negative for cough, hemoptysis, wheezing and stridor. No pleuritic chestpain. Gastrointestinal: Negative for nausea, vomiting, diarrhea, constipation, abdominal pain, melena, blood in stool, hematemesis, jaundice and rectal bleeding.    Genitourinary: Negative for frequency, dysuria, incontinence,flank pain and hematuria; Musculoskeletal: Negative for back pain and neck pain. Negative for swelling and trauma.;  Skin: . Negative for pruritus, rash, abrasions, bruising and skin lesion.; ulcerations Neuro: Negative for headache, lightheadedness and neck stiffness. Negative for weakness, altered level of consciousness , altered mental status, extremity weakness, burning feet, involuntary movement, seizure and syncope.  Psych: negative for anxiety, depression, insomnia, tearfulness, panic attacks, hallucinations, paranoia, suicidal or homicidal ideation    Past Medical  History  Diagnosis Date  . CHF (congestive heart failure) (HCC)     minamal  . Cardiomyopathy   . Hypertension   . Diabetes mellitus   . Normal echocardiogram     LVEF 25-30% 03/27/2010  . COPD (chronic obstructive pulmonary disease) (HCC)   . Non compliance w medication regimen   . DDD (degenerative disc disease), lumbar     Past Surgical History  Procedure Laterality Date  . Cardiac catheterization    . Tubal ligation      Medications:  HOME MEDS: Prior to Admission medications   Medication Sig Start Date End Date Taking? Authorizing Provider  acetaminophen (TYLENOL) 500 MG tablet Take 1,500 mg by mouth daily as needed for mild pain or headache.   Yes Historical Provider, MD  ibuprofen (ADVIL,MOTRIN) 600 MG tablet Take 1 tablet (600 mg total) by mouth every 8 (eight) hours as needed. Patient taking differently: Take 600 mg by mouth every 8 (eight) hours as needed for headache or moderate pain.  06/30/14  Yes Azalia Bilis, MD  insulin glargine (LANTUS) 100 UNIT/ML injection Inject 0.35 mLs (35 Units total) into the skin at bedtime. 01/08/15  Yes Zadie Rhine, MD  Multiple Vitamin (MULTIVITAMIN WITH MINERALS) TABS tablet Take 1 tablet by mouth daily.   Yes Historical Provider, MD  traMADol (ULTRAM) 50 MG tablet Take 1 tablet (50 mg total) by mouth every 6 (six) hours as needed. Patient taking differently: Take 50 mg by mouth every 6 (six) hours as needed for moderate pain.  07/06/14  Yes Rolan Bucco, MD  cyclobenzaprine (FLEXERIL) 10 MG tablet Take 1 tablet (10 mg total) by mouth 3 (three) times daily as needed for muscle spasms. Patient not taking: Reported on 01/13/2015 06/30/14   Azalia Bilis, MD  dexamethasone (DECADRON) 4  MG tablet Take 1 tablet (4 mg total) by mouth 2 (two) times daily with a meal. Patient not taking: Reported on 01/13/2015 12/20/14   Ivery Quale, PA-C  hydrOXYzine (VISTARIL) 25 MG capsule Take 1 capsule (25 mg total) by mouth 3 (three) times daily as  needed. Patient not taking: Reported on 01/13/2015 12/20/14   Ivery Quale, PA-C  lisinopril-hydrochlorothiazide (ZESTORETIC) 20-12.5 MG tablet Take 1 tablet by mouth daily. 01/08/15   Zadie Rhine, MD  metFORMIN (GLUCOPHAGE) 1000 MG tablet Take 1 tablet (1,000 mg total) by mouth 2 (two) times daily. 01/08/15   Zadie Rhine, MD  penicillin v potassium (VEETID) 500 MG tablet Take 1 tablet (500 mg total) by mouth 4 (four) times daily. 01/08/15 01/15/15  Zadie Rhine, MD  triamcinolone cream (KENALOG) 0.1 % Apply 1 application topically 2 (two) times daily. Patient not taking: Reported on 01/13/2015 12/20/14   Ivery Quale, PA-C     Allergies:  Allergies  Allergen Reactions  . Banana Itching  . Latex Itching    Social History:   reports that she has been smoking Cigarettes.  She has been smoking about 0.50 packs per day. She has never used smokeless tobacco. She reports that she does not drink alcohol or use illicit drugs.  Family History: Family History  Problem Relation Age of Onset  . Stroke Mother   . Lung cancer Father      Physical Exam: Filed Vitals:   01/14/15 0500 01/14/15 0600 01/14/15 0700 01/14/15 0743  BP: 130/81 126/71 114/67   Pulse: 99 92 90   Temp:  99 F (37.2 C)  97.8 F (36.6 C)  TempSrc:  Oral  Oral  Resp: Height:      Weight:  72 kg (158 lb 11.7 oz)    SpO2: 99% 99% 100%    Blood pressure 114/67, pulse 90, temperature 97.8 F (36.6 C), temperature source Oral, resp. rate 20, height  (1.499 m), weight 72 kg (158 lb 11.7 oz), last menstrual period 08/02/2010, SpO2 100 %.  GEN:  Pleasant patient lying in the stretcher in no acute distress; cooperative with exam. PSYCH:  alert and oriented x4; does not appear anxious or depressed; affect is appropriate. HEENT: Mucous membranes pink and anicteric; PERRLA; EOM intact; no cervical lymphadenopathy nor thyromegaly or carotid bruit; no JVD; There were no stridor. Neck is very  supple. Breasts:: Not examined CHEST WALL: No tenderness CHEST: Normal respiration, clear to auscultation bilaterally.  HEART: Regular rate and rhythm.  There are no murmur, rub, or gallops.   BACK: No kyphosis or scoliosis; no CVA tenderness ABDOMEN: soft and non-tender; no masses, no organomegaly, normal abdominal bowel sounds; no pannus; no intertriginous candida. There is no rebound and no distention. Rectal Exam: Not done EXTREMITIES: No bone or joint deformity; age-appropriate arthropathy of the hands and knees; no edema; no ulcerations.  There is no calf tenderness. Genitalia: not examined PULSES: 2+ and symmetric SKIN: Normal hydration no rash or ulceration CNS: Cranial nerves 2-12 grossly intact no focal lateralizing neurologic deficit.  Speech is fluent; uvula elevated with phonation, facial symmetry and tongue midline. DTR are normal bilaterally, cerebella exam is intact, barbinski is negative and strengths are equaled bilaterally.  No sensory loss.   Labs on Admission:  Basic Metabolic Panel:  Recent Labs Lab 01/08/15 1306 01/13/15 1458 01/14/15 0435  NA 132* 125* 131*  K 3.9 4.9 4.1  CL 96* 87* 102  CO2 28 24 19*  GLUCOSE 614*  703* 301*  BUN CREATININE 0.91 1.06* 0.65  CALCIUM 9.6 9.3 8.0*   Liver Function Tests:  Recent Labs Lab 01/08/15 1306 01/14/15 0435  AST 13* 11*  ALT 19 14  ALKPHOS 158* 97  BILITOT 0.6 0.9  PROT 6.8 5.7*  ALBUMIN 3.6 2.8*   CBC:  Recent Labs Lab 01/08/15 1306 01/13/15 1458 01/14/15 0435  WBC 11.5* 17.7* 17.4*  NEUTROABS 6.5  --   --   HGB 15.0 15.0 11.7*  HCT 42.1 45.0 35.3*  MCV 89.0 90.5 91.5  PLT 309 266 247    CBG:  Recent Labs Lab 01/13/15 2057 01/13/15 2155 01/13/15 2306 01/14/15 0020 01/14/15 0727  GLUCAP 403* 286* 231* 150* 298*    Assessment/Plan Present on Admission:  . Hyperglycemia . Diabetes mellitus type I (HCC) . Hypertension . Uncontrolled diabetes mellitus  (HCC)   Assessment/Plan   1. Uncontrolled diabetes mellitus. She needs more IVF.  WIll transition insulin to her Lantus.  Her BS is coming down nicely, but still quite elevated. 2. Back skin abscess. She is status post incision and drainage. I will use intravenous antibiotics, broad-spectrum as she is diabetic. 3. Nonischemic cardiomyopathy. She is not clinically in heart failure. 4. Hypertension. Hold ACE inhibitor and monitor blood pressure closely. Intravenous hydralazine for hypertension.   Code Status: Full code. DVT Prophylaxis: Lovenox. Family Communication: I discussed the plan with the patient at the bedside.     Houston Siren, MD.  FACP [facility] Pager [phone] 7pm to 7am.  01/14/2015, 8:33 AM

## 2015-01-15 ENCOUNTER — Encounter (HOSPITAL_COMMUNITY): Payer: Self-pay | Admitting: Radiology

## 2015-01-15 ENCOUNTER — Inpatient Hospital Stay (HOSPITAL_COMMUNITY): Payer: Self-pay

## 2015-01-15 DIAGNOSIS — I429 Cardiomyopathy, unspecified: Secondary | ICD-10-CM

## 2015-01-15 LAB — BASIC METABOLIC PANEL
Anion gap: 6 (ref 5–15)
BUN: 8 mg/dL (ref 6–20)
CALCIUM: 7.8 mg/dL — AB (ref 8.9–10.3)
CO2: 24 mmol/L (ref 22–32)
CREATININE: 0.63 mg/dL (ref 0.44–1.00)
Chloride: 103 mmol/L (ref 101–111)
GFR calc non Af Amer: >60 mL/min (ref >60)
GLUCOSE: 243 mg/dL — AB (ref 65–99)
Potassium: 3.5 mmol/L (ref 3.5–5.1)
Sodium: 133 mmol/L — ABNORMAL LOW (ref 135–145)

## 2015-01-15 LAB — CBC
HCT: 31.7 % — ABNORMAL LOW (ref 36.0–46.0)
Hemoglobin: 10.9 g/dL — ABNORMAL LOW (ref 12.0–15.0)
MCH: 31.5 pg (ref 26.0–34.0)
MCHC: 34.4 g/dL (ref 30.0–36.0)
MCV: 91.6 fL (ref 78.0–100.0)
PLATELETS: 218 10*3/uL (ref 150–400)
RBC: 3.46 MIL/uL — ABNORMAL LOW (ref 3.87–5.11)
RDW: 14 % (ref 11.5–15.5)
WBC: 10.7 10*3/uL — ABNORMAL HIGH (ref 4.0–10.5)

## 2015-01-15 LAB — GLUCOSE, CAPILLARY
GLUCOSE-CAPILLARY: 287 mg/dL — AB (ref 65–99)
Glucose-Capillary: 185 mg/dL — ABNORMAL HIGH (ref 65–99)
Glucose-Capillary: 249 mg/dL — ABNORMAL HIGH (ref 65–99)
Glucose-Capillary: 364 mg/dL — ABNORMAL HIGH (ref 65–99)

## 2015-01-15 MED ORDER — MUPIROCIN 2 % EX OINT
1.0000 "application " | TOPICAL_OINTMENT | Freq: Two times a day (BID) | CUTANEOUS | Status: DC
  Administered 2015-01-15 – 2015-01-18 (×7): 1 via NASAL
  Filled 2015-01-15: qty 22

## 2015-01-15 MED ORDER — CHLORHEXIDINE GLUCONATE CLOTH 2 % EX PADS
6.0000 | MEDICATED_PAD | Freq: Every day | CUTANEOUS | Status: DC
  Administered 2015-01-15 – 2015-01-18 (×4): 6 via TOPICAL

## 2015-01-15 MED ORDER — IOHEXOL 300 MG/ML  SOLN
100.0000 mL | Freq: Once | INTRAMUSCULAR | Status: AC | PRN
  Administered 2015-01-15: 100 mL via INTRAVENOUS

## 2015-01-15 MED ORDER — VANCOMYCIN HCL IN DEXTROSE 1-5 GM/200ML-% IV SOLN
INTRAVENOUS | Status: AC
  Filled 2015-01-15: qty 200

## 2015-01-15 NOTE — Progress Notes (Signed)
[facility] PROGRESS NOTE  Felicia [name]:[id number] DOB: 1960/09/05    PCP:   Tylene Fantasia., PA-C   HPI:    Felicia [name] is an 54 y.o. female admitted by Dr Karilyn Cota  for severe hyperosmolar hyperglycemia with a skin abscess. I and D was performed in the ER, and she was placed on IV Van and IV Zosyn, and admitted to the ICU for IV insulin drip. It was stopped subsequently. She uses 35 units of lantus and metformin, but she hadn't been taking them due to cost. She is feeling better today, and is able to eat. Her Insulin drip was subsequently stopped, and she was given Lantus with coverages.  Her CBG has been under contrlled, and her BS was 185 today.  She had some erythema on the sourrounding area, so a CT of her abdomen was done, showing no abscesses.  There was no culture sent, but her MRSA screening is positve.  Her WBC has clearly improved.   Rewiew of Systems:  Constitutional: Negative for malaise, fever and chills. No significant weight loss or weight gain Eyes: Negative for eye pain, redness and discharge, diplopia, visual changes, or flashes of light. ENMT: Negative for ear pain, hoarseness, nasal congestion, sinus pressure and sore throat. No headaches; tinnitus, drooling, or problem swallowing. Cardiovascular: Negative for chest pain, palpitations, diaphoresis, dyspnea and peripheral edema. ; No orthopnea, PND Respiratory: Negative for cough, hemoptysis, wheezing and stridor. No pleuritic chestpain. Gastrointestinal: Negative for nausea, vomiting, diarrhea, constipation, abdominal pain, melena, blood in stool, hematemesis, jaundice and rectal bleeding.    Genitourinary: Negative for frequency, dysuria, incontinence,flank pain and hematuria; Musculoskeletal: Negative for back pain and neck pain. Negative for swelling and trauma.;  Skin: . Negative for pruritus, rash, abrasions, bruising and skin lesion.; ulcerations Neuro: Negative for headache, lightheadedness  and neck stiffness. Negative for weakness, altered level of consciousness , altered mental status, extremity weakness, burning feet, involuntary movement, seizure and syncope.  Psych: negative for anxiety, depression, insomnia, tearfulness, panic attacks, hallucinations, paranoia, suicidal or homicidal ideation   Past Medical History  Diagnosis Date  . CHF (congestive heart failure) (HCC)     minamal  . Cardiomyopathy   . Hypertension   . Diabetes mellitus   . Normal echocardiogram     LVEF 25-30% 03/27/2010  . COPD (chronic obstructive pulmonary disease) (HCC)   . Non compliance w medication regimen   . DDD (degenerative disc disease), lumbar     Past Surgical History  Procedure Laterality Date  . Cardiac catheterization    . Tubal ligation      Medications:  HOME MEDS: Prior to Admission medications   Medication Sig Start Date End Date Taking? Authorizing Provider  acetaminophen (TYLENOL) 500 MG tablet Take 1,500 mg by mouth daily as needed for mild pain or headache.   Yes Historical Provider, MD  ibuprofen (ADVIL,MOTRIN) 600 MG tablet Take 1 tablet (600 mg total) by mouth every 8 (eight) hours as needed. Patient taking differently: Take 600 mg by mouth every 8 (eight) hours as needed for headache or moderate pain.  06/30/14  Yes Azalia Bilis, MD  insulin glargine (LANTUS) 100 UNIT/ML injection Inject 0.35 mLs (35 Units total) into the skin at bedtime. 01/08/15  Yes Zadie Rhine, MD  Multiple Vitamin (MULTIVITAMIN WITH MINERALS) TABS tablet Take 1 tablet by mouth daily.   Yes Historical Provider, MD  traMADol (ULTRAM) 50 MG tablet Take 1 tablet (50 mg total) by mouth every 6 (six) hours as needed. Patient  taking differently: Take 50 mg by mouth every 6 (six) hours as needed for moderate pain.  07/06/14  Yes Rolan Bucco, MD  cyclobenzaprine (FLEXERIL) 10 MG tablet Take 1 tablet (10 mg total) by mouth 3 (three) times daily as needed for muscle spasms. Patient not taking: Reported  on 01/13/2015 06/30/14   Azalia Bilis, MD  dexamethasone (DECADRON) 4 MG tablet Take 1 tablet (4 mg total) by mouth 2 (two) times daily with a meal. Patient not taking: Reported on 01/13/2015 12/20/14   Ivery Quale, PA-C  hydrOXYzine (VISTARIL) 25 MG capsule Take 1 capsule (25 mg total) by mouth 3 (three) times daily as needed. Patient not taking: Reported on 01/13/2015 12/20/14   Ivery Quale, PA-C  lisinopril-hydrochlorothiazide (ZESTORETIC) 20-12.5 MG tablet Take 1 tablet by mouth daily. 01/08/15   Zadie Rhine, MD  metFORMIN (GLUCOPHAGE) 1000 MG tablet Take 1 tablet (1,000 mg total) by mouth 2 (two) times daily. 01/08/15   Zadie Rhine, MD  penicillin v potassium (VEETID) 500 MG tablet Take 1 tablet (500 mg total) by mouth 4 (four) times daily. 01/08/15 01/15/15  Zadie Rhine, MD  triamcinolone cream (KENALOG) 0.1 % Apply 1 application topically 2 (two) times daily. Patient not taking: Reported on 01/13/2015 12/20/14   Ivery Quale, PA-C     Allergies:  Allergies  Allergen Reactions  . Banana Itching  . Latex Itching    Social History:   reports that she has been smoking Cigarettes.  She has been smoking about 0.50 packs per day. She has never used smokeless tobacco. She reports that she does not drink alcohol or use illicit drugs.  Family History: Family History  Problem Relation Age of Onset  . Stroke Mother   . Lung cancer Father      Physical Exam: Filed Vitals:   01/14/15 1142 01/14/15 1200 01/14/15 1300 01/15/15 0622  BP:   101/40 130/56  Pulse:  107 98 81  Temp: 98.3 F (36.8 C)   98.3 F (36.8 C)  TempSrc: Oral   Oral  Resp:  Height:      Weight:      SpO2:  100% 99% 99%   Blood pressure 130/56, pulse 81, temperature 98.3 F (36.8 C), temperature source Oral, resp. rate 20, height  (1.499 m), weight 72 kg (158 lb 11.7 oz), last menstrual period 08/02/2010, SpO2 99 %.  GEN:  Pleasant  patient lying in the stretcher in no acute  distress; cooperative with exam. PSYCH:  alert and oriented x4; does not appear anxious or depressed; affect is appropriate. HEENT: Mucous membranes pink and anicteric; PERRLA; EOM intact; no cervical lymphadenopathy nor thyromegaly or carotid bruit; no JVD; There were no stridor. Neck is very supple. Breasts:: Not examined CHEST WALL: No tenderness CHEST: Normal respiration, clear to auscultation bilaterally.  HEART: Regular rate and rhythm.  There are no murmur, rub, or gallops.   BACK: No kyphosis or scoliosis; no CVA tenderness ABDOMEN: soft and non-tender; no masses, no organomegaly, normal abdominal bowel sounds; no pannus; no intertriginous candida. There is no rebound and no distention. Rectal Exam: Not done EXTREMITIES: No bone or joint deformity; age-appropriate arthropathy of the hands and knees; no edema; no ulcerations.  There is no calf tenderness. Genitalia: not examined PULSES: 2+ and symmetric SKIN: Normal hydration no rash or ulceration.  There is sourrounding cellultis.  CNS: Cranial nerves 2-12 grossly intact no focal lateralizing neurologic deficit.  Speech is fluent; uvula elevated with phonation, facial symmetry and  tongue midline. DTR are normal bilaterally, cerebella exam is intact, barbinski is negative and strengths are equaled bilaterally.  No sensory loss.   Labs on Admission:  Basic Metabolic Panel:  Recent Labs Lab 01/13/15 1458 01/14/15 0435 01/15/15 0545  NA 125* 131* 133*  K 4.9 4.1 3.5  CL 87* 102 103  CO2 24 19* 24  GLUCOSE 703* 301* 243*  BUN 16 10 8   CREATININE 1.06* 0.65 0.63  CALCIUM 9.3 8.0* 7.8*   Liver Function Tests:  Recent Labs Lab 01/14/15 0435  AST 11*  ALT 14  ALKPHOS 97  BILITOT 0.9  PROT 5.7*  ALBUMIN 2.8*   CBC:  Recent Labs Lab 01/13/15 1458 01/14/15 0435 01/15/15 0545  WBC 17.7* 17.4* 10.7*  HGB 15.0 11.7* 10.9*  HCT 45.0 35.3* 31.7*  MCV 90.5 91.5 91.6  PLT 266 247 218   Cardiac Enzymes: No results for  input(s): CKTOTAL, CKMB, CKMBINDEX, TROPONINI in the last 168 hours.  CBG:  Recent Labs Lab 01/14/15 0727 01/14/15 1132 01/14/15 2206 01/15/15 0736 01/15/15 1159  GLUCAP 298* 221* 311* 287* 185*     Radiological Exams on Admission: Ct Abdomen W Contrast  01/15/2015  CLINICAL DATA:  Nausea, vomiting, and weakness since January 12, 2015. Patient has a history of abscess/tubal ligation that was lanced in the emergency room. EXAM: CT ABDOMEN WITH CONTRAST TECHNIQUE: Multidetector CT imaging of the abdomen was performed using the standard protocol following bolus administration of intravenous contrast. CONTRAST:  OMNIPAQUE IOHEXOL 300 MG/ML  SOLN COMPARISON:  None. FINDINGS: The liver, spleen, pancreas, gallbladder, adrenal glands and kidneys are normal. There is atherosclerosis of the abdominal aorta without aneurysmal dilatation. There is no abdominal lymphadenopathy. Visualized bowel is normal. No intra-abdominal abscess is noted. In the posterior left subcutaneous fat of the upper abdomen, there is mild stranding with small focus air, clinical correlation regarding recent site of abscess lancing is suggested. No focal discrete abscess is identified. Minimal scarring of the lung bases are noted. There is no focal pneumonia or pleural effusion. Mild degenerative joint changes of the spine are noted. IMPRESSION: No acute intra-abdominal abnormality identified. No intra-abdominal abscess noted. In the posterior left subcutaneous fat of the upper abdomen, there is mild stranding with small focus air, clinical correlation regarding recent site of abscess lancing is suggested. No focal discrete abscess is identified. Electronically Signed   By: Sherian Rein M.D.   On: 01/15/2015 13:11    Assessment/Plan Present on Admission:  . Hyperglycemia . Diabetes mellitus type I (HCC) . Hypertension . Uncontrolled diabetes mellitus (HCC)  PLAN: 1. Uncontrolled diabetes mellitus.  Doing well with  Lantus now.  Her BS is coming down nicely, but still quite elevated. 2. Back skin abscess. She is status post incision and drainage. I will use intravenous antibiotics, broad-spectrum as she is diabetic.  CT was done showing no surgical abscesses.  3. Nonischemic cardiomyopathy. She is not clinically in heart failure. Hypertension.BP is improving,  Cr is normal.   Will restart her lisinopril at 20mg  per day. D/C HCTZ.   Other plans as per orders. Code Status:  FULL CODE>   Houston Siren, MD.  FACP [facility] Pager [phone] 7pm to 7am.  01/15/2015, 2:35 PM

## 2015-01-16 LAB — BASIC METABOLIC PANEL
Anion gap: 5 (ref 5–15)
CHLORIDE: 105 mmol/L (ref 101–111)
CO2: 26 mmol/L (ref 22–32)
CREATININE: 0.64 mg/dL (ref 0.44–1.00)
Calcium: 7.8 mg/dL — ABNORMAL LOW (ref 8.9–10.3)
GFR calc Af Amer: >60 mL/min (ref >60)
GFR calc non Af Amer: >60 mL/min (ref >60)
Glucose, Bld: 129 mg/dL — ABNORMAL HIGH (ref 65–99)
POTASSIUM: 2.8 mmol/L — AB (ref 3.5–5.1)
Sodium: 136 mmol/L (ref 135–145)

## 2015-01-16 LAB — VANCOMYCIN, TROUGH: VANCOMYCIN TR: 24 ug/mL — AB (ref 10.0–20.0)

## 2015-01-16 LAB — GLUCOSE, CAPILLARY
GLUCOSE-CAPILLARY: 106 mg/dL — AB (ref 65–99)
GLUCOSE-CAPILLARY: 251 mg/dL — AB (ref 65–99)
Glucose-Capillary: 276 mg/dL — ABNORMAL HIGH (ref 65–99)
Glucose-Capillary: 156 mg/dL — ABNORMAL HIGH (ref 65–99)

## 2015-01-16 MED ORDER — INSULIN GLARGINE 100 UNIT/ML ~~LOC~~ SOLN
40.0000 [IU] | SUBCUTANEOUS | Status: DC
  Administered 2015-01-17: 40 [IU] via SUBCUTANEOUS
  Filled 2015-01-16 (×2): qty 0.4

## 2015-01-16 MED ORDER — INSULIN ASPART 100 UNIT/ML ~~LOC~~ SOLN
5.0000 [IU] | Freq: Three times a day (TID) | SUBCUTANEOUS | Status: DC
  Administered 2015-01-16 – 2015-01-17 (×3): 5 [IU] via SUBCUTANEOUS

## 2015-01-16 NOTE — Progress Notes (Signed)
[facility] PROGRESS NOTE  [name]:[id number] DOB: Nov 08, 1960    PCP:   Tylene Fantasia., PA-C   HPI: Felicia [name] is an 54 y.o. female admitted by Dr Karilyn Cota for severe hyperosmolar hyperglycemia with a skin abscess. I and D was performed in the ER, and she was placed on IV Van and IV Zosyn, and admitted to the ICU for IV insulin drip. It was stopped subsequently. She uses 35 units of lantus and metformin, but she hadn't been taking them due to cost. She is feeling better today, and is able to eat. Her Insulin drip was subsequently stopped, and she was given Lantus with coverages. Her CBG has been under contrlled, and her BS was 185 today. She had some erythema on the sourrounding area, so a CT of her abdomen was done, showing no abscesses. There was no culture sent, but her MRSA screening is positve. Her WBC has clearly improved.  There is increase purulent discharge, with an induration, and sourrounding cellulitis.   Rewiew of Systems:  Constitutional: Negative for malaise, fever and chills. No significant weight loss or weight gain Eyes: Negative for eye pain, redness and discharge, diplopia, visual changes, or flashes of light. ENMT: Negative for ear pain, hoarseness, nasal congestion, sinus pressure and sore throat. No headaches; tinnitus, drooling, or problem swallowing. Cardiovascular: Negative for chest pain, palpitations, diaphoresis, dyspnea and peripheral edema. ; No orthopnea, PND Respiratory: Negative for cough, hemoptysis, wheezing and stridor. No pleuritic chestpain. Gastrointestinal: Negative for nausea, vomiting, diarrhea, constipation, abdominal pain, melena, blood in stool, hematemesis, jaundice and rectal bleeding.    Genitourinary: Negative for frequency, dysuria, incontinence,flank pain and hematuria; Musculoskeletal: Negative for back pain and neck pain. Negative for swelling and trauma.;  Skin: . Negative for pruritus, rash, abrasions,  bruising and skin lesion.; ulcerations Neuro: Negative for headache, lightheadedness and neck stiffness. Negative for weakness, altered level of consciousness , altered mental status, extremity weakness, burning feet, involuntary movement, seizure and syncope.  Psych: negative for anxiety, depression, insomnia, tearfulness, panic attacks, hallucinations, paranoia, suicidal or homicidal ideation    Past Medical History  Diagnosis Date  . CHF (congestive heart failure) (HCC)     minamal  . Cardiomyopathy   . Hypertension   . Diabetes mellitus   . Normal echocardiogram     LVEF 25-30% 03/27/2010  . COPD (chronic obstructive pulmonary disease) (HCC)   . Non compliance w medication regimen   . DDD (degenerative disc disease), lumbar     Past Surgical History  Procedure Laterality Date  . Cardiac catheterization    . Tubal ligation      Medications:  HOME MEDS: Prior to Admission medications   Medication Sig Start Date End Date Taking? Authorizing Provider  acetaminophen (TYLENOL) 500 MG tablet Take 1,500 mg by mouth daily as needed for mild pain or headache.   Yes Historical Provider, MD  ibuprofen (ADVIL,MOTRIN) 600 MG tablet Take 1 tablet (600 mg total) by mouth every 8 (eight) hours as needed. Patient taking differently: Take 600 mg by mouth every 8 (eight) hours as needed for headache or moderate pain.  06/30/14  Yes Azalia Bilis, MD  insulin glargine (LANTUS) 100 UNIT/ML injection Inject 0.35 mLs (35 Units total) into the skin at bedtime. 01/08/15  Yes Zadie Rhine, MD  Multiple Vitamin (MULTIVITAMIN WITH MINERALS) TABS tablet Take 1 tablet by mouth daily.   Yes Historical Provider, MD  traMADol (ULTRAM) 50 MG tablet Take 1 tablet (50 mg total) by mouth every 6 (  six) hours as needed. Patient taking differently: Take 50 mg by mouth every 6 (six) hours as needed for moderate pain.  07/06/14  Yes Rolan Bucco, MD  cyclobenzaprine (FLEXERIL) 10 MG tablet Take 1 tablet (10 mg total) by  mouth 3 (three) times daily as needed for muscle spasms. Patient not taking: Reported on 01/13/2015 06/30/14   Azalia Bilis, MD  dexamethasone (DECADRON) 4 MG tablet Take 1 tablet (4 mg total) by mouth 2 (two) times daily with a meal. Patient not taking: Reported on 01/13/2015 12/20/14   Ivery Quale, PA-C  hydrOXYzine (VISTARIL) 25 MG capsule Take 1 capsule (25 mg total) by mouth 3 (three) times daily as needed. Patient not taking: Reported on 01/13/2015 12/20/14   Ivery Quale, PA-C  lisinopril-hydrochlorothiazide (ZESTORETIC) 20-12.5 MG tablet Take 1 tablet by mouth daily. 01/08/15   Zadie Rhine, MD  metFORMIN (GLUCOPHAGE) 1000 MG tablet Take 1 tablet (1,000 mg total) by mouth 2 (two) times daily. 01/08/15   Zadie Rhine, MD  triamcinolone cream (KENALOG) 0.1 % Apply 1 application topically 2 (two) times daily. Patient not taking: Reported on 01/13/2015 12/20/14   Ivery Quale, PA-C     Allergies:  Allergies  Allergen Reactions  . Banana Itching  . Latex Itching    Social History:   reports that she has been smoking Cigarettes.  She has been smoking about 0.50 packs per day. She has never used smokeless tobacco. She reports that she does not drink alcohol or use illicit drugs.  Family History: Family History  Problem Relation Age of Onset  . Stroke Mother   . Lung cancer Father      Physical Exam: Filed Vitals:   01/14/15 1300 01/15/15 0622 01/15/15 2152 01/16/15 0614  BP: 101/40 130/56 158/81 162/82  Pulse: 98 81 83 72  Temp:  98.3 F (36.8 C) 98.5 F (36.9 C) 98.9 F (37.2 C)  TempSrc:  Oral Oral Oral  Resp: Height:      Weight:      SpO2: 99% 99% 98% 99%   Blood pressure 162/82, pulse 72, temperature 98.9 F (37.2 C), temperature source Oral, resp. rate 20, height  (1.499 m), weight 72 kg (158 lb 11.7 oz), last menstrual period 08/02/2010, SpO2 99 %.  GEN:  Pleasant  patient lying in the stretcher in no acute distress; cooperative with  exam. PSYCH:  alert and oriented x4; does not appear anxious or depressed; affect is appropriate. HEENT: Mucous membranes pink and anicteric; PERRLA; EOM intact; no cervical lymphadenopathy nor thyromegaly or carotid bruit; no JVD; There were no stridor. Neck is very supple. Breasts:: Not examined CHEST WALL: No tenderness CHEST: Normal respiration, clear to auscultation bilaterally.  HEART: Regular rate and rhythm.  There are no murmur, rub, or gallops.   BACK: No kyphosis or scoliosis; no CVA tenderness ABDOMEN: soft and non-tender; no masses, no organomegaly, normal abdominal bowel sounds; no pannus; no intertriginous candida. There is no rebound and no distention. Rectal Exam: Not done EXTREMITIES: No bone or joint deformity; age-appropriate arthropathy of the hands and knees; no edema; no ulcerations.  There is no calf tenderness. Genitalia: not examined PULSES: 2+ and symmetric SKIN: Normal hydration no rash or ulceration.  She has purulent discharge, with sourrouding cellultis, and induration.  CNS: Cranial nerves 2-12 grossly intact no focal lateralizing neurologic deficit.  Speech is fluent; uvula elevated with phonation, facial symmetry and tongue midline. DTR are normal bilaterally, cerebella exam is intact, barbinski is negative  and strengths are equaled bilaterally.  No sensory loss.   Labs on Admission:  Basic Metabolic Panel:  Recent Labs Lab 01/13/15 1458 01/14/15 0435 01/15/15 0545 01/16/15 0657  NA 125* 131* 133* 136  K 4.9 4.1 3.5 2.8*  CL 87* 102 103 105  CO2 24 19* 24 26  GLUCOSE 703* 301* 243* 129*  BUN 16 10 8  <5*  CREATININE 1.06* 0.65 0.63 0.64  CALCIUM 9.3 8.0* 7.8* 7.8*   Liver Function Tests:  Recent Labs Lab 01/14/15 0435  AST 11*  ALT 14  ALKPHOS 97  BILITOT 0.9  PROT 5.7*  ALBUMIN 2.8*    Recent Labs Lab 01/13/15 1458 01/14/15 0435 01/15/15 0545  WBC 17.7* 17.4* 10.7*  HGB 15.0 11.7* 10.9*  HCT 45.0 35.3* 31.7*  MCV 90.5 91.5 91.6   PLT 266 247 218   CBG:  Recent Labs Lab 01/15/15 1159 01/15/15 1647 01/15/15 2041 01/16/15 0747 01/16/15 1150  GLUCAP 185* 249* 364* 106* 276*     Radiological Exams on Admission: Ct Abdomen W Contrast  01/15/2015  CLINICAL DATA:  Nausea, vomiting, and weakness since January 12, 2015. Patient has a history of abscess/tubal ligation that was lanced in the emergency room. EXAM: CT ABDOMEN WITH CONTRAST TECHNIQUE: Multidetector CT imaging of the abdomen was performed using the standard protocol following bolus administration of intravenous contrast. CONTRAST:  OMNIPAQUE IOHEXOL 300 MG/ML  SOLN COMPARISON:  None. FINDINGS: The liver, spleen, pancreas, gallbladder, adrenal glands and kidneys are normal. There is atherosclerosis of the abdominal aorta without aneurysmal dilatation. There is no abdominal lymphadenopathy. Visualized bowel is normal. No intra-abdominal abscess is noted. In the posterior left subcutaneous fat of the upper abdomen, there is mild stranding with small focus air, clinical correlation regarding recent site of abscess lancing is suggested. No focal discrete abscess is identified. Minimal scarring of the lung bases are noted. There is no focal pneumonia or pleural effusion. Mild degenerative joint changes of the spine are noted. IMPRESSION: No acute intra-abdominal abnormality identified. No intra-abdominal abscess noted. In the posterior left subcutaneous fat of the upper abdomen, there is mild stranding with small focus air, clinical correlation regarding recent site of abscess lancing is suggested. No focal discrete abscess is identified. Electronically Signed   By: Sherian Rein M.D.   On: 01/15/2015 13:11    EKG: Independently reviewed.    Assessment/Plan Present on Admission:  . Hyperglycemia . Diabetes mellitus type I (HCC) . Hypertension . Uncontrolled diabetes mellitus (HCC)   PLAN:   Cellulitis with superficial abscess s/p I and D, will continue  with IV Van/Zosyn.  I will ask Dr Lovell Sheehan to see if he has any further recommendation as he wound is getting worse.  CT showed no drainable abscess.    DM:  BS is better, but still not controlled  Hypokalemia:  Will need supplementation both IV and orally.    Code Status: FULL Unk Lightning, MD.  FACP [facility] Pager [phone] 7pm to 7am.  01/16/2015, 11:56 AM

## 2015-01-16 NOTE — Progress Notes (Signed)
ANTIBIOTIC CONSULT NOTE - Follow up   Pharmacy Consult for Vancomycin & Zosyn Indication: cellulitis  Allergies  Allergen Reactions  . Banana Itching  . Latex Itching    Patient Measurements: Height: 4\' 11"  (149.9 cm) Weight: 158 lb 11.7 oz (72 kg) IBW/kg (Calculated) : 43.2 Adjusted Body Weight:   Vital Signs: Temp: 98.9 F (37.2 C) (12/26 0614) Temp Source: Oral (12/26 0614) BP: 162/82 mmHg (12/26 3383) Pulse Rate: 72 (12/26 0614) Intake/Output from previous day:   Intake/Output from this shift:    Labs:  Recent Labs  01/13/15 1458 01/14/15 0435 01/15/15 0545 01/16/15 0657  WBC 17.7* 17.4* 10.7*  --   HGB 15.0 11.7* 10.9*  --   PLT 266 247 218  --   CREATININE 1.06* 0.65 0.63 0.64   Estimated Creatinine Clearance: 71 mL/min (by C-G formula based on Cr of 0.64).  Recent Labs  01/16/15 1137  VANCOTROUGH 24*     Microbiology: Recent Results (from the past 720 hour(s))  MRSA PCR Screening     Status: Abnormal   Collection Time: 01/13/15  5:55 PM  Result Value Ref Range Status   MRSA by PCR POSITIVE (A) NEGATIVE Final    Comment:        The GeneXpert MRSA Assay (FDA approved for NASAL specimens only), is one component of a comprehensive MRSA colonization surveillance program. It is not intended to diagnose MRSA infection nor to guide or monitor treatment for MRSA infections. RESULT CALLED TO, READ BACK BY AND VERIFIED WITH: CHAPELL R. AT 1307 ON [id number] BY THOMPSON S.     Medical History: Past Medical History  Diagnosis Date  . CHF (congestive heart failure) (HCC)     minamal  . Cardiomyopathy   . Hypertension   . Diabetes mellitus   . Normal echocardiogram     LVEF 25-30% 03/27/2010  . COPD (chronic obstructive pulmonary disease) (HCC)   . Non compliance w medication regimen   . DDD (degenerative disc disease), lumbar     Medications:  Prescriptions prior to admission  Medication Sig Dispense Refill Last Dose  . acetaminophen  (TYLENOL) 500 MG tablet Take 1,500 mg by mouth daily as needed for mild pain or headache.   Past Month at Unknown time  . ibuprofen (ADVIL,MOTRIN) 600 MG tablet Take 1 tablet (600 mg total) by mouth every 8 (eight) hours as needed. (Patient taking differently: Take 600 mg by mouth every 8 (eight) hours as needed for headache or moderate pain. ) 15 tablet 0 Past Week at Unknown time  . insulin glargine (LANTUS) 100 UNIT/ML injection Inject 0.35 mLs (35 Units total) into the skin at bedtime. 10 mL 11 01/12/2015 at Unknown time  . Multiple Vitamin (MULTIVITAMIN WITH MINERALS) TABS tablet Take 1 tablet by mouth daily.   Past Week at Unknown time  . traMADol (ULTRAM) 50 MG tablet Take 1 tablet (50 mg total) by mouth every 6 (six) hours as needed. (Patient taking differently: Take 50 mg by mouth every 6 (six) hours as needed for moderate pain. ) 15 tablet 0 Past Month at Unknown time  . cyclobenzaprine (FLEXERIL) 10 MG tablet Take 1 tablet (10 mg total) by mouth 3 (three) times daily as needed for muscle spasms. (Patient not taking: Reported on 01/13/2015) 12 tablet 0 Past Month at Unknown time  . dexamethasone (DECADRON) 4 MG tablet Take 1 tablet (4 mg total) by mouth 2 (two) times daily with a meal. (Patient not taking: Reported on 01/13/2015) 12 tablet 0  01/07/2015 at Unknown time  . hydrOXYzine (VISTARIL) 25 MG capsule Take 1 capsule (25 mg total) by mouth 3 (three) times daily as needed. (Patient not taking: Reported on 01/13/2015) 30 capsule 0 01/07/2015 at Unknown time  . lisinopril-hydrochlorothiazide (ZESTORETIC) 20-12.5 MG tablet Take 1 tablet by mouth daily. 30 tablet 0   . metFORMIN (GLUCOPHAGE) 1000 MG tablet Take 1 tablet (1,000 mg total) by mouth 2 (two) times daily. 30 tablet 0   . [EXPIRED] penicillin v potassium (VEETID) 500 MG tablet Take 1 tablet (500 mg total) by mouth 4 (four) times daily. 40 tablet 0   . triamcinolone cream (KENALOG) 0.1 % Apply 1 application topically 2 (two) times  daily. (Patient not taking: Reported on 01/13/2015) 30 g 0 01/07/2015 at Unknown time   Assessment: 54 yo female with swelling on her back. Evaluation in the emergency room shows that that she had a skin abscess on the back, incised and drained. Diabetic patient uncontrolled, insulin infusion started. Renal function has improved, CrCl 71 ml/min  Vancomycin trough level 24 was due to lab drawn while Vancomycin dose was infusing Vancomycin dose was started  01/16/15 at 11:23 AM Lab was drawn                          01/16/15 at  11:37 AM As a result, Vancomycin trough level is not accurate.  Patient's renal function has remained stable. Pharmacy will have Vancomycin trough level redrawn before dose due 01/17/15 at 12 Noon  Goal of Therapy:  Vancomycin trough level 10-15 mcg/ml  Plan:  Continue Vancomycin to 1 GM IV every 12 hours Continue Zosyn 3.375 GM IV every 8 hours, each dose over 8 hours Vancomycin trough tomorrow morning Monitor renal function Labs per protocol  Raquel James, Ryszard Socarras Bennett 01/16/2015,12:07 PM

## 2015-01-16 NOTE — Care Management Note (Signed)
Case Management Note  Patient Details  Name: Felicia [name] MRN: [id number] Date of Birth: 1960/02/23  Subjective/Objective:                  Pt admitted from home with hyperglycemia and abcess. Pt lives with her daughter and will return home at discharge. Pt is independent with ADL's. Pt has a glucometer.  Action/Plan: Pt follows up with the [facility] who has been helping pt with medications. Pt is going to call the [facility] once reopens from Christmas to see if she can be a pt there. Pt will need MATCH voucher at discharge. Will continue to follow for discharge planning needs. [facility] information given as well to pt.  Expected Discharge Date:  01/14/15               Expected Discharge Plan:  Home/Self Care  In-House Referral:  Financial Counselor  Discharge planning Services  CM Consult, [facility]  Post Acute Care Choice:  NA Choice offered to:  NA  DME Arranged:    DME Agency:     HH Arranged:    HH Agency:     Status of Service:  Completed, signed off  Medicare Important Message Given:    Date Medicare IM Given:    Medicare IM give by:    Date Additional Medicare IM Given:    Additional Medicare Important Message give by:     If discussed at Long Length of Stay Meetings, dates discussed:    Additional Comments:  Cheryl Flash, RN 01/16/2015, 10:44 AM

## 2015-01-17 LAB — VANCOMYCIN, TROUGH: Vancomycin Tr: 12 ug/mL (ref 10.0–20.0)

## 2015-01-17 LAB — GLUCOSE, CAPILLARY
GLUCOSE-CAPILLARY: 217 mg/dL — AB (ref 65–99)
GLUCOSE-CAPILLARY: 214 mg/dL — AB (ref 65–99)
GLUCOSE-CAPILLARY: 98 mg/dL (ref 65–99)
Glucose-Capillary: 215 mg/dL — ABNORMAL HIGH (ref 65–99)
Glucose-Capillary: 176 mg/dL — ABNORMAL HIGH (ref 65–99)

## 2015-01-17 LAB — CBC
HEMATOCRIT: 29.2 % — AB (ref 36.0–46.0)
HEMOGLOBIN: 10.4 g/dL — AB (ref 12.0–15.0)
MCH: 32.2 pg (ref 26.0–34.0)
MCHC: 35.6 g/dL (ref 30.0–36.0)
MCV: 90.4 fL (ref 78.0–100.0)
Platelets: 246 10*3/uL (ref 150–400)
RBC: 3.23 MIL/uL — ABNORMAL LOW (ref 3.87–5.11)
RDW: 14.1 % (ref 11.5–15.5)
WBC: 7.6 10*3/uL (ref 4.0–10.5)

## 2015-01-17 MED ORDER — INSULIN ASPART PROT & ASPART (70-30 MIX) 100 UNIT/ML ~~LOC~~ SUSP
35.0000 [IU] | Freq: Two times a day (BID) | SUBCUTANEOUS | Status: DC
  Administered 2015-01-17 – 2015-01-18 (×2): 35 [IU] via SUBCUTANEOUS
  Filled 2015-01-17: qty 10

## 2015-01-17 NOTE — Progress Notes (Signed)
[facility] PROGRESS NOTE  Felicia [name] ZOX:[id number] DOB: 05-12-1960    PCP:   Tylene Fantasia., PA-C   HPI:  Felicia [name] is an 54 y.o. female admitted by Dr Karilyn Cota for severe hyperosmolar hyperglycemia with a skin abscess. I and D was performed in the ER, and she was placed on IV Van and IV Zosyn, and admitted to the ICU for IV insulin drip. It was stopped subsequently. She uses 35 units of lantus and metformin, but she hadn't been taking them due to cost. She is feeling better today, and is able to eat. Her Insulin drip was subsequently stopped, and she was given Lantus with coverages. Her CBG has been under contrlled, and her BS was 185 today. She had some erythema on the sourrounding area, so a CT of her abdomen was done, showing no abscesses. There was no culture sent, but her MRSA screening is positve. Her WBC has clearly improved. There is increase purulent discharge, with an induration, and sourrounding cellulitis. Dr Lovell Sheehan saw her as the wound looks worse, but it has gotten better overnight.  Diabetic recommended Mixtard insulin, 35 units BID, as she has no insurance.    Past Medical History  Diagnosis Date  . CHF (congestive heart failure) (HCC)     minamal  . Cardiomyopathy   . Hypertension   . Diabetes mellitus   . Normal echocardiogram     LVEF 25-30% 03/27/2010  . COPD (chronic obstructive pulmonary disease) (HCC)   . Non compliance w medication regimen   . DDD (degenerative disc disease), lumbar     Past Surgical History  Procedure Laterality Date  . Cardiac catheterization    . Tubal ligation      Medications:  HOME MEDS: Prior to Admission medications   Medication Sig Start Date End Date Taking? Authorizing Provider  acetaminophen (TYLENOL) 500 MG tablet Take 1,500 mg by mouth daily as needed for mild pain or headache.   Yes Historical Provider, MD  ibuprofen (ADVIL,MOTRIN) 600 MG tablet Take 1 tablet (600 mg total) by mouth every 8  (eight) hours as needed. Patient taking differently: Take 600 mg by mouth every 8 (eight) hours as needed for headache or moderate pain.  06/30/14  Yes Azalia Bilis, MD  insulin glargine (LANTUS) 100 UNIT/ML injection Inject 0.35 mLs (35 Units total) into the skin at bedtime. 01/08/15  Yes Zadie Rhine, MD  Multiple Vitamin (MULTIVITAMIN WITH MINERALS) TABS tablet Take 1 tablet by mouth daily.   Yes Historical Provider, MD  traMADol (ULTRAM) 50 MG tablet Take 1 tablet (50 mg total) by mouth every 6 (six) hours as needed. Patient taking differently: Take 50 mg by mouth every 6 (six) hours as needed for moderate pain.  07/06/14  Yes Rolan Bucco, MD  cyclobenzaprine (FLEXERIL) 10 MG tablet Take 1 tablet (10 mg total) by mouth 3 (three) times daily as needed for muscle spasms. Patient not taking: Reported on 01/13/2015 06/30/14   Azalia Bilis, MD  dexamethasone (DECADRON) 4 MG tablet Take 1 tablet (4 mg total) by mouth 2 (two) times daily with a meal. Patient not taking: Reported on 01/13/2015 12/20/14   Ivery Quale, PA-C  hydrOXYzine (VISTARIL) 25 MG capsule Take 1 capsule (25 mg total) by mouth 3 (three) times daily as needed. Patient not taking: Reported on 01/13/2015 12/20/14   Ivery Quale, PA-C  lisinopril-hydrochlorothiazide (ZESTORETIC) 20-12.5 MG tablet Take 1 tablet by mouth daily. 01/08/15   Zadie Rhine, MD  metFORMIN (GLUCOPHAGE) 1000 MG tablet Take  1 tablet (1,000 mg total) by mouth 2 (two) times daily. 01/08/15   Zadie Rhine, MD  triamcinolone cream (KENALOG) 0.1 % Apply 1 application topically 2 (two) times daily. Patient not taking: Reported on 01/13/2015 12/20/14   Ivery Quale, PA-C     Allergies:  Allergies  Allergen Reactions  . Banana Itching  . Latex Itching    Social History:   reports that she has been smoking Cigarettes.  She has been smoking about 0.50 packs per day. She has never used smokeless tobacco. She reports that she does not drink alcohol or use  illicit drugs.  Family History: Family History  Problem Relation Age of Onset  . Stroke Mother   . Lung cancer Father      Physical Exam: Filed Vitals:   01/16/15 0614 01/16/15 1522 01/16/15 2145 01/17/15 0624  BP: 162/82 145/73 162/88 133/99  Pulse: 72 94 88 78  Temp: 98.9 F (37.2 C) 98.7 F (37.1 C) 99 F (37.2 C) 98 F (36.7 C)  TempSrc: Oral Oral Oral Oral  Resp: Height:      Weight:      SpO2: 99% 99% 99% 100%   Blood pressure 133/99, pulse 78, temperature 98 F (36.7 C), temperature source Oral, resp. rate 20, height  (1.499 m), weight 72 kg (158 lb 11.7 oz), last menstrual period 08/02/2010, SpO2 100 %.  GEN:  Pleasant  patient lying in the stretcher in no acute distress; cooperative with exam. PSYCH:  alert and oriented x4; does not appear anxious or depressed; affect is appropriate. HEENT: Mucous membranes pink and anicteric; PERRLA; EOM intact; no cervical lymphadenopathy nor thyromegaly or carotid bruit; no JVD; There were no stridor. Neck is very supple. Breasts:: Not examined CHEST WALL: No tenderness CHEST: Normal respiration, clear to auscultation bilaterally.  HEART: Regular rate and rhythm.  There are no murmur, rub, or gallops.   BACK: No kyphosis or scoliosis; no CVA tenderness ABDOMEN: soft and non-tender; no masses, no organomegaly, normal abdominal bowel sounds; no pannus; no intertriginous candida. There is no rebound and no distention. Rectal Exam: Not done EXTREMITIES: No bone or joint deformity; age-appropriate arthropathy of the hands and knees; no edema; no ulcerations.  There is no calf tenderness. Genitalia: not examined PULSES: 2+ and symmetric SKIN: Normal hydration no rash or ulceration.  She has improvement today of the cellulitis.  CNS: Cranial nerves 2-12 grossly intact no focal lateralizing neurologic deficit.  Speech is fluent; uvula elevated with phonation, facial symmetry and tongue midline. DTR are normal  bilaterally, cerebella exam is intact, barbinski is negative and strengths are equaled bilaterally.  No sensory loss.   Labs on Admission:  Basic Metabolic Panel:  Recent Labs Lab 01/13/15 1458 01/14/15 0435 01/15/15 0545 01/16/15 0657  NA 125* 131* 133* 136  K 4.9 4.1 3.5 2.8*  CL 87* 102 103 105  CO2 24 19* 24 26  GLUCOSE 703* 301* 243* 129*  BUN <5*  CREATININE 1.06* 0.65 0.63 0.64  CALCIUM 9.3 8.0* 7.8* 7.8*   Liver Function Tests:  Recent Labs Lab 01/14/15 0435  AST 11*  ALT 14  ALKPHOS 97  BILITOT 0.9  PROT 5.7*  ALBUMIN 2.8*   CBC:  Recent Labs Lab 01/13/15 1458 01/14/15 0435 01/15/15 0545 01/17/15 0619  WBC 17.7* 17.4* 10.7* 7.6  HGB 15.0 11.7* 10.9* 10.4*  HCT 45.0 35.3* 31.7* 29.2*  MCV 90.5 91.5 91.6 90.4  PLT 266 247 218 246  CBG:  Recent Labs Lab 01/16/15 1636 01/16/15 2008 01/17/15 0727 01/17/15 0739 01/17/15 1116  GLUCAP 251* 156* 215* 176* 217*   Assessment/Plan Present on Admission:  . Hyperglycemia . Diabetes mellitus type I (HCC) . Hypertension . Uncontrolled diabetes mellitus (HCC)   PLAN: Cellulitis with superficial abscess s/p I and D, will continue with IV Van/Zosyn.  Dr Lovell Sheehan saw her and recommend to continue same antibiotics, discharge on Bactrim or Tobra for 2 weeks, and see him in follow up PRN.  CT showed no drainable abscess.  DM: BS is much better controlled at this time.  Will change to 70/30 anticipating lower cost as she has no insurance.   Hypokalemia: Have supplemented both IV and orally, now on oral supplement.  Will recheck tomorrow.    Code Status: FULL Unk Lightning, MD.  FACP [facility] Pager [phone] 7pm to 7am.  01/17/2015, 2:08 PM

## 2015-01-17 NOTE — Consult Note (Signed)
Reason for Consult: Abscess, back Referring Physician: Hospitalist  Felicia [name] is an 54 y.o. female.  HPI: Patient is a 54 year old black female who presented emergency room with an abscess on her back as well as hyperglycemia. Incision and drainage was performed in the emergency room. She was admitted to the hospital for control of her hyperglycemia. Today, the wound continues to drain purulent fluid. Cultures are pending. Patient states she does feel better.  Past Medical History  Diagnosis Date  . CHF (congestive heart failure) (HCC)     minamal  . Cardiomyopathy   . Hypertension   . Diabetes mellitus   . Normal echocardiogram     LVEF 25-30% 03/27/2010  . COPD (chronic obstructive pulmonary disease) ([place])   . Non compliance w medication regimen   . DDD (degenerative disc disease), lumbar     Past Surgical History  Procedure Laterality Date  . Cardiac catheterization    . Tubal ligation      Family History  Problem Relation Age of Onset  . Stroke Mother   . Lung cancer Father     Social History:  reports that she has been smoking Cigarettes.  She has been smoking about 0.50 packs per day. She has never used smokeless tobacco. She reports that she does not drink alcohol or use illicit drugs.  Allergies:  Allergies  Allergen Reactions  . Banana Itching  . Latex Itching    Medications: I have reviewed the patient's current medications.  Results for orders placed or performed during the hospital encounter of 01/13/15 (from the past 48 hour(s))  Glucose, capillary     Status: Abnormal   Collection Time: 01/15/15 11:59 AM  Result Value Ref Range   Glucose-Capillary 185 (H) 65 - 99 mg/dL   Comment 1 Notify RN    Comment 2 Document in Chart   Glucose, capillary     Status: Abnormal   Collection Time: 01/15/15  4:47 PM  Result Value Ref Range   Glucose-Capillary 249 (H) 65 - 99 mg/dL  Glucose, capillary     Status: Abnormal   Collection Time: 01/15/15  8:41 PM   Result Value Ref Range   Glucose-Capillary 364 (H) 65 - 99 mg/dL   Comment 1 Notify RN   Basic metabolic panel     Status: Abnormal   Collection Time: 01/16/15  6:57 AM  Result Value Ref Range   Sodium 136 135 - 145 mmol/L   Potassium 2.8 (L) 3.5 - 5.1 mmol/L    Comment: DELTA CHECK NOTED   Chloride 105 101 - 111 mmol/L   CO2 26 22 - 32 mmol/L   Glucose, Bld 129 (H) 65 - 99 mg/dL   BUN <5 (L) 6 - 20 mg/dL   Creatinine, Ser 0.64 0.44 - 1.00 mg/dL   Calcium 7.8 (L) 8.9 - 10.3 mg/dL   GFR calc non Af Amer >60 >60 mL/min   GFR calc Af Amer >60 >60 mL/min    Comment: (NOTE) The eGFR has been calculated using the CKD EPI equation. This calculation has not been validated in all clinical situations. eGFR's persistently <60 mL/min signify possible Chronic Kidney Disease.    Anion gap 5 5 - 15  Glucose, capillary     Status: Abnormal   Collection Time: 01/16/15  7:47 AM  Result Value Ref Range   Glucose-Capillary 106 (H) 65 - 99 mg/dL  Vancomycin, trough     Status: Abnormal   Collection Time: 01/16/15 11:37 AM  Result Value  Ref Range   Vancomycin Tr 24 (H) 10.0 - 20.0 ug/mL  Glucose, capillary     Status: Abnormal   Collection Time: 01/16/15 11:50 AM  Result Value Ref Range   Glucose-Capillary 276 (H) 65 - 99 mg/dL  Glucose, capillary     Status: Abnormal   Collection Time: 01/16/15  4:36 PM  Result Value Ref Range   Glucose-Capillary 251 (H) 65 - 99 mg/dL  Glucose, capillary     Status: Abnormal   Collection Time: 01/16/15  8:08 PM  Result Value Ref Range   Glucose-Capillary 156 (H) 65 - 99 mg/dL   Comment 1 Notify RN    Comment 2 Document in Chart   CBC     Status: Abnormal   Collection Time: 01/17/15  6:19 AM  Result Value Ref Range   WBC 7.6 4.0 - 10.5 K/uL   RBC 3.23 (L) 3.87 - 5.11 MIL/uL   Hemoglobin 10.4 (L) 12.0 - 15.0 g/dL   HCT 29.2 (L) 36.0 - 46.0 %   MCV 90.4 78.0 - 100.0 fL   MCH 32.2 26.0 - 34.0 pg   MCHC 35.6 30.0 - 36.0 g/dL   RDW 14.1 11.5 - 15.5 %    Platelets 246 150 - 400 K/uL  Glucose, capillary     Status: Abnormal   Collection Time: 01/17/15  7:27 AM  Result Value Ref Range   Glucose-Capillary 215 (H) 65 - 99 mg/dL  Glucose, capillary     Status: Abnormal   Collection Time: 01/17/15  7:39 AM  Result Value Ref Range   Glucose-Capillary 176 (H) 65 - 99 mg/dL    Ct Abdomen W Contrast  01/15/2015  CLINICAL DATA:  Nausea, vomiting, and weakness since January 12, 2015. Patient has a history of abscess/tubal ligation that was lanced in the emergency room. EXAM: CT ABDOMEN WITH CONTRAST TECHNIQUE: Multidetector CT imaging of the abdomen was performed using the standard protocol following bolus administration of intravenous contrast. CONTRAST:  137m OMNIPAQUE IOHEXOL 300 MG/ML  SOLN COMPARISON:  None. FINDINGS: The liver, spleen, pancreas, gallbladder, adrenal glands and kidneys are normal. There is atherosclerosis of the abdominal aorta without aneurysmal dilatation. There is no abdominal lymphadenopathy. Visualized bowel is normal. No intra-abdominal abscess is noted. In the posterior left subcutaneous fat of the upper abdomen, there is mild stranding with small focus air, clinical correlation regarding recent site of abscess lancing is suggested. No focal discrete abscess is identified. Minimal scarring of the lung bases are noted. There is no focal pneumonia or pleural effusion. Mild degenerative joint changes of the spine are noted. IMPRESSION: No acute intra-abdominal abnormality identified. No intra-abdominal abscess noted. In the posterior left subcutaneous fat of the upper abdomen, there is mild stranding with small focus air, clinical correlation regarding recent site of abscess lancing is suggested. No focal discrete abscess is identified. Electronically Signed   By: WAbelardo DieselM.D.   On: 01/15/2015 13:11    ROS: See chart Blood pressure 133/99, pulse 78, temperature 98 F (36.7 C), temperature source Oral, resp. rate 20, height  _0  (1.499 m), weight 72 kg (158 lb 11.7 oz), last menstrual period 08/02/2010, SpO2 100 %. Physical Exam: Pleasant black female in no acute distress. Skin examination reveals a draining wound along the left mid back. Mild erythema is present.  Assessment/Plan: Impression: Resolving abscess, back. CT scan is reassuring that this is not deeper. As it is draining, no need for further surgical intervention at this time. Once patient is discharged,  would suggest Bactrim or tobramycin for antibiotic coverage for two weeks.  Needs to have tight glucose control. She should keep the wound clean with soap and water. May follow-up in my office after discharge as needed.  Airik Goodlin A 01/17/2015, 8:31 AM

## 2015-01-17 NOTE — Discharge Instructions (Signed)
Abscess °An abscess (boil or furuncle) is an infected area on or under the skin. This area is filled with yellowish-white fluid (pus) and other material (debris). °HOME CARE  °· Only take medicines as told by your doctor. °· If you were given antibiotic medicine, take it as directed. Finish the medicine even if you start to feel better. °· If gauze is used, follow your doctor's directions for changing the gauze. °· To avoid spreading the infection: °¨ Keep your abscess covered with a bandage. °¨ Wash your hands well. °¨ Do not share personal care items, towels, or whirlpools with others. °¨ Avoid skin contact with others. °· Keep your skin and clothes clean around the abscess. °· Keep all doctor visits as told. °GET HELP RIGHT AWAY IF:  °· You have more pain, puffiness (swelling), or redness in the wound site. °· You have more fluid or blood coming from the wound site. °· You have muscle aches, chills, or you feel sick. °· You have a fever. °MAKE SURE YOU:  °· Understand these instructions. °· Will watch your condition. °· Will get help right away if you are not doing well or get worse. °  °This information is not intended to replace advice given to you by your health care provider. Make sure you discuss any questions you have with your health care provider. °  °Document Released: 06/26/2007 Document Revised: 07/09/2011 Document Reviewed: 03/23/2011 °Elsevier Interactive Patient Education ©2016 [employer] ° °

## 2015-01-17 NOTE — Progress Notes (Signed)
Inpatient Diabetes Program Recommendations  AACE/ADA: New Consensus Statement on Inpatient Glycemic Control (2015)  Target Ranges:  Prepandial:   less than 140 mg/dL      Peak postprandial:   less than 180 mg/dL (1-2 hours)      Critically ill patients:  140 - 180 mg/dL   Review of Glycemic Control  Inpatient Diabetes Program Recommendations:    Consider [employer] brand ReliOn 70/30 (NPH/R) 35 units BID with meals at discharge.  This is a generic available at [facility] for approximately $25 a vial.   Pt will need to be educated about timing of meals with premixed insulin if it is decided she will be discharged on it.  Premix should be taken 30 minutes prior to breakfast and supper.   Thank you  Piedad Climes BSN, RN,CDE Inpatient Diabetes Coordinator [phone] (team pager)

## 2015-01-17 NOTE — Progress Notes (Signed)
Pharmacy Antibiotic Follow-up Note  Felicia [name] is a 54 y.o. year-old female admitted on 01/13/2015.  The patient is currently on day 5 of Vancomycin and Zosyn for cellulitis.  Assessment/Plan: This patient's current antibiotics will be continued without adjustments.  F/U to DEESCALATE ABX as appropriate.  Consider d/c Zosyn and continue Vancomycin MRSA pcr (+)  Temp (24hrs), Avg:98.6 F (37 C), Min:98 F (36.7 C), Max:99 F (37.2 C)   Recent Labs Lab 01/13/15 1458 01/14/15 0435 01/15/15 0545 01/17/15 0619  WBC 17.7* 17.4* 10.7* 7.6    Recent Labs Lab 01/13/15 1458 01/14/15 0435 01/15/15 0545 01/16/15 0657  CREATININE 1.06* 0.65 0.63 0.64   Estimated Creatinine Clearance: 71 mL/min (by C-G formula based on Cr of 0.64).    Allergies  Allergen Reactions  . Banana Itching  . Latex Itching   Antimicrobials this admission: Vancomycin 12/23 >>  Zosyn 12/23 >>   Levels/dose changes this admission: Vancomycin level = 12 on 12/27, not indicated for dose change  Microbiology results:  12/23 MRSA PCR: (+)  Thank you for allowing pharmacy to be a part of this patient's care.  Valrie Hart A PharmD 01/17/2015 1:11 PM

## 2015-01-18 DIAGNOSIS — E10311 Type 1 diabetes mellitus with unspecified diabetic retinopathy with macular edema: Secondary | ICD-10-CM

## 2015-01-18 DIAGNOSIS — L02212 Cutaneous abscess of back [any part, except buttock]: Secondary | ICD-10-CM

## 2015-01-18 LAB — BASIC METABOLIC PANEL
ANION GAP: 7 (ref 5–15)
BUN: 7 mg/dL (ref 6–20)
CHLORIDE: 106 mmol/L (ref 101–111)
CO2: 28 mmol/L (ref 22–32)
Calcium: 7.8 mg/dL — ABNORMAL LOW (ref 8.9–10.3)
Creatinine, Ser: 0.47 mg/dL (ref 0.44–1.00)
GFR calc non Af Amer: >60 mL/min (ref >60)
Glucose, Bld: 86 mg/dL (ref 65–99)
POTASSIUM: 2.5 mmol/L — AB (ref 3.5–5.1)
Sodium: 141 mmol/L (ref 135–145)

## 2015-01-18 LAB — GLUCOSE, CAPILLARY
GLUCOSE-CAPILLARY: 124 mg/dL — AB (ref 65–99)
GLUCOSE-CAPILLARY: 196 mg/dL — AB (ref 65–99)

## 2015-01-18 MED ORDER — POTASSIUM CHLORIDE CRYS ER 20 MEQ PO TBCR
40.0000 meq | EXTENDED_RELEASE_TABLET | ORAL | Status: DC
  Administered 2015-01-18 (×3): 40 meq via ORAL
  Filled 2015-01-18 (×3): qty 2

## 2015-01-18 MED ORDER — SULFAMETHOXAZOLE-TRIMETHOPRIM 800-160 MG PO TABS: 1.0000 | ORAL_TABLET | Freq: Two times a day (BID) | ORAL | Status: DC

## 2015-01-18 MED ORDER — INSULIN ASPART PROT & ASPART (70-30 MIX) 100 UNIT/ML ~~LOC~~ SUSP: 35.0000 [IU] | Freq: Two times a day (BID) | SUBCUTANEOUS | Status: DC

## 2015-01-18 MED ORDER — POTASSIUM CHLORIDE CRYS ER 20 MEQ PO TBCR: 40.0000 meq | EXTENDED_RELEASE_TABLET | ORAL | Status: DC

## 2015-01-18 NOTE — Progress Notes (Signed)
Pt's IV catheter removed and intact. Pt's IV site clean, dry and intact. Pt's dressing changed and clean dry and intact. Discharge instructions, medications and follow up appointments reviewed and discussed with patient. All questions were answered and no further questions at this time. Pt verbalized understanding of discharge instructions including medications. Pt in stable condition and in no acute distress at time of discharge. Pt will be escorted by nurse tech.

## 2015-01-18 NOTE — Care Management Note (Signed)
Case Management Note  Patient Details  Name: MAIANA HUFFMASTER MRN: [id number] Date of Birth: 1960/10/06  Subjective/Objective:                    Action/Plan:   Expected Discharge Date:  01/14/15               Expected Discharge Plan:  Home/Self Care  In-House Referral:  Financial Counselor  Discharge planning Services  CM Consult, [facility] Program  Post Acute Care Choice:  NA Choice offered to:  NA  DME Arranged:    DME Agency:     HH Arranged:    HH Agency:     Status of Service:  Completed, signed off  Medicare Important Message Given:    Date Medicare IM Given:    Medicare IM give by:    Date Additional Medicare IM Given:    Additional Medicare Important Message give by:     If discussed at Long Length of Stay Meetings, dates discussed:    Additional Comments: Pt discharged home today. MATCH voucher given to pt for medication assistance. Pt verbalized understanding of voucher.Pt follows up with the [facility]. Pt has information on transportation to MD appts with [facility]. Pt is also going to contact the [facility] to see if they can provide any assistance as well for medication assistance. No further CM needs noted. Pt and pts nurse aware of discharge arrangements. Arlyss Queen [place], RN 01/18/2015, 2:33 PM

## 2015-01-18 NOTE — Discharge Summary (Signed)
Physician Discharge Summary  Felicia [name]:[id number] DOB: [birth year] DOA: 01/13/2015  PCP: Kizzie Furnish D., PA-C  Admit date: 01/13/2015 Discharge date: 01/18/2015  Time spent: 45 minutes  Recommendations for Outpatient Follow-up:  -We'll be discharged home today. -Case management has arranged for a new PCP follow-up, she has been given Dr. Lovell Sheehan office number and will follow-up as needed. -We'll need basic metabolic profile at time of follow-up to follow potassium levels.   Discharge Diagnoses:  Active Problems:   Nonischemic cardiomyopathy (HCC)   Diabetes mellitus type I (HCC)   Hypertension   Hyperglycemia   Back abscess   Uncontrolled diabetes mellitus (HCC)   Discharge Condition: Stable and improved  Filed Weights   01/13/15 1413 01/13/15 1759 01/14/15 0600  Weight: 69.854 kg (154 lb) 69.8 kg (153 lb 14.1 oz) 72 kg (158 lb 11.7 oz)    History of present illness:  As per Dr. Karilyn Cota on 12/23: Felicia [name] is a 54 y.o. female  This is a 54 year old lady, diabetic, cardiomyopathy, who presents with episodes of vomiting last night and weakness. She has not taken her diabetic medications for some time because she cannot afford them. She denies any fever. She has developed a swelling on her back. Evaluation in the emergency room shows that that she had a skin abscess on the back which was incised and drained. Also her blood glucose is over 700 although thankfully she is not in DKA. She is now being admitted for further management. She denies fever.   Hospital Course:   Cellulitis of the back with superficial abscess -Status post I and D. -Has drained spontaneously, hence Dr. Lovell Sheehan does not continue further incision and drainage necessary. -CT without drainable abscess. -We will DC on 14 days of Bactrim DS.  Insulin-dependent diabetes mellitus -Has been transitioned over to 70/30 regimen given its lower cost she has no insurance and is unable to  afford Lantus. -Fairly well controlled.  Hypokalemia -Potassium is 2.9 on day of discharge, will be discharged home on 40 mEq of KCl to take every 4 hours 4 doses.  Procedures:  None   Consultations:  None  Discharge Instructions  Discharge Instructions    Diet - low sodium heart healthy    Complete by:  As directed      Increase activity slowly    Complete by:  As directed             Medication List    STOP taking these medications        cyclobenzaprine 10 MG tablet  Commonly known as:  FLEXERIL     dexamethasone 4 MG tablet  Commonly known as:  DECADRON     hydrOXYzine 25 MG capsule  Commonly known as:  VISTARIL     ibuprofen 600 MG tablet  Commonly known as:  ADVIL,MOTRIN     insulin glargine 100 UNIT/ML injection  Commonly known as:  LANTUS     penicillin v potassium 500 MG tablet  Commonly known as:  VEETID      TAKE these medications        acetaminophen 500 MG tablet  Commonly known as:  TYLENOL  Take 1,500 mg by mouth daily as needed for mild pain or headache.     insulin aspart protamine- aspart (70-30) 100 UNIT/ML injection  Commonly known as:  NOVOLOG MIX 70/30  Inject 0.35 mLs (35 Units total) into the skin 2 (two) times daily with a meal.     lisinopril-hydrochlorothiazide  20-12.5 MG tablet  Commonly known as:  ZESTORETIC  Take 1 tablet by mouth daily.     metFORMIN 1000 MG tablet  Commonly known as:  GLUCOPHAGE  Take 1 tablet (1,000 mg total) by mouth 2 (two) times daily.     multivitamin with minerals Tabs tablet  Take 1 tablet by mouth daily.     potassium chloride SA 20 MEQ tablet  Commonly known as:  K-DUR,KLOR-CON  Take 2 tablets (40 mEq total) by mouth every 4 (four) hours.     sulfamethoxazole-trimethoprim 800-160 MG tablet  Commonly known as:  BACTRIM DS,SEPTRA DS  Take 1 tablet by mouth 2 (two) times daily.     traMADol 50 MG tablet  Commonly known as:  ULTRAM  Take 1 tablet (50 mg total) by mouth every 6 (six)  hours as needed.     triamcinolone cream 0.1 %  Commonly known as:  KENALOG  Apply 1 application topically 2 (two) times daily.       Allergies  Allergen Reactions  . Banana Itching  . Latex Itching       Follow-up Information    Follow up with Dalia Heading, MD.   Specialty:  General Surgery   Why:  As needed   Contact information:   [address] [place] [address] [phone]       Follow up with MUSE,ROCHELLE D., PA-C. Schedule an appointment as soon as possible for a visit in 2 weeks.   Contact information:   [address] [place] [address] [phone]        The results of significant diagnostics from this hospitalization (including imaging, microbiology, ancillary and laboratory) are listed below for reference.    Significant Diagnostic Studies: Ct Abdomen W Contrast  01/15/2015  CLINICAL DATA:  Nausea, vomiting, and weakness since January 12, 2015. Patient has a history of abscess/tubal ligation that was lanced in the emergency room. EXAM: CT ABDOMEN WITH CONTRAST TECHNIQUE: Multidetector CT imaging of the abdomen was performed using the standard protocol following bolus administration of intravenous contrast. CONTRAST:  OMNIPAQUE IOHEXOL 300 MG/ML  SOLN COMPARISON:  None. FINDINGS: The liver, spleen, pancreas, gallbladder, adrenal glands and kidneys are normal. There is atherosclerosis of the abdominal aorta without aneurysmal dilatation. There is no abdominal lymphadenopathy. Visualized bowel is normal. No intra-abdominal abscess is noted. In the posterior left subcutaneous fat of the upper abdomen, there is mild stranding with small focus air, clinical correlation regarding recent site of abscess lancing is suggested. No focal discrete abscess is identified. Minimal scarring of the lung bases are noted. There is no focal pneumonia or pleural effusion. Mild degenerative joint changes of the spine are noted. IMPRESSION: No acute  intra-abdominal abnormality identified. No intra-abdominal abscess noted. In the posterior left subcutaneous fat of the upper abdomen, there is mild stranding with small focus air, clinical correlation regarding recent site of abscess lancing is suggested. No focal discrete abscess is identified. Electronically Signed   By: Sherian Rein M.D.   On: 01/15/2015 13:11    Microbiology: Recent Results (from the past 240 hour(s))  MRSA PCR Screening     Status: Abnormal   Collection Time: 01/13/15  5:55 PM  Result Value Ref Range Status   MRSA by PCR POSITIVE (A) NEGATIVE Final    Comment:        The GeneXpert MRSA Assay (FDA approved for NASAL specimens only), is one component of a comprehensive MRSA colonization surveillance program. It is not  intended to diagnose MRSA infection nor to guide or monitor treatment for MRSA infections. RESULT CALLED TO, READ BACK BY AND VERIFIED WITH: CHAPELL R. AT 1307 ON [id number] BY THOMPSON S.      Labs: Basic Metabolic Panel:  Recent Labs Lab 01/13/15 1458 01/14/15 0435 01/15/15 0545 01/16/15 0657 01/18/15 0606  NA 125* 131* 133* 136 141  K 4.9 4.1 3.5 2.8* 2.5*  CL 87* 102 103 105 106  CO2 24 19* 24 26 28   GLUCOSE 703* 301* 243* 129* 86  BUN 16 10 8  <5* 7  CREATININE 1.06* 0.65 0.63 0.64 0.47  CALCIUM 9.3 8.0* 7.8* 7.8* 7.8*   Liver Function Tests:  Recent Labs Lab 01/14/15 0435  AST 11*  ALT 14  ALKPHOS 97  BILITOT 0.9  PROT 5.7*  ALBUMIN 2.8*   No results for input(s): LIPASE, AMYLASE in the last 168 hours. No results for input(s): AMMONIA in the last 168 hours. CBC:  Recent Labs Lab 01/13/15 1458 01/14/15 0435 01/15/15 0545 01/17/15 0619  WBC 17.7* 17.4* 10.7* 7.6  HGB 15.0 11.7* 10.9* 10.4*  HCT 45.0 35.3* 31.7* 29.2*  MCV 90.5 91.5 91.6 90.4  PLT 266 247 218 246   Cardiac Enzymes: No results for input(s): CKTOTAL, CKMB, CKMBINDEX, TROPONINI in the last 168 hours. BNP: BNP (last 3 results) No results for  input(s): BNP in the last 8760 hours.  ProBNP (last 3 results) No results for input(s): PROBNP in the last 8760 hours.  CBG:  Recent Labs Lab 01/17/15 1116 01/17/15 1628 01/17/15 2015 01/18/15 0751 01/18/15 1135  GLUCAP 217* 214* 98 124* 196*       Signed:  HERNANDEZ ACOSTA,Deborrah Mabin  [facility] Pager: [phone] 01/18/2015, 2:52 PM

## 2015-01-18 NOTE — Progress Notes (Signed)
CRITICAL VALUE ALERT  Critical value received:  Potassium 2.5  Date of notification:  01/18/2015  Time of notification:  0740  Critical value read back: Yes  Nurse who received alert:  Johnsie Cancel  MD notified (1st page):  Dr. Ardyth Harps  Time of first page:  [phone]

## 2015-01-18 NOTE — Progress Notes (Signed)
In reviewing chart, noted patient was not taking her DM medications due to cost issue. Talked with patient over the phone and patient states that she could not afford to get Lantus insulin. Explained to the patient that she has been switched to 70/30 insulin which can can purchase at [facility] as NOVOLIN 70/30 for $25 per vial. Discussed 70/30 insulin and explained that 70/30 is given BID with breakfast and supper. Patient states that she will need a new glucometer. Informed patient she could go to [employer] and purchase the RELI-ON glucometer for about $10 and a box of 50 test strips for $9. Patient reports that she plans to try to establish care with the [facility] and she will check with them to see if they have any glucometers they can provide her with. Encouraged patient to search online for insulin manufacture medication assistance to see if she qualifies for free insulin. Patient would need to complete online application and have a PCP complete part of the paperwork and then fax in to the insulin manufacture. Stressed importance of glycemic control to decrease risk of complications of uncontrolled diabetes. Patient states that she understands information discussed and that she has no further questions at this time related to diabetes. Talked with Tammy, RN, CM and she will provide patient with handout on Novolin 70/30 and Reli-On glucometer and test strips.  Thanks, Orlando Penner, RN, MSN, CDE Diabetes Coordinator Inpatient Diabetes Program [phone] (Team Pager from 8am to 5pm) [phone] ([facility] office) [phone] [facility] office) [phone] [facility] office)

## 2015-08-17 ENCOUNTER — Encounter (HOSPITAL_COMMUNITY): Payer: Self-pay | Admitting: Emergency Medicine

## 2015-08-17 ENCOUNTER — Emergency Department (HOSPITAL_COMMUNITY)
Admission: EM | Admit: 2015-08-17 | Discharge: 2015-08-17 | Disposition: A | Discharge status: Home / Self Care | Payer: Self-pay | Attending: Emergency Medicine | Admitting: Emergency Medicine

## 2015-08-17 DIAGNOSIS — Z7984 Long term (current) use of oral hypoglycemic drugs: Secondary | ICD-10-CM | POA: Insufficient documentation

## 2015-08-17 DIAGNOSIS — J449 Chronic obstructive pulmonary disease, unspecified: Secondary | ICD-10-CM | POA: Insufficient documentation

## 2015-08-17 DIAGNOSIS — I110 Hypertensive heart disease with heart failure: Secondary | ICD-10-CM | POA: Insufficient documentation

## 2015-08-17 DIAGNOSIS — Z79899 Other long term (current) drug therapy: Secondary | ICD-10-CM | POA: Insufficient documentation

## 2015-08-17 DIAGNOSIS — Z794 Long term (current) use of insulin: Secondary | ICD-10-CM | POA: Insufficient documentation

## 2015-08-17 DIAGNOSIS — I509 Heart failure, unspecified: Secondary | ICD-10-CM | POA: Insufficient documentation

## 2015-08-17 DIAGNOSIS — F17210 Nicotine dependence, cigarettes, uncomplicated: Secondary | ICD-10-CM | POA: Insufficient documentation

## 2015-08-17 DIAGNOSIS — N390 Urinary tract infection, site not specified: Secondary | ICD-10-CM | POA: Insufficient documentation

## 2015-08-17 LAB — BASIC METABOLIC PANEL
ANION GAP: 10 (ref 5–15)
BUN: 24 mg/dL — ABNORMAL HIGH (ref 6–20)
CALCIUM: 9.1 mg/dL (ref 8.9–10.3)
CO2: 22 mmol/L (ref 22–32)
Chloride: 99 mmol/L — ABNORMAL LOW (ref 101–111)
Creatinine, Ser: 0.9 mg/dL (ref 0.44–1.00)
GLUCOSE: 477 mg/dL — AB (ref 65–99)
Potassium: 4.1 mmol/L (ref 3.5–5.1)
Sodium: 131 mmol/L — ABNORMAL LOW (ref 135–145)

## 2015-08-17 LAB — URINALYSIS, ROUTINE W REFLEX MICROSCOPIC
Bilirubin Urine: NEGATIVE
Glucose, UA: >1000 mg/dL — AB
NITRITE: NEGATIVE
PROTEIN: NEGATIVE mg/dL
Specific Gravity, Urine: 1.01 (ref 1.005–1.030)
pH: 5 (ref 5.0–8.0)

## 2015-08-17 LAB — CBC
HEMATOCRIT: 42.7 % (ref 36.0–46.0)
Hemoglobin: 14.2 g/dL (ref 12.0–15.0)
MCH: 30.5 pg (ref 26.0–34.0)
MCHC: 33.3 g/dL (ref 30.0–36.0)
MCV: 91.6 fL (ref 78.0–100.0)
PLATELETS: 230 10*3/uL (ref 150–400)
RBC: 4.66 MIL/uL (ref 3.87–5.11)
RDW: 14 % (ref 11.5–15.5)
WBC: 10.1 10*3/uL (ref 4.0–10.5)

## 2015-08-17 LAB — CBG MONITORING, ED
GLUCOSE-CAPILLARY: 537 mg/dL — AB (ref 65–99)
Glucose-Capillary: 331 mg/dL — ABNORMAL HIGH (ref 65–99)
Glucose-Capillary: 226 mg/dL — ABNORMAL HIGH (ref 65–99)

## 2015-08-17 LAB — URINE MICROSCOPIC-ADD ON

## 2015-08-17 MED ORDER — INSULIN ASPART 100 UNIT/ML ~~LOC~~ SOLN
15.0000 [IU] | Freq: Once | SUBCUTANEOUS | Status: AC
  Administered 2015-08-17: 15 [IU] via SUBCUTANEOUS
  Filled 2015-08-17: qty 1

## 2015-08-17 MED ORDER — CEPHALEXIN 500 MG PO CAPS: 500.0000 mg | ORAL_CAPSULE | Freq: Four times a day (QID) | ORAL | 0 refills | Status: DC

## 2015-08-17 MED ORDER — INSULIN ASPART PROT & ASPART (70-30 MIX) 100 UNIT/ML ~~LOC~~ SUSP
SUBCUTANEOUS | Status: AC
  Filled 2015-08-17: qty 10

## 2015-08-17 MED ORDER — CEPHALEXIN 500 MG PO CAPS
500.0000 mg | ORAL_CAPSULE | Freq: Once | ORAL | Status: AC
  Administered 2015-08-17: 500 mg via ORAL
  Filled 2015-08-17: qty 1

## 2015-08-17 MED ORDER — INSULIN ASPART 100 UNIT/ML ~~LOC~~ SOLN
SUBCUTANEOUS | Status: AC
  Filled 2015-08-17: qty 1

## 2015-08-17 MED ORDER — INSULIN ASPART PROT & ASPART (70-30 MIX) 100 UNIT/ML ~~LOC~~ SUSP
35.0000 [IU] | Freq: Once | SUBCUTANEOUS | Status: AC
  Administered 2015-08-17: 35 [IU] via SUBCUTANEOUS
  Filled 2015-08-17: qty 10

## 2015-08-17 MED ORDER — FLUCONAZOLE 100 MG PO TABS
200.0000 mg | ORAL_TABLET | Freq: Once | ORAL | Status: AC
  Administered 2015-08-17: 200 mg via ORAL
  Filled 2015-08-17: qty 2

## 2015-08-17 MED ORDER — SODIUM CHLORIDE 0.9 % IV BOLUS (SEPSIS)
1000.0000 mL | Freq: Once | INTRAVENOUS | Status: AC
  Administered 2015-08-17: 1000 mL via INTRAVENOUS

## 2015-08-17 NOTE — ED Notes (Signed)
Pt says she ran out insulin about one month ago because she did not have any money.

## 2015-08-17 NOTE — ED Notes (Signed)
Crackers peanut butter and water provided as pt reports no meal today- Dr Estell Harpin over to discuss

## 2015-08-17 NOTE — ED Provider Notes (Signed)
[facility]-EMERGENCY DEPT Provider Note   CSN: [id number] Arrival date & time: 08/17/15  1553  First Provider Contact:  First MD Initiated Contact with Patient 08/17/15 1622        History   Chief Complaint Chief Complaint  Patient presents with  . Hyperglycemia    HPI Felicia [name] is a 55 y.o. female.  The patient states she's been out of her 70/30 NovoLog and she says she has a urinary tract infection and yeast infection.   Patient also states her sugars elevated   The history is provided by the patient. No language interpreter was used.  Hyperglycemia  Blood sugar level PTA:  Over 500 Severity:  Severe Onset quality:  Sudden Timing:  Constant Progression:  Waxing and waning Chronicity:  Recurrent Diabetes status:  Controlled with insulin Context: change in medication   Relieved by:  Nothing Ineffective treatments:  None tried Associated symptoms: dysuria   Associated symptoms: no abdominal pain, no chest pain and no fatigue     Past Medical History:  Diagnosis Date  . Cardiomyopathy   . CHF (congestive heart failure) (HCC)    minamal  . COPD (chronic obstructive pulmonary disease) (HCC)   . DDD (degenerative disc disease), lumbar   . Diabetes mellitus   . Hypertension   . Non compliance w medication regimen   . Normal echocardiogram    LVEF 25-30% 03/27/2010    Patient Active Problem List   Diagnosis Date Noted  . Hyperglycemia 01/13/2015  . Back abscess 01/13/2015  . Uncontrolled diabetes mellitus (HCC) 01/13/2015  . Hypertension 05/31/2011  . Nonischemic cardiomyopathy (HCC) 04/11/2010  . Diabetes mellitus type I (HCC) 04/11/2010    Past Surgical History:  Procedure Laterality Date  . CARDIAC CATHETERIZATION    . TUBAL LIGATION      OB History    Gravida Para Term Preterm AB Living   7 5 5   2 5    SAB TAB Ectopic Multiple Live Births   2               Home Medications    Prior to Admission medications   Medication Sig Start Date End  Date Taking? Authorizing Provider  insulin aspart protamine- aspart (NOVOLOG MIX 70/30) (70-30) 100 UNIT/ML injection Inject 0.35 mLs (35 Units total) into the skin 2 (two) times daily with a meal. 01/18/15  Yes Estela Isaiah Blakes, MD  lisinopril-hydrochlorothiazide (ZESTORETIC) 20-12.5 MG tablet Take 1 tablet by mouth daily. 01/08/15  Yes Zadie Rhine, MD  Multiple Vitamin (MULTIVITAMIN WITH MINERALS) TABS tablet Take 1 tablet by mouth daily.   Yes Historical Provider, MD  acetaminophen (TYLENOL) 500 MG tablet Take 1,500 mg by mouth daily as needed for mild pain or headache.    Historical Provider, MD  cephALEXin (KEFLEX) 500 MG capsule Take 1 capsule (500 mg total) by mouth 4 (four) times daily. 08/17/15   Bethann Berkshire, MD  metFORMIN (GLUCOPHAGE) 1000 MG tablet Take 1 tablet (1,000 mg total) by mouth 2 (two) times daily. Patient not taking: Reported on 08/17/2015 01/08/15   Zadie Rhine, MD  potassium chloride SA (K-DUR,KLOR-CON) 20 MEQ tablet Take 2 tablets (40 mEq total) by mouth every 4 (four) hours. Patient not taking: Reported on 08/17/2015 01/18/15   Henderson Cloud, MD  sulfamethoxazole-trimethoprim (BACTRIM DS,SEPTRA DS) 800-160 MG tablet Take 1 tablet by mouth 2 (two) times daily. Patient not taking: Reported on 08/17/2015 01/18/15   Henderson Cloud, MD  traMADol (ULTRAM) 50 MG  tablet Take 1 tablet (50 mg total) by mouth every 6 (six) hours as needed. Patient not taking: Reported on 08/17/2015 07/06/14   Rolan Bucco, MD  triamcinolone cream (KENALOG) 0.1 % Apply 1 application topically 2 (two) times daily. Patient not taking: Reported on 01/13/2015 12/20/14   Ivery Quale, PA-C    Family History Family History  Problem Relation Age of Onset  . Stroke Mother   . Lung cancer Father     Social History Social History  Substance Use Topics  . Smoking status: Current Every Day Smoker    Packs/day: 0.50    Types: Cigarettes  . Smokeless tobacco: Never  Used  . Alcohol use No     Allergies   Banana and Latex   Review of Systems Review of Systems  Constitutional: Negative for appetite change and fatigue.  HENT: Negative for congestion, ear discharge and sinus pressure.   Eyes: Negative for discharge.  Respiratory: Negative for cough.   Cardiovascular: Negative for chest pain.  Gastrointestinal: Negative for abdominal pain and diarrhea.  Genitourinary: Positive for dysuria. Negative for frequency and hematuria.  Musculoskeletal: Negative for back pain.  Skin: Negative for rash.  Neurological: Negative for seizures and headaches.  Psychiatric/Behavioral: Negative for hallucinations.     Physical Exam Updated Vital Signs BP (!) 137/102 (BP Location: Right Arm)   Pulse 92   Temp 98.6 F (37 C) (Oral)   Resp 16   Ht  (1.499 m)   Wt 155 lb 9 oz (70.6 kg)   LMP 08/02/2010   SpO2 96%   BMI 31.42 kg/m   Physical Exam  Constitutional: She is oriented to person, place, and time. She appears well-developed.  HENT:  Head: Normocephalic.  Eyes: Conjunctivae and EOM are normal. No scleral icterus.  Neck: Neck supple. No thyromegaly present.  Cardiovascular: Normal rate and regular rhythm.  Exam reveals no gallop and no friction rub.   No murmur heard. Pulmonary/Chest: No stridor. She has no wheezes. She has no rales. She exhibits no tenderness.  Abdominal: She exhibits no distension. There is no tenderness. There is no rebound.  Musculoskeletal: Normal range of motion. She exhibits no edema.  Lymphadenopathy:    She has no cervical adenopathy.  Neurological: She is oriented to person, place, and time. She exhibits normal muscle tone. Coordination normal.  Skin: No rash noted. No erythema.  Psychiatric: She has a normal mood and affect. Her behavior is normal.     ED Treatments / Results  Labs (all labs ordered are listed, but only abnormal results are displayed) Labs Reviewed  BASIC METABOLIC PANEL - Abnormal;  Notable for the following:       Result Value   Sodium 131 (*)    Chloride 99 (*)    Glucose, Bld 477 (*)    BUN 24 (*)    All other components within normal limits  URINALYSIS, ROUTINE W REFLEX MICROSCOPIC (NOT AT [facility]) - Abnormal; Notable for the following:    APPearance HAZY (*)    Glucose, UA >1000 (*)    Hgb urine dipstick MODERATE (*)    Ketones, ur TRACE (*)    Leukocytes, UA SMALL (*)    All other components within normal limits  URINE MICROSCOPIC-ADD ON - Abnormal; Notable for the following:    Squamous Epithelial / LPF 6-30 (*)    Bacteria, UA FEW (*)    All other components within normal limits  CBG MONITORING, ED - Abnormal; Notable for the following:  Glucose-Capillary 537 (*)    All other components within normal limits  CBG MONITORING, ED - Abnormal; Notable for the following:    Glucose-Capillary 331 (*)    All other components within normal limits  CBG MONITORING, ED - Abnormal; Notable for the following:    Glucose-Capillary 226 (*)    All other components within normal limits  URINE CULTURE  CBC    EKG  EKG Interpretation None       Radiology No results found.  Procedures Procedures (including critical care time)  Medications Ordered in ED Medications  cephALEXin (KEFLEX) capsule 500 mg (not administered)  fluconazole (DIFLUCAN) tablet 200 mg (not administered)  insulin aspart protamine- aspart (NOVOLOG MIX 70/30) injection 35 Units (not administered)  sodium chloride 0.9 % bolus 1,000 mL (0 mLs Intravenous Stopped 08/17/15 1818)  insulin aspart (novoLOG) injection 15 Units (15 Units Subcutaneous Given 08/17/15 1643)     Initial Impression / Assessment and Plan / ED Course  I have reviewed the triage vital signs and the nursing notes.  Pertinent labs & imaging results that were available during my care of the patient were reviewed by me and considered in my medical decision making (see chart for details).  Clinical Course    Patient has  elevated blood sugar which was controlled with insulin and fluids. She also had urinary tract infection she will be treated with Keflex and a yeast infection is treated with Diflucan. She is to follow-up with her PCP next week and she is to get her insulin tomorrow which she's been out of her while  Final Clinical Impressions(s) / ED Diagnoses   Final diagnoses:  UTI (lower urinary tract infection)    New Prescriptions New Prescriptions   CEPHALEXIN (KEFLEX) 500 MG CAPSULE    Take 1 capsule (500 mg total) by mouth 4 (four) times daily.     Bethann Berkshire, MD 08/17/15 2043

## 2015-08-17 NOTE — ED Notes (Signed)
CBG 226 

## 2015-08-17 NOTE — Discharge Instructions (Signed)
Get your insulin tomorrow. Follow-up with your doctor next week

## 2015-08-17 NOTE — ED Notes (Signed)
POC cbg in triage 537.

## 2015-08-17 NOTE — ED Notes (Signed)
Having painful urination for last 3 week.  C/o blood in urine this am.  C/o foul smelling urine with white discharge.  C/o vaginal itching.

## 2015-08-17 NOTE — ED Triage Notes (Addendum)
Pt reports urinary frequency hematuria x1 month. Pt reports "my sugar and blood pressure has also been high for quite awhile." pt reports ran out of metformin x1 month and bp medication x2 months. Pt denies any pain. nad noted. Pt alert and oriented, ambulatory in triage.

## 2015-08-19 LAB — URINE CULTURE

## 2016-02-15 ENCOUNTER — Emergency Department (HOSPITAL_COMMUNITY): Payer: Self-pay

## 2016-02-15 ENCOUNTER — Encounter (HOSPITAL_COMMUNITY): Payer: Self-pay | Admitting: Emergency Medicine

## 2016-02-15 ENCOUNTER — Emergency Department (HOSPITAL_COMMUNITY)
Admission: EM | Admit: 2016-02-15 | Discharge: 2016-02-15 | Disposition: A | Discharge status: Home / Self Care | Payer: Self-pay | Attending: Emergency Medicine | Admitting: Emergency Medicine

## 2016-02-15 DIAGNOSIS — I509 Heart failure, unspecified: Secondary | ICD-10-CM | POA: Insufficient documentation

## 2016-02-15 DIAGNOSIS — J441 Chronic obstructive pulmonary disease with (acute) exacerbation: Secondary | ICD-10-CM | POA: Insufficient documentation

## 2016-02-15 DIAGNOSIS — E119 Type 2 diabetes mellitus without complications: Secondary | ICD-10-CM | POA: Insufficient documentation

## 2016-02-15 DIAGNOSIS — I10 Essential (primary) hypertension: Secondary | ICD-10-CM

## 2016-02-15 DIAGNOSIS — I110 Hypertensive heart disease with heart failure: Secondary | ICD-10-CM | POA: Insufficient documentation

## 2016-02-15 DIAGNOSIS — Z794 Long term (current) use of insulin: Secondary | ICD-10-CM | POA: Insufficient documentation

## 2016-02-15 DIAGNOSIS — Z91040 Latex allergy status: Secondary | ICD-10-CM | POA: Insufficient documentation

## 2016-02-15 DIAGNOSIS — F17210 Nicotine dependence, cigarettes, uncomplicated: Secondary | ICD-10-CM | POA: Insufficient documentation

## 2016-02-15 DIAGNOSIS — Z955 Presence of coronary angioplasty implant and graft: Secondary | ICD-10-CM | POA: Insufficient documentation

## 2016-02-15 LAB — BASIC METABOLIC PANEL
Anion gap: 11 (ref 5–15)
BUN: 19 mg/dL (ref 6–20)
CO2: 26 mmol/L (ref 22–32)
Calcium: 9.2 mg/dL (ref 8.9–10.3)
Chloride: 101 mmol/L (ref 101–111)
Creatinine, Ser: 1.03 mg/dL — ABNORMAL HIGH (ref 0.44–1.00)
GFR calc Af Amer: >60 mL/min (ref >60)
GFR calc non Af Amer: >60 mL/min (ref >60)
Glucose, Bld: 206 mg/dL — ABNORMAL HIGH (ref 65–99)
Potassium: 3.5 mmol/L (ref 3.5–5.1)
Sodium: 138 mmol/L (ref 135–145)

## 2016-02-15 LAB — CBC WITH DIFFERENTIAL/PLATELET
Basophils Absolute: 0.2 10*3/uL — ABNORMAL HIGH (ref 0.0–0.1)
Basophils Relative: 2 %
Eosinophils Absolute: 0.1 10*3/uL (ref 0.0–0.7)
Eosinophils Relative: 1 %
HCT: 43.6 % (ref 36.0–46.0)
Hemoglobin: 14.7 g/dL (ref 12.0–15.0)
Lymphocytes Relative: 44 %
Lymphs Abs: 4.1 10*3/uL — ABNORMAL HIGH (ref 0.7–4.0)
MCH: 30.2 pg (ref 26.0–34.0)
MCHC: 33.7 g/dL (ref 30.0–36.0)
MCV: 89.7 fL (ref 78.0–100.0)
Monocytes Absolute: 0.7 10*3/uL (ref 0.1–1.0)
Monocytes Relative: 7 %
Neutro Abs: 4.2 10*3/uL (ref 1.7–7.7)
Neutrophils Relative %: 46 %
Platelets: 219 10*3/uL (ref 150–400)
RBC: 4.86 MIL/uL (ref 3.87–5.11)
RDW: 14.7 % (ref 11.5–15.5)
WBC: 9.3 10*3/uL (ref 4.0–10.5)

## 2016-02-15 MED ORDER — PREDNISONE 20 MG PO TABS: 60.0000 mg | ORAL_TABLET | Freq: Every day | ORAL | 0 refills | Status: DC

## 2016-02-15 MED ORDER — PREDNISONE 20 MG PO TABS
60.0000 mg | ORAL_TABLET | Freq: Once | ORAL | Status: AC
  Administered 2016-02-15: 60 mg via ORAL
  Filled 2016-02-15: qty 3

## 2016-02-15 MED ORDER — BENZONATATE 100 MG PO CAPS: 100.0000 mg | ORAL_CAPSULE | Freq: Three times a day (TID) | ORAL | 0 refills | Status: DC

## 2016-02-15 MED ORDER — IPRATROPIUM-ALBUTEROL 0.5-2.5 (3) MG/3ML IN SOLN
3.0000 mL | Freq: Once | RESPIRATORY_TRACT | Status: AC
  Administered 2016-02-15: 3 mL via RESPIRATORY_TRACT
  Filled 2016-02-15: qty 3

## 2016-02-15 MED ORDER — AZITHROMYCIN 250 MG PO TABS: 250.0000 mg | ORAL_TABLET | Freq: Every day | ORAL | 0 refills | Status: DC

## 2016-02-15 MED ORDER — LISINOPRIL-HYDROCHLOROTHIAZIDE 20-12.5 MG PO TABS: 1.0000 | ORAL_TABLET | Freq: Every day | ORAL | 0 refills | Status: DC

## 2016-02-15 MED ORDER — ALBUTEROL SULFATE HFA 108 (90 BASE) MCG/ACT IN AERS
1.0000 | INHALATION_SPRAY | Freq: Once | RESPIRATORY_TRACT | Status: AC
  Administered 2016-02-15: 1 via RESPIRATORY_TRACT
  Filled 2016-02-15: qty 6.7

## 2016-02-15 NOTE — ED Notes (Signed)
Patient transported to X-ray 

## 2016-02-15 NOTE — ED Triage Notes (Addendum)
Pt reports productive cough since Monday, chest congestion, R ear pain and CP only with cough/movement. Also feels SOB, but speaking in full sentence with no apparent difficulty.

## 2016-02-15 NOTE — ED Notes (Signed)
Patient ambulated down hallway. O2 lowest sat was 92%. Pt reports feeling much better and denies SOB.

## 2016-02-15 NOTE — ED Notes (Signed)
Pt reports not taking BP medication today due to running out a while ago.

## 2016-02-15 NOTE — Discharge Instructions (Signed)
Medications: albuterol inhaler, prednisone, Zithromax, Tessalon  Treatment: Use inhaler every 4-6 hours as needed for wheezing, shortness of breath, and/or cough. Take prednisone as prescribed for the next 4 days. Take Zithromax as prescribed for 5 days. Make sure to finish all of the Zithromax. Take Tessalon every 8 hours as needed for cough.   Follow-up: Please follow up with your primary care provider for follow up and further evaluation and treatment of your symptoms and recheck of your blood pressure.

## 2016-02-15 NOTE — ED Provider Notes (Signed)
[facility]-EMERGENCY DEPT Provider Note   CSN: [id number] Arrival date & time: 02/15/16  1428  By signing my name below, I, Modena Jansky, attest that this documentation has been prepared under the direction and in the presence of non-physician practitioner, Glenford Bayley, PA-C. Electronically Signed: Modena Jansky, Scribe. 02/15/2016. 8:28 PM.  History   Chief Complaint Chief Complaint  Patient presents with  . Cough  . Otalgia   The history is provided by the patient. No language interpreter was used.   HPI Comments: Felicia [name] is a 56 y.o. female with a PMHx of COPD who presents to the Emergency Department complaining of intermittent moderate cough that started about 3 days ago. She states she has been having gradually worsening URI-like symptoms. She has been using her albuterol inhaler with minimal relief. She describes the cough as productive of yellow phlegm (similar to prior COPD exacerbations). She reports associated symptoms of congestion (nasal/congestion), vomiting (post-tussive), SOB, wheezing, and chest pain (with cough). She denies any recent travel, recent surgeries, use of estrogen supplements, hx of PE/DVT, use of oxygen therapy at home, fever, or leg swelling.     PCP: MUSE,ROCHELLE D., PA-C  Past Medical History:  Diagnosis Date  . Cardiomyopathy   . CHF (congestive heart failure) (HCC)    minamal  . COPD (chronic obstructive pulmonary disease) (HCC)   . DDD (degenerative disc disease), lumbar   . Diabetes mellitus   . Hypertension   . Non compliance w medication regimen   . Normal echocardiogram    LVEF 25-30% 03/27/2010    Patient Active Problem List   Diagnosis Date Noted  . Hyperglycemia 01/13/2015  . Back abscess 01/13/2015  . Uncontrolled diabetes mellitus (HCC) 01/13/2015  . Hypertension 05/31/2011  . Nonischemic cardiomyopathy (HCC) 04/11/2010  . Diabetes mellitus type I (HCC) 04/11/2010    Past Surgical History:  Procedure Laterality Date  .  CARDIAC CATHETERIZATION    . TUBAL LIGATION      OB History    Gravida Para Term Preterm AB Living   7 5 5   2 5    SAB TAB Ectopic Multiple Live Births   2               Home Medications    Prior to Admission medications   Medication Sig Start Date End Date Taking? Authorizing Provider  acetaminophen (TYLENOL) 500 MG tablet Take 1,500 mg by mouth daily as needed for mild pain or headache.   Yes Historical Provider, MD  insulin NPH-regular Human (NOVOLIN 70/30) (70-30) 100 UNIT/ML injection Inject 35 Units into the skin 2 (two) times daily.   Yes Historical Provider, MD  Multiple Vitamin (MULTIVITAMIN WITH MINERALS) TABS tablet Take 1 tablet by mouth daily.   Yes Historical Provider, MD  azithromycin (ZITHROMAX) 250 MG tablet Take 1 tablet (250 mg total) by mouth daily. Take first 2 tablets together, then 1 every day until finished. 02/15/16   Emi Holes, PA-C  benzonatate (TESSALON) 100 MG capsule Take 1 capsule (100 mg total) by mouth every 8 (eight) hours. 02/15/16   Emi Holes, PA-C  cephALEXin (KEFLEX) 500 MG capsule Take 1 capsule (500 mg total) by mouth 4 (four) times daily. Patient not taking: Reported on 02/15/2016 08/17/15   Bethann Berkshire, MD  insulin aspart protamine- aspart (NOVOLOG MIX 70/30) (70-30) 100 UNIT/ML injection Inject 0.35 mLs (35 Units total) into the skin 2 (two) times daily with a meal. Patient not taking: Reported on 02/15/2016 01/18/15   Minerva Ends  Isaiah Blakes, MD  lisinopril-hydrochlorothiazide (ZESTORETIC) 20-12.5 MG tablet Take 1 tablet by mouth daily. 02/15/16   Emi Holes, PA-C  metFORMIN (GLUCOPHAGE) 1000 MG tablet Take 1 tablet (1,000 mg total) by mouth 2 (two) times daily. Patient not taking: Reported on 08/17/2015 01/08/15   Zadie Rhine, MD  potassium chloride SA (K-DUR,KLOR-CON) 20 MEQ tablet Take 2 tablets (40 mEq total) by mouth every 4 (four) hours. Patient not taking: Reported on 08/17/2015 01/18/15   Henderson Cloud, MD    predniSONE (DELTASONE) 20 MG tablet Take 3 tablets (60 mg total) by mouth daily. 02/15/16   Emi Holes, PA-C  sulfamethoxazole-trimethoprim (BACTRIM DS,SEPTRA DS) 800-160 MG tablet Take 1 tablet by mouth 2 (two) times daily. Patient not taking: Reported on 08/17/2015 01/18/15   Henderson Cloud, MD  traMADol (ULTRAM) 50 MG tablet Take 1 tablet (50 mg total) by mouth every 6 (six) hours as needed. Patient not taking: Reported on 08/17/2015 07/06/14   Rolan Bucco, MD  triamcinolone cream (KENALOG) 0.1 % Apply 1 application topically 2 (two) times daily. Patient not taking: Reported on 01/13/2015 12/20/14   Ivery Quale, PA-C    Family History Family History  Problem Relation Age of Onset  . Stroke Mother   . Lung cancer Father     Social History Social History  Substance Use Topics  . Smoking status: Current Every Day Smoker    Packs/day: 0.50    Types: Cigarettes  . Smokeless tobacco: Never Used  . Alcohol use No     Allergies   Banana and Latex   Review of Systems Review of Systems  Constitutional: Negative for chills and fever.  HENT: Positive for congestion (nasal/congestion) and ear pain (right). Negative for facial swelling and sore throat.   Respiratory: Positive for cough (productive ), shortness of breath and wheezing.   Cardiovascular: Positive for chest pain (only with cough). Negative for leg swelling.  Gastrointestinal: Positive for vomiting (post-tussive). Negative for abdominal pain and nausea.  Genitourinary: Negative for dysuria.  Musculoskeletal: Negative for back pain.  Skin: Negative for rash and wound.  Neurological: Negative for headaches.  Psychiatric/Behavioral: The patient is not nervous/anxious.      Physical Exam Updated Vital Signs BP (!) 160/101   Pulse 86   Temp 98.9 F (37.2 C) (Oral)   Resp 23   LMP 08/02/2010   SpO2 91%   Physical Exam  Constitutional: She appears well-developed and well-nourished. No distress.   HENT:  Head: Normocephalic and atraumatic.  Right Ear: Tympanic membrane normal.  Left Ear: Tympanic membrane normal.  Mouth/Throat: Oropharynx is clear and moist. No oropharyngeal exudate.  Eyes: Conjunctivae are normal. Pupils are equal, round, and reactive to light. Right eye exhibits no discharge. Left eye exhibits no discharge. No scleral icterus.  Neck: Normal range of motion. Neck supple. No thyromegaly present.  Cardiovascular: Normal rate, regular rhythm, normal heart sounds and intact distal pulses.  Exam reveals no gallop and no friction rub.   No murmur heard. Pulmonary/Chest: Effort normal. No stridor. No respiratory distress. She has wheezes (Expiratory). She has rhonchi. She has no rales.  Abdominal: Soft. Bowel sounds are normal. She exhibits no distension. There is no tenderness. There is no rebound and no guarding.  Musculoskeletal: She exhibits no edema.  Lymphadenopathy:    She has no cervical adenopathy.  Neurological: She is alert. Coordination normal.  Skin: Skin is warm and dry. No rash noted. She is not diaphoretic. No pallor.  Psychiatric: She has a normal mood and affect.  Nursing note and vitals reviewed.    ED Treatments / Results  DIAGNOSTIC STUDIES: Oxygen Saturation is 91% on RA, normal by my interpretation.    COORDINATION OF CARE: 8:32 PM- Pt advised of plan for treatment and pt agrees.  Labs (all labs ordered are listed, but only abnormal results are displayed) Labs Reviewed  CBC WITH DIFFERENTIAL/PLATELET - Abnormal; Notable for the following:       Result Value   Lymphs Abs 4.1 (*)    Basophils Absolute 0.2 (*)    All other components within normal limits  BASIC METABOLIC PANEL - Abnormal; Notable for the following:    Glucose, Bld 206 (*)    Creatinine, Ser 1.03 (*)    All other components within normal limits  PATHOLOGIST SMEAR REVIEW    EKG  EKG Interpretation  Date/Time:  Thursday February 15 2016 15:58:40 EST Ventricular Rate:   85 PR Interval:    QRS Duration: 83 QT Interval:  375 QTC Calculation: 446 R Axis:   18 Text Interpretation:  Sinus rhythm Right atrial enlargement Consider left ventricular hypertrophy Nonspecific T abnormalities, lateral leads Baseline wander in lead(s) I III aVR aVL No significant change since last tracing Confirmed by KNAPP  MD-J, JON ([id number]) on 02/16/2016 11:29:50 AM       Radiology Dg Chest 2 View  Result Date: 02/15/2016 CLINICAL DATA:  Chest pain, cough and shortness of breath since 02/11/2016. EXAM: CHEST  2 VIEW COMPARISON:  PA and lateral chest 01/11/2014. FINDINGS: There is cardiomegaly without edema. Lungs are clear. No pneumothorax or pleural effusion. No focal bony abnormality. IMPRESSION: Cardiomegaly without acute disease. Electronically Signed   By: Drusilla Kanner M.D.   On: 02/15/2016 15:58    Procedures Procedures (including critical care time)  Medications Ordered in ED Medications  ipratropium-albuterol (DUONEB) 0.5-2.5 (3) MG/3ML nebulizer solution 3 mL (3 mLs Nebulization Given 02/15/16 2046)  ipratropium-albuterol (DUONEB) 0.5-2.5 (3) MG/3ML nebulizer solution 3 mL (3 mLs Nebulization Given 02/15/16 2138)  predniSONE (DELTASONE) tablet 60 mg (60 mg Oral Given 02/15/16 2256)  albuterol (PROVENTIL HFA;VENTOLIN HFA) 108 (90 Base) MCG/ACT inhaler 1-2 puff (1 puff Inhalation Given 02/15/16 2257)     Initial Impression / Assessment and Plan / ED Course  I have reviewed the triage vital signs and the nursing notes.  Pertinent labs & imaging results that were available during my care of the patient were reviewed by me and considered in my medical decision making (see chart for details).     Patient with most likely COPD exacerbation. CBC unremarkable. BMP shows glucose 206, Cr 1.03. CXR shows cardiomegaly without acute disease. EKG shows NSR, right atrial enlargement, probably LVH, and nonspecific T wave abnormalities, but no significant change since last. Patients  symptoms and lung exam improved greatly with 2 Duonebs in the ED. Patient is feeling well and would like to be discharged home. Discharge home with prednisone, Z-pak, and albuterol inhaler. Follow up to PCP about today's exacerbation, as patient is not currently on any daily medicine for COPD. Patient also discharged home with refill of BP med. Recheck at PCP as well. Return precaution discussed. Patient understands and agrees with plan. I discussed patient case with Dr. Erma Heritage who agrees with plan.  Final Clinical Impressions(s) / ED Diagnoses   Final diagnoses:  COPD with acute exacerbation (HCC)  Hypertension, unspecified type    New Prescriptions Discharge Medication List as of 02/15/2016 11:00 PM  I personally performed the services described in this documentation, which was scribed in my presence. The recorded information has been reviewed and is accurate.     Emi Holes, PA-C 02/17/16 1610    Shaune Pollack, MD 02/17/16 1036

## 2016-02-16 LAB — PATHOLOGIST SMEAR REVIEW

## 2017-05-27 ENCOUNTER — Other Ambulatory Visit: Payer: Self-pay

## 2017-05-27 ENCOUNTER — Emergency Department (HOSPITAL_COMMUNITY): Payer: Self-pay

## 2017-05-27 ENCOUNTER — Inpatient Hospital Stay (HOSPITAL_COMMUNITY)
Admission: EM | Admit: 2017-05-27 | Discharge: 2017-05-30 | DRG: 286 | Disposition: A | Discharge status: Home / Self Care | Payer: Self-pay | Attending: Internal Medicine | Admitting: Internal Medicine

## 2017-05-27 ENCOUNTER — Encounter (HOSPITAL_COMMUNITY): Payer: Self-pay

## 2017-05-27 DIAGNOSIS — F17210 Nicotine dependence, cigarettes, uncomplicated: Secondary | ICD-10-CM | POA: Diagnosis present

## 2017-05-27 DIAGNOSIS — M5136 Other intervertebral disc degeneration, lumbar region: Secondary | ICD-10-CM | POA: Diagnosis present

## 2017-05-27 DIAGNOSIS — Z7982 Long term (current) use of aspirin: Secondary | ICD-10-CM

## 2017-05-27 DIAGNOSIS — R600 Localized edema: Secondary | ICD-10-CM | POA: Diagnosis present

## 2017-05-27 DIAGNOSIS — E1165 Type 2 diabetes mellitus with hyperglycemia: Secondary | ICD-10-CM | POA: Diagnosis present

## 2017-05-27 DIAGNOSIS — J9601 Acute respiratory failure with hypoxia: Secondary | ICD-10-CM | POA: Diagnosis present

## 2017-05-27 DIAGNOSIS — I509 Heart failure, unspecified: Secondary | ICD-10-CM

## 2017-05-27 DIAGNOSIS — I5023 Acute on chronic systolic (congestive) heart failure: Secondary | ICD-10-CM | POA: Diagnosis present

## 2017-05-27 DIAGNOSIS — Z9114 Patient's other noncompliance with medication regimen: Secondary | ICD-10-CM

## 2017-05-27 DIAGNOSIS — Z91040 Latex allergy status: Secondary | ICD-10-CM

## 2017-05-27 DIAGNOSIS — G4733 Obstructive sleep apnea (adult) (pediatric): Secondary | ICD-10-CM | POA: Diagnosis present

## 2017-05-27 DIAGNOSIS — Z79899 Other long term (current) drug therapy: Secondary | ICD-10-CM

## 2017-05-27 DIAGNOSIS — Z794 Long term (current) use of insulin: Secondary | ICD-10-CM

## 2017-05-27 DIAGNOSIS — R0902 Hypoxemia: Secondary | ICD-10-CM

## 2017-05-27 DIAGNOSIS — I110 Hypertensive heart disease with heart failure: Principal | ICD-10-CM | POA: Diagnosis present

## 2017-05-27 DIAGNOSIS — I2510 Atherosclerotic heart disease of native coronary artery without angina pectoris: Secondary | ICD-10-CM | POA: Diagnosis present

## 2017-05-27 DIAGNOSIS — I5021 Acute systolic (congestive) heart failure: Secondary | ICD-10-CM

## 2017-05-27 DIAGNOSIS — IMO0002 Reserved for concepts with insufficient information to code with codable children: Secondary | ICD-10-CM | POA: Diagnosis present

## 2017-05-27 DIAGNOSIS — I428 Other cardiomyopathies: Secondary | ICD-10-CM | POA: Diagnosis present

## 2017-05-27 DIAGNOSIS — Z720 Tobacco use: Secondary | ICD-10-CM | POA: Diagnosis present

## 2017-05-27 DIAGNOSIS — J441 Chronic obstructive pulmonary disease with (acute) exacerbation: Secondary | ICD-10-CM | POA: Diagnosis present

## 2017-05-27 DIAGNOSIS — I10 Essential (primary) hypertension: Secondary | ICD-10-CM | POA: Diagnosis present

## 2017-05-27 DIAGNOSIS — Z91018 Allergy to other foods: Secondary | ICD-10-CM

## 2017-05-27 DIAGNOSIS — E669 Obesity, unspecified: Secondary | ICD-10-CM

## 2017-05-27 DIAGNOSIS — Z6841 Body Mass Index (BMI) 40.0 and over, adult: Secondary | ICD-10-CM

## 2017-05-27 LAB — CBC
HCT: 42.2 % (ref 36.0–46.0)
HEMOGLOBIN: 13.5 g/dL (ref 12.0–15.0)
MCH: 29.4 pg (ref 26.0–34.0)
MCHC: 32 g/dL (ref 30.0–36.0)
MCV: 91.9 fL (ref 78.0–100.0)
Platelets: 253 10*3/uL (ref 150–400)
RBC: 4.59 MIL/uL (ref 3.87–5.11)
RDW: 15.9 % — ABNORMAL HIGH (ref 11.5–15.5)
WBC: 11.3 10*3/uL — AB (ref 4.0–10.5)

## 2017-05-27 LAB — I-STAT TROPONIN, ED
Troponin i, poc: 0.03 ng/mL (ref 0.00–0.08)
Troponin i, poc: 0.01 ng/mL (ref 0.00–0.08)

## 2017-05-27 LAB — BASIC METABOLIC PANEL
ANION GAP: 10 (ref 5–15)
BUN: 11 mg/dL (ref 6–20)
CO2: 27 mmol/L (ref 22–32)
Calcium: 8.8 mg/dL — ABNORMAL LOW (ref 8.9–10.3)
Chloride: 102 mmol/L (ref 101–111)
Creatinine, Ser: 0.73 mg/dL (ref 0.44–1.00)
GFR calc Af Amer: >60 mL/min (ref >60)
Glucose, Bld: 276 mg/dL — ABNORMAL HIGH (ref 65–99)
POTASSIUM: 3.6 mmol/L (ref 3.5–5.1)
SODIUM: 139 mmol/L (ref 135–145)

## 2017-05-27 LAB — BRAIN NATRIURETIC PEPTIDE: B NATRIURETIC PEPTIDE 5: 271.6 pg/mL — AB (ref 0.0–100.0)

## 2017-05-27 LAB — CBG MONITORING, ED: Glucose-Capillary: 189 mg/dL — ABNORMAL HIGH (ref 65–99)

## 2017-05-27 LAB — D-DIMER, QUANTITATIVE: D-Dimer, Quant: 0.93 ug/mL-FEU — ABNORMAL HIGH (ref 0.00–0.50)

## 2017-05-27 MED ORDER — BISMUTH SUBSALICYLATE 262 MG/15ML PO SUSP: 30.0000 mL | ORAL | Status: DC | PRN

## 2017-05-27 MED ORDER — ONDANSETRON HCL 4 MG/2ML IJ SOLN: 4.0000 mg | Freq: Four times a day (QID) | INTRAMUSCULAR | Status: DC | PRN

## 2017-05-27 MED ORDER — ADULT MULTIVITAMIN W/MINERALS CH
1.0000 | ORAL_TABLET | Freq: Every day | ORAL | Status: DC
  Administered 2017-05-28 – 2017-05-30 (×2): 1 via ORAL
  Filled 2017-05-27 (×2): qty 1

## 2017-05-27 MED ORDER — FUROSEMIDE 10 MG/ML IJ SOLN
40.0000 mg | Freq: Once | INTRAMUSCULAR | Status: AC
Start: 2017-05-27 — End: 2017-05-27
  Administered 2017-05-27: 40 mg via INTRAVENOUS
  Filled 2017-05-27: qty 4

## 2017-05-27 MED ORDER — METHYLPREDNISOLONE SODIUM SUCC 125 MG IJ SOLR
125.0000 mg | Freq: Once | INTRAMUSCULAR | Status: AC
  Administered 2017-05-27: 125 mg via INTRAVENOUS
  Filled 2017-05-27: qty 2

## 2017-05-27 MED ORDER — ASPIRIN 81 MG PO CHEW
81.0000 mg | CHEWABLE_TABLET | Freq: Every day | ORAL | Status: DC
  Administered 2017-05-28 – 2017-05-30 (×4): 81 mg via ORAL
  Filled 2017-05-27 (×4): qty 1

## 2017-05-27 MED ORDER — NYSTATIN 100000 UNIT/GM EX POWD
Freq: Three times a day (TID) | CUTANEOUS | Status: DC
  Administered 2017-05-28 (×4): via TOPICAL
  Administered 2017-05-29: 1 via TOPICAL
  Administered 2017-05-29 – 2017-05-30 (×2): via TOPICAL
  Filled 2017-05-27: qty 15

## 2017-05-27 MED ORDER — ALBUTEROL SULFATE (2.5 MG/3ML) 0.083% IN NEBU
5.0000 mg | INHALATION_SOLUTION | Freq: Once | RESPIRATORY_TRACT | Status: AC
  Administered 2017-05-27: 5 mg via RESPIRATORY_TRACT
  Filled 2017-05-27: qty 6

## 2017-05-27 MED ORDER — IOPAMIDOL (ISOVUE-370) INJECTION 76%
INTRAVENOUS | Status: AC
  Filled 2017-05-27: qty 100

## 2017-05-27 MED ORDER — IPRATROPIUM-ALBUTEROL 0.5-2.5 (3) MG/3ML IN SOLN
3.0000 mL | Freq: Once | RESPIRATORY_TRACT | Status: AC
  Administered 2017-05-27: 3 mL via RESPIRATORY_TRACT
  Filled 2017-05-27: qty 3

## 2017-05-27 MED ORDER — ACETAMINOPHEN 325 MG PO TABS: 650.0000 mg | ORAL_TABLET | Freq: Four times a day (QID) | ORAL | Status: DC | PRN

## 2017-05-27 MED ORDER — ALBUTEROL SULFATE (2.5 MG/3ML) 0.083% IN NEBU: 2.5000 mg | INHALATION_SOLUTION | RESPIRATORY_TRACT | Status: DC | PRN

## 2017-05-27 MED ORDER — SODIUM CHLORIDE 0.9% FLUSH
3.0000 mL | Freq: Two times a day (BID) | INTRAVENOUS | Status: DC
  Administered 2017-05-28 – 2017-05-29 (×4): 3 mL via INTRAVENOUS

## 2017-05-27 MED ORDER — IOPAMIDOL (ISOVUE-370) INJECTION 76%
100.0000 mL | Freq: Once | INTRAVENOUS | Status: AC | PRN
  Administered 2017-05-27: 100 mL via INTRAVENOUS

## 2017-05-27 MED ORDER — HYDRALAZINE HCL 20 MG/ML IJ SOLN
10.0000 mg | INTRAMUSCULAR | Status: DC | PRN
  Administered 2017-05-27: 10 mg via INTRAVENOUS
  Filled 2017-05-27: qty 1

## 2017-05-27 MED ORDER — METHYLPREDNISOLONE SODIUM SUCC 40 MG IJ SOLR
40.0000 mg | Freq: Four times a day (QID) | INTRAMUSCULAR | Status: DC
  Administered 2017-05-28 (×3): 40 mg via INTRAVENOUS
  Filled 2017-05-27 (×3): qty 1

## 2017-05-27 MED ORDER — IPRATROPIUM-ALBUTEROL 0.5-2.5 (3) MG/3ML IN SOLN
3.0000 mL | Freq: Four times a day (QID) | RESPIRATORY_TRACT | Status: DC
  Administered 2017-05-27: 3 mL via RESPIRATORY_TRACT
  Filled 2017-05-27: qty 3

## 2017-05-27 MED ORDER — DIPHENHYDRAMINE HCL 25 MG PO TABS
25.0000 mg | ORAL_TABLET | Freq: Three times a day (TID) | ORAL | Status: DC | PRN
  Filled 2017-05-27: qty 1

## 2017-05-27 MED ORDER — PREDNISONE 20 MG PO TABS: 40.0000 mg | ORAL_TABLET | Freq: Every day | ORAL | Status: DC

## 2017-05-27 MED ORDER — ACETAMINOPHEN 650 MG RE SUPP: 650.0000 mg | Freq: Four times a day (QID) | RECTAL | Status: DC | PRN

## 2017-05-27 MED ORDER — ONDANSETRON HCL 4 MG PO TABS: 4.0000 mg | ORAL_TABLET | Freq: Four times a day (QID) | ORAL | Status: DC | PRN

## 2017-05-27 MED ORDER — ENOXAPARIN SODIUM 40 MG/0.4ML ~~LOC~~ SOLN
40.0000 mg | SUBCUTANEOUS | Status: DC
  Administered 2017-05-28 (×2): 40 mg via SUBCUTANEOUS
  Filled 2017-05-27 (×2): qty 0.4

## 2017-05-27 MED ORDER — INSULIN ASPART PROT & ASPART (70-30 MIX) 100 UNIT/ML ~~LOC~~ SUSP
35.0000 [IU] | Freq: Two times a day (BID) | SUBCUTANEOUS | Status: DC
  Administered 2017-05-28 (×2): 35 [IU] via SUBCUTANEOUS
  Filled 2017-05-27 (×2): qty 10

## 2017-05-27 MED ORDER — NICOTINE 21 MG/24HR TD PT24
21.0000 mg | MEDICATED_PATCH | Freq: Every day | TRANSDERMAL | Status: DC
  Administered 2017-05-28 – 2017-05-29 (×3): 21 mg via TRANSDERMAL
  Filled 2017-05-27 (×3): qty 1

## 2017-05-27 NOTE — ED Notes (Signed)
Patient transported to CT 

## 2017-05-27 NOTE — ED Notes (Signed)
Pt endorses sob, normally uses inhaler.

## 2017-05-27 NOTE — ED Notes (Signed)
Pt noted to have saturations at 90% on 2L [place] with good pleth. Pt oxygen increased to 3L [place] and saturations increased to 95%. MD notified.

## 2017-05-27 NOTE — ED Triage Notes (Signed)
Pt reports shortness of breath for a couple of weeks. She reports onset of chest pain last night. Hx of COPD. SPO2 noted to be between 88 and 91% on room air in triage. Chest pain is central and radiates to her back, worse with deep breathing.

## 2017-05-27 NOTE — ED Notes (Addendum)
Meal Tray ordered at 19:22

## 2017-05-27 NOTE — H&P (Signed)
History and Physical   Felicia [name] ZDG:[id number] DOB: August 29, 1960 DOA: 05/27/2017  Referring MD/NP/PA: Dr. Rush Landmark, EDP PCP: Tylene Fantasia., PA-C Outpatient Specialists: [facility]  Patient coming from: Home  Chief Complaint: Shortness of breath  HPI: Felicia [name] is a 57 y.o. female with a history of obesity, tobacco use, COPD, IDDM, HTN and NICM with most recently improved EF to 50-55% who presented to the ED with 2 weeks of shortness of breath and wheezing. She reported initially mild but gradually worsening to severe, constant shortness of breath at rest and with exertion in addition to some chest tightness which is worse with deep or rapid breathing, radiating to the back. She's noticed her breathing is better when sitting more upright, and thinks her legs may be swollen. Doesn't weigh herself. Hasn't taken any medications today aside from her inhaler which only barely helped, prompting her ED visit.    ED Course: Hypoxic requiring 3L O2 by nasal cannula, wheezing and crackles on exam, afebrile. Troponin negative, BNP elevated. D-dimer also elevated and subsequent CTA chest demonstrated cardiomegaly, scattered atelectasis, and mild edema. Respiratory status improved with steroids and nebulizer therapies, hospitalists called to admit.  Review of Systems: No fever, chills, weight loss or gain, palpitations, abd pain, N/V/D, and per HPI. All others reviewed and are negative.   Past Medical History:  Diagnosis Date  . Cardiomyopathy   . CHF (congestive heart failure) (HCC)    minamal  . COPD (chronic obstructive pulmonary disease) (HCC)   . DDD (degenerative disc disease), lumbar   . Diabetes mellitus   . Hypertension   . Non compliance w medication regimen   . Normal echocardiogram    LVEF 25-30% 03/27/2010   Past Surgical History:  Procedure Laterality Date  . CARDIAC CATHETERIZATION    . TUBAL LIGATION     - "I quit smoking... today." Argues with son but overall  sounds like about 1 ppd habit, no significant EtOH.   reports that she has been smoking cigarettes.  She has been smoking about 0.50 packs per day. She has never used smokeless tobacco. She reports that she does not drink alcohol or use drugs. Allergies  Allergen Reactions  . Banana Itching  . Latex Itching   Family History  Problem Relation Age of Onset  . Stroke Mother   . Lung cancer Father    - Family history otherwise reviewed and not pertinent.  Prior to Admission medications   Medication Sig Start Date End Date Taking? Authorizing Provider  acetaminophen (TYLENOL) 500 MG tablet Take 1,500 mg by mouth daily as needed for mild pain or headache.   Yes [provider]  aspirin 81 MG chewable tablet Chew 81 mg by mouth daily. 04/16/16  Yes [provider]  bismuth subsalicylate (PEPTO BISMOL) 262 MG/15ML suspension Take 30 mLs by mouth as needed for indigestion.   Yes [provider]  diphenhydrAMINE (BENADRYL) 25 MG tablet Take 25 mg by mouth as needed for allergies.   Yes [provider]  diphenhydramine-acetaminophen (TYLENOL PM EXTRA STRENGTH) 25-500 MG TABS tablet Take 2 tablets by mouth at bedtime as needed.   Yes [provider]  ibuprofen (ADVIL,MOTRIN) 200 MG tablet Take 800 mg by mouth as needed for moderate pain.   Yes [provider]  insulin NPH-regular Human (NOVOLIN 70/30) (70-30) 100 UNIT/ML injection Inject 35 Units into the skin 2 (two) times daily.   Yes [provider]  Multiple Vitamin (MULTIVITAMIN WITH MINERALS) TABS tablet  Take 1 tablet by mouth daily.   Yes [provider]  insulin aspart protamine- aspart (NOVOLOG MIX 70/30) (70-30) 100 UNIT/ML injection Inject 0.35 mLs (35 Units total) into the skin 2 (two) times daily with a meal. Patient not taking: Reported on 02/15/2016 01/18/15   Philip Aspen, Limmie Felicia, MD  lisinopril-hydrochlorothiazide (ZESTORETIC) 20-12.5 MG tablet Take 1 tablet by  mouth daily. Patient not taking: Reported on 05/27/2017 02/15/16   Emi Holes, PA-C  metFORMIN (GLUCOPHAGE) 1000 MG tablet Take 1 tablet (1,000 mg total) by mouth 2 (two) times daily. Patient not taking: Reported on 08/17/2015 01/08/15   Zadie Rhine, MD  potassium chloride SA (K-DUR,KLOR-CON) 20 MEQ tablet Take 2 tablets (40 mEq total) by mouth every 4 (four) hours. Patient not taking: Reported on 08/17/2015 01/18/15   Philip Aspen, Limmie Margart, MD  sulfamethoxazole-trimethoprim (BACTRIM DS,SEPTRA DS) 800-160 MG tablet Take 1 tablet by mouth 2 (two) times daily. Patient not taking: Reported on 08/17/2015 01/18/15   Philip Aspen, Limmie Wilmina, MD  traMADol (ULTRAM) 50 MG tablet Take 1 tablet (50 mg total) by mouth every 6 (six) hours as needed. Patient not taking: Reported on 08/17/2015 07/06/14   Rolan Bucco, MD  triamcinolone cream (KENALOG) 0.1 % Apply 1 application topically 2 (two) times daily. Patient not taking: Reported on 01/13/2015 12/20/14   Ivery Quale, PA-C    Physical Exam: Vitals:   05/27/17 1600 05/27/17 1700 05/27/17 1800 05/27/17 1815  BP: (!) 179/116 (!) 145/65    Pulse: (!) 111 (!) 105 (!) 108 (!) 106  Resp:    19  Temp:      TempSrc:      SpO2: 95% 92% 94% 92%   Constitutional: 57 y.o. female in no distress, calm demeanor Eyes: Lids and conjunctivae normal, PERRL, right medial strabismus. ENMT: Mucous membranes are moist. Posterior pharynx clear of any exudate or lesions. Fair dentition.  Neck: normal, supple, no masses, no thyromegaly Respiratory: Non-labored tachypnea without accessory muscle use. Faint crackles at bases but no wheezing on my exam. Pushes for air at the end of longer sentences.  Cardiovascular: Regular rate and rhythm, no murmurs, rubs, or gallops. No carotid bruits. No JVD. Trace LE edema. 2+ pedal pulses. Abdomen: Normoactive bowel sounds. No tenderness, non-distended, and no masses palpated. No hepatosplenomegaly. GU: No indwelling  catheter Musculoskeletal: No clubbing / cyanosis. No joint deformity upper and lower extremities. Good ROM, no contractures. Normal muscle tone.  Skin: Warm, dry. No rashes, wounds, or ulcers. +Acanthosis  Neurologic: Strabismus as above, otherwise CN II-XII grossly intact. Gait not assessed. Speech normal. No focal deficits in motor strength or sensation in all extremities.  Psychiatric: Alert and oriented x3. Normal judgment and insight. Mood euthymic with congruent affect.   Labs on Admission: I have personally reviewed following labs and imaging studies  CBC: Recent Labs  Lab 05/27/17 0918  WBC 11.3*  HGB 13.5  HCT 42.2  MCV 91.9  PLT 253   Basic Metabolic Panel: Recent Labs  Lab 05/27/17 0918  NA 139  K 3.6  CL 102  CO2 27  GLUCOSE 276*  BUN 11  CREATININE 0.73  CALCIUM 8.8*   GFR: CrCl cannot be calculated (Unknown ideal weight.). Liver Function Tests: No results for input(s): AST, ALT, ALKPHOS, BILITOT, PROT, ALBUMIN in the last 168 hours. No results for input(s): LIPASE, AMYLASE in the last 168 hours. No results for input(s): AMMONIA in the last 168 hours. Coagulation Profile: No results for input(s): INR, PROTIME in  the last 168 hours. Cardiac Enzymes: No results for input(s): CKTOTAL, CKMB, CKMBINDEX, TROPONINI in the last 168 hours. BNP (last 3 results) No results for input(s): PROBNP in the last 8760 hours. HbA1C: No results for input(s): HGBA1C in the last 72 hours. CBG: Recent Labs  Lab 05/27/17 1211  GLUCAP 189*   Lipid Profile: No results for input(s): CHOL, HDL, LDLCALC, TRIG, CHOLHDL, LDLDIRECT in the last 72 hours. Thyroid Function Tests: No results for input(s): TSH, T4TOTAL, FREET4, T3FREE, THYROIDAB in the last 72 hours. Anemia Panel: No results for input(s): VITAMINB12, FOLATE, FERRITIN, TIBC, IRON, RETICCTPCT in the last 72 hours. Urine analysis:    Component Value Date/Time   COLORURINE YELLOW 08/17/2015 1615   APPEARANCEUR HAZY  (A) 08/17/2015 1615   LABSPEC 1.010 08/17/2015 1615   PHURINE 5.0 08/17/2015 1615   GLUCOSEU >1000 (A) 08/17/2015 1615   HGBUR MODERATE (A) 08/17/2015 1615   BILIRUBINUR NEGATIVE 08/17/2015 1615   KETONESUR TRACE (A) 08/17/2015 1615   PROTEINUR NEGATIVE 08/17/2015 1615   UROBILINOGEN 0.2 07/06/2014 1342   NITRITE NEGATIVE 08/17/2015 1615   LEUKOCYTESUR SMALL (A) 08/17/2015 1615    No results found for this or any previous visit (from the past 240 hour(s)).   Radiological Exams on Admission: Dg Chest 2 View  Result Date: 05/27/2017 CLINICAL DATA:  Two weeks of shortness of breath. Onset of mid chest pain last night radiating to the back with pleuritic component. History of COPD, cardiomyopathy, diabetes, current smoker. EXAM: CHEST - 2 VIEW COMPARISON:  PA and lateral chest x-ray of February 15, 2016 FINDINGS: The lungs are well-expanded. The interstitial markings are increased. The cardiac silhouette is enlarged. The pulmonary vascularity is engorged. There is no pleural effusion. The bony thorax exhibits no acute abnormality. IMPRESSION: CHF superimposed upon COPD.  No alveolar pneumonia. Electronically Signed   By: David  [place] M.D.   On: 05/27/2017 09:27   Ct Angio Chest Pe W And/or Wo Contrast  Result Date: 05/27/2017 CLINICAL DATA:  Centralized chest pain.  Shortness of breath. EXAM: CT ANGIOGRAPHY CHEST WITH CONTRAST TECHNIQUE: Multidetector CT imaging of the chest was performed using the standard protocol during bolus administration of intravenous contrast. Multiplanar CT image reconstructions and MIPs were obtained to evaluate the vascular anatomy. CONTRAST:  ISOVUE-370 IOPAMIDOL (ISOVUE-370) INJECTION 76% COMPARISON:  Chest CT Jun 18, 2010.  Chest x-ray May 27, 2017. FINDINGS: Cardiovascular: Cardiomegaly. Coronary artery calcifications. The thoracic aorta is nonaneurysmal with no dissection. No significant atherosclerotic change. The central pulmonary arteries are normal in  caliber. No pulmonary emboli. Mediastinum/Nodes: The thyroid and esophagus are normal. No adenopathy. A few shotty nodes in the retrocrural space on axial image 66 of in stable since 2012, of no acute significance. No effusions. No chest wall abnormalities identified. Lungs/Pleura: The central airways are normal. No pneumothorax. Linear opacity in the lateral right lung such as on series 7, image 47 and coronal image 64 is likely atelectasis given its appearance. Bibasilar atelectasis identified. Mild edema. No infiltrate to suggest pneumonia. There is a nodule in the left base on series 7, image 51 measuring 6 mm today, unchanged since 2012, requiring no follow-up. A nodule in the right base on series 7, image 39 is stable as well. No new nodules or masses. Upper Abdomen: No acute abnormality. Musculoskeletal: No chest wall abnormality. No acute or significant osseous findings. Review of the MIP images confirms the above findings. IMPRESSION: 1. Cardiomegaly, scattered atelectasis, and mild edema. No convincing evidence of pneumonia. 2. No  pulmonary emboli identified. 3. No other acute abnormalities. Electronically Signed   By: Gerome Sam III M.D   On: 05/27/2017 16:48    EKG: Independently reviewed. Sinus tachycardia, nonspecific T wave changes without ST deviation.  Assessment/Plan Acute respiratory failure with hypoxia: Due to COPD exacerbation, possible pulmonary edema.  - Continue O2 prn - Treat as below  COPD with acute exacerbation: Improved in ED. - Continue IV steroids, scheduled and prn breathing treatments - CM consulted for PCP and will likely benefit from having DME nebulizer machine. - No significant cough/sputum, so holding abx.  Tobacco use:  - 21mg  patch.  - Pt contemplative, recommend PCP follow up for ongoing cessation counseling, resources.   Nonischemic cardiomyopathy: With last echo showing improvement in EF to 50-55% (from 25-30% in 2012), mild septal hypokinesis.  -  Update echo - Give lasix x1 now given edema on CT and crackles on exam - Strict I/O, daily weights  IDDM: No recent HbA1c.  - Check HbA1c, lipid panel - Give home dose insulin as pt denies any lows and is taking normal po. - Consider starting ACE inhibitor, statin prior to discharge. Continue aspirin.  HTN:  - Would benefit from ACE inhibitor due to diabetes. If not needing ongoing diuresis, would start 5/8.  - Hydralazine IV prn   DVT prophylaxis: Lovenox  Code Status: Full  Family Communication: Children at bedside Disposition Plan: Home when improved Consults called: None  Admission status: Inpatient    Tyrone Nine, MD [facility] Pager [phone]  If 7PM-7AM, please contact night-coverage [email or web address] Password [facility] 05/27/2017, 6:56 PM

## 2017-05-27 NOTE — ED Notes (Signed)
Pt reports feeling much better post breathing tx.

## 2017-05-27 NOTE — ED Provider Notes (Signed)
[facility] EMERGENCY DEPARTMENT Provider Note   CSN: [id number] Arrival date & time: 05/27/17  4098     History   Chief Complaint Chief Complaint  Patient presents with  . Chest Pain    HPI Felicia [name] is a 57 y.o. female.  The history is provided by the patient, a relative and medical records.  Chest Pain   This is a new problem. The current episode started yesterday. The problem occurs constantly. The problem has been gradually worsening. The pain is associated with exertion, breathing and walking. The pain is present in the substernal region and lateral region. The pain is at a severity of 8/10. The pain is moderate. The quality of the pain is described as exertional, pleuritic, heavy and pressure-like. The pain radiates to the upper back. Duration of episode(s) is 2 days. The symptoms are aggravated by deep breathing and exertion. Associated symptoms include back pain, cough, exertional chest pressure, lower extremity edema (mild), malaise/fatigue and shortness of breath. Pertinent negatives include no abdominal pain, no diaphoresis, no dizziness, no fever, no headaches, no irregular heartbeat, no leg pain, no nausea, no near-syncope, no numbness, no palpitations, no sputum production, no syncope and no vomiting. She has tried nothing for the symptoms. The treatment provided no relief.    Past Medical History:  Diagnosis Date  . Cardiomyopathy   . CHF (congestive heart failure) (HCC)    minamal  . COPD (chronic obstructive pulmonary disease) (HCC)   . DDD (degenerative disc disease), lumbar   . Diabetes mellitus   . Hypertension   . Non compliance w medication regimen   . Normal echocardiogram    LVEF 25-30% 03/27/2010    Patient Active Problem List   Diagnosis Date Noted  . Hyperglycemia 01/13/2015  . Back abscess 01/13/2015  . Uncontrolled diabetes mellitus (HCC) 01/13/2015  . Hypertension 05/31/2011  . Nonischemic cardiomyopathy (HCC)  04/11/2010  . Diabetes mellitus type I (HCC) 04/11/2010    Past Surgical History:  Procedure Laterality Date  . CARDIAC CATHETERIZATION    . TUBAL LIGATION       OB History    Gravida  7   Para  5   Term  5   Preterm      AB  2   Living  5     SAB  2   TAB      Ectopic      Multiple      Live Births               Home Medications    Prior to Admission medications   Medication Sig Start Date End Date Taking? Authorizing Provider  acetaminophen (TYLENOL) 500 MG tablet Take 1,500 mg by mouth daily as needed for mild pain or headache.    [provider]  azithromycin (ZITHROMAX) 250 MG tablet Take 1 tablet (250 mg total) by mouth daily. Take first 2 tablets together, then 1 every day until finished. 02/15/16   Law, Waylan Boga, PA-C  benzonatate (TESSALON) 100 MG capsule Take 1 capsule (100 mg total) by mouth every 8 (eight) hours. 02/15/16   Law, Waylan Boga, PA-C  cephALEXin (KEFLEX) 500 MG capsule Take 1 capsule (500 mg total) by mouth 4 (four) times daily. Patient not taking: Reported on 02/15/2016 08/17/15   Bethann Berkshire, MD  insulin aspart protamine- aspart (NOVOLOG MIX 70/30) (70-30) 100 UNIT/ML injection Inject 0.35 mLs (35 Units total) into the skin 2 (two) times daily with a meal.  Patient not taking: Reported on 02/15/2016 01/18/15   Philip Aspen, Limmie Steph, MD  insulin NPH-regular Human (NOVOLIN 70/30) (70-30) 100 UNIT/ML injection Inject 35 Units into the skin 2 (two) times daily.    [provider]  lisinopril-hydrochlorothiazide (ZESTORETIC) 20-12.5 MG tablet Take 1 tablet by mouth daily. 02/15/16   Law, Waylan Boga, PA-C  metFORMIN (GLUCOPHAGE) 1000 MG tablet Take 1 tablet (1,000 mg total) by mouth 2 (two) times daily. Patient not taking: Reported on 08/17/2015 01/08/15   Zadie Rhine, MD  Multiple Vitamin (MULTIVITAMIN WITH MINERALS) TABS tablet Take 1 tablet by mouth daily.    [provider]  potassium chloride SA  (K-DUR,KLOR-CON) 20 MEQ tablet Take 2 tablets (40 mEq total) by mouth every 4 (four) hours. Patient not taking: Reported on 08/17/2015 01/18/15   Philip Aspen, Limmie Analeigh, MD  predniSONE (DELTASONE) 20 MG tablet Take 3 tablets (60 mg total) by mouth daily. 02/15/16   Law, Waylan Boga, PA-C  sulfamethoxazole-trimethoprim (BACTRIM DS,SEPTRA DS) 800-160 MG tablet Take 1 tablet by mouth 2 (two) times daily. Patient not taking: Reported on 08/17/2015 01/18/15   Philip Aspen, Limmie Ronnisha, MD  traMADol (ULTRAM) 50 MG tablet Take 1 tablet (50 mg total) by mouth every 6 (six) hours as needed. Patient not taking: Reported on 08/17/2015 07/06/14   Rolan Bucco, MD  triamcinolone cream (KENALOG) 0.1 % Apply 1 application topically 2 (two) times daily. Patient not taking: Reported on 01/13/2015 12/20/14   Ivery Quale, PA-C    Family History Family History  Problem Relation Age of Onset  . Stroke Mother   . Lung cancer Father     Social History Social History   Tobacco Use  . Smoking status: Current Every Day Smoker    Packs/day: 0.50    Types: Cigarettes  . Smokeless tobacco: Never Used  Substance Use Topics  . Alcohol use: No  . Drug use: No     Allergies   Banana and Latex   Review of Systems Review of Systems  Constitutional: Positive for fatigue and malaise/fatigue. Negative for chills, diaphoresis and fever.  HENT: Negative for congestion.   Eyes: Negative for visual disturbance.  Respiratory: Positive for cough, chest tightness, shortness of breath and wheezing. Negative for sputum production and stridor.   Cardiovascular: Positive for chest pain. Negative for palpitations, syncope and near-syncope.  Gastrointestinal: Negative for abdominal pain, constipation, diarrhea, nausea and vomiting.  Genitourinary: Negative for flank pain and frequency.  Musculoskeletal: Positive for back pain. Negative for neck pain and neck stiffness.  Skin: Negative for rash and wound.    Neurological: Negative for dizziness, light-headedness, numbness and headaches.  Psychiatric/Behavioral: Negative for agitation.  All other systems reviewed and are negative.    Physical Exam Updated Vital Signs BP (!) 177/116   Pulse (!) 102   Temp 97.7 F (36.5 C) (Oral)   Resp 11   LMP 08/02/2010   SpO2 96%   Physical Exam  Constitutional: She is oriented to person, place, and time. She appears well-developed and well-nourished. No distress.  HENT:  Head: Normocephalic and atraumatic.  Nose: Nose normal.  Mouth/Throat: Oropharynx is clear and moist. No oropharyngeal exudate.  Eyes: Pupils are equal, round, and reactive to light. Conjunctivae and EOM are normal.  Neck: Normal range of motion.  Cardiovascular: Normal rate and intact distal pulses.  No murmur heard. Pulmonary/Chest: Effort normal. No respiratory distress. She has wheezes. She has rales. She exhibits no tenderness.  Abdominal: Soft. She exhibits no distension.  Musculoskeletal: She exhibits edema (mild bilateral). She exhibits no tenderness.  Neurological: She is alert and oriented to person, place, and time. No sensory deficit. She exhibits normal muscle tone.  Skin: Capillary refill takes less than 2 seconds. No rash noted. She is not diaphoretic. No erythema.  Nursing note and vitals reviewed.    ED Treatments / Results  Labs (all labs ordered are listed, but only abnormal results are displayed) Labs Reviewed  BASIC METABOLIC PANEL - Abnormal; Notable for the following components:      Result Value   Glucose, Bld 276 (*)    Calcium 8.8 (*)    All other components within normal limits  CBC - Abnormal; Notable for the following components:   WBC 11.3 (*)    RDW 15.9 (*)    All other components within normal limits  D-DIMER, QUANTITATIVE (NOT AT [facility]) - Abnormal; Notable for the following components:   D-Dimer, Quant 0.93 (*)    All other components within normal limits  BRAIN NATRIURETIC PEPTIDE -  Abnormal; Notable for the following components:   B Natriuretic Peptide 271.6 (*)    All other components within normal limits  CBG MONITORING, ED - Abnormal; Notable for the following components:   Glucose-Capillary 189 (*)    All other components within normal limits  HEMOGLOBIN A1C  HIV ANTIBODY (ROUTINE TESTING)  BASIC METABOLIC PANEL  LIPID PANEL  I-STAT TROPONIN, ED  I-STAT TROPONIN, ED    EKG EKG Interpretation  Date/Time:  Tuesday May 27 2017 08:41:06 EDT Ventricular Rate:  111 PR Interval:  144 QRS Duration: 80 QT Interval:  368 QTC Calculation: 500 R Axis:   26 Text Interpretation:  Sinus tachycardia Right atrial enlargement Nonspecific T wave abnormality Abnormal ECG When compared to prior, no significant changes seen.  No STEMI Confirmed by Theda Belfast ([id number]) on 05/27/2017 12:30:21 PM   Radiology Dg Chest 2 View  Result Date: 05/27/2017 CLINICAL DATA:  Two weeks of shortness of breath. Onset of mid chest pain last night radiating to the back with pleuritic component. History of COPD, cardiomyopathy, diabetes, current smoker. EXAM: CHEST - 2 VIEW COMPARISON:  PA and lateral chest x-ray of February 15, 2016 FINDINGS: The lungs are well-expanded. The interstitial markings are increased. The cardiac silhouette is enlarged. The pulmonary vascularity is engorged. There is no pleural effusion. The bony thorax exhibits no acute abnormality. IMPRESSION: CHF superimposed upon COPD.  No alveolar pneumonia. Electronically Signed   By: David  [place] M.D.   On: 05/27/2017 09:27   Ct Angio Chest Pe W And/or Wo Contrast  Result Date: 05/27/2017 CLINICAL DATA:  Centralized chest pain.  Shortness of breath. EXAM: CT ANGIOGRAPHY CHEST WITH CONTRAST TECHNIQUE: Multidetector CT imaging of the chest was performed using the standard protocol during bolus administration of intravenous contrast. Multiplanar CT image reconstructions and MIPs were obtained to evaluate the vascular anatomy.  CONTRAST:  ISOVUE-370 IOPAMIDOL (ISOVUE-370) INJECTION 76% COMPARISON:  Chest CT Jun 18, 2010.  Chest x-ray May 27, 2017. FINDINGS: Cardiovascular: Cardiomegaly. Coronary artery calcifications. The thoracic aorta is nonaneurysmal with no dissection. No significant atherosclerotic change. The central pulmonary arteries are normal in caliber. No pulmonary emboli. Mediastinum/Nodes: The thyroid and esophagus are normal. No adenopathy. A few shotty nodes in the retrocrural space on axial image 66 of in stable since 2012, of no acute significance. No effusions. No chest wall abnormalities identified. Lungs/Pleura: The central airways are normal. No pneumothorax. Linear opacity in the lateral right lung such as on  series 7, image 47 and coronal image 64 is likely atelectasis given its appearance. Bibasilar atelectasis identified. Mild edema. No infiltrate to suggest pneumonia. There is a nodule in the left base on series 7, image 51 measuring 6 mm today, unchanged since 2012, requiring no follow-up. A nodule in the right base on series 7, image 39 is stable as well. No new nodules or masses. Upper Abdomen: No acute abnormality. Musculoskeletal: No chest wall abnormality. No acute or significant osseous findings. Review of the MIP images confirms the above findings. IMPRESSION: 1. Cardiomegaly, scattered atelectasis, and mild edema. No convincing evidence of pneumonia. 2. No pulmonary emboli identified. 3. No other acute abnormalities. Electronically Signed   By: Gerome Sam III M.D   On: 05/27/2017 16:48    Procedures Procedures (including critical care time)  Medications Ordered in ED Medications  aspirin chewable tablet 81 mg (has no administration in time range)  bismuth subsalicylate (PEPTO BISMOL) 262 MG/15ML suspension 30 mL (has no administration in time range)  diphenhydrAMINE (BENADRYL) tablet 25 mg (has no administration in time range)  insulin aspart protamine- aspart (NOVOLOG MIX 70/30)  injection 35 Units (has no administration in time range)  multivitamin with minerals tablet 1 tablet (has no administration in time range)  nicotine (NICODERM CQ - dosed in mg/24 hours) patch 21 mg (has no administration in time range)  methylPREDNISolone sodium succinate (SOLU-MEDROL) 40 mg/mL injection 40 mg (has no administration in time range)    Followed by  predniSONE (DELTASONE) tablet 40 mg (has no administration in time range)  albuterol (PROVENTIL) (2.5 MG/3ML) 0.083% nebulizer solution 2.5 mg (has no administration in time range)  enoxaparin (LOVENOX) injection 40 mg (has no administration in time range)  sodium chloride flush (NS) 0.9 % injection 3 mL (has no administration in time range)  acetaminophen (TYLENOL) tablet 650 mg (has no administration in time range)    Or  acetaminophen (TYLENOL) suppository 650 mg (has no administration in time range)  ondansetron (ZOFRAN) tablet 4 mg (has no administration in time range)    Or  ondansetron (ZOFRAN) injection 4 mg (has no administration in time range)  hydrALAZINE (APRESOLINE) injection 10 mg (10 mg Intravenous Given 05/27/17 1941)  nystatin (MYCOSTATIN/NYSTOP) topical powder (has no administration in time range)  albuterol (PROVENTIL) (2.5 MG/3ML) 0.083% nebulizer solution 5 mg (5 mg Nebulization Given 05/27/17 1140)  ipratropium-albuterol (DUONEB) 0.5-2.5 (3) MG/3ML nebulizer solution 3 mL (3 mLs Nebulization Given 05/27/17 1309)  methylPREDNISolone sodium succinate (SOLU-MEDROL) 125 mg/2 mL injection 125 mg (125 mg Intravenous Given 05/27/17 1309)  iopamidol (ISOVUE-370) 76 % injection 100 mL (100 mLs Intravenous Contrast Given 05/27/17 1623)  furosemide (LASIX) injection 40 mg (40 mg Intravenous Given 05/27/17 1841)     Initial Impression / Assessment and Plan / ED Course  I have reviewed the triage vital signs and the nursing notes.  Pertinent labs & imaging results that were available during my care of the patient were reviewed by me  and considered in my medical decision making (see chart for details).     DONYAE KOHN is a 57 y.o. female with a past medical history significant for COPD, hypertension, CHF, diabetes, and degenerative disc disease who presents with chest pain shortness of breath.  Patient reports that for the last 2 weeks she has had gradually worsening shortness of breath.  She says that she began having chest pain yesterday.  She describes her symptoms as a pressure and tightness of her chest.  It goes  across her chest and into her back.  She reports that she has had some wheezing.  She reports the pressure pain is up to an 8 out of 10 in severity but is currently a 6 out of 10.  She reports that it is extremely exertional and pleuritic.  She reports he had a dry cough but has not had any hemoptysis.  She reports she has had some mild leg edema bilaterally for the last few days.  She denies any fevers, chills, or congestion.  She denies any urinary symptoms or GI symptoms.  She denies any trauma.  She is feeling very fatigued.  On arrival, patient was found to be hypoxic with oxygen saturations in the 80s on room air.  Patient was placed on 2 L nasal cannula.  Patient had to be escalated to 3 L nasal cannula for dropping back into the 80s.  She does not take oxygen at home.    On exam, lungs had wheezing and crackles in the bases.  Patient's chest was nontender.  Abdomen was nontender.  Legs had very minimal edema seen.  No focal neurologic deficits.  Patient had normal pulses in upper and lower extremities.  Based on patient's symptoms I am concerned about COPD exacerbation, CHF exacerbation, cardiac etiology of symptoms or PE with her hypoxia chest pain and pleuritic symptoms.  Patient had initially negative troponin.  Mild leukocytosis.  BMP reassuring.  BNP ordered and is still in process.  D-dimer was elevated.  Chest x-ray shows no evidence of pneumonia but does show CHF and COPD.  CT PE study will be  ordered given the elevated d-dimer.  Once imaging is completed, anticipate admission for the new oxygen requirement and concerning symptoms.  CT scan showed no evidence of pulmonary embolism.  Fluid seen on CT.  Patient will be admitted for new hypoxia in the setting of likely CHF exacerbation versus COPD exacerbation.  Hospitalist will admit the patient.  Patient admitted in stable condition.   Final Clinical Impressions(s) / ED Diagnoses   Final diagnoses:  Hypoxia  Congestive heart failure, unspecified HF chronicity, unspecified heart failure type (HCC)  COPD exacerbation [facility])    ED Discharge Orders    None      Clinical Impression: 1. Hypoxia   2. Congestive heart failure, unspecified HF chronicity, unspecified heart failure type (HCC)   3. COPD exacerbation (HCC)     Disposition: Admit  This note was prepared with assistance of Dragon voice recognition software. Occasional wrong-word or sound-a-like substitutions may have occurred due to the inherent limitations of voice recognition software.     Tegeler, Canary Brim, MD 05/27/17 [phone]

## 2017-05-27 NOTE — ED Notes (Signed)
RN notified about BP

## 2017-05-28 ENCOUNTER — Inpatient Hospital Stay (HOSPITAL_COMMUNITY): Payer: Self-pay

## 2017-05-28 ENCOUNTER — Other Ambulatory Visit: Payer: Self-pay

## 2017-05-28 DIAGNOSIS — I10 Essential (primary) hypertension: Secondary | ICD-10-CM

## 2017-05-28 DIAGNOSIS — I361 Nonrheumatic tricuspid (valve) insufficiency: Secondary | ICD-10-CM

## 2017-05-28 DIAGNOSIS — I5021 Acute systolic (congestive) heart failure: Secondary | ICD-10-CM

## 2017-05-28 DIAGNOSIS — J9601 Acute respiratory failure with hypoxia: Secondary | ICD-10-CM

## 2017-05-28 LAB — GLUCOSE, CAPILLARY
GLUCOSE-CAPILLARY: 179 mg/dL — AB (ref 65–99)
GLUCOSE-CAPILLARY: 353 mg/dL — AB (ref 65–99)
GLUCOSE-CAPILLARY: 414 mg/dL — AB (ref 65–99)
GLUCOSE-CAPILLARY: 470 mg/dL — AB (ref 65–99)
Glucose-Capillary: 374 mg/dL — ABNORMAL HIGH (ref 65–99)
Glucose-Capillary: 347 mg/dL — ABNORMAL HIGH (ref 65–99)

## 2017-05-28 LAB — BASIC METABOLIC PANEL
Anion gap: 11 (ref 5–15)
BUN: 16 mg/dL (ref 6–20)
CHLORIDE: 96 mmol/L — AB (ref 101–111)
CO2: 29 mmol/L (ref 22–32)
Calcium: 9.2 mg/dL (ref 8.9–10.3)
Creatinine, Ser: 0.96 mg/dL (ref 0.44–1.00)
GFR calc Af Amer: >60 mL/min (ref >60)
GFR calc non Af Amer: >60 mL/min (ref >60)
GLUCOSE: 438 mg/dL — AB (ref 65–99)
POTASSIUM: 3.7 mmol/L (ref 3.5–5.1)
Sodium: 136 mmol/L (ref 135–145)

## 2017-05-28 LAB — LIPID PANEL
CHOL/HDL RATIO: 3.2 ratio
Cholesterol: 169 mg/dL (ref 0–200)
HDL: 53 mg/dL (ref >40)
LDL Cholesterol: 103 mg/dL — ABNORMAL HIGH (ref 0–99)
Triglycerides: 64 mg/dL (ref <150)
VLDL: 13 mg/dL (ref 0–40)

## 2017-05-28 LAB — HEMOGLOBIN A1C
Hgb A1c MFr Bld: 8.6 % — ABNORMAL HIGH (ref 4.8–5.6)
Mean Plasma Glucose: 200.12 mg/dL

## 2017-05-28 LAB — PROTIME-INR
INR: 1.05
Prothrombin Time: 13.6 seconds (ref 11.4–15.2)

## 2017-05-28 LAB — ECHOCARDIOGRAM COMPLETE: Weight: 3185.21 oz

## 2017-05-28 LAB — HIV ANTIBODY (ROUTINE TESTING W REFLEX): HIV Screen 4th Generation wRfx: NONREACTIVE

## 2017-05-28 LAB — MRSA PCR SCREENING: MRSA by PCR: NEGATIVE

## 2017-05-28 MED ORDER — DIGOXIN 125 MCG PO TABS
0.1250 mg | ORAL_TABLET | Freq: Every day | ORAL | Status: DC
  Administered 2017-05-28 – 2017-05-30 (×3): 0.125 mg via ORAL
  Filled 2017-05-28 (×3): qty 1

## 2017-05-28 MED ORDER — INSULIN ASPART PROT & ASPART (70-30 MIX) 100 UNIT/ML ~~LOC~~ SUSP
45.0000 [IU] | Freq: Two times a day (BID) | SUBCUTANEOUS | Status: DC
  Administered 2017-05-28 – 2017-05-30 (×2): 45 [IU] via SUBCUTANEOUS
  Filled 2017-05-28: qty 10

## 2017-05-28 MED ORDER — HYDRALAZINE HCL 20 MG/ML IJ SOLN: 10.0000 mg | INTRAMUSCULAR | Status: DC | PRN

## 2017-05-28 MED ORDER — SPIRONOLACTONE 12.5 MG HALF TABLET
12.5000 mg | ORAL_TABLET | Freq: Every day | ORAL | Status: DC
  Administered 2017-05-28 – 2017-05-30 (×3): 12.5 mg via ORAL
  Filled 2017-05-28 (×4): qty 1

## 2017-05-28 MED ORDER — INSULIN ASPART 100 UNIT/ML ~~LOC~~ SOLN
8.0000 [IU] | Freq: Once | SUBCUTANEOUS | Status: AC
  Administered 2017-05-28: 8 [IU] via SUBCUTANEOUS

## 2017-05-28 MED ORDER — LOSARTAN POTASSIUM 50 MG PO TABS
50.0000 mg | ORAL_TABLET | Freq: Two times a day (BID) | ORAL | Status: DC
  Administered 2017-05-28 – 2017-05-29 (×2): 50 mg via ORAL
  Filled 2017-05-28 (×2): qty 1

## 2017-05-28 MED ORDER — LOSARTAN POTASSIUM 25 MG PO TABS: 25.0000 mg | ORAL_TABLET | Freq: Two times a day (BID) | ORAL | Status: DC

## 2017-05-28 MED ORDER — LISINOPRIL 5 MG PO TABS
5.0000 mg | ORAL_TABLET | Freq: Every day | ORAL | Status: DC
  Administered 2017-05-28: 5 mg via ORAL
  Filled 2017-05-28: qty 1

## 2017-05-28 MED ORDER — INSULIN ASPART 100 UNIT/ML ~~LOC~~ SOLN: 0.0000 [IU] | Freq: Every day | SUBCUTANEOUS | Status: DC

## 2017-05-28 MED ORDER — PREDNISONE 20 MG PO TABS
40.0000 mg | ORAL_TABLET | Freq: Every day | ORAL | Status: DC
  Administered 2017-05-30: 40 mg via ORAL
  Filled 2017-05-28: qty 2

## 2017-05-28 MED ORDER — INSULIN ASPART 100 UNIT/ML ~~LOC~~ SOLN: 0.0000 [IU] | Freq: Three times a day (TID) | SUBCUTANEOUS | Status: DC

## 2017-05-28 MED ORDER — INSULIN ASPART 100 UNIT/ML ~~LOC~~ SOLN
10.0000 [IU] | Freq: Once | SUBCUTANEOUS | Status: AC
  Administered 2017-05-28: 10 [IU] via INTRAVENOUS

## 2017-05-28 MED ORDER — FUROSEMIDE 10 MG/ML IJ SOLN
40.0000 mg | Freq: Two times a day (BID) | INTRAMUSCULAR | Status: AC
  Administered 2017-05-28 – 2017-05-29 (×2): 40 mg via INTRAVENOUS
  Filled 2017-05-28 (×2): qty 4

## 2017-05-28 NOTE — Progress Notes (Signed)
  Echocardiogram 2D Echocardiogram has been performed.  Leta Jungling M 05/28/2017, 9:18 AM

## 2017-05-28 NOTE — Progress Notes (Signed)
PROGRESS NOTE    Felicia [name]  ZOX:[id number] DOB: 05-24-1960 DOA: 05/27/2017 PCP: Patient, No Pcp Per   Brief Narrative: Patient is a 57 year old female with past medical history of obesity, tobacco abuse, COPD, insulin-dependent diabetes meds, hypertension, nonischemic cardiomyopathy presents to the emergency department with 2-week history of shortness of breath and wheezing.  Patient found to have wheezes and crackles on examination.  Patient admitted for further management of COPD and CHF exacerbation.  Assessment & Plan:   Principal Problem:   Acute respiratory failure with hypoxia (HCC) Active Problems:   Nonischemic cardiomyopathy (HCC)   Hypertension   Uncontrolled diabetes mellitus (HCC)   COPD with acute exacerbation (HCC)   Obesity   Tobacco use  Acute respiratory failure with hypoxia: Most likely secondary to COPD/CHF exacerbation, possible pulmonary edema.  Currently her respiratory status stable.  She is off oxygen.  Patient does not use oxygen at home.  COPD exacerbation: Much improved started on IV steroids.  Patient does not have any significant wheezes today on examination.  We will change the steroids to oral. She did not have a PCP currently.  She needs to follow-up as an outpatient.  We will try to arrange DME nebulizer machine on discharge.  She needs to follow-up with pulmonology outpatient for possible pulmonary function test.  Smoker: Continue nicotine patch.  Encouraged tobacco cessation.  History of nonischemic cardiomyopathy; Currently euvolemic.  Does not have any peripheral edema.  Lungs are clear.  She was given a dose of Lasix yesterday. Echo showed EF of 25-30%.  Mild left ventricular hypertrophy.  Severely reduced systolic function.  Diffuse hypokinesis. Her echocardiogram on 06/04/2011 showed ejection fraction of 50 to 55% We will request for cardiology evaluation.  Insulin-dependent diabetes mellitus: Hemoglobin A1c of 8.6.  Patient is on  insulin 70/30 at home and takes 35 units twice a day.  She says she has been compliant with her insulin administration.  Might need to increase the dose of insulin on discharge.  LDL of 103. We will also consult diabetic coordinator.  Hypertension: Blood pressure noted to be in the higher side this morning started on lisinopril 5 mg daily.  She might be a candidate for [employer]. She is also not on beta blockers at home. Will wait for cardiology recommendation.   DVT prophylaxis: Lovenox Code Status: Full Family Communication: None on the bedside Disposition Plan: Likely home after cardiology evaluation   Consultants: Cardiology  Procedures: Echocardiogram   Antimicrobials: None  Subjective:  Patient seen and examined at bedside this morning.  Remains comfortable.  Not in respiratory distress.   Objective: Vitals:   05/27/17 2129 05/28/17 0028 05/28/17 0300 05/28/17 0758  BP:  (!) 167/98 (!) 160/90 (!) 173/98  Pulse:  (!) 110 (!) 110 (!) 101  Resp:  18 18 16   Temp:  97.8 F (36.6 C) 98 F (36.7 C) 98 F (36.7 C)  TempSrc:  Oral Oral Oral  SpO2: 97% 97% 97% 90%  Weight:   90.3 kg (199 lb 1.2 oz)     Intake/Output Summary (Last 24 hours) at 05/28/2017 1123 Last data filed at 05/28/2017 0000 Gross per 24 hour  Intake -  Output 850 ml  Net -850 ml   Filed Weights   05/28/17 0300  Weight: 90.3 kg (199 lb 1.2 oz)    Examination:  General exam: Appears calm and comfortable ,Not in distress,obese HEENT:PERRL,Oral mucosa moist, Ear/Nose normal on gross exam Respiratory system: Bilateral equal air entry, normal vesicular breath sounds,  no wheezes or crackles  Cardiovascular system: S1 & S2 heard, RRR. No JVD, murmurs, rubs, gallops or clicks. No pedal edema. Gastrointestinal system: Abdomen is nondistended, soft and nontender. No organomegaly or masses felt. Normal bowel sounds heard. Central nervous system: Alert and oriented. No focal neurological deficits. Extremities: No  edema, no clubbing ,no cyanosis, distal peripheral pulses palpable. Skin: No rashes, lesions or ulcers,no icterus ,no pallor MSK: Normal muscle bulk,tone ,power Psychiatry: Judgement and insight appear normal. Mood & affect appropriate.     Data Reviewed: I have personally reviewed following labs and imaging studies  CBC: Recent Labs  Lab 05/27/17 0918  WBC 11.3*  HGB 13.5  HCT 42.2  MCV 91.9  PLT 253   Basic Metabolic Panel: Recent Labs  Lab 05/27/17 0918 05/28/17 0230  NA 139 136  K 3.6 3.7  CL 102 96*  CO2 27 29  GLUCOSE 276* 438*  BUN 11 16  CREATININE 0.73 0.96  CALCIUM 8.8* 9.2   GFR: CrCl cannot be calculated (Unknown ideal weight.). Liver Function Tests: No results for input(s): AST, ALT, ALKPHOS, BILITOT, PROT, ALBUMIN in the last 168 hours. No results for input(s): LIPASE, AMYLASE in the last 168 hours. No results for input(s): AMMONIA in the last 168 hours. Coagulation Profile: No results for input(s): INR, PROTIME in the last 168 hours. Cardiac Enzymes: No results for input(s): CKTOTAL, CKMB, CKMBINDEX, TROPONINI in the last 168 hours. BNP (last 3 results) No results for input(s): PROBNP in the last 8760 hours. HbA1C: Recent Labs    05/28/17 0230  HGBA1C 8.6*   CBG: Recent Labs  Lab 05/27/17 1211 05/28/17 0018 05/28/17 0422 05/28/17 0932 05/28/17 1102  GLUCAP 189* 470* 353* 414* 374*   Lipid Profile: Recent Labs    05/28/17 0230  CHOL 169  HDL 53  LDLCALC 103*  TRIG 64  CHOLHDL 3.2   Thyroid Function Tests: No results for input(s): TSH, T4TOTAL, FREET4, T3FREE, THYROIDAB in the last 72 hours. Anemia Panel: No results for input(s): VITAMINB12, FOLATE, FERRITIN, TIBC, IRON, RETICCTPCT in the last 72 hours. Sepsis Labs: No results for input(s): PROCALCITON, LATICACIDVEN in the last 168 hours.  No results found for this or any previous visit (from the past 240 hour(s)).       Radiology Studies: Dg Chest 2 View  Result  Date: 05/27/2017 CLINICAL DATA:  Two weeks of shortness of breath. Onset of mid chest pain last night radiating to the back with pleuritic component. History of COPD, cardiomyopathy, diabetes, current smoker. EXAM: CHEST - 2 VIEW COMPARISON:  PA and lateral chest x-ray of February 15, 2016 FINDINGS: The lungs are well-expanded. The interstitial markings are increased. The cardiac silhouette is enlarged. The pulmonary vascularity is engorged. There is no pleural effusion. The bony thorax exhibits no acute abnormality. IMPRESSION: CHF superimposed upon COPD.  No alveolar pneumonia. Electronically Signed   By: David  [place] M.D.   On: 05/27/2017 09:27   Ct Angio Chest Pe W And/or Wo Contrast  Result Date: 05/27/2017 CLINICAL DATA:  Centralized chest pain.  Shortness of breath. EXAM: CT ANGIOGRAPHY CHEST WITH CONTRAST TECHNIQUE: Multidetector CT imaging of the chest was performed using the standard protocol during bolus administration of intravenous contrast. Multiplanar CT image reconstructions and MIPs were obtained to evaluate the vascular anatomy. CONTRAST:  ISOVUE-370 IOPAMIDOL (ISOVUE-370) INJECTION 76% COMPARISON:  Chest CT Jun 18, 2010.  Chest x-ray May 27, 2017. FINDINGS: Cardiovascular: Cardiomegaly. Coronary artery calcifications. The thoracic aorta is nonaneurysmal with no dissection. No  significant atherosclerotic change. The central pulmonary arteries are normal in caliber. No pulmonary emboli. Mediastinum/Nodes: The thyroid and esophagus are normal. No adenopathy. A few shotty nodes in the retrocrural space on axial image 66 of in stable since 2012, of no acute significance. No effusions. No chest wall abnormalities identified. Lungs/Pleura: The central airways are normal. No pneumothorax. Linear opacity in the lateral right lung such as on series 7, image 47 and coronal image 64 is likely atelectasis given its appearance. Bibasilar atelectasis identified. Mild edema. No infiltrate to suggest  pneumonia. There is a nodule in the left base on series 7, image 51 measuring 6 mm today, unchanged since 2012, requiring no follow-up. A nodule in the right base on series 7, image 39 is stable as well. No new nodules or masses. Upper Abdomen: No acute abnormality. Musculoskeletal: No chest wall abnormality. No acute or significant osseous findings. Review of the MIP images confirms the above findings. IMPRESSION: 1. Cardiomegaly, scattered atelectasis, and mild edema. No convincing evidence of pneumonia. 2. No pulmonary emboli identified. 3. No other acute abnormalities. Electronically Signed   By: Gerome Sam III M.D   On: 05/27/2017 16:48        Scheduled Meds: . aspirin  81 mg Oral Daily  . enoxaparin (LOVENOX) injection  40 mg Subcutaneous Q24H  . insulin aspart  10 Units Intravenous Once  . insulin aspart protamine- aspart  45 Units Subcutaneous BID WC  . lisinopril  5 mg Oral Daily  . methylPREDNISolone (SOLU-MEDROL) injection  40 mg Intravenous Q6H   Followed by  . [START ON 05/29/2017] predniSONE  40 mg Oral Q breakfast  . multivitamin with minerals  1 tablet Oral Daily  . nicotine  21 mg Transdermal QHS  . nystatin   Topical TID  . sodium chloride flush  3 mL Intravenous Q12H   Continuous Infusions:   LOS: 1 day    Time spent: More than 50% of that time was spent in counseling and/or coordination of care.      Burnadette Pop, MD [facility] Pager [phone]  If 7PM-7AM, please contact night-coverage [email or web address] Password [facility] 05/28/2017, 11:23 AM

## 2017-05-28 NOTE — Care Management Note (Addendum)
Case Management Note  Patient Details  Name: Felicia [name] MRN: [id number] Date of Birth: April 25, 1960  Subjective/Objective:  From home, NCM received consult for neb machine and PCP, patient to call [facility] on Monday 5/13 to schedule a follow up apt, they have no appts available today.  NCM spoke with Lupita Leash with [facility] for charity for the neb machine, patient will have to get meds for neb machine from pharmacy.     5/9 1416 Letha Cape RN, BSN - Lupita Leash with [facility] is checking into charity for neb machine for patient.   5/10 Letha Cape RN, BSN - NCM will ast patient with medications with [employer], and she should have neb machine in her room.  Also gave patient the 30 day free savings coupon for entresto and patient assistance form for entresto, informed her to take patient assist form to follow up apt at [facility].                  Action/Plan: NCM will follow for dc needs.   Expected Discharge Date:                  Expected Discharge Plan:  Home/Self Care  In-House Referral:     Discharge planning Services  CM Consult, Indigent Health Clinic, Medication Assistance  Post Acute Care Choice:  Durable Medical Equipment Choice offered to:     DME Arranged:  Nebulizer machine DME Agency:  [facility]  HH Arranged:    [facility] Agency:     Status of Service:  In process, will continue to follow  If discussed at Long Length of Stay Meetings, dates discussed:    Additional Comments:  Leone Haven, RN 05/28/2017, 2:49 PM

## 2017-05-28 NOTE — Progress Notes (Signed)
Inpatient Diabetes Program Recommendations  AACE/ADA: New Consensus Statement on Inpatient Glycemic Control (2015)  Target Ranges:  Prepandial:   less than 140 mg/dL      Peak postprandial:   less than 180 mg/dL (1-2 hours)      Critically ill patients:  140 - 180 mg/dL   Lab Results  Component Value Date   GLUCAP 374 (H) 05/28/2017   HGBA1C 8.6 (H) 05/28/2017    Review of Glycemic Control Results for Felicia [name], Felicia [name] (MRN [id number]) as of 05/28/2017 11:26  Ref. Range 05/27/2017 12:11 05/28/2017 00:18 05/28/2017 04:22 05/28/2017 09:32 05/28/2017 11:02  Glucose-Capillary Latest Ref Range: 65 - 99 mg/dL 037 (H) 048 (H) 889 (H) 414 (H) 374 (H)   Diabetes history: DM2 Outpatient Diabetes medications: Novolin 70/30 insulin 35 units bid Current orders for Inpatient glycemic control: Novolog 70/30 45 units bid + 1 time dose of Novolog 10 units  Inpatient Diabetes Program Recommendations:   Noted increase in 70/30 insulin to 45 units bid. May need adjustment in insulin as steroids are adjusted.  Thank you, Billy Fischer. Laxmi Choung, RN, MSN, CDE  Diabetes Coordinator Inpatient Glycemic Control Team Team Pager [phone] (8am-5pm) 05/28/2017 11:28 AM

## 2017-05-28 NOTE — Consult Note (Addendum)
Advanced Heart Failure Team Consult Note   Primary Physician: Patient, No Pcp Per PCP-Cardiologist:  No primary care provider on file.  Reason for Consultation: Acute systolic CHF  HPI:    Felicia [name] is seen today for evaluation of Acute systolic CHF at the request of Dr. Renford Dills.   Felicia [name] is a 57 y.o. female  with h/o of morbid obesity, tobacco abuse, COPD, IDDM, HTN, and NICM.  Diagnosed with NICM in 2012. EF 20-25%. Cath at that time with no significant CAD. Treated medically and EF improved to 55% but was lost to follow-up.   Despite her weight has been fairly functional. However, over the past 2 weeks pt has had worsening DOE, to the point of having symptoms at rest. She has had worsening orthopnea and peripheral edema as well. Says chest feels tight when she walks.   She felt worse on 05/27/17, and had very little benefit from her inhaler, prompting ED visit.   On arrival found to be hypoxic requiring O2 at 3L. Pertinent labs on admission include BNP 271.6, Cr 0.73, K 3.6, Glucose 276, D-dimer positive, HgB A1c 8.6. CTA chest with cardiomegaly, scattered atelectasis, and mild edema. No PE identified. CXR showed components of CHF and COPD. Repeat echo as below with newly reduced EF 20-25% (Previously 50-55% in 2013)  As above, has felt worse over the past 2 weeks. Usually can do her ADLs and go to the store without SOB. PTA, was SOB walking from chair to bathroom.  She states she has gained about 40-50 lbs in the past 1.5 years. She is not very active. Lives at home with her daughter. Snores heavily with periods of apnea. Has to sleep propped up on several pillows.   Echo 05/28/17 LVEF 25-30%, Trivial MR, Mild TR, PA peak pressure 40 mm Hg, elevated CVP.  Review of Systems: [y] = yes, [ ]  = no   General: Weight gain [y]; Weight loss [ ] ; Anorexia [ ] ; Fatigue [y]; Fever [ ] ; Chills [ ] ; Weakness [ ]   Cardiac: Chest pain/pressure [ ] ; Resting SOB [y]; Exertional  SOB [y]; Orthopnea [y]; Pedal Edema [y]; Palpitations [ ] ; Syncope [ ] ; Presyncope [ ] ; Paroxysmal nocturnal dyspnea[y]  Pulmonary: Cough [y]; Wheezing[ ] ; Hemoptysis[ ] ; Sputum [ ] ; Snoring [ ]   GI: Vomiting[ ] ; Dysphagia[ ] ; Melena[ ] ; Hematochezia [ ] ; Heartburn[ ] ; Abdominal pain [ ] ; Constipation [ ] ; Diarrhea [ ] ; BRBPR [ ]   GU: Hematuria[ ] ; Dysuria [ ] ; Nocturia[ ]   Vascular: Pain in legs with walking [ ] ; Pain in feet with lying flat [ ] ; Non-healing sores [ ] ; Stroke [ ] ; TIA [ ] ; Slurred speech [ ] ;  Neuro: Headaches[ ] ; Vertigo[ ] ; Seizures[ ] ; Paresthesias[ ] ;Blurred vision [ ] ; Diplopia [ ] ; Vision changes [ ]   Ortho/Skin: Arthritis [y]; Joint pain [y]; Muscle pain [ ] ; Joint swelling [ ] ; Back Pain [ ] ; Rash [ ]   Psych: Depression[ ] ; Anxiety[ ]   Heme: Bleeding problems [ ] ; Clotting disorders [ ] ; Anemia [ ]   Endocrine: Diabetes [ y]; Thyroid dysfunction[ ]   Home Medications Prior to Admission medications   Medication Sig Start Date End Date Taking? Authorizing Provider  acetaminophen (TYLENOL) 500 MG tablet Take 1,500 mg by mouth daily as needed for mild pain or headache.   Yes [provider]  aspirin 81 MG chewable tablet Chew 81 mg by mouth daily. 04/16/16  Yes [provider]  bismuth subsalicylate (PEPTO BISMOL) 262 MG/15ML  suspension Take 30 mLs by mouth as needed for indigestion.   Yes [provider]  diphenhydrAMINE (BENADRYL) 25 MG tablet Take 25 mg by mouth as needed for allergies.   Yes [provider]  diphenhydramine-acetaminophen (TYLENOL PM EXTRA STRENGTH) 25-500 MG TABS tablet Take 2 tablets by mouth at bedtime as needed.   Yes [provider]  ibuprofen (ADVIL,MOTRIN) 200 MG tablet Take 800 mg by mouth as needed for moderate pain.   Yes [provider]  insulin NPH-regular Human (NOVOLIN 70/30) (70-30) 100 UNIT/ML injection Inject 35 Units into the skin 2 (two) times daily.   Yes [provider]    Multiple Vitamin (MULTIVITAMIN WITH MINERALS) TABS tablet Take 1 tablet by mouth daily.   Yes [provider]  potassium chloride SA (K-DUR,KLOR-CON) 20 MEQ tablet Take 2 tablets (40 mEq total) by mouth every 4 (four) hours. Patient not taking: Reported on 08/17/2015 01/18/15   Philip Aspen, Limmie Khylee, MD    Past Medical History: Past Medical History:  Diagnosis Date  . Cardiomyopathy   . CHF (congestive heart failure) (HCC)    minamal  . COPD (chronic obstructive pulmonary disease) (HCC)   . DDD (degenerative disc disease), lumbar   . Diabetes mellitus   . Hypertension   . Non compliance w medication regimen   . Normal echocardiogram    LVEF 25-30% 03/27/2010    Past Surgical History: Past Surgical History:  Procedure Laterality Date  . CARDIAC CATHETERIZATION    . TUBAL LIGATION      Family History: Family History  Problem Relation Age of Onset  . Stroke Mother   . Lung cancer Father     Social History: Social History   Socioeconomic History  . Marital status: Single    Spouse name: Not on file  . Number of children: 2  . Years of education: Not on file  . Highest education level: Not on file  Occupational History  . Not on file  Social Needs  . Financial resource strain: Not on file  . Food insecurity:    Worry: Not on file    Inability: Not on file  . Transportation needs:    Medical: Not on file    Non-medical: Not on file  Tobacco Use  . Smoking status: Current Every Day Smoker    Packs/day: 0.50    Types: Cigarettes  . Smokeless tobacco: Never Used  Substance and Sexual Activity  . Alcohol use: No  . Drug use: No  . Sexual activity: Yes    Birth control/protection: Surgical, Post-menopausal  Lifestyle  . Physical activity:    Days per week: Not on file    Minutes per session: Not on file  . Stress: Not on file  Relationships  . Social connections:    Talks on phone: Not on file    Gets together: Not on file    Attends religious  service: Not on file    Active member of club or organization: Not on file    Attends meetings of clubs or organizations: Not on file    Relationship status: Not on file  Other Topics Concern  . Not on file  Social History Narrative  . Not on file    Allergies:  Allergies  Allergen Reactions  . Banana Itching  . Latex Itching    Objective:    Vital Signs:   Temp:  [97.8 F (36.6 C)-98 F (36.7 C)] 98 F (36.7 C) (05/08 0758) Pulse Rate:  [  96-115] 101 (05/08 1210) Resp:  [16-20] 16 (05/08 1210) BP: (145-182)/(58-118) 173/98 (05/08 0758) SpO2:  [89 %-97 %] 90 % (05/08 0758) Weight:  [199 lb 1.2 oz (90.3 kg)] 199 lb 1.2 oz (90.3 kg) (05/08 1210)    Weight change: Filed Weights   05/28/17 0300 05/28/17 1210  Weight: 199 lb 1.2 oz (90.3 kg) 199 lb 1.2 oz (90.3 kg)    Intake/Output:   Intake/Output Summary (Last 24 hours) at 05/28/2017 1217 Last data filed at 05/28/2017 0900 Gross per 24 hour  Intake 120 ml  Output 850 ml  Net -730 ml      Physical Exam    General:  Fatigued appearing. No resp difficulty. Short HEENT: normal aniceric Neck: supple. JVP difficult, but does not appear very elevated. Carotids 2+ bilat; no bruits. No lymphadenopathy or thyromegaly appreciated. Cor: PMI nondisplaced. Regular rate & rhythm. No rubs, gallops or murmurs. Lungs: Distant basilar sounds. No wheeze Abdomen: Markedly obese, nontender, nondistended. No hepatosplenomegaly. No bruits or masses. Good bowel sounds. Extremities: no cyanosis, clubbing, or rash. No edema  Neuro: alert & oriented x 3, cranial nerves grossly intact. moves all 4 extremities w/o difficulty. Affect pleasant  Telemetry   NSR/ ST 90-100s, personally reviewed.  EKG    Sinus Tach 111 bpm, Normal QRS, personally reviewed.  Labs   Basic Metabolic Panel: Recent Labs  Lab 05/27/17 0918 05/28/17 0230  NA 139 136  K 3.6 3.7  CL 102 96*  CO2 27 29  GLUCOSE 276* 438*  BUN 11 16  CREATININE 0.73 0.96    CALCIUM 8.8* 9.2   Liver Function Tests: No results for input(s): AST, ALT, ALKPHOS, BILITOT, PROT, ALBUMIN in the last 168 hours. No results for input(s): LIPASE, AMYLASE in the last 168 hours. No results for input(s): AMMONIA in the last 168 hours.  CBC: Recent Labs  Lab 05/27/17 0918  WBC 11.3*  HGB 13.5  HCT 42.2  MCV 91.9  PLT 253   Cardiac Enzymes: No results for input(s): CKTOTAL, CKMB, CKMBINDEX, TROPONINI in the last 168 hours.  BNP: BNP (last 3 results) Recent Labs    05/27/17 1333  BNP 271.6*   ProBNP (last 3 results) No results for input(s): PROBNP in the last 8760 hours.  CBG: Recent Labs  Lab 05/28/17 0018 05/28/17 0422 05/28/17 0932 05/28/17 1102 05/28/17 1209  GLUCAP 470* 353* 414* 374* 347*    Coagulation Studies: No results for input(s): LABPROT, INR in the last 72 hours.   Imaging   Ct Angio Chest Pe W And/or Wo Contrast  Result Date: 05/27/2017 CLINICAL DATA:  Centralized chest pain.  Shortness of breath. EXAM: CT ANGIOGRAPHY CHEST WITH CONTRAST TECHNIQUE: Multidetector CT imaging of the chest was performed using the standard protocol during bolus administration of intravenous contrast. Multiplanar CT image reconstructions and MIPs were obtained to evaluate the vascular anatomy. CONTRAST:  ISOVUE-370 IOPAMIDOL (ISOVUE-370) INJECTION 76% COMPARISON:  Chest CT Jun 18, 2010.  Chest x-ray May 27, 2017. FINDINGS: Cardiovascular: Cardiomegaly. Coronary artery calcifications. The thoracic aorta is nonaneurysmal with no dissection. No significant atherosclerotic change. The central pulmonary arteries are normal in caliber. No pulmonary emboli. Mediastinum/Nodes: The thyroid and esophagus are normal. No adenopathy. A few shotty nodes in the retrocrural space on axial image 66 of in stable since 2012, of no acute significance. No effusions. No chest wall abnormalities identified. Lungs/Pleura: The central airways are normal. No pneumothorax. Linear  opacity in the lateral right lung such as on series 7, image  47 and coronal image 64 is likely atelectasis given its appearance. Bibasilar atelectasis identified. Mild edema. No infiltrate to suggest pneumonia. There is a nodule in the left base on series 7, image 51 measuring 6 mm today, unchanged since 2012, requiring no follow-up. A nodule in the right base on series 7, image 39 is stable as well. No new nodules or masses. Upper Abdomen: No acute abnormality. Musculoskeletal: No chest wall abnormality. No acute or significant osseous findings. Review of the MIP images confirms the above findings. IMPRESSION: 1. Cardiomegaly, scattered atelectasis, and mild edema. No convincing evidence of pneumonia. 2. No pulmonary emboli identified. 3. No other acute abnormalities. Electronically Signed   By: Gerome Sam III M.D   On: 05/27/2017 16:48     Medications:     Current Medications: . aspirin  81 mg Oral Daily  . enoxaparin (LOVENOX) injection  40 mg Subcutaneous Q24H  . insulin aspart protamine- aspart  45 Units Subcutaneous BID WC  . lisinopril  5 mg Oral Daily  . multivitamin with minerals  1 tablet Oral Daily  . nicotine  21 mg Transdermal QHS  . nystatin   Topical TID  . [START ON 05/29/2017] predniSONE  40 mg Oral Q breakfast  . sodium chloride flush  3 mL Intravenous Q12H     Infusions:   Patient Profile   Felicia [name] is a 57 y.o. female with h/o of obesity, tobacco abuse, COPD, IDDM, HTN, NICM.  Presented to [facility] with 2 weeks of worsening SOB. Echo with newly decreased EF to 50-55%.   Assessment/Plan   1. Acute systolic CHF - History of NICM. Echo in 2013 with normal EF - Echo 05/28/17 LVEF 25-30%, Trivial MR, Mild TR, PA peak pressure 40 mm Hg, elevated CVP. - NYHA IIIb symptoms. - Volume status difficult - Give 40 mg IV lasix now, and 40 in am. Will adjust further based on cath results.  - No BB for now with acute decompensation - Stop lisinopril 5 mg daily.  ->  Start losartan 50 mg BID in anticipation of starting Entresto.  - Add spiro 12.5 mg daily.  - Add digoxin 0.125 mg daily.  - Plan [facility] for tomorrow.  - Will check cMRI based on [facility] results.    2. Acute hypoxic respiratory failure - Requiring 3L O2 by [place]  3. AECOPD - Continue steroids and nebs.  - No ABX at this time with no productive cough/sputum.  4. Tobacco Abuse - 1 ppd history PTA.  - Encouraged complete cessation  5. IDDM - HgB A1c 8.6 05/28/17 - Per primary.   6. HTN - Will adjust meds in setting of treating her CHF  Medication concerns reviewed with patient and pharmacy team. Barriers identified: None at this time  Length of Stay: 1  Graciella Freer, PA-C  05/28/2017, 12:17 PM  Advanced Heart Failure Team Pager [phone] (M-F; 7a - 4p)  Please contact [facility] for night-coverage after hours (4p -7a ) and weekends on [email or web address]  Patient seen and examined with the above-signed Advanced Practice Provider and/or Housestaff. I personally reviewed laboratory data, imaging studies and relevant notes. I independently examined the patient and formulated the important aspects of the plan. I have edited the note to reflect any of my changes or salient points. I have personally discussed the plan with the patient and/or family.  57 y/o woman with morbid obesity, ongoing tobacco use, severe HTN, untreated OSA, DM and previous NICM presents with recurrent systolic HF and  exertional chest pressure. Echo reviewed personally and EF 25%. On exam JVP does not seem markedly elevated. No s3. Lungs clear. Ab markedly obese. Ext warm with no edema.   Suspect low EF likely due to untreated OSA and poorly-controlled HTN but has multiple CRFs with exertional CP so will need repeat cath. Will schedule for tomorrow at 1pm. Continue to titrate HF meds with switch to [facility]. Discussed need for outpatient sleeps study, weight loss and smoking cessation.   Arvilla Meres, MD  2:16  PM

## 2017-05-28 NOTE — Progress Notes (Signed)
Nutrition Consult/Brief Note  RD consulted per COPD Gold Protocol.  Wt Readings from Last 15 Encounters:  05/28/17 199 lb 1.2 oz (90.3 kg)  08/17/15 155 lb 9 oz (70.6 kg)  01/14/15 158 lb 11.7 oz (72 kg)  01/08/15 152 lb 9.6 oz (69.2 kg)  12/20/14 158 lb 14.4 oz (72.1 kg)  06/30/14 147 lb (66.7 kg)  02/15/14 148 lb (67.1 kg)  01/11/14 147 lb (66.7 kg)  11/05/13 148 lb (67.1 kg)  10/02/13 144 lb (65.3 kg)  07/05/13 144 lb (65.3 kg)  04/02/13 146 lb (66.2 kg)  01/11/13 151 lb (68.5 kg)  09/24/12 170 lb (77.1 kg)  08/14/12 181 lb (82.1 kg)   Body mass index is 40.21 kg/m. Patient meets criteria for Obesity Class III based on current BMI.   Current diet order is Heart Healthy/Carbohydrate Modified, patient is consuming approximately 100% of meals at this time. Labs and medications reviewed.   No nutrition interventions warranted at this time. If nutrition issues arise, please consult RD.   Maureen Chatters, RD, LDN Pager #: [phone] After-Hours Pager #: [phone]

## 2017-05-29 ENCOUNTER — Inpatient Hospital Stay (HOSPITAL_COMMUNITY)
Admission: EM | Disposition: A | Discharge status: Home / Self Care | Payer: Self-pay | Source: Home / Self Care | Attending: Internal Medicine

## 2017-05-29 DIAGNOSIS — I2510 Atherosclerotic heart disease of native coronary artery without angina pectoris: Secondary | ICD-10-CM

## 2017-05-29 DIAGNOSIS — I5021 Acute systolic (congestive) heart failure: Secondary | ICD-10-CM

## 2017-05-29 HISTORY — PX: RIGHT/LEFT HEART CATH AND CORONARY ANGIOGRAPHY: CATH118266

## 2017-05-29 LAB — POCT I-STAT 3, VENOUS BLOOD GAS (G3P V)
Acid-Base Excess: 4 mmol/L — ABNORMAL HIGH (ref 0.0–2.0)
Acid-Base Excess: 7 mmol/L — ABNORMAL HIGH (ref 0.0–2.0)
Bicarbonate: 29.1 mmol/L — ABNORMAL HIGH (ref 20.0–28.0)
Bicarbonate: 33.1 mmol/L — ABNORMAL HIGH (ref 20.0–28.0)
O2 SAT: 69 %
O2 Saturation: 65 %
PCO2 VEN: 47.3 mmHg (ref 44.0–60.0)
TCO2: 31 mmol/L (ref 22–32)
TCO2: 35 mmol/L — AB (ref 22–32)
pCO2, Ven: 54.4 mmHg (ref 44.0–60.0)
pH, Ven: 7.393 (ref 7.250–7.430)
pH, Ven: 7.397 (ref 7.250–7.430)
pO2, Ven: 35 mmHg (ref 32.0–45.0)
pO2, Ven: 36 mmHg (ref 32.0–45.0)

## 2017-05-29 LAB — BASIC METABOLIC PANEL
Anion gap: 10 (ref 5–15)
BUN: 32 mg/dL — AB (ref 6–20)
CALCIUM: 9 mg/dL (ref 8.9–10.3)
CHLORIDE: 101 mmol/L (ref 101–111)
CO2: 32 mmol/L (ref 22–32)
CREATININE: 0.8 mg/dL (ref 0.44–1.00)
GFR calc non Af Amer: >60 mL/min (ref >60)
Glucose, Bld: 76 mg/dL (ref 65–99)
Potassium: 3.8 mmol/L (ref 3.5–5.1)
SODIUM: 143 mmol/L (ref 135–145)

## 2017-05-29 LAB — POCT I-STAT 3, ART BLOOD GAS (G3+)
ACID-BASE EXCESS: 6 mmol/L — AB (ref 0.0–2.0)
BICARBONATE: 31.6 mmol/L — AB (ref 20.0–28.0)
O2 Saturation: 93 %
TCO2: 33 mmol/L — ABNORMAL HIGH (ref 22–32)
pCO2 arterial: 49.9 mmHg — ABNORMAL HIGH (ref 32.0–48.0)
pH, Arterial: 7.41 (ref 7.350–7.450)
pO2, Arterial: 69 mmHg — ABNORMAL LOW (ref 83.0–108.0)

## 2017-05-29 LAB — GLUCOSE, CAPILLARY
GLUCOSE-CAPILLARY: 57 mg/dL — AB (ref 65–99)
GLUCOSE-CAPILLARY: 144 mg/dL — AB (ref 65–99)
GLUCOSE-CAPILLARY: 270 mg/dL — AB (ref 65–99)
Glucose-Capillary: 367 mg/dL — ABNORMAL HIGH (ref 65–99)
Glucose-Capillary: 86 mg/dL (ref 65–99)
Glucose-Capillary: 90 mg/dL (ref 65–99)
Glucose-Capillary: 125 mg/dL — ABNORMAL HIGH (ref 65–99)

## 2017-05-29 LAB — MAGNESIUM: Magnesium: 2.2 mg/dL (ref 1.7–2.4)

## 2017-05-29 SURGERY — RIGHT/LEFT HEART CATH AND CORONARY ANGIOGRAPHY
Anesthesia: LOCAL

## 2017-05-29 MED ORDER — SODIUM CHLORIDE 0.9 % IV SOLN: INTRAVENOUS | Status: DC

## 2017-05-29 MED ORDER — DEXTROSE 50 % IV SOLN: 25.0000 mL | Freq: Once | INTRAVENOUS | Status: AC

## 2017-05-29 MED ORDER — TRAZODONE HCL 50 MG PO TABS
50.0000 mg | ORAL_TABLET | Freq: Every evening | ORAL | Status: DC | PRN
Start: 2017-05-29 — End: 2017-05-30
  Administered 2017-05-29: 50 mg via ORAL
  Filled 2017-05-29: qty 1

## 2017-05-29 MED ORDER — SODIUM CHLORIDE 0.9% FLUSH: 3.0000 mL | INTRAVENOUS | Status: DC | PRN

## 2017-05-29 MED ORDER — HEPARIN (PORCINE) IN NACL 1000-0.9 UT/500ML-% IV SOLN
INTRAVENOUS | Status: AC
  Filled 2017-05-29: qty 500

## 2017-05-29 MED ORDER — VERAPAMIL HCL 2.5 MG/ML IV SOLN
INTRAVENOUS | Status: DC | PRN
  Administered 2017-05-29: 16:00:00 via INTRA_ARTERIAL

## 2017-05-29 MED ORDER — IOHEXOL 350 MG/ML SOLN
INTRAVENOUS | Status: DC | PRN
  Administered 2017-05-29: 30 mL via INTRACARDIAC

## 2017-05-29 MED ORDER — ONDANSETRON HCL 4 MG/2ML IJ SOLN: 4.0000 mg | Freq: Four times a day (QID) | INTRAMUSCULAR | Status: DC | PRN

## 2017-05-29 MED ORDER — SODIUM CHLORIDE 0.9% FLUSH
3.0000 mL | Freq: Two times a day (BID) | INTRAVENOUS | Status: DC
  Administered 2017-05-29 – 2017-05-30 (×2): 3 mL via INTRAVENOUS

## 2017-05-29 MED ORDER — ACETAMINOPHEN 325 MG PO TABS
650.0000 mg | ORAL_TABLET | ORAL | Status: DC | PRN
  Administered 2017-05-29 – 2017-05-30 (×2): 650 mg via ORAL
  Filled 2017-05-29 (×2): qty 2

## 2017-05-29 MED ORDER — SODIUM CHLORIDE 0.9 % IV SOLN: INTRAVENOUS | Status: AC

## 2017-05-29 MED ORDER — SODIUM CHLORIDE 0.9% FLUSH
3.0000 mL | Freq: Two times a day (BID) | INTRAVENOUS | Status: DC
  Administered 2017-05-29 (×2): 3 mL via INTRAVENOUS

## 2017-05-29 MED ORDER — VERAPAMIL HCL 2.5 MG/ML IV SOLN
INTRAVENOUS | Status: AC
  Filled 2017-05-29: qty 2

## 2017-05-29 MED ORDER — HEPARIN SODIUM (PORCINE) 1000 UNIT/ML IJ SOLN
INTRAMUSCULAR | Status: AC
  Filled 2017-05-29: qty 1

## 2017-05-29 MED ORDER — DEXTROSE 50 % IV SOLN
INTRAVENOUS | Status: AC
  Administered 2017-05-29: 25 mL
  Filled 2017-05-29: qty 50

## 2017-05-29 MED ORDER — HEPARIN (PORCINE) IN NACL 2-0.9 UNITS/ML
INTRAMUSCULAR | Status: AC | PRN
  Administered 2017-05-29 (×2): 500 mL

## 2017-05-29 MED ORDER — HEPARIN SODIUM (PORCINE) 1000 UNIT/ML IJ SOLN
INTRAMUSCULAR | Status: DC | PRN
  Administered 2017-05-29: 4500 [IU] via INTRAVENOUS

## 2017-05-29 MED ORDER — SODIUM CHLORIDE 0.9 % IV SOLN: 250.0000 mL | INTRAVENOUS | Status: DC | PRN

## 2017-05-29 MED ORDER — TRAZODONE HCL 50 MG PO TABS: 50.0000 mg | ORAL_TABLET | Freq: Once | ORAL | Status: DC

## 2017-05-29 MED ORDER — LIDOCAINE HCL (PF) 1 % IJ SOLN
INTRAMUSCULAR | Status: AC
  Filled 2017-05-29: qty 30

## 2017-05-29 MED ORDER — LIDOCAINE HCL (PF) 1 % IJ SOLN
INTRAMUSCULAR | Status: DC | PRN
  Administered 2017-05-29 (×2): 2 mL

## 2017-05-29 MED ORDER — ENOXAPARIN SODIUM 40 MG/0.4ML ~~LOC~~ SOLN
40.0000 mg | SUBCUTANEOUS | Status: DC
  Administered 2017-05-30: 40 mg via SUBCUTANEOUS
  Filled 2017-05-29 (×2): qty 0.4

## 2017-05-29 SURGICAL SUPPLY — 14 items
CATH BALLN WEDGE 5F 110CM (CATHETERS) ×1 IMPLANT
CATH INFINITI 5 FR JL3.5 (CATHETERS) ×1 IMPLANT
CATH INFINITI JR4 5F (CATHETERS) ×1 IMPLANT
COVER PRB 48X5XTLSCP FOLD TPE (BAG) IMPLANT
COVER PROBE 5X48 (BAG) ×2
DEVICE RAD COMP TR BAND LRG (VASCULAR PRODUCTS) ×1 IMPLANT
GLIDESHEATH SLEND SS 6F .021 (SHEATH) ×1 IMPLANT
GUIDEWIRE INQWIRE 1.5J.035X260 (WIRE) IMPLANT
INQWIRE 1.5J .035X260CM (WIRE) ×2
KIT HEART LEFT (KITS) ×2 IMPLANT
PACK CARDIAC CATHETERIZATION (CUSTOM PROCEDURE TRAY) ×2 IMPLANT
SHEATH GLIDE SLENDER 4/5FR (SHEATH) ×1 IMPLANT
TRANSDUCER W/STOPCOCK (MISCELLANEOUS) ×2 IMPLANT
TUBING CIL FLEX 10 FLL-RA (TUBING) ×2 IMPLANT

## 2017-05-29 NOTE — Interval H&P Note (Signed)
History and Physical Interval Note:  05/29/2017 3:55 PM  Felicia [name]  has presented today for surgery, with the diagnosis of HF  The various methods of treatment have been discussed with the patient and family. After consideration of risks, benefits and other options for treatment, the patient has consented to  Procedure(s): RIGHT/LEFT HEART CATH AND CORONARY ANGIOGRAPHY (N/A) and possible coronary angioplasty as a surgical intervention .  The patient's history has been reviewed, patient examined, no change in status, stable for surgery.  I have reviewed the patient's chart and labs.  Questions were answered to the patient's satisfaction.     Keilon Ressel

## 2017-05-29 NOTE — Progress Notes (Signed)
Results for EMONIE, POLIVKA (MRN [id number]) as of 05/29/2017 14:03  Ref. Range 05/28/2017 12:09 05/28/2017 21:17 05/29/2017 07:57 05/29/2017 08:40 05/29/2017 12:10  Glucose-Capillary Latest Ref Range: 65 - 99 mg/dL 086 (H) 578 (H) 57 (L) 144 (H) 86  Noted that patient had a CBG of 57 mg/dl this am. Patient was NPO this am.  Recommend decreasing 70/30 insulin dosage to 42 units BID if blood sugars continue to be less than 70 mg/dl.    Smith Mince RN BSN CDE Diabetes Coordinator Pager: [phone]  8am-5pm

## 2017-05-29 NOTE — Progress Notes (Signed)
PROGRESS NOTE    Felicia [name]  ZOX:[id number] DOB: 05-06-1960 DOA: 05/27/2017 PCP: Patient, No Pcp Per   Brief Narrative: Patient is a 57 year old female with past medical history of obesity, tobacco abuse, COPD, insulin-dependent diabetes meds, hypertension, nonischemic cardiomyopathy presents to the emergency department with 2-week history of shortness of breath and wheezing.  Patient found to have wheezes and crackles on examination.  Patient admitted for further management of COPD and CHF exacerbation.  Echocardiogram showed severe reduction in ejection fraction in the range of 20 to 25%.  Cardiology consulted.  Plan is to do cardiac cath today.  Assessment & Plan:   Principal Problem:   Acute respiratory failure with hypoxia (HCC) Active Problems:   Nonischemic cardiomyopathy (HCC)   Hypertension   Uncontrolled diabetes mellitus (HCC)   COPD with acute exacerbation (HCC)   Obesity   Tobacco use  Acute respiratory failure with hypoxia: Most likely secondary to COPD/CHF exacerbation, possible pulmonary edema.  Currently her respiratory status stable..  Patient does not use oxygen at home.  History of nonischemic cardiomyopathy/severe systolic heart failure; Currently euvolemic.  Does not have any peripheral edema.  Lungs are clear.  She was given few doses of Lasix. Echo showed EF of 25-30%.  Mild left ventricular hypertrophy.  Severely reduced systolic function.  Diffuse hypokinesis. Her echocardiogram on 06/04/2011 showed ejection fraction of 50 to 55% Cardiology consulted and she is undergoing cardiac catheterization today. Also started on losartan, Spironolactone, digoxin.Not started on beta-blockers for now.  COPD exacerbation: Much improved .  Patient does not have any significant wheezes today on examination.  We have changed the steroids to oral. She did not have a PCP currently.  She needs to follow-up as an outpatient.  We will try to arrange DME nebulizer machine on  discharge.  She needs to follow-up with pulmonology outpatient for possible pulmonary function test.  Smoker: Continue nicotine patch. Encouraged tobacco cessation.  Insulin-dependent diabetes mellitus: Hemoglobin A1c of 8.6.  Patient is on insulin 70/30 at home and takes 35 units twice a day.  She says she has been compliant with her insulin administration.  Might need to increase the dose of insulin on discharge.  LDL of 103. Diabetic coordinator following.  Hypertension: BP stable this morning.  DVT prophylaxis: Lovenox Code Status: Full Family Communication: None on the bedside Disposition Plan: Likely home after cardiac cath   Consultants: Cardiology  Procedures: Echocardiogram   Antimicrobials: None  Subjective:  Patient seen and examined at bedside this morning.  Remains comfortable.  Not in respiratory distress.   Objective: Vitals:   05/28/17 1658 05/29/17 0019 05/29/17 0500 05/29/17 0843  BP: 137/80 114/62  139/89  Pulse: (!) 106 85  84  Resp: 19 (!) 22  18  Temp: 98.3 F (36.8 C) 97.9 F (36.6 C)  97.9 F (36.6 C)  TempSrc: Oral Oral  Oral  SpO2: 96% 95%  95%  Weight:   91.4 kg (201 lb 8 oz)   Height:        Intake/Output Summary (Last 24 hours) at 05/29/2017 1351 Last data filed at 05/28/2017 2000 Gross per 24 hour  Intake 240 ml  Output -  Net 240 ml   Filed Weights   05/28/17 0300 05/28/17 1210 05/29/17 0500  Weight: 90.3 kg (199 lb 1.2 oz) 90.3 kg (199 lb 1.2 oz) 91.4 kg (201 lb 8 oz)    Examination:  General exam: Appears calm and comfortable ,Not in distress,obese HEENT:PERRL,Oral mucosa moist, Ear/Nose normal on  gross exam Respiratory system: Bilateral equal air entry, normal vesicular breath sounds, no wheezes or crackles  Cardiovascular system: S1 & S2 heard, RRR. No JVD, murmurs, rubs, gallops or clicks. No pedal edema. Gastrointestinal system: Abdomen is nondistended, soft and nontender. No organomegaly or masses felt. Normal bowel sounds  heard. Central nervous system: Alert and oriented. No focal neurological deficits. Extremities: No edema, no clubbing ,no cyanosis, distal peripheral pulses palpable. Skin: No rashes, lesions or ulcers,no icterus ,no pallor MSK: Normal muscle bulk,tone ,power Psychiatry: Judgement and insight appear normal. Mood & affect appropriate.     Data Reviewed: I have personally reviewed following labs and imaging studies  CBC: Recent Labs  Lab 05/27/17 0918  WBC 11.3*  HGB 13.5  HCT 42.2  MCV 91.9  PLT 253   Basic Metabolic Panel: Recent Labs  Lab 05/27/17 0918 05/28/17 0230 05/29/17 0653  NA 139 136 143  K 3.6 3.7 3.8  CL 102 96* 101  CO2 27 29 32  GLUCOSE 276* 438* 76  BUN 11 16 32*  CREATININE 0.73 0.96 0.80  CALCIUM 8.8* 9.2 9.0  MG  --   --  2.2   GFR: Estimated Creatinine Clearance: 78.4 mL/min (by C-G formula based on SCr of 0.8 mg/dL). Liver Function Tests: No results for input(s): AST, ALT, ALKPHOS, BILITOT, PROT, ALBUMIN in the last 168 hours. No results for input(s): LIPASE, AMYLASE in the last 168 hours. No results for input(s): AMMONIA in the last 168 hours. Coagulation Profile: Recent Labs  Lab 05/28/17 1427  INR 1.05   Cardiac Enzymes: No results for input(s): CKTOTAL, CKMB, CKMBINDEX, TROPONINI in the last 168 hours. BNP (last 3 results) No results for input(s): PROBNP in the last 8760 hours. HbA1C: Recent Labs    05/28/17 0230  HGBA1C 8.6*   CBG: Recent Labs  Lab 05/28/17 1209 05/28/17 2117 05/29/17 0757 05/29/17 0840 05/29/17 1210  GLUCAP 347* 179* 57* 144* 86   Lipid Profile: Recent Labs    05/28/17 0230  CHOL 169  HDL 53  LDLCALC 103*  TRIG 64  CHOLHDL 3.2   Thyroid Function Tests: No results for input(s): TSH, T4TOTAL, FREET4, T3FREE, THYROIDAB in the last 72 hours. Anemia Panel: No results for input(s): VITAMINB12, FOLATE, FERRITIN, TIBC, IRON, RETICCTPCT in the last 72 hours. Sepsis Labs: No results for input(s):  PROCALCITON, LATICACIDVEN in the last 168 hours.  Recent Results (from the past 240 hour(s))  MRSA PCR Screening     Status: None   Collection Time: 05/28/17 11:41 AM  Result Value Ref Range Status   MRSA by PCR NEGATIVE NEGATIVE Final    Comment:        The GeneXpert MRSA Assay (FDA approved for NASAL specimens only), is one component of a comprehensive MRSA colonization surveillance program. It is not intended to diagnose MRSA infection nor to guide or monitor treatment for MRSA infections. Performed at [facility], [address], [place], [place] [address]          Radiology Studies: Ct Angio Chest Pe W And/or Wo Contrast  Result Date: 05/27/2017 CLINICAL DATA:  Centralized chest pain.  Shortness of breath. EXAM: CT ANGIOGRAPHY CHEST WITH CONTRAST TECHNIQUE: Multidetector CT imaging of the chest was performed using the standard protocol during bolus administration of intravenous contrast. Multiplanar CT image reconstructions and MIPs were obtained to evaluate the vascular anatomy. CONTRAST:  ISOVUE-370 IOPAMIDOL (ISOVUE-370) INJECTION 76% COMPARISON:  Chest CT Jun 18, 2010.  Chest x-ray May 27, 2017.  FINDINGS: Cardiovascular: Cardiomegaly. Coronary artery calcifications. The thoracic aorta is nonaneurysmal with no dissection. No significant atherosclerotic change. The central pulmonary arteries are normal in caliber. No pulmonary emboli. Mediastinum/Nodes: The thyroid and esophagus are normal. No adenopathy. A few shotty nodes in the retrocrural space on axial image 66 of in stable since 2012, of no acute significance. No effusions. No chest wall abnormalities identified. Lungs/Pleura: The central airways are normal. No pneumothorax. Linear opacity in the lateral right lung such as on series 7, image 47 and coronal image 64 is likely atelectasis given its appearance. Bibasilar atelectasis identified. Mild edema. No infiltrate to suggest pneumonia. There is a nodule in the  left base on series 7, image 51 measuring 6 mm today, unchanged since 2012, requiring no follow-up. A nodule in the right base on series 7, image 39 is stable as well. No new nodules or masses. Upper Abdomen: No acute abnormality. Musculoskeletal: No chest wall abnormality. No acute or significant osseous findings. Review of the MIP images confirms the above findings. IMPRESSION: 1. Cardiomegaly, scattered atelectasis, and mild edema. No convincing evidence of pneumonia. 2. No pulmonary emboli identified. 3. No other acute abnormalities. Electronically Signed   By: Gerome Sam III M.D   On: 05/27/2017 16:48        Scheduled Meds: . aspirin  81 mg Oral Daily  . digoxin  0.125 mg Oral Daily  . enoxaparin (LOVENOX) injection  40 mg Subcutaneous Q24H  . insulin aspart protamine- aspart  45 Units Subcutaneous BID WC  . losartan  50 mg Oral BID  . multivitamin with minerals  1 tablet Oral Daily  . nicotine  21 mg Transdermal QHS  . nystatin   Topical TID  . predniSONE  40 mg Oral Q breakfast  . sodium chloride flush  3 mL Intravenous Q12H  . sodium chloride flush  3 mL Intravenous Q12H  . spironolactone  12.5 mg Oral Daily   Continuous Infusions: . sodium chloride    . sodium chloride       LOS: 2 days    Time spent: 25 mins.More than 50% of that time was spent in counseling and/or coordination of care.      Burnadette Pop, MD [facility] Pager [phone]  If 7PM-7AM, please contact night-coverage [email or web address] Password [email or web address] 05/29/2017, 1:51 PM

## 2017-05-29 NOTE — H&P (View-Only) (Signed)
Advanced Heart Failure Rounding Note  PCP-Cardiologist: No primary care provider on file.   Subjective:     Says her breathing is ok. Denies orthopnea or PND.   Weight shows up 2 lbs. No output recorded. Cr 0.80 and K 3.8  Objective:   Weight Range: 201 lb 8 oz (91.4 kg) Body mass index is 40.7 kg/m.   Vital Signs:   Temp:  [97.9 F (36.6 C)-98.3 F (36.8 C)] 97.9 F (36.6 C) (05/09 0843) Pulse Rate:  [84-106] 84 (05/09 0843) Resp:  [18-22] 18 (05/09 0843) BP: (114-139)/(62-89) 139/89 (05/09 0843) SpO2:  [95 %-96 %] 95 % (05/09 0843) Weight:  [201 lb 8 oz (91.4 kg)] 201 lb 8 oz (91.4 kg) (05/09 0500) Last BM Date: 05/28/17  Weight change: Filed Weights   05/28/17 0300 05/28/17 1210 05/29/17 0500  Weight: 199 lb 1.2 oz (90.3 kg) 199 lb 1.2 oz (90.3 kg) 201 lb 8 oz (91.4 kg)    Intake/Output:   Intake/Output Summary (Last 24 hours) at 05/29/2017 1218 Last data filed at 05/28/2017 2000 Gross per 24 hour  Intake 240 ml  Output -  Net 240 ml      Physical Exam    General:  No resp difficulty. Small stature. HEENT: Normal Neck: Supple. JVP difficult due to habitus . Carotids 2+ bilat; no bruits. No lymphadenopathy or thyromegaly appreciated. Cor: PMI nondisplaced. Regular rate & rhythm. No rubs, gallops or murmurs. Lungs: Diminished basilar sounds. No wheeze.  Abdomen: morbidly obese Soft, nontender, nondistended. No hepatosplenomegaly. No bruits or masses. Good bowel sounds. Extremities: No cyanosis, clubbing, or rash. No peripheral edema.  Neuro: Alert & orientedx3, cranial nerves grossly intact. moves all 4 extremities w/o difficulty. Affect pleasant   Telemetry   NSR 80-90s, personally reviewed  EKG    No new tracings.    Labs    CBC Recent Labs    05/27/17 0918  WBC 11.3*  HGB 13.5  HCT 42.2  MCV 91.9  PLT 253   Basic Metabolic Panel Recent Labs    36/64/40 0230 05/29/17 0653  NA 136 143  K 3.7 3.8  CL 96* 101  CO2 29 32  GLUCOSE  438* 76  BUN 16 32*  CREATININE 0.96 0.80  CALCIUM 9.2 9.0  MG  --  2.2   Liver Function Tests No results for input(s): AST, ALT, ALKPHOS, BILITOT, PROT, ALBUMIN in the last 72 hours. No results for input(s): LIPASE, AMYLASE in the last 72 hours. Cardiac Enzymes No results for input(s): CKTOTAL, CKMB, CKMBINDEX, TROPONINI in the last 72 hours.  BNP: BNP (last 3 results) Recent Labs    05/27/17 1333  BNP 271.6*    ProBNP (last 3 results) No results for input(s): PROBNP in the last 8760 hours.   D-Dimer Recent Labs    05/27/17 1333  DDIMER 0.93*   Hemoglobin A1C Recent Labs    05/28/17 0230  HGBA1C 8.6*   Fasting Lipid Panel Recent Labs    05/28/17 0230  CHOL 169  HDL 53  LDLCALC 103*  TRIG 64  CHOLHDL 3.2   Thyroid Function Tests No results for input(s): TSH, T4TOTAL, T3FREE, THYROIDAB in the last 72 hours.  Invalid input(s): FREET3  Other results:   Imaging     No results found.   Medications:     Scheduled Medications: . aspirin  81 mg Oral Daily  . digoxin  0.125 mg Oral Daily  . enoxaparin (LOVENOX) injection  40 mg Subcutaneous Q24H  .  insulin aspart protamine- aspart  45 Units Subcutaneous BID WC  . losartan  50 mg Oral BID  . multivitamin with minerals  1 tablet Oral Daily  . nicotine  21 mg Transdermal QHS  . nystatin   Topical TID  . predniSONE  40 mg Oral Q breakfast  . sodium chloride flush  3 mL Intravenous Q12H  . sodium chloride flush  3 mL Intravenous Q12H  . spironolactone  12.5 mg Oral Daily     Infusions: . sodium chloride    . sodium chloride       PRN Medications:  sodium chloride, acetaminophen **OR** acetaminophen, albuterol, bismuth subsalicylate, diphenhydrAMINE, hydrALAZINE, ondansetron **OR** ondansetron (ZOFRAN) IV, sodium chloride flush    Patient Profile   Felicia [name] is a 57 y.o. female with h/o of obesity, tobacco abuse, COPD, IDDM, HTN, NICM.  Presented to [facility] with 2 weeks of  worsening SOB. Echo with newly decreased EF to 50-55%.   Assessment/Plan   1. Acute systolic CHF - History of NICM. Echo in 2013 with normal EF - Echo 05/28/17 LVEF 25-30%, Trivial MR, Mild TR, PA peak pressure 40 mm Hg, elevated CVP. - Volume status difficult with body habitus. Weight is up 2 pounds - Received lasix 40 mg this am. Will adjust further based on cath results.  - No BB for now with acute decompensation - Continue losartan 50 mg BID. Plan to start [facility] tomorrow am.  - Continue spiro 12.5 mg daily.  - Continue digoxin 0.125 mg daily.  - To proceed with R/LHC this afternoon with Dr. Gala Romney. She is too large for cMRI   2. Acute hypoxic respiratory failure - Requiring 3L O2 by [place] - Hope this will improved with continued diuresis.  - Needs outpatient sleep study  3. AECOPD - Continue steroids and nebs.  - No ABX at this time with no productive cough/sputum. - No change to current plan.    4. Tobacco Abuse - 1 ppd history PTA.  - Encouraged complete cessation.   5. IDDM - HgB A1c 8.6 05/28/17 - Per primary.   6. HTN - Meds as above.   Medication concerns reviewed with patient and pharmacy team. Barriers identified: None at this time.   Length of Stay: 2  [name]  05/29/2017, 12:18 PM  Advanced Heart Failure Team Pager [phone] (M-F; 7a - 4p)  Please contact [facility] for night-coverage after hours (4p -7a ) and weekends on [email or web address]  Patient seen and examined with the above-signed Advanced Practice Provider and/or Housestaff. I personally reviewed laboratory data, imaging studies and relevant notes. I independently examined the patient and formulated the important aspects of the plan. I have edited the note to reflect any of my changes or salient points. I have personally discussed the plan with the patient and/or family.  57 y/o woman with morbid obesity and multiple CRFs admitted with reccurrent systolic HF and CP. Volume  status hard to assess. Needs sleepstudy. R/L cath today.   Arvilla Meres, MD  3:53 PM

## 2017-05-29 NOTE — Progress Notes (Signed)
  Advanced Heart Failure Rounding Note  PCP-Cardiologist: No primary care provider on file.   Subjective:     Says her breathing is ok. Denies orthopnea or PND.   Weight shows up 2 lbs. No output recorded. Cr 0.80 and K 3.8  Objective:   Weight Range: 201 lb 8 oz (91.4 kg) Body mass index is 40.7 kg/m.   Vital Signs:   Temp:  [97.9 F (36.6 C)-98.3 F (36.8 C)] 97.9 F (36.6 C) (05/09 0843) Pulse Rate:  [84-106] 84 (05/09 0843) Resp:  [18-22] 18 (05/09 0843) BP: (114-139)/(62-89) 139/89 (05/09 0843) SpO2:  [95 %-96 %] 95 % (05/09 0843) Weight:  [201 lb 8 oz (91.4 kg)] 201 lb 8 oz (91.4 kg) (05/09 0500) Last BM Date: 05/28/17  Weight change: Filed Weights   05/28/17 0300 05/28/17 1210 05/29/17 0500  Weight: 199 lb 1.2 oz (90.3 kg) 199 lb 1.2 oz (90.3 kg) 201 lb 8 oz (91.4 kg)    Intake/Output:   Intake/Output Summary (Last 24 hours) at 05/29/2017 1218 Last data filed at 05/28/2017 2000 Gross per 24 hour  Intake 240 ml  Output -  Net 240 ml      Physical Exam    General:  No resp difficulty. Small stature. HEENT: Normal Neck: Supple. JVP difficult due to habitus . Carotids 2+ bilat; no bruits. No lymphadenopathy or thyromegaly appreciated. Cor: PMI nondisplaced. Regular rate & rhythm. No rubs, gallops or murmurs. Lungs: Diminished basilar sounds. No wheeze.  Abdomen: morbidly obese Soft, nontender, nondistended. No hepatosplenomegaly. No bruits or masses. Good bowel sounds. Extremities: No cyanosis, clubbing, or rash. No peripheral edema.  Neuro: Alert & orientedx3, cranial nerves grossly intact. moves all 4 extremities w/o difficulty. Affect pleasant   Telemetry   NSR 80-90s, personally reviewed  EKG    No new tracings.    Labs    CBC Recent Labs    05/27/17 0918  WBC 11.3*  HGB 13.5  HCT 42.2  MCV 91.9  PLT 253   Basic Metabolic Panel Recent Labs    05/28/17 0230 05/29/17 0653  NA 136 143  K 3.7 3.8  CL 96* 101  CO2 29 32  GLUCOSE  438* 76  BUN 16 32*  CREATININE 0.96 0.80  CALCIUM 9.2 9.0  MG  --  2.2   Liver Function Tests No results for input(s): AST, ALT, ALKPHOS, BILITOT, PROT, ALBUMIN in the last 72 hours. No results for input(s): LIPASE, AMYLASE in the last 72 hours. Cardiac Enzymes No results for input(s): CKTOTAL, CKMB, CKMBINDEX, TROPONINI in the last 72 hours.  BNP: BNP (last 3 results) Recent Labs    05/27/17 1333  BNP 271.6*    ProBNP (last 3 results) No results for input(s): PROBNP in the last 8760 hours.   D-Dimer Recent Labs    05/27/17 1333  DDIMER 0.93*   Hemoglobin A1C Recent Labs    05/28/17 0230  HGBA1C 8.6*   Fasting Lipid Panel Recent Labs    05/28/17 0230  CHOL 169  HDL 53  LDLCALC 103*  TRIG 64  CHOLHDL 3.2   Thyroid Function Tests No results for input(s): TSH, T4TOTAL, T3FREE, THYROIDAB in the last 72 hours.  Invalid input(s): FREET3  Other results:   Imaging     No results found.   Medications:     Scheduled Medications: . aspirin  81 mg Oral Daily  . digoxin  0.125 mg Oral Daily  . enoxaparin (LOVENOX) injection  40 mg Subcutaneous Q24H  .   insulin aspart protamine- aspart  45 Units Subcutaneous BID WC  . losartan  50 mg Oral BID  . multivitamin with minerals  1 tablet Oral Daily  . nicotine  21 mg Transdermal QHS  . nystatin   Topical TID  . predniSONE  40 mg Oral Q breakfast  . sodium chloride flush  3 mL Intravenous Q12H  . sodium chloride flush  3 mL Intravenous Q12H  . spironolactone  12.5 mg Oral Daily     Infusions: . sodium chloride    . sodium chloride       PRN Medications:  sodium chloride, acetaminophen **OR** acetaminophen, albuterol, bismuth subsalicylate, diphenhydrAMINE, hydrALAZINE, ondansetron **OR** ondansetron (ZOFRAN) IV, sodium chloride flush    Patient Profile   Felicia [name] is a 57 y.o. female with h/o of obesity, tobacco abuse, COPD, IDDM, HTN, NICM.  Presented to [facility] with 2 weeks of  worsening SOB. Echo with newly decreased EF to 50-55%.   Assessment/Plan   1. Acute systolic CHF - History of NICM. Echo in 2013 with normal EF - Echo 05/28/17 LVEF 25-30%, Trivial MR, Mild TR, PA peak pressure 40 mm Hg, elevated CVP. - Volume status difficult with body habitus. Weight is up 2 pounds - Received lasix 40 mg this am. Will adjust further based on cath results.  - No BB for now with acute decompensation - Continue losartan 50 mg BID. Plan to start Entresto tomorrow am.  - Continue spiro 12.5 mg daily.  - Continue digoxin 0.125 mg daily.  - To proceed with R/LHC this afternoon with Dr. Hatice Bubel. She is too large for cMRI   2. Acute hypoxic respiratory failure - Requiring 3L O2 by [place] - Hope this will improved with continued diuresis.  - Needs outpatient sleep study  3. AECOPD - Continue steroids and nebs.  - No ABX at this time with no productive cough/sputum. - No change to current plan.    4. Tobacco Abuse - 1 ppd history PTA.  - Encouraged complete cessation.   5. IDDM - HgB A1c 8.6 05/28/17 - Per primary.   6. HTN - Meds as above.   Medication concerns reviewed with patient and pharmacy team. Barriers identified: None at this time.   Length of Stay: 2  Michael Andrew Tillery, PA-C  05/29/2017, 12:18 PM  Advanced Heart Failure Team Pager [phone] (M-F; 7a - 4p)  Please contact [facility] for night-coverage after hours (4p -7a ) and weekends on [email or web address]  Patient seen and examined with the above-signed Advanced Practice Provider and/or Housestaff. I personally reviewed laboratory data, imaging studies and relevant notes. I independently examined the patient and formulated the important aspects of the plan. I have edited the note to reflect any of my changes or salient points. I have personally discussed the plan with the patient and/or family.  57 y/o woman with morbid obesity and multiple CRFs admitted with reccurrent systolic HF and CP. Volume  status hard to assess. Needs sleepstudy. R/L cath today.   Zachrey Deutscher, MD  3:53 PM   

## 2017-05-29 NOTE — Progress Notes (Signed)
TR BAND REMOVAL  LOCATION:    Radial right radial  DEFLATED PER PROTOCOL:   yes  TIME BAND OFF / DRESSING APPLIED:    1845/gauze and tegaderm  SITE UPON ARRIVAL:    Level 0  SITE AFTER BAND REMOVAL:    Level 0  CIRCULATION SENSATION AND MOVEMENT:    Within Normal Limits :  Yes; rt hand and fingers warm and pink; rt radial 2+  COMMENTS:

## 2017-05-30 ENCOUNTER — Encounter (HOSPITAL_COMMUNITY): Payer: Self-pay | Admitting: Internal Medicine

## 2017-05-30 LAB — BASIC METABOLIC PANEL
Anion gap: 9 (ref 5–15)
BUN: 28 mg/dL — AB (ref 6–20)
CHLORIDE: 100 mmol/L — AB (ref 101–111)
CO2: 31 mmol/L (ref 22–32)
Calcium: 8.7 mg/dL — ABNORMAL LOW (ref 8.9–10.3)
Creatinine, Ser: 1.02 mg/dL — ABNORMAL HIGH (ref 0.44–1.00)
GFR calc Af Amer: >60 mL/min (ref >60)
Glucose, Bld: 171 mg/dL — ABNORMAL HIGH (ref 65–99)
POTASSIUM: 4.1 mmol/L (ref 3.5–5.1)
Sodium: 140 mmol/L (ref 135–145)

## 2017-05-30 LAB — GLUCOSE, CAPILLARY
Glucose-Capillary: 193 mg/dL — ABNORMAL HIGH (ref 65–99)
Glucose-Capillary: 129 mg/dL — ABNORMAL HIGH (ref 65–99)

## 2017-05-30 MED ORDER — SACUBITRIL-VALSARTAN 49-51 MG PO TABS
1.0000 | ORAL_TABLET | Freq: Two times a day (BID) | ORAL | Status: DC
  Administered 2017-05-30: 1 via ORAL
  Filled 2017-05-30: qty 1

## 2017-05-30 MED ORDER — ALBUTEROL SULFATE HFA 108 (90 BASE) MCG/ACT IN AERS: 2.0000 | INHALATION_SPRAY | Freq: Four times a day (QID) | RESPIRATORY_TRACT | 0 refills | Status: DC | PRN

## 2017-05-30 MED ORDER — IPRATROPIUM-ALBUTEROL 0.5-2.5 (3) MG/3ML IN SOLN: 3.0000 mL | Freq: Four times a day (QID) | RESPIRATORY_TRACT | 0 refills | Status: DC | PRN

## 2017-05-30 MED ORDER — SACUBITRIL-VALSARTAN 49-51 MG PO TABS: 1.0000 | ORAL_TABLET | Freq: Two times a day (BID) | ORAL | 0 refills | Status: DC

## 2017-05-30 MED ORDER — CARVEDILOL 3.125 MG PO TABS: 3.1250 mg | ORAL_TABLET | Freq: Two times a day (BID) | ORAL | 0 refills | Status: DC

## 2017-05-30 MED ORDER — SPIRONOLACTONE 25 MG PO TABS: 12.5000 mg | ORAL_TABLET | Freq: Every day | ORAL | 0 refills | Status: DC

## 2017-05-30 MED ORDER — CARVEDILOL 3.125 MG PO TABS
3.1250 mg | ORAL_TABLET | Freq: Two times a day (BID) | ORAL | Status: DC
  Administered 2017-05-30: 3.125 mg via ORAL
  Filled 2017-05-30: qty 1

## 2017-05-30 MED ORDER — INSULIN NPH ISOPHANE & REGULAR (70-30) 100 UNIT/ML ~~LOC~~ SUSP: 40.0000 [IU] | Freq: Two times a day (BID) | SUBCUTANEOUS | 0 refills | Status: DC

## 2017-05-30 MED ORDER — DIGOXIN 125 MCG PO TABS: 0.1250 mg | ORAL_TABLET | Freq: Every day | ORAL | 0 refills | Status: DC

## 2017-05-30 NOTE — Progress Notes (Addendum)
Advanced Heart Failure Rounding Note  PCP-Cardiologist: No primary care provider on file.   Subjective:    Cath yesterday with well compensated hemodynamics and minimal, non-obstructive CAD.   Feeling good. Denies SOB. Gets her meds from $4 list at [facility].    Weight shows up 2 lbs. No output recorded. BMET pending.   [facility] 05/29/17  Prox LAD lesion is 20% stenosed.  Mid Cx lesion is 40% stenosed. Ao = 132/80 (100) LV = 127/19 RA = 7 RV = 38/8 PA = 39/12 (26) PCW = 14 Fick cardiac output/index = 5.3/2.9 PVR = 2.3 WU SVR = 1408 Ao sat = 93% PA sat = 66%, 68%  Assessment: 1. Co-dominant coronary system with separate ostial for LAD and LCx 2. Minimal non-obstructive CAD 3. NICM EF 25-30%  4. Well-compensated hemodynamics  Objective:   Weight Range: 203 lb 0.7 oz (92.1 kg) Body mass index is 41.01 kg/m.   Vital Signs:   Temp:  [97.4 F (36.3 C)-97.9 F (36.6 C)] 97.8 F (36.6 C) (05/10 0803) Pulse Rate:  [0-93] 83 (05/10 0803) Resp:  [0-27] 20 (05/10 0803) BP: (120-155)/(70-117) 123/87 (05/10 0803) SpO2:  [0 %-99 %] 99 % (05/10 0803) Weight:  [203 lb 0.7 oz (92.1 kg)] 203 lb 0.7 oz (92.1 kg) (05/10 0500) Last BM Date: 05/29/17  Weight change: Filed Weights   05/28/17 1210 05/29/17 0500 05/30/17 0500  Weight: 199 lb 1.2 oz (90.3 kg) 201 lb 8 oz (91.4 kg) 203 lb 0.7 oz (92.1 kg)    Intake/Output:  No intake or output data in the 24 hours ending 05/30/17 0819    Physical Exam    General: Well appearing. No resp difficulty. Small stature. HEENT: Normal anicteric  Neck: Supple. JVP difficult due to body habitus. Carotids 2+ bilat; no bruits. No thyromegaly or nodule noted. Cor: PMI laterally displaced. RRR, No M/G/R noted Lungs: CTAB, normal effort. No wheeze Abdomen:  Morbidly obese  Soft, non-tender, non-distended, no HSM. No bruits or masses. +BS  Extremities: No cyanosis, clubbing, or rash. R and LLE no edema.  Neuro: Alert & orientedx3,  cranial nerves grossly intact. moves all 4 extremities w/o difficulty. Affect pleasant   Telemetry   NSR 80-90s, personally reviewed.   EKG    No new tracings.    Labs    CBC Recent Labs    05/27/17 0918  WBC 11.3*  HGB 13.5  HCT 42.2  MCV 91.9  PLT 253   Basic Metabolic Panel Recent Labs    86/76/19 0230 05/29/17 0653  NA 136 143  K 3.7 3.8  CL 96* 101  CO2 29 32  GLUCOSE 438* 76  BUN 16 32*  CREATININE 0.96 0.80  CALCIUM 9.2 9.0  MG  --  2.2   Liver Function Tests No results for input(s): AST, ALT, ALKPHOS, BILITOT, PROT, ALBUMIN in the last 72 hours. No results for input(s): LIPASE, AMYLASE in the last 72 hours. Cardiac Enzymes No results for input(s): CKTOTAL, CKMB, CKMBINDEX, TROPONINI in the last 72 hours.  BNP: BNP (last 3 results) Recent Labs    05/27/17 1333  BNP 271.6*    ProBNP (last 3 results) No results for input(s): PROBNP in the last 8760 hours.   D-Dimer Recent Labs    05/27/17 1333  DDIMER 0.93*   Hemoglobin A1C Recent Labs    05/28/17 0230  HGBA1C 8.6*   Fasting Lipid Panel Recent Labs    05/28/17 0230  CHOL 169  HDL 53  LDLCALC 103*  TRIG 64  CHOLHDL 3.2   Thyroid Function Tests No results for input(s): TSH, T4TOTAL, T3FREE, THYROIDAB in the last 72 hours.  Invalid input(s): FREET3  Other results:   Imaging    No results found.   Medications:     Scheduled Medications: . aspirin  81 mg Oral Daily  . digoxin  0.125 mg Oral Daily  . enoxaparin (LOVENOX) injection  40 mg Subcutaneous Q24H  . insulin aspart protamine- aspart  45 Units Subcutaneous BID WC  . losartan  50 mg Oral BID  . multivitamin with minerals  1 tablet Oral Daily  . nicotine  21 mg Transdermal QHS  . nystatin   Topical TID  . predniSONE  40 mg Oral Q breakfast  . sodium chloride flush  3 mL Intravenous Q12H  . sodium chloride flush  3 mL Intravenous Q12H  . spironolactone  12.5 mg Oral Daily    Infusions: . sodium chloride       PRN Medications: sodium chloride, acetaminophen, albuterol, bismuth subsalicylate, diphenhydrAMINE, hydrALAZINE, ondansetron (ZOFRAN) IV, sodium chloride flush, traZODone    Patient Profile   Felicia [name] is a 57 y.o. female with h/o of obesity, tobacco abuse, COPD, IDDM, HTN, NICM.  Presented to [facility] with 2 weeks of worsening SOB. Echo with newly decreased EF to 50-55%.   Assessment/Plan   1. Acute systolic CHF - History of NICM. Echo in 2013 with normal EF - Echo 05/28/17 LVEF 25-30%, Trivial MR, Mild TR, PA peak pressure 40 mm Hg, elevated CVP. - R/LHC 05/29/17 with well compensated hemodynamics and minimal, non-obstructive CAD.  - Volume status difficult with body habitus. But stable on RHC yesterday.  - Will start coreg 3.125 mg BID.  - Switch losartan to Entresto 49/51 mg BID.  - Continue spiro 12.5 mg daily.  - Continue digoxin 0.125 mg daily.   2. Acute hypoxic respiratory failure - Requiring 3L O2 by [place] - Hope this will improved with continued diuresis.  - Needs outpatient sleep study.   3. AECOPD - Continue steroids and nebs.  - No ABX at this time with no productive cough/sputum. - No change to current plan.   4. Tobacco Abuse - 1 ppd history PTA.  - Encouraged complete cessation.   5. IDDM - HgB A1c 8.6 05/28/17 - Per primary.   6. HTN - Meds as above.   OK for home. Follow up made in HF clinic.   HF meds for home - Coreg 3.125 mg BID - Entresto 49/51 mg BID - Spironolactone 12.5 mg qhs - Digoxin 0.125 mg daily  Medication concerns reviewed with patient and pharmacy team. Barriers identified: None at this time.   Length of Stay: 3  [name]  05/30/2017, 8:19 AM  Advanced Heart Failure Team Pager [phone] (M-F; 7a - 4p)  Please contact [facility] for night-coverage after hours (4p -7a ) and weekends on [email or web address]  Patient seen and examined with the above-signed Advanced Practice Provider and/or Housestaff. I  personally reviewed laboratory data, imaging studies and relevant notes. I independently examined the patient and formulated the important aspects of the plan. I have edited the note to reflect any of my changes or salient points. I have personally discussed the plan with the patient and/or family.  Cath findings reviewed with her. She has severe NICM. Agree with HF meds as above. Ok for d/c today. Will follow clsoely in HF Clinic. Needs sleep study.   Arvilla Meres, MD  6:08 PM

## 2017-05-30 NOTE — Discharge Summary (Signed)
Physician Discharge Summary  INESS PANGILINAN ZOX:[id number] DOB: [birth year] DOA: 05/27/2017  PCP: Patient, No Pcp Per  Admit date: 05/27/2017 Discharge date: 05/30/2017  Admitted From: Home Disposition:  Home  Discharge Condition:Stable CODE STATUS:FULL Diet recommendation: Heart Healthy  Brief/Interim Summary:  Patient is a 57 year old female with past medical history of obesity, tobacco abuse, COPD, insulin-dependent diabetes meds, hypertension, nonischemic cardiomyopathy presents to the emergency department with 2-week history of shortness of breath and wheezing.  Patient found to have wheezes and crackles on examination.  Patient admitted for further management of COPD and CHF exacerbation.  Echocardiogram showed severe reduction in ejection fraction in the range of 20 to 25%.  Cardiology consulted.  Patient underwent  cardiac cath on 05/29/17 with finding of nonobstructive coronary disease. Patient has been medically optimized.  She will follow-up with cardiology as an outpatient.  Following problems were addressed during her hospitalization:   Acute respiratory failure with hypoxia: Most likely secondary to COPD/CHF exacerbation, possible pulmonary edema.  Currently her respiratory status is  stable.    She is not wheezing anymore.Steroids discontinued.  History of nonischemic cardiomyopathy/severe systolic heart failure; Currently euvolemic.  Does not have any peripheral edema.  Lungs are clear.  She was given few doses of Lasix. Echo showed EF of 25-30%.  Mild left ventricular hypertrophy.  Severely reduced systolic function.  Diffuse hypokinesis. Her echocardiogram on 06/04/2011 had shown ejection fraction of 50 to 55% Cardiology consulted and she underwent cardiac catheterization with findings of nonobstructive coronary artery disease. Also started on losartan, Spironolactone, digoxin and carvedilol.  COPD exacerbation: Much improved .  Patient does not have any  wheezes today on  examination.  She did not have a PCP currently.  She needs to follow-up as an outpatient. We will try to arrange DME nebulizer machine on discharge.  She needs to follow-up with pulmonology outpatient for possible pulmonary function test and also for possible underlying obstructive sleep apnea.  Smoker: Continue nicotine patch here. Encouraged tobacco cessation.  Insulin-dependent diabetes mellitus: Hemoglobin A1c of 8.6.  Patient is on insulin 70/30 at home and takes 35 units twice a day.  She says she has been compliant with her insulin administration.  We recommended to increase the insulin to 45 units twice a day. LDL of 103. Diabetic coordinator following was following.  Hypertension: BP stable this morning.    Discharge Diagnoses:  Principal Problem:   Acute respiratory failure with hypoxia (HCC) Active Problems:   Nonischemic cardiomyopathy (HCC)   Hypertension   Uncontrolled diabetes mellitus (HCC)   COPD with acute exacerbation (HCC)   Obesity   Tobacco use   Acute systolic heart failure [facility])    Discharge Instructions  Discharge Instructions    Diet - low sodium heart healthy   Complete by:  As directed    Discharge instructions   Complete by:  As directed    1) Follow up at [facility] in the given appointment date. 2)Please take prescribed medication as instructed. 3) Please restrict fluid intake to less than 1200 ml a day and salt intake to less than 2 gm a day. 4)Follow up with cardiology as an outpatient. Appointment date has been given.  5) Follow up with pulmonology as an outpatient.Do a sleep study as an outpatient for possible obstructive sleep apnea.   Increase activity slowly   Complete by:  As directed      Allergies as of 05/30/2017      Reactions  Banana Itching   Latex Itching      Medication List    STOP taking these medications   potassium chloride SA 20 MEQ tablet Commonly known as:   K-DUR,KLOR-CON     TAKE these medications   acetaminophen 500 MG tablet Commonly known as:  TYLENOL Take 1,500 mg by mouth daily as needed for mild pain or headache.   aspirin 81 MG chewable tablet Chew 81 mg by mouth daily.   bismuth subsalicylate 262 MG/15ML suspension Commonly known as:  PEPTO BISMOL Take 30 mLs by mouth as needed for indigestion.   carvedilol 3.125 MG tablet Commonly known as:  COREG Take 1 tablet (3.125 mg total) by mouth 2 (two) times daily with a meal.   digoxin 0.125 MG tablet Commonly known as:  LANOXIN Take 1 tablet (0.125 mg total) by mouth daily. Start taking on:  05/31/2017   diphenhydrAMINE 25 MG tablet Commonly known as:  BENADRYL Take 25 mg by mouth as needed for allergies.   ibuprofen 200 MG tablet Commonly known as:  ADVIL,MOTRIN Take 800 mg by mouth as needed for moderate pain.   insulin NPH-regular Human (70-30) 100 UNIT/ML injection Commonly known as:  NOVOLIN 70/30 Inject 35 Units into the skin 2 (two) times daily.   multivitamin with minerals Tabs tablet Take 1 tablet by mouth daily.   sacubitril-valsartan 49-51 MG Commonly known as:  ENTRESTO Take 1 tablet by mouth 2 (two) times daily.   spironolactone 25 MG tablet Commonly known as:  ALDACTONE Take 0.5 tablets (12.5 mg total) by mouth daily. Start taking on:  05/31/2017   TYLENOL PM EXTRA STRENGTH 25-500 MG Tabs tablet Generic drug:  diphenhydramine-acetaminophen Take 2 tablets by mouth at bedtime as needed.            Durable Medical Equipment  (From admission, onward)        Start     Ordered   05/28/17 1140  For home use only DME Nebulizer/meds  Once    Question:  Patient needs a nebulizer to treat with the following condition  Answer:  COPD (chronic obstructive pulmonary disease) (HCC)   05/28/17 1139     Follow-up Information    [facility] Follow up.   Why:  call on Monday 5/13 at 8:30 am to make you a follow up apt  ,  after you dial the phone number , hit option 1. Contact information: [address] [place] [address] [phone]       [facility] Follow up on 06/11/2017.   Specialty:  Cardiology Why:  at 1000 am for post hospital follow up. Psychologist, [employer] thru [employer] off of [address], underground parking on your right. The code for parking is 1200. Can also park in lower ED lot and enter through blue awning. Contact information: [address] [id number] mc [place] [address] [phone]         Allergies  Allergen Reactions  . Banana Itching  . Latex Itching    Consultations:  Cardiology   Procedures/Studies: Dg Chest 2 View  Result Date: 05/27/2017 CLINICAL DATA:  Two weeks of shortness of breath. Onset of mid chest pain last night radiating to the back with pleuritic component. History of COPD, cardiomyopathy, diabetes, current smoker. EXAM: CHEST - 2 VIEW COMPARISON:  PA and lateral chest x-ray of February 15, 2016 FINDINGS: The lungs are well-expanded. The interstitial markings are increased. The cardiac silhouette is enlarged.  The pulmonary vascularity is engorged. There is no pleural effusion. The bony thorax exhibits no acute abnormality. IMPRESSION: CHF superimposed upon COPD.  No alveolar pneumonia. Electronically Signed   By: David  [place] M.D.   On: 05/27/2017 09:27   Ct Angio Chest Pe W And/or Wo Contrast  Result Date: 05/27/2017 CLINICAL DATA:  Centralized chest pain.  Shortness of breath. EXAM: CT ANGIOGRAPHY CHEST WITH CONTRAST TECHNIQUE: Multidetector CT imaging of the chest was performed using the standard protocol during bolus administration of intravenous contrast. Multiplanar CT image reconstructions and MIPs were obtained to evaluate the vascular anatomy. CONTRAST:  ISOVUE-370 IOPAMIDOL (ISOVUE-370) INJECTION 76% COMPARISON:  Chest CT Jun 18, 2010.  Chest x-ray May 27, 2017.  FINDINGS: Cardiovascular: Cardiomegaly. Coronary artery calcifications. The thoracic aorta is nonaneurysmal with no dissection. No significant atherosclerotic change. The central pulmonary arteries are normal in caliber. No pulmonary emboli. Mediastinum/Nodes: The thyroid and esophagus are normal. No adenopathy. A few shotty nodes in the retrocrural space on axial image 66 of in stable since 2012, of no acute significance. No effusions. No chest wall abnormalities identified. Lungs/Pleura: The central airways are normal. No pneumothorax. Linear opacity in the lateral right lung such as on series 7, image 47 and coronal image 64 is likely atelectasis given its appearance. Bibasilar atelectasis identified. Mild edema. No infiltrate to suggest pneumonia. There is a nodule in the left base on series 7, image 51 measuring 6 mm today, unchanged since 2012, requiring no follow-up. A nodule in the right base on series 7, image 39 is stable as well. No new nodules or masses. Upper Abdomen: No acute abnormality. Musculoskeletal: No chest wall abnormality. No acute or significant osseous findings. Review of the MIP images confirms the above findings. IMPRESSION: 1. Cardiomegaly, scattered atelectasis, and mild edema. No convincing evidence of pneumonia. 2. No pulmonary emboli identified. 3. No other acute abnormalities. Electronically Signed   By: Gerome Sam III M.D   On: 05/27/2017 16:48       Subjective: Patient seen and examined this morning.  Remains comfortable.  No new issues/events.  Stable for discharge home.  Discharge Exam: Vitals:   05/30/17 0027 05/30/17 0803  BP: 121/70 123/87  Pulse: 91 83  Resp: 20 20  Temp: (!) 97.4 F (36.3 C) 97.8 F (36.6 C)  SpO2: 92% 99%   Vitals:   05/29/17 1835 05/30/17 0027 05/30/17 0500 05/30/17 0803  BP: (!) 155/76 121/70  123/87  Pulse: 87 91  83  Resp: (!) 24 20  20   Temp:  (!) 97.4 F (36.3 C)  97.8 F (36.6 C)  TempSrc:  Oral  Oral  SpO2: 93% 92%   99%  Weight:   92.1 kg (203 lb 0.7 oz)   Height:        General: Pt is alert, awake, not in acute distress Cardiovascular: RRR, S1/S2 +, no rubs, no gallops Respiratory: CTA bilaterally, no wheezing, no rhonchi Abdominal: Soft, NT, ND, bowel sounds + Extremities: no edema, no cyanosis    The results of significant diagnostics from this hospitalization (including imaging, microbiology, ancillary and laboratory) are listed below for reference.     Microbiology: Recent Results (from the past 240 hour(s))  MRSA PCR Screening     Status: None   Collection Time: 05/28/17 11:41 AM  Result Value Ref Range Status   MRSA by PCR NEGATIVE NEGATIVE Final    Comment:        The GeneXpert MRSA Assay (FDA approved for NASAL specimens  only), is one component of a comprehensive MRSA colonization surveillance program. It is not intended to diagnose MRSA infection nor to guide or monitor treatment for MRSA infections. Performed at [facility], [address], [place], [place] [address]      Labs: BNP (last 3 results) Recent Labs    05/27/17 1333  BNP 271.6*   Basic Metabolic Panel: Recent Labs  Lab 05/27/17 0918 05/28/17 0230 05/29/17 0653 05/30/17 0929  NA 139 136 143 140  K 3.6 3.7 3.8 4.1  CL 102 96* 101 100*  CO2 27 29 32 31  GLUCOSE 276* 438* 76 171*  BUN 11 16 32* 28*  CREATININE 0.73 0.96 0.80 1.02*  CALCIUM 8.8* 9.2 9.0 8.7*  MG  --   --  2.2  --    Liver Function Tests: No results for input(s): AST, ALT, ALKPHOS, BILITOT, PROT, ALBUMIN in the last 168 hours. No results for input(s): LIPASE, AMYLASE in the last 168 hours. No results for input(s): AMMONIA in the last 168 hours. CBC: Recent Labs  Lab 05/27/17 0918  WBC 11.3*  HGB 13.5  HCT 42.2  MCV 91.9  PLT 253   Cardiac Enzymes: No results for input(s): CKTOTAL, CKMB, CKMBINDEX, TROPONINI in the last 168 hours. BNP: Invalid input(s): POCBNP CBG: Recent Labs  Lab 05/29/17 1210 05/29/17 1721  05/29/17 1857 05/29/17 2038 05/30/17 0752  GLUCAP 86 90 125* 270* 193*   D-Dimer Recent Labs    05/27/17 1333  DDIMER 0.93*   Hgb A1c Recent Labs    05/28/17 0230  HGBA1C 8.6*   Lipid Profile Recent Labs    05/28/17 0230  CHOL 169  HDL 53  LDLCALC 103*  TRIG 64  CHOLHDL 3.2   Thyroid function studies No results for input(s): TSH, T4TOTAL, T3FREE, THYROIDAB in the last 72 hours.  Invalid input(s): FREET3 Anemia work up No results for input(s): VITAMINB12, FOLATE, FERRITIN, TIBC, IRON, RETICCTPCT in the last 72 hours. Urinalysis    Component Value Date/Time   COLORURINE YELLOW 08/17/2015 1615   APPEARANCEUR HAZY (A) 08/17/2015 1615   LABSPEC 1.010 08/17/2015 1615   PHURINE 5.0 08/17/2015 1615   GLUCOSEU >1000 (A) 08/17/2015 1615   HGBUR MODERATE (A) 08/17/2015 1615   BILIRUBINUR NEGATIVE 08/17/2015 1615   KETONESUR TRACE (A) 08/17/2015 1615   PROTEINUR NEGATIVE 08/17/2015 1615   UROBILINOGEN 0.2 07/06/2014 1342   NITRITE NEGATIVE 08/17/2015 1615   LEUKOCYTESUR SMALL (A) 08/17/2015 1615   Sepsis Labs Invalid input(s): PROCALCITONIN,  WBC,  LACTICIDVEN Microbiology Recent Results (from the past 240 hour(s))  MRSA PCR Screening     Status: None   Collection Time: 05/28/17 11:41 AM  Result Value Ref Range Status   MRSA by PCR NEGATIVE NEGATIVE Final    Comment:        The GeneXpert MRSA Assay (FDA approved for NASAL specimens only), is one component of a comprehensive MRSA colonization surveillance program. It is not intended to diagnose MRSA infection nor to guide or monitor treatment for MRSA infections. Performed at [facility], [address], [place], [place] [address]      Time coordinating discharge: 35 minutes  SIGNED:   Burnadette Pop, MD  [facility] 05/30/2017, 11:26 AM Pager [phone]  If 7PM-7AM, please contact night-coverage [email or web address] Password [email or web address]

## 2017-05-30 NOTE — Plan of Care (Signed)

## 2017-06-03 MED FILL — Heparin Sod (Porcine)-NaCl IV Soln 1000 Unit/500ML-0.9%: INTRAVENOUS | Qty: 1000 | Status: AC

## 2017-06-10 NOTE — Progress Notes (Signed)
Advanced Heart Failure Clinic Note   PCP: Patient, No Pcp Per PCP-Cardiologist: Dr Gala Romney  HPI: Felicia [name] is a 57 y.o. female with h/o of obesity, tobacco abuse, COPD, IDDM, HTN, NICM.  Admitted 5/7-5/10/19 with SOB and wheezing. Echo showed newly reduced EF 20-25% (previously normal 2013, but reportedly low in 2012). HF team was consulted. Underwent R/LHC that showed nonobstructive CAD and well compensated hemodynamics. She was also treated for COPD exacerbation with steroids and nebulizers. Diuresed with IV lasix. DC weight: 203 lbs.  Today she presents for post hospital follow up. Overall doing really well. She is only SOB with stairs. She snores, but has not had a sleep study. She has a dry cough, but no fevers. She is fatigued. Appetite okay. Denies orthopnea, PND, or edema. Denies CP or dizziness. Compliant with medications. She is limiting fluid and salt intake.  SH: Lives at home with daughters, manages her own medications, daughters drive her to appointments, able to get all medications (has Entresto coupon card). She smokes <5 cigarettes/day. No ETOH or drugs.   [facility] 05/29/17  Prox LAD lesion is 20% stenosed.  Mid Cx lesion is 40% stenosed. Ao = 132/80 (100) LV = 127/19 RA = 7 RV = 38/8 PA = 39/12 (26) PCW = 14 Fick cardiac output/index = 5.3/2.9 PVR = 2.3 WU SVR = 1408 Ao sat = 93% PA sat = 66%, 68%  Assessment: 1. Co-dominant coronary system with separate ostial for LAD and LCx 2. Minimal non-obstructive CAD 3. NICM EF 25-30%  4. Well-compensated hemodynamics  Echo5/8/19LVEF 25-30%, Trivial MR, Mild TR, PA peak pressure 40 mm Hg  Review of systems complete and found to be negative unless listed in HPI.   Past Medical History:  Diagnosis Date  . Cardiomyopathy   . CHF (congestive heart failure) (HCC)    minamal  . COPD (chronic obstructive pulmonary disease) (HCC)   . DDD (degenerative disc disease), lumbar   . Diabetes mellitus   .  Hypertension   . Non compliance w medication regimen   . Normal echocardiogram    LVEF 25-30% 03/27/2010    Current Outpatient Medications  Medication Sig Dispense Refill  . acetaminophen (TYLENOL) 500 MG tablet Take 1,500 mg by mouth daily as needed for mild pain or headache.    . albuterol (PROVENTIL HFA;VENTOLIN HFA) 108 (90 Base) MCG/ACT inhaler Inhale 2 puffs into the lungs every 6 (six) hours as needed for wheezing or shortness of breath. 1 Inhaler 0  . aspirin 81 MG chewable tablet Chew 81 mg by mouth daily.    Marland Kitchen bismuth subsalicylate (PEPTO BISMOL) 262 MG/15ML suspension Take 30 mLs by mouth as needed for indigestion.    . carvedilol (COREG) 3.125 MG tablet Take 1 tablet (3.125 mg total) by mouth 2 (two) times daily with a meal. 60 tablet 0  . digoxin (LANOXIN) 0.125 MG tablet Take 1 tablet (0.125 mg total) by mouth daily. 30 tablet 0  . diphenhydrAMINE (BENADRYL) 25 MG tablet Take 25 mg by mouth as needed for allergies.    . diphenhydramine-acetaminophen (TYLENOL PM EXTRA STRENGTH) 25-500 MG TABS tablet Take 2 tablets by mouth at bedtime as needed.    Marland Kitchen ibuprofen (ADVIL,MOTRIN) 200 MG tablet Take 800 mg by mouth as needed for moderate pain.    Marland Kitchen insulin NPH-regular Human (NOVOLIN 70/30) (70-30) 100 UNIT/ML injection Inject 40 Units into the skin 2 (two) times daily. 24 mL 0  . ipratropium-albuterol (DUONEB) 0.5-2.5 (3) MG/3ML SOLN Take 3 mLs by  nebulization every 6 (six) hours as needed. 360 mL 0  . Multiple Vitamin (MULTIVITAMIN WITH MINERALS) TABS tablet Take 1 tablet by mouth daily.    . sacubitril-valsartan (ENTRESTO) 49-51 MG Take 1 tablet by mouth 2 (two) times daily. 60 tablet 0  . spironolactone (ALDACTONE) 25 MG tablet Take 0.5 tablets (12.5 mg total) by mouth daily. 30 tablet 0   No current facility-administered medications for this encounter.     Allergies  Allergen Reactions  . Banana Itching  . Latex Itching      Social History   Socioeconomic History  .  Marital status: Single    Spouse name: Not on file  . Number of children: 2  . Years of education: Not on file  . Highest education level: Not on file  Occupational History  . Not on file  Social Needs  . Financial resource strain: Not on file  . Food insecurity:    Worry: Not on file    Inability: Not on file  . Transportation needs:    Medical: Not on file    Non-medical: Not on file  Tobacco Use  . Smoking status: Current Every Day Smoker    Packs/day: 0.50    Types: Cigarettes  . Smokeless tobacco: Never Used  Substance and Sexual Activity  . Alcohol use: No  . Drug use: No  . Sexual activity: Yes    Birth control/protection: Surgical, Post-menopausal  Lifestyle  . Physical activity:    Days per week: Not on file    Minutes per session: Not on file  . Stress: Not on file  Relationships  . Social connections:    Talks on phone: Not on file    Gets together: Not on file    Attends religious service: Not on file    Active member of club or organization: Not on file    Attends meetings of clubs or organizations: Not on file    Relationship status: Not on file  . Intimate partner violence:    Fear of current or ex partner: Not on file    Emotionally abused: Not on file    Physically abused: Not on file    Forced sexual activity: Not on file  Other Topics Concern  . Not on file  Social History Narrative  . Not on file      Family History  Problem Relation Age of Onset  . Stroke Mother   . Lung cancer Father     Vitals:   06/11/17 0959  BP: 118/70  Pulse: 90  SpO2: 99%  Weight: 198 lb 4 oz (89.9 kg)   Wt Readings from Last 3 Encounters:  06/11/17 198 lb 4 oz (89.9 kg)  05/30/17 203 lb 0.7 oz (92.1 kg)  08/17/15 155 lb 9 oz (70.6 kg)    PHYSICAL EXAM: General:  Obese. No respiratory difficulty HEENT: normal Neck: supple. no JVD. Carotids 2+ bilat; no bruits. No lymphadenopathy or thyromegaly appreciated. Cor: PMI laterally displaced. Regular rate &  rhythm. No rubs, gallops or murmurs. Lungs: clear Abdomen: soft, nontender, nondistended. No hepatosplenomegaly. No bruits or masses. Good bowel sounds. Extremities: no cyanosis, clubbing, rash, edema Neuro: alert & oriented x 3, cranial nerves grossly intact. moves all 4 extremities w/o difficulty. Affect pleasant.  ASSESSMENT & PLAN:  1. New systolic CHF - History of NICM. Echo in 2013 with normal EF -Echo5/8/19LVEF 25-30%, Trivial MR, Mild TR, PA peak pressure 40 mm Hg, elevated CVP. - R/LHC 05/29/17 with well compensated hemodynamics  and minimal, non-obstructive CAD.  - NYHA II - Volume statusdifficult with body habitus, but appears stable - Continue coreg 3.125 mg BID. Will not increase today with fatigue. - Continue Entresto 49/51 mg BID.  -Increase spiro 25 mg daily. BMET today and in 7-10 days.  - Continue digoxin 0.125 mg daily. Check dig level today - Repeat echo in 3-4 months. We discussed the possibility of ICD if EF remains <35%. Not a candidate for CRT with narrow QRS - Refer to cardiac rehab  2. COPD - Encouraged her to quit smoking  3. Tobacco Abuse - She is smoking <5 cigarettes/day. - Encouraged complete cessation.   4. IDDM - HgB A1c 8.6 05/28/17  5. HTN - Stable today  6. Snores - Refer for sleep study  Refer for sleep study Refer for cardiac rehab Increase spiro to 25 mg daily. BMET in 7-10 days.  Dig level, BMET today.  Echo 3-4 months and follow up with Dr Gala Romney Follow up in 2 weeks  Alford Highland, NP 06/11/17  Greater than 50% of the 25 minute visit was spent in counseling/coordination of care regarding disease state education, salt/fluid restriction, sliding scale diuretics, and medication compliance.

## 2017-06-11 ENCOUNTER — Telehealth (HOSPITAL_COMMUNITY): Payer: Self-pay | Admitting: *Deleted

## 2017-06-11 ENCOUNTER — Other Ambulatory Visit: Payer: Self-pay

## 2017-06-11 ENCOUNTER — Encounter (HOSPITAL_COMMUNITY): Payer: Self-pay

## 2017-06-11 ENCOUNTER — Ambulatory Visit (HOSPITAL_COMMUNITY)
Admission: RE | Admit: 2017-06-11 | Discharge: 2017-06-11 | Disposition: A | Discharge status: Home / Self Care | Payer: Self-pay | Source: Ambulatory Visit | Attending: Internal Medicine | Admitting: Internal Medicine

## 2017-06-11 VITALS — BP 118/70 | HR 90 | Wt 198.2 lb

## 2017-06-11 DIAGNOSIS — I110 Hypertensive heart disease with heart failure: Secondary | ICD-10-CM | POA: Insufficient documentation

## 2017-06-11 DIAGNOSIS — R0683 Snoring: Secondary | ICD-10-CM | POA: Insufficient documentation

## 2017-06-11 DIAGNOSIS — I428 Other cardiomyopathies: Secondary | ICD-10-CM | POA: Insufficient documentation

## 2017-06-11 DIAGNOSIS — Z720 Tobacco use: Secondary | ICD-10-CM

## 2017-06-11 DIAGNOSIS — I2510 Atherosclerotic heart disease of native coronary artery without angina pectoris: Secondary | ICD-10-CM | POA: Insufficient documentation

## 2017-06-11 DIAGNOSIS — F17210 Nicotine dependence, cigarettes, uncomplicated: Secondary | ICD-10-CM | POA: Insufficient documentation

## 2017-06-11 DIAGNOSIS — Z7982 Long term (current) use of aspirin: Secondary | ICD-10-CM | POA: Insufficient documentation

## 2017-06-11 DIAGNOSIS — E669 Obesity, unspecified: Secondary | ICD-10-CM | POA: Insufficient documentation

## 2017-06-11 DIAGNOSIS — I5022 Chronic systolic (congestive) heart failure: Secondary | ICD-10-CM

## 2017-06-11 DIAGNOSIS — I5020 Unspecified systolic (congestive) heart failure: Secondary | ICD-10-CM | POA: Insufficient documentation

## 2017-06-11 DIAGNOSIS — Z79899 Other long term (current) drug therapy: Secondary | ICD-10-CM | POA: Insufficient documentation

## 2017-06-11 DIAGNOSIS — Z794 Long term (current) use of insulin: Secondary | ICD-10-CM | POA: Insufficient documentation

## 2017-06-11 DIAGNOSIS — I10 Essential (primary) hypertension: Secondary | ICD-10-CM

## 2017-06-11 DIAGNOSIS — J449 Chronic obstructive pulmonary disease, unspecified: Secondary | ICD-10-CM | POA: Insufficient documentation

## 2017-06-11 DIAGNOSIS — E119 Type 2 diabetes mellitus without complications: Secondary | ICD-10-CM | POA: Insufficient documentation

## 2017-06-11 LAB — BASIC METABOLIC PANEL
Anion gap: 11 (ref 5–15)
BUN: 18 mg/dL (ref 6–20)
CALCIUM: 9.1 mg/dL (ref 8.9–10.3)
CO2: 26 mmol/L (ref 22–32)
CREATININE: 0.83 mg/dL (ref 0.44–1.00)
Chloride: 103 mmol/L (ref 101–111)
GFR calc non Af Amer: >60 mL/min (ref >60)
Glucose, Bld: 162 mg/dL — ABNORMAL HIGH (ref 65–99)
Potassium: 4.2 mmol/L (ref 3.5–5.1)
SODIUM: 140 mmol/L (ref 135–145)

## 2017-06-11 LAB — DIGOXIN LEVEL: DIGOXIN LVL: 1 ng/mL (ref 0.8–2.0)

## 2017-06-11 MED ORDER — SPIRONOLACTONE 25 MG PO TABS: 25.0000 mg | ORAL_TABLET | Freq: Every day | ORAL | 3 refills | Status: DC

## 2017-06-11 NOTE — Telephone Encounter (Signed)
Result Notes for Digoxin level   Notes recorded by Georgina Peer, RN on 06/11/2017 at 12:38 PM EDT Patient is already scheduled for June 6th, per Duwaine Maxin, NP we can check dig trough at that appointment. Called and spoke with patient, she is aware to hold her digoxin that morning before her appointment. No further questions. ------  Notes recorded by Alford Highland, NP on 06/11/2017 at 12:12 PM EDT Her digoxin level is a little elevated. It may just be because she took it this morning. Please have her come back next week for digoxin level. Have her HOLD her digoxin that morning so we can get an accurate trough reading.

## 2017-06-11 NOTE — Patient Instructions (Signed)
Labs today (will call for abnormal results, otherwise no news is good news)  Referral placed for sleep study, we'll contact you to schedule appointment.  Referral placed for Cardiac Rehab, their office will contact you to schedule initial appointment.  INCREASE Spironolactone to 25 mg Daily  Follow up in 2 weeks and labs (bmet)  Echocardiogram and Follow up with Dr. Gala Romney in 3-4 months.

## 2017-06-13 ENCOUNTER — Inpatient Hospital Stay: Payer: Self-pay

## 2017-06-26 ENCOUNTER — Encounter (HOSPITAL_COMMUNITY): Payer: Self-pay

## 2017-07-08 ENCOUNTER — Telehealth (HOSPITAL_COMMUNITY): Payer: Self-pay

## 2017-07-08 NOTE — Telephone Encounter (Signed)
Attempted to call patient in regards to Cardiac Rehab and insurance - lm on vm °

## 2017-07-09 ENCOUNTER — Ambulatory Visit (HOSPITAL_COMMUNITY)
Admission: RE | Admit: 2017-07-09 | Discharge: 2017-07-09 | Disposition: A | Discharge status: Home / Self Care | Payer: Self-pay | Source: Ambulatory Visit | Attending: Internal Medicine | Admitting: Internal Medicine

## 2017-07-09 ENCOUNTER — Encounter (HOSPITAL_COMMUNITY): Payer: Self-pay

## 2017-07-09 ENCOUNTER — Other Ambulatory Visit: Payer: Self-pay

## 2017-07-09 VITALS — BP 162/94 | HR 85 | Wt 199.4 lb

## 2017-07-09 DIAGNOSIS — Z720 Tobacco use: Secondary | ICD-10-CM

## 2017-07-09 DIAGNOSIS — Z79899 Other long term (current) drug therapy: Secondary | ICD-10-CM | POA: Insufficient documentation

## 2017-07-09 DIAGNOSIS — I428 Other cardiomyopathies: Secondary | ICD-10-CM

## 2017-07-09 DIAGNOSIS — I5022 Chronic systolic (congestive) heart failure: Secondary | ICD-10-CM

## 2017-07-09 DIAGNOSIS — Z791 Long term (current) use of non-steroidal anti-inflammatories (NSAID): Secondary | ICD-10-CM | POA: Insufficient documentation

## 2017-07-09 DIAGNOSIS — I2510 Atherosclerotic heart disease of native coronary artery without angina pectoris: Secondary | ICD-10-CM | POA: Insufficient documentation

## 2017-07-09 DIAGNOSIS — Z794 Long term (current) use of insulin: Secondary | ICD-10-CM | POA: Insufficient documentation

## 2017-07-09 DIAGNOSIS — Z801 Family history of malignant neoplasm of trachea, bronchus and lung: Secondary | ICD-10-CM | POA: Insufficient documentation

## 2017-07-09 DIAGNOSIS — I110 Hypertensive heart disease with heart failure: Secondary | ICD-10-CM | POA: Insufficient documentation

## 2017-07-09 DIAGNOSIS — R0683 Snoring: Secondary | ICD-10-CM

## 2017-07-09 DIAGNOSIS — M5136 Other intervertebral disc degeneration, lumbar region: Secondary | ICD-10-CM | POA: Insufficient documentation

## 2017-07-09 DIAGNOSIS — E119 Type 2 diabetes mellitus without complications: Secondary | ICD-10-CM | POA: Insufficient documentation

## 2017-07-09 DIAGNOSIS — Z823 Family history of stroke: Secondary | ICD-10-CM | POA: Insufficient documentation

## 2017-07-09 DIAGNOSIS — Z91018 Allergy to other foods: Secondary | ICD-10-CM | POA: Insufficient documentation

## 2017-07-09 DIAGNOSIS — I10 Essential (primary) hypertension: Secondary | ICD-10-CM

## 2017-07-09 DIAGNOSIS — Z91040 Latex allergy status: Secondary | ICD-10-CM | POA: Insufficient documentation

## 2017-07-09 DIAGNOSIS — J441 Chronic obstructive pulmonary disease with (acute) exacerbation: Secondary | ICD-10-CM | POA: Insufficient documentation

## 2017-07-09 DIAGNOSIS — F17210 Nicotine dependence, cigarettes, uncomplicated: Secondary | ICD-10-CM | POA: Insufficient documentation

## 2017-07-09 DIAGNOSIS — E669 Obesity, unspecified: Secondary | ICD-10-CM | POA: Insufficient documentation

## 2017-07-09 DIAGNOSIS — I429 Cardiomyopathy, unspecified: Secondary | ICD-10-CM | POA: Insufficient documentation

## 2017-07-09 DIAGNOSIS — Z9114 Patient's other noncompliance with medication regimen: Secondary | ICD-10-CM | POA: Insufficient documentation

## 2017-07-09 DIAGNOSIS — Z7982 Long term (current) use of aspirin: Secondary | ICD-10-CM | POA: Insufficient documentation

## 2017-07-09 MED ORDER — SPIRONOLACTONE 25 MG PO TABS: 25.0000 mg | ORAL_TABLET | Freq: Every day | ORAL | 3 refills | Status: DC

## 2017-07-09 MED ORDER — CARVEDILOL 3.125 MG PO TABS: 3.1250 mg | ORAL_TABLET | Freq: Two times a day (BID) | ORAL | 3 refills | Status: DC

## 2017-07-09 MED ORDER — DIGOXIN 125 MCG PO TABS: 0.1250 mg | ORAL_TABLET | Freq: Every day | ORAL | 3 refills | Status: DC

## 2017-07-09 NOTE — Progress Notes (Signed)
Advanced Heart Failure Clinic Note   PCP: Patient, No Pcp Per PCP-Cardiologist: Dr Gala Romney  HPI: Felicia [name] is a 57 y.o. female with h/o of obesity, tobacco abuse, COPD, IDDM, HTN, NICM.  Admitted 5/7-5/10/19 with SOB and wheezing. Echo showed newly reduced EF 20-25% (previously normal 2013, but reportedly low in 2012). HF team was consulted. Underwent R/LHC that showed nonobstructive CAD and well compensated hemodynamics. She was also treated for COPD exacerbation with steroids and nebulizers. Diuresed with IV lasix. DC weight: 203 lbs.  Today she returns for HF follow up. Overall feeling fine. SOB with steps. Denies PND/Orthopnea. Appetite ok. No fever or chills. She has not been weighing at home because she doesn't have a scale. Ran out medications a few days ago. Cant afford entresto. Smoking 5 cigarettes per day.   SH: Lives at home with daughters, manages her own medications, daughters drive her to appointments.  No ETOH or drugs.   [facility] 05/29/17  Prox LAD lesion is 20% stenosed.  Mid Cx lesion is 40% stenosed. Ao = 132/80 (100) LV = 127/19 RA = 7 RV = 38/8 PA = 39/12 (26) PCW = 14 Fick cardiac output/index = 5.3/2.9 PVR = 2.3 WU SVR = 1408 Ao sat = 93% PA sat = 66%, 68%  Assessment: 1. Co-dominant coronary system with separate ostial for LAD and LCx 2. Minimal non-obstructive CAD 3. NICM EF 25-30%  4. Well-compensated hemodynamics  Echo5/8/19LVEF 25-30%, Trivial MR, Mild TR, PA peak pressure 40 mm Hg  Review of systems complete and found to be negative unless listed in HPI.   Past Medical History:  Diagnosis Date  . Cardiomyopathy   . CHF (congestive heart failure) (HCC)    minamal  . COPD (chronic obstructive pulmonary disease) (HCC)   . DDD (degenerative disc disease), lumbar   . Diabetes mellitus   . Hypertension   . Non compliance w medication regimen   . Normal echocardiogram    LVEF 25-30% 03/27/2010    Current Outpatient Medications    Medication Sig Dispense Refill  . acetaminophen (TYLENOL) 500 MG tablet Take 1,500 mg by mouth daily as needed for mild pain or headache.    . albuterol (PROVENTIL HFA;VENTOLIN HFA) 108 (90 Base) MCG/ACT inhaler Inhale 2 puffs into the lungs every 6 (six) hours as needed for wheezing or shortness of breath. 1 Inhaler 0  . aspirin 81 MG chewable tablet Chew 81 mg by mouth daily.    Marland Kitchen bismuth subsalicylate (PEPTO BISMOL) 262 MG/15ML suspension Take 30 mLs by mouth as needed for indigestion.    . carvedilol (COREG) 3.125 MG tablet Take 1 tablet (3.125 mg total) by mouth 2 (two) times daily with a meal. 60 tablet 0  . digoxin (LANOXIN) 0.125 MG tablet Take 1 tablet (0.125 mg total) by mouth daily. 30 tablet 0  . diphenhydrAMINE (BENADRYL) 25 MG tablet Take 25 mg by mouth as needed for allergies.    . diphenhydramine-acetaminophen (TYLENOL PM EXTRA STRENGTH) 25-500 MG TABS tablet Take 2 tablets by mouth at bedtime as needed.    Marland Kitchen ibuprofen (ADVIL,MOTRIN) 200 MG tablet Take 800 mg by mouth as needed for moderate pain.    Marland Kitchen insulin NPH-regular Human (NOVOLIN 70/30) (70-30) 100 UNIT/ML injection Inject 40 Units into the skin 2 (two) times daily. 24 mL 0  . ipratropium-albuterol (DUONEB) 0.5-2.5 (3) MG/3ML SOLN Take 3 mLs by nebulization every 6 (six) hours as needed. 360 mL 0  . Multiple Vitamin (MULTIVITAMIN WITH MINERALS) TABS tablet Take  1 tablet by mouth daily.    . sacubitril-valsartan (ENTRESTO) 49-51 MG Take 1 tablet by mouth 2 (two) times daily. 60 tablet 0  . spironolactone (ALDACTONE) 25 MG tablet Take 1 tablet (25 mg total) by mouth daily. 30 tablet 3   No current facility-administered medications for this encounter.     Allergies  Allergen Reactions  . Banana Itching  . Latex Itching      Social History   Socioeconomic History  . Marital status: Single    Spouse name: Not on file  . Number of children: 2  . Years of education: Not on file  . Highest education level: Not on file   Occupational History  . Not on file  Social Needs  . Financial resource strain: Not on file  . Food insecurity:    Worry: Not on file    Inability: Not on file  . Transportation needs:    Medical: Not on file    Non-medical: Not on file  Tobacco Use  . Smoking status: Current Every Day Smoker    Packs/day: 0.50    Types: Cigarettes  . Smokeless tobacco: Never Used  Substance and Sexual Activity  . Alcohol use: No  . Drug use: No  . Sexual activity: Yes    Birth control/protection: Surgical, Post-menopausal  Lifestyle  . Physical activity:    Days per week: Not on file    Minutes per session: Not on file  . Stress: Not on file  Relationships  . Social connections:    Talks on phone: Not on file    Gets together: Not on file    Attends religious service: Not on file    Active member of club or organization: Not on file    Attends meetings of clubs or organizations: Not on file    Relationship status: Not on file  . Intimate partner violence:    Fear of current or ex partner: Not on file    Emotionally abused: Not on file    Physically abused: Not on file    Forced sexual activity: Not on file  Other Topics Concern  . Not on file  Social History Narrative  . Not on file      Family History  Problem Relation Age of Onset  . Stroke Mother   . Lung cancer Father     Vitals:   07/09/17 1442  BP: (!) 162/94  Pulse: 85  SpO2: 97%  Weight: 199 lb 6 oz (90.4 kg)   Wt Readings from Last 3 Encounters:  07/09/17 199 lb 6 oz (90.4 kg)  06/11/17 198 lb 4 oz (89.9 kg)  05/30/17 203 lb 0.7 oz (92.1 kg)    PHYSICAL EXAM: General:  Well appearing. No resp difficulty HEENT: normal Neck: supple. no JVD. Carotids 2+ bilat; no bruits. No lymphadenopathy or thryomegaly appreciated. Cor: PMI nondisplaced. Regular rate & rhythm. No rubs, gallops or murmurs. Lungs: clear Abdomen: obese, soft, nontender, nondistended. No hepatosplenomegaly. No bruits or masses. Good bowel  sounds. Extremities: no cyanosis, clubbing, rash, edema Neuro: alert & orientedx3, cranial nerves grossly intact. moves all 4 extremities w/o difficulty. Affect pleasant  ASSESSMENT & PLAN:  1. Chronic systolic CHF - History of NICM. Echo in 2013 with normal EF -Echo5/8/19LVEF 25-30%, Trivial MR, Mild TR, PA peak pressure 40 mm Hg - R/LHC 05/29/17 with well compensated hemodynamics and minimal, non-obstructive CAD.  No medication changes because she has not taken any medications over the last 2 days.  -  NYHA II-III. Volume status stable. - Continue coreg 3.125 mg BID. - Continue Entresto 49/51 mg BID. Unable to pay for entresto. She was given samples today.  -Continue spiro 25 mg daily.  - Continue digoxin 0.125 mg daily.  - Repeat echo in 3-4 months. We discussed the possibility of ICD if EF remains <35%. Not a candidate for CRT with narrow QRS  2. COPD - Encouraged her to quit smoking  3. Tobacco Abuse - Discussed smoking cessation    4. IDDM - HgB A1c 8.6 05/28/17  5. HTN - Elevated but she has not been taking medications today.   6. Snores -Referred for sleep study.   Follow up in 2 weeks with pharmacy and 4 weeks with APP.   Consider HF Paramedicine if she has medication errors at the next visit.   Tonye Becket, NP 07/09/17

## 2017-07-09 NOTE — Patient Instructions (Signed)
Follow up in 2 weeks with pharmacy and 4 weeks with Clinic.

## 2017-07-14 ENCOUNTER — Ambulatory Visit: Payer: Self-pay | Admitting: Internal Medicine

## 2017-07-23 ENCOUNTER — Ambulatory Visit (HOSPITAL_COMMUNITY): Payer: Self-pay

## 2017-08-10 NOTE — Progress Notes (Signed)
Advanced Heart Failure Clinic Note   PCP: Patient, No Pcp Per PCP-Cardiologist: Dr Haroldine Laws  HPI: Felicia [name] is a 57 y.o. female with h/o of obesity, tobacco abuse, COPD, IDDM, HTN, NICM.  Admitted 5/7-5/10/19 with SOB and wheezing. Echo showed newly reduced EF 20-25% (previously normal 2013, but reportedly low in 2012). HF team was consulted. Underwent R/LHC that showed nonobstructive CAD and well compensated hemodynamics. She was also treated for COPD exacerbation with steroids and nebulizers. Diuresed with IV lasix. DC weight: 203 lbs.  Today she returns for HF follow up. Last visit no medication changes were made because she had not taken meds for 2 days. Overall doing better. She is only SOB with steps and inclines. Denies orthopnea, PND, or edema. No CP or dizziness. Appetite and energy level okay. Has not missed any medication doses. Her daughters cook for her. She avoids salt and limits her fluid intake. She does not weigh at home. Continues to smoke 3-4 cigarettes/day.   Review of systems complete and found to be negative unless listed in HPI.   SH: Lives at home with daughters, manages her own medications, daughters drive her to appointments.  No ETOH or drugs.   [facility] 05/29/17  Prox LAD lesion is 20% stenosed.  Mid Cx lesion is 40% stenosed. Ao = 132/80 (100) LV = 127/19 RA = 7 RV = 38/8 PA = 39/12 (26) PCW = 14 Fick cardiac output/index = 5.3/2.9 PVR = 2.3 WU SVR = 1408 Ao sat = 93% PA sat = 66%, 68%  Assessment: 1. Co-dominant coronary system with separate ostial for LAD and LCx 2. Minimal non-obstructive CAD 3. NICM EF 25-30%  4. Well-compensated hemodynamics  Echo5/8/19LVEF 25-30%, Trivial MR, Mild TR, PA peak pressure 40 mm Hg  Past Medical History:  Diagnosis Date  . Cardiomyopathy   . CHF (congestive heart failure) (HCC)    minamal  . COPD (chronic obstructive pulmonary disease) ([place])   . DDD (degenerative disc disease), lumbar   .  Diabetes mellitus   . Hypertension   . Non compliance w medication regimen   . Normal echocardiogram    LVEF 25-30% 03/27/2010    Current Outpatient Medications  Medication Sig Dispense Refill  . acetaminophen (TYLENOL) 500 MG tablet Take 1,500 mg by mouth daily as needed for mild pain or headache.    . albuterol (PROVENTIL HFA;VENTOLIN HFA) 108 (90 Base) MCG/ACT inhaler Inhale 2 puffs into the lungs every 6 (six) hours as needed for wheezing or shortness of breath. 1 Inhaler 0  . aspirin 81 MG chewable tablet Chew 81 mg by mouth daily.    Marland Kitchen bismuth subsalicylate (PEPTO BISMOL) 262 MG/15ML suspension Take 30 mLs by mouth as needed for indigestion.    . carvedilol (COREG) 3.125 MG tablet Take 1 tablet (3.125 mg total) by mouth 2 (two) times daily with a meal. 60 tablet 3  . digoxin (LANOXIN) 0.125 MG tablet Take 1 tablet (0.125 mg total) by mouth daily. 30 tablet 3  . diphenhydrAMINE (BENADRYL) 25 MG tablet Take 25 mg by mouth as needed for allergies.    . diphenhydramine-acetaminophen (TYLENOL PM EXTRA STRENGTH) 25-500 MG TABS tablet Take 2 tablets by mouth at bedtime as needed.    Marland Kitchen ibuprofen (ADVIL,MOTRIN) 200 MG tablet Take 800 mg by mouth as needed for moderate pain.    . Multiple Vitamin (MULTIVITAMIN WITH MINERALS) TABS tablet Take 1 tablet by mouth daily.    . sacubitril-valsartan (ENTRESTO) 49-51 MG Take 1 tablet by mouth  2 (two) times daily. 60 tablet 0  . spironolactone (ALDACTONE) 25 MG tablet Take 1 tablet (25 mg total) by mouth daily. 30 tablet 3  . insulin NPH-regular Human (NOVOLIN 70/30) (70-30) 100 UNIT/ML injection Inject 40 Units into the skin 2 (two) times daily. 24 mL 0  . ipratropium-albuterol (DUONEB) 0.5-2.5 (3) MG/3ML SOLN Take 3 mLs by nebulization every 6 (six) hours as needed. 360 mL 0   No current facility-administered medications for this encounter.     Allergies  Allergen Reactions  . Banana Itching  . Latex Itching      Social History   Socioeconomic  History  . Marital status: Single    Spouse name: Not on file  . Number of children: 2  . Years of education: Not on file  . Highest education level: Not on file  Occupational History  . Not on file  Social Needs  . Financial resource strain: Not on file  . Food insecurity:    Worry: Not on file    Inability: Not on file  . Transportation needs:    Medical: Not on file    Non-medical: Not on file  Tobacco Use  . Smoking status: Current Every Day Smoker    Packs/day: 0.50    Types: Cigarettes  . Smokeless tobacco: Never Used  Substance and Sexual Activity  . Alcohol use: No  . Drug use: No  . Sexual activity: Yes    Birth control/protection: Surgical, Post-menopausal  Lifestyle  . Physical activity:    Days per week: Not on file    Minutes per session: Not on file  . Stress: Not on file  Relationships  . Social connections:    Talks on phone: Not on file    Gets together: Not on file    Attends religious service: Not on file    Active member of club or organization: Not on file    Attends meetings of clubs or organizations: Not on file    Relationship status: Not on file  . Intimate partner violence:    Fear of current or ex partner: Not on file    Emotionally abused: Not on file    Physically abused: Not on file    Forced sexual activity: Not on file  Other Topics Concern  . Not on file  Social History Narrative  . Not on file      Family History  Problem Relation Age of Onset  . Stroke Mother   . Lung cancer Father     Vitals:   08/11/17 1439  BP: (!) 142/88  Pulse: 92  SpO2: 93%  Weight: 199 lb 3.2 oz (90.4 kg)   Wt Readings from Last 3 Encounters:  08/11/17 199 lb 3.2 oz (90.4 kg)  07/09/17 199 lb 6 oz (90.4 kg)  06/11/17 198 lb 4 oz (89.9 kg)    PHYSICAL EXAM: General: No resp difficulty HEENT: normal Neck: supple. JVP difficult, but does not appear elevated. Carotids 2+ bilat; no bruits. No lymphadenopathy or thryomegaly appreciated. Cor:  PMI nondisplaced. Regular rate & rhythm. No rubs, gallops or murmurs. Lungs: clear Abdomen: obese, soft, nontender, nondistended. No hepatosplenomegaly. No bruits or masses. Good bowel sounds. Extremities: no cyanosis, clubbing, rash, edema Neuro: alert & orientedx3, cranial nerves grossly intact. moves all 4 extremities w/o difficulty. Affect pleasant  ASSESSMENT & PLAN:  1. Chronic systolic CHF - History of NICM. Echo in 2013 with normal EF -Echo5/8/19LVEF 25-30%, Trivial MR, Mild TR, PA peak pressure 40 mm  Hg - R/LHC 05/29/17 with well compensated hemodynamics and minimal, non-obstructive CAD.   - NYHA II. Volume status stable on exam. Does not take daily lasix - Continue coreg 3.125 mg BID. Consider increasing next visit.  - Increase Entresto to 97/103 BID. BMET in 7 days. Samples provided.  -Continue spiro 25 mg daily.  - Continue digoxin 0.125 mg daily. Dig level 1.0 5/22, but had taken dig that morning. Check dig level in 7 days with BMET. Will have her hold am dose of digoxin that day - Repeat Echo scheduled for August. Last visit discussed the possibility of ICD if EF remains <35%. Not a CRT candidate with narrow QRS.  - Discussed importance of daily weights  2. COPD - Encouraged her to quit smoking. No change.   3. Tobacco Abuse - Discussed smoking cessation.   4. IDDM - HgB A1c 8.6 05/28/17  5. HTN - Elevated today. Increase Entresto as above.   6. Snores -Referred for sleep study. They have called her, but she is unable to complete until she has insurance.   7. No insurance - Kennyth Lose with CSW met with her today  Increase Entresto to 97/103 BID. BMET and dig level in 7 days.  Follow up in 2 weeks with pharmacy for med titration Keep Echo with Dr Haroldine Laws in 4 weeks.   Georgiana Shore, NP 08/11/17  Greater than 50% of the 25 minute visit was spent in counseling/coordination of care regarding disease state education, salt/fluid restriction, sliding scale  diuretics, and medication compliance.

## 2017-08-11 ENCOUNTER — Encounter (HOSPITAL_COMMUNITY): Payer: Self-pay

## 2017-08-11 ENCOUNTER — Ambulatory Visit (HOSPITAL_COMMUNITY)
Admission: RE | Admit: 2017-08-11 | Discharge: 2017-08-11 | Disposition: A | Discharge status: Home / Self Care | Payer: Self-pay | Source: Ambulatory Visit | Attending: Cardiology | Admitting: Cardiology

## 2017-08-11 VITALS — BP 142/88 | HR 92 | Wt 199.2 lb

## 2017-08-11 DIAGNOSIS — E669 Obesity, unspecified: Secondary | ICD-10-CM | POA: Insufficient documentation

## 2017-08-11 DIAGNOSIS — Z79899 Other long term (current) drug therapy: Secondary | ICD-10-CM | POA: Insufficient documentation

## 2017-08-11 DIAGNOSIS — R0683 Snoring: Secondary | ICD-10-CM | POA: Insufficient documentation

## 2017-08-11 DIAGNOSIS — F17210 Nicotine dependence, cigarettes, uncomplicated: Secondary | ICD-10-CM | POA: Insufficient documentation

## 2017-08-11 DIAGNOSIS — E119 Type 2 diabetes mellitus without complications: Secondary | ICD-10-CM | POA: Insufficient documentation

## 2017-08-11 DIAGNOSIS — Z720 Tobacco use: Secondary | ICD-10-CM

## 2017-08-11 DIAGNOSIS — I110 Hypertensive heart disease with heart failure: Secondary | ICD-10-CM | POA: Insufficient documentation

## 2017-08-11 DIAGNOSIS — J449 Chronic obstructive pulmonary disease, unspecified: Secondary | ICD-10-CM | POA: Insufficient documentation

## 2017-08-11 DIAGNOSIS — M5136 Other intervertebral disc degeneration, lumbar region: Secondary | ICD-10-CM | POA: Insufficient documentation

## 2017-08-11 DIAGNOSIS — I428 Other cardiomyopathies: Secondary | ICD-10-CM | POA: Insufficient documentation

## 2017-08-11 DIAGNOSIS — Z794 Long term (current) use of insulin: Secondary | ICD-10-CM | POA: Insufficient documentation

## 2017-08-11 DIAGNOSIS — Z9114 Patient's other noncompliance with medication regimen: Secondary | ICD-10-CM | POA: Insufficient documentation

## 2017-08-11 DIAGNOSIS — I5022 Chronic systolic (congestive) heart failure: Secondary | ICD-10-CM | POA: Insufficient documentation

## 2017-08-11 DIAGNOSIS — I2510 Atherosclerotic heart disease of native coronary artery without angina pectoris: Secondary | ICD-10-CM | POA: Insufficient documentation

## 2017-08-11 DIAGNOSIS — I10 Essential (primary) hypertension: Secondary | ICD-10-CM

## 2017-08-11 DIAGNOSIS — Z7982 Long term (current) use of aspirin: Secondary | ICD-10-CM | POA: Insufficient documentation

## 2017-08-11 MED ORDER — SACUBITRIL-VALSARTAN 97-103 MG PO TABS: 1.0000 | ORAL_TABLET | Freq: Two times a day (BID) | ORAL | 11 refills | Status: DC

## 2017-08-11 NOTE — Patient Instructions (Signed)
INCREASE Entresto to 97-103 mg twice daily. Double up on samples provided by clinic (Take 2 tabs in am and 2 tabs in pm).  Return in 1 week for labs. Do NOT take digoxin that morning.  _____________________________________________________________________ [place] Code:  Follow up 2 weeks.  ______________________________________________________________________ [place] Code:  Take all medication as prescribed the day of your appointment. Bring all medications with you to your appointment.  Do the following things EVERYDAY: 1) Weigh yourself in the morning before breakfast. Write it down and keep it in a log. 2) Take your medicines as prescribed 3) Eat low salt foods-Limit salt (sodium) to 2000 mg per day.  4) Stay as active as you can everyday 5) Limit all fluids for the day to less than 2 liters

## 2017-08-18 ENCOUNTER — Other Ambulatory Visit (HOSPITAL_COMMUNITY): Payer: Self-pay | Admitting: *Deleted

## 2017-08-18 ENCOUNTER — Other Ambulatory Visit (HOSPITAL_COMMUNITY): Payer: Self-pay

## 2017-08-18 DIAGNOSIS — I5022 Chronic systolic (congestive) heart failure: Secondary | ICD-10-CM

## 2017-09-02 ENCOUNTER — Ambulatory Visit (HOSPITAL_COMMUNITY)
Admission: RE | Admit: 2017-09-02 | Discharge: 2017-09-02 | Disposition: A | Discharge status: Home / Self Care | Payer: Self-pay | Source: Ambulatory Visit | Attending: Internal Medicine | Admitting: Internal Medicine

## 2017-09-02 VITALS — BP 150/98 | HR 80 | Wt 200.0 lb

## 2017-09-02 DIAGNOSIS — I429 Cardiomyopathy, unspecified: Secondary | ICD-10-CM | POA: Insufficient documentation

## 2017-09-02 DIAGNOSIS — I428 Other cardiomyopathies: Secondary | ICD-10-CM

## 2017-09-02 DIAGNOSIS — I5022 Chronic systolic (congestive) heart failure: Secondary | ICD-10-CM | POA: Insufficient documentation

## 2017-09-02 DIAGNOSIS — I110 Hypertensive heart disease with heart failure: Secondary | ICD-10-CM | POA: Insufficient documentation

## 2017-09-02 DIAGNOSIS — E119 Type 2 diabetes mellitus without complications: Secondary | ICD-10-CM | POA: Insufficient documentation

## 2017-09-02 DIAGNOSIS — Z7951 Long term (current) use of inhaled steroids: Secondary | ICD-10-CM | POA: Insufficient documentation

## 2017-09-02 DIAGNOSIS — R0683 Snoring: Secondary | ICD-10-CM | POA: Insufficient documentation

## 2017-09-02 DIAGNOSIS — J449 Chronic obstructive pulmonary disease, unspecified: Secondary | ICD-10-CM | POA: Insufficient documentation

## 2017-09-02 LAB — BASIC METABOLIC PANEL
Anion gap: 10 (ref 5–15)
BUN: 19 mg/dL (ref 6–20)
CALCIUM: 9.2 mg/dL (ref 8.9–10.3)
CO2: 25 mmol/L (ref 22–32)
CREATININE: 0.79 mg/dL (ref 0.44–1.00)
Chloride: 106 mmol/L (ref 98–111)
GFR calc Af Amer: >60 mL/min (ref >60)
GFR calc non Af Amer: >60 mL/min (ref >60)
Glucose, Bld: 79 mg/dL (ref 70–99)
Potassium: 3.6 mmol/L (ref 3.5–5.1)
SODIUM: 141 mmol/L (ref 135–145)

## 2017-09-02 LAB — MAGNESIUM: Magnesium: 2.2 mg/dL (ref 1.7–2.4)

## 2017-09-02 LAB — DIGOXIN LEVEL: DIGOXIN LVL: 0.2 ng/mL — AB (ref 0.8–2.0)

## 2017-09-02 MED ORDER — CARVEDILOL 6.25 MG PO TABS: 6.2500 mg | ORAL_TABLET | Freq: Two times a day (BID) | ORAL | 5 refills | Status: DC

## 2017-09-02 NOTE — Patient Instructions (Signed)
It was great to meet you today!  Please INCREASE your carvedilol to 6.25 mg TWICE DAILY.   Lab work today. We will call you with any changes.   Please keep your appointment with Dr. Gala Romney on 09/11/17.

## 2017-09-02 NOTE — Progress Notes (Signed)
HF MD: Haroldine Laws  HPI:  Felicia [name] is a 57 y.o. female with h/o of obesity, tobacco abuse, COPD, IDDM, HTN, NICM.  Admitted 5/7-5/10/19 with SOB and wheezing. Echo showed newly reduced EF 20-25% (previously normal 2013, but reportedly low in 2012). HF team was consulted. Underwent R/LHC that showed nonobstructive CAD and well compensated hemodynamics. She was also treated for COPD exacerbation with steroids and nebulizers. Diuresed with IV lasix. DC weight: 203 lbs.  Today she returns for pharmacist-led HF medication titration. At last visit on 7/22, her Felicia [name] was increased to goal 97-103 mg BID. Overall doing better. She is only SOB with steps and inclines. Has not missed any medication doses. Her daughters cook for her. She avoids salt and limits her fluid intake. She does not weigh at home. Continues to smoke 3-4 cigarettes/day.     . Shortness of breath/dyspnea on exertion? no  . Orthopnea/PND? no . Edema? no . Lightheadedness/dizziness? no . Daily weights at home? no . Blood pressure/heart rate monitoring at home? no . Following low-sodium/fluid-restricted diet? yes  HF Medications: Carvedilol 3.125 mg PO BID Digoxin 0.125 mg PO daily Entresto 97-103 mg PO BID Spironolactone 25 mg PO Daily   Has the patient been experiencing any side effects to the medications prescribed?  no  Does the patient have any problems obtaining medications due to transportation or finances?   Yes - no Rx insurance, will have her apply for [facility] patient assistance  Understanding of regimen: fair Understanding of indications: fair Potential of compliance: fair Patient understands to avoid NSAIDs. Patient understands to avoid decongestants.    Pertinent Lab Values: . 09/02/17: Serum creatinine 0.79, BUN 19, Potassium 3.6, Sodium 141, Magnesium 2.2, Digoxin 0.2   Vital Signs: . Weight: 200 LB (dry weight: 199 lb) . Blood pressure: 150/98 mmHg  . Heart rate: 80 bpm  Assessment: 1.  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 NYHA class IIsymptoms.  - Volume status stable  - Increase carvedilol to 6.25 mg BID   - Continue digoxin 0.125 mg daily, Entresto 97-103 mg BID and spironolactone 25 mg daily  - Basic disease state pathophysiology, medication indication, mechanism and side effects reviewed at length with patient and he verbalized understanding  2. COPD - Encouraged her to quit smoking. No change.   3. Tobacco Abuse - Discussed smoking cessation.   4. IDDM - HgB A1c 8.6 05/28/17  5. HTN - Elevated today. Increase Entresto as above.   6. Snores -Referred for sleep study. They have called her, but she is unable to complete until she has insurance.   7. No insurance - Kennyth Lose with CSW met with her today  Plan: 1) Medication changes: Based on clinical presentation, vital signs and recent labs will increase carvedilol to 6.25 mg BID 2) Labs: BMET + digoxin level today  3) Follow-up: Dr. Haroldine Laws with ECHO 09/11/17   Ruta Hinds. Velva Harman, PharmD, BCPS, CPP Clinical Pharmacist Pager: [phone] Phone: [phone] 09/02/2017 9:49 AM

## 2017-09-03 ENCOUNTER — Other Ambulatory Visit (HOSPITAL_COMMUNITY): Payer: Self-pay | Admitting: Pharmacist

## 2017-09-03 ENCOUNTER — Encounter (HOSPITAL_COMMUNITY): Payer: Self-pay | Admitting: Pharmacist

## 2017-09-03 MED ORDER — SACUBITRIL-VALSARTAN 97-103 MG PO TABS: 1.0000 | ORAL_TABLET | Freq: Two times a day (BID) | ORAL | 3 refills | Status: DC

## 2017-09-11 ENCOUNTER — Ambulatory Visit (HOSPITAL_BASED_OUTPATIENT_CLINIC_OR_DEPARTMENT_OTHER)
Admission: RE | Admit: 2017-09-11 | Discharge: 2017-09-11 | Disposition: A | Discharge status: Home / Self Care | Payer: Self-pay | Source: Ambulatory Visit | Attending: Internal Medicine | Admitting: Internal Medicine

## 2017-09-11 ENCOUNTER — Telehealth (HOSPITAL_COMMUNITY): Payer: Self-pay | Admitting: Pharmacist

## 2017-09-11 ENCOUNTER — Ambulatory Visit (HOSPITAL_COMMUNITY)
Admission: RE | Admit: 2017-09-11 | Discharge: 2017-09-11 | Disposition: A | Discharge status: Home / Self Care | Payer: Self-pay | Source: Ambulatory Visit | Attending: Internal Medicine | Admitting: Internal Medicine

## 2017-09-11 VITALS — BP 160/92 | HR 77 | Wt 201.2 lb

## 2017-09-11 DIAGNOSIS — I5022 Chronic systolic (congestive) heart failure: Secondary | ICD-10-CM | POA: Insufficient documentation

## 2017-09-11 DIAGNOSIS — I2510 Atherosclerotic heart disease of native coronary artery without angina pectoris: Secondary | ICD-10-CM | POA: Insufficient documentation

## 2017-09-11 DIAGNOSIS — I428 Other cardiomyopathies: Secondary | ICD-10-CM | POA: Insufficient documentation

## 2017-09-11 DIAGNOSIS — R0683 Snoring: Secondary | ICD-10-CM | POA: Insufficient documentation

## 2017-09-11 DIAGNOSIS — I10 Essential (primary) hypertension: Secondary | ICD-10-CM

## 2017-09-11 DIAGNOSIS — Z794 Long term (current) use of insulin: Secondary | ICD-10-CM | POA: Insufficient documentation

## 2017-09-11 DIAGNOSIS — J449 Chronic obstructive pulmonary disease, unspecified: Secondary | ICD-10-CM | POA: Insufficient documentation

## 2017-09-11 DIAGNOSIS — Z7982 Long term (current) use of aspirin: Secondary | ICD-10-CM | POA: Insufficient documentation

## 2017-09-11 DIAGNOSIS — Z79899 Other long term (current) drug therapy: Secondary | ICD-10-CM | POA: Insufficient documentation

## 2017-09-11 DIAGNOSIS — E669 Obesity, unspecified: Secondary | ICD-10-CM | POA: Insufficient documentation

## 2017-09-11 DIAGNOSIS — Z6841 Body Mass Index (BMI) 40.0 and over, adult: Secondary | ICD-10-CM | POA: Insufficient documentation

## 2017-09-11 DIAGNOSIS — E119 Type 2 diabetes mellitus without complications: Secondary | ICD-10-CM | POA: Insufficient documentation

## 2017-09-11 DIAGNOSIS — I110 Hypertensive heart disease with heart failure: Secondary | ICD-10-CM | POA: Insufficient documentation

## 2017-09-11 DIAGNOSIS — F17210 Nicotine dependence, cigarettes, uncomplicated: Secondary | ICD-10-CM | POA: Insufficient documentation

## 2017-09-11 LAB — BASIC METABOLIC PANEL
Anion gap: 6 (ref 5–15)
BUN: 20 mg/dL (ref 6–20)
CALCIUM: 9 mg/dL (ref 8.9–10.3)
CHLORIDE: 105 mmol/L (ref 98–111)
CO2: 29 mmol/L (ref 22–32)
CREATININE: 0.81 mg/dL (ref 0.44–1.00)
GFR calc Af Amer: >60 mL/min (ref >60)
GFR calc non Af Amer: >60 mL/min (ref >60)
GLUCOSE: 111 mg/dL — AB (ref 70–99)
Potassium: 3.9 mmol/L (ref 3.5–5.1)
Sodium: 140 mmol/L (ref 135–145)

## 2017-09-11 LAB — BRAIN NATRIURETIC PEPTIDE: B Natriuretic Peptide: 47.7 pg/mL (ref 0.0–100.0)

## 2017-09-11 MED ORDER — ISOSORBIDE MONONITRATE ER 30 MG PO TB24: 30.0000 mg | ORAL_TABLET | Freq: Every day | ORAL | 6 refills | Status: DC

## 2017-09-11 MED ORDER — HYDRALAZINE HCL 25 MG PO TABS: 25.0000 mg | ORAL_TABLET | Freq: Three times a day (TID) | ORAL | 6 refills | Status: DC

## 2017-09-11 NOTE — Progress Notes (Signed)
  Echocardiogram 2D Echocardiogram has been performed.  Delcie Roch 09/11/2017, 11:56 AM

## 2017-09-11 NOTE — Progress Notes (Signed)
Advanced Heart Failure Clinic Note   PCP: Patient, No Pcp Per PCP-Cardiologist: Dr Gala Romney  HPI: Felicia [name] is a 57 y.o. female with h/o of obesity, tobacco abuse, COPD, probable OSA, IDDM, HTN, NICM.  Admitted 5/7-5/10/19 with SOB and wheezing. Echo showed newly reduced EF 20-25% (previously normal 2013, but reportedly low in 2012). HF team was consulted. Underwent R/LHC that showed nonobstructive CAD and well compensated hemodynamics. She was also treated for COPD exacerbation with steroids and nebulizers. Diuresed with IV lasix. DC weight: 203 lbs.  Today she returns for HF follow up. Says she is feeling better. Can do all ADLs and go to store without too much problem. No edema, orthopnea, PND. Compliant with meds. Smoking 5 cigs/day. Not taking BP at home   Echo today EF 40-45% RV normal Personally reviewed   Review of systems complete and found to be negative unless listed in HPI.   SH: Lives at home with daughters, manages her own medications, daughters drive her to appointments.  No ETOH or drugs.   [facility] 05/29/17  Prox LAD lesion is 20% stenosed.  Mid Cx lesion is 40% stenosed. Ao = 132/80 (100) LV = 127/19 RA = 7 RV = 38/8 PA = 39/12 (26) PCW = 14 Fick cardiac output/index = 5.3/2.9 PVR = 2.3 WU SVR = 1408 Ao sat = 93% PA sat = 66%, 68%  Assessment: 1. Co-dominant coronary system with separate ostial for LAD and LCx 2. Minimal non-obstructive CAD 3. NICM EF 25-30%  4. Well-compensated hemodynamics  Echo5/8/19LVEF 25-30%, Trivial MR, Mild TR, PA peak pressure 40 mm Hg  Past Medical History:  Diagnosis Date  . Cardiomyopathy   . CHF (congestive heart failure) (HCC)    minamal  . COPD (chronic obstructive pulmonary disease) (HCC)   . DDD (degenerative disc disease), lumbar   . Diabetes mellitus   . Hypertension   . Non compliance w medication regimen   . Normal echocardiogram    LVEF 25-30% 03/27/2010    Current Outpatient Medications    Medication Sig Dispense Refill  . acetaminophen (TYLENOL) 500 MG tablet Take 1,500 mg by mouth daily as needed for mild pain or headache.    . albuterol (PROVENTIL HFA;VENTOLIN HFA) 108 (90 Base) MCG/ACT inhaler Inhale 2 puffs into the lungs every 6 (six) hours as needed for wheezing or shortness of breath. 1 Inhaler 0  . aspirin 81 MG chewable tablet Chew 81 mg by mouth daily.    Marland Kitchen bismuth subsalicylate (PEPTO BISMOL) 262 MG/15ML suspension Take 30 mLs by mouth as needed for indigestion.    . carvedilol (COREG) 6.25 MG tablet Take 1 tablet (6.25 mg total) by mouth 2 (two) times daily with a meal. 60 tablet 5  . digoxin (LANOXIN) 0.125 MG tablet Take 1 tablet (0.125 mg total) by mouth daily. 30 tablet 3  . diphenhydrAMINE (BENADRYL) 25 MG tablet Take 25 mg by mouth as needed for allergies.    . diphenhydramine-acetaminophen (TYLENOL PM EXTRA STRENGTH) 25-500 MG TABS tablet Take 2 tablets by mouth at bedtime as needed.    Marland Kitchen ibuprofen (ADVIL,MOTRIN) 200 MG tablet Take 800 mg by mouth as needed for moderate pain.    Marland Kitchen insulin NPH-regular Human (NOVOLIN 70/30) (70-30) 100 UNIT/ML injection Inject 40 Units into the skin 2 (two) times daily. 24 mL 0  . ipratropium-albuterol (DUONEB) 0.5-2.5 (3) MG/3ML SOLN Take 3 mLs by nebulization every 6 (six) hours as needed. 360 mL 0  . Multiple Vitamin (MULTIVITAMIN WITH MINERALS) TABS  tablet Take 1 tablet by mouth daily.    . sacubitril-valsartan (ENTRESTO) 97-103 MG Take 1 tablet by mouth 2 (two) times daily. 180 tablet 3  . spironolactone (ALDACTONE) 25 MG tablet Take 1 tablet (25 mg total) by mouth daily. 30 tablet 3   No current facility-administered medications for this encounter.     Allergies  Allergen Reactions  . Banana Itching  . Latex Itching      Social History   Socioeconomic History  . Marital status: Single    Spouse name: Not on file  . Number of children: 2  . Years of education: Not on file  . Highest education level: Not on file   Occupational History  . Not on file  Social Needs  . Financial resource strain: Not on file  . Food insecurity:    Worry: Not on file    Inability: Not on file  . Transportation needs:    Medical: Not on file    Non-medical: Not on file  Tobacco Use  . Smoking status: Current Every Day Smoker    Packs/day: 0.50    Types: Cigarettes  . Smokeless tobacco: Never Used  Substance and Sexual Activity  . Alcohol use: No  . Drug use: No  . Sexual activity: Yes    Birth control/protection: Surgical, Post-menopausal  Lifestyle  . Physical activity:    Days per week: Not on file    Minutes per session: Not on file  . Stress: Not on file  Relationships  . Social connections:    Talks on phone: Not on file    Gets together: Not on file    Attends religious service: Not on file    Active member of club or organization: Not on file    Attends meetings of clubs or organizations: Not on file    Relationship status: Not on file  . Intimate partner violence:    Fear of current or ex partner: Not on file    Emotionally abused: Not on file    Physically abused: Not on file    Forced sexual activity: Not on file  Other Topics Concern  . Not on file  Social History Narrative  . Not on file      Family History  Problem Relation Age of Onset  . Stroke Mother   . Lung cancer Father     Vitals:   09/11/17 1159  BP: (!) 160/92  Pulse: 77  SpO2: 95%  Weight: 91.3 kg (201 lb 3.2 oz)   Wt Readings from Last 3 Encounters:  09/11/17 91.3 kg (201 lb 3.2 oz)  09/02/17 90.7 kg (200 lb)  08/11/17 90.4 kg (199 lb 3.2 oz)    PHYSICAL EXAM: General:  Well appearing. No resp difficulty HEENT: normal Neck: supple. no JVD. Carotids 2+ bilat; no bruits. No lymphadenopathy or thryomegaly appreciated. Cor: PMI nondisplaced. Regular rate & rhythm. No rubs, gallops or murmurs. Lungs: clear Abdomen:obese oft, nontender, nondistended. No hepatosplenomegaly. No bruits or masses. Good bowel  sounds. Extremities: no cyanosis, clubbing, rash, edema Neuro: alert & orientedx3, cranial nerves grossly intact. moves all 4 extremities w/o difficulty. Affect pleasant  ASSESSMENT & PLAN:  1. Chronic systolic CHF - History of NICM. Echo in 2013 with normal EF. Suspect HTN cardiomyopathy (vs OSA) -Echo5/8/19LVEF 25-30%, Trivial MR, Mild TR, PA peak pressure 40 mm Hg - R/LHC 05/29/17 with well compensated hemodynamics and minimal, non-obstructive CAD.   - Echo today EF improving 40-45%  - NYHA II. Volume status  stable on exam. Does not take daily lasix - Continue coreg 6.225 mg BID. - Continue Entresto to 97/103 BID.  -Continue spiro 25 mg daily.  - Stop digoxin  - Start hydralazine 25 tid and Imdur 30 daily - Labs today  2. COPD - Encouraged her to quit smoking. No change.   3. Tobacco Abuse - Reinforced need for smoking cessation.   4. IDDM - HgB A1c 8.6 05/28/17 - Consider Jardiance. Encouraged [facility] to [facility] to [facility]CP  5. HTN -Not controlled. Start hydralazine 25 tid and Imdur 30 daily   6. Snores -Referred for sleep study. They have called her, but she is unable to complete until she has insurance.   Arvilla Meres, MD 09/11/17

## 2017-09-11 NOTE — Patient Instructions (Addendum)
You have been approved to receive Entresto for free from [employer], the pharmacy should be contacting you today to get it shipped to you.  IF you do not hear from them by tomorrow please call [employer] at [phone] to follow up.  Stop Digoxin  Start Hydralazine 25 mg Three times a day   Start Isosorbide (Imdur) 30 mg daily  Labs today  Your physician recommends that you schedule a follow-up appointment in: 4 months

## 2017-09-11 NOTE — Addendum Note (Signed)
Encounter addended by: Noralee Space, RN on: 09/11/2017 1:16 PM  Actions taken: Medication long-term status modified, Diagnosis association updated, Order list changed, Sign clinical note

## 2017-09-11 NOTE — Telephone Encounter (Signed)
[employer] patient assistance approved for Entresto 97-103 mg BID through 09/11/18.   Tyler Deis. Bonnye Fava, PharmD, BCPS, CPP Clinical Pharmacist Phone: [phone] 09/11/2017 4:06 PM

## 2017-09-11 NOTE — Progress Notes (Signed)
Pt states she is out of Entresto at this time, application was faxed to [employer] on 8/14.  Called [employer] they state pt was approved to receive Entrestro for free from 09/10/17 through 09/11/18 and that the rx was just sent to the pharmacy yesterday.  They states pharmacy should be contacting pt today and then it will take 1-3 days before she recieves it.   Pt is aware of all above, have provided her a week worth of samples of the 49/51 mg tabs, she is aware to take 2 tabs Twice daily.  If she does not hear from pharmacy today or tomorrow she will call [employer].  Medication Samples have been provided to the patient.  Drug name: Sherryll Burger       Strength: 49/51mg         Qty: 1 LOT: [id number]  Exp.Date: 10/21  Dosing instructions: Take 2 tabs Twice daily   The patient has been instructed regarding the correct time, dose, and frequency of taking this medication, including desired effects and most common side effects.   Saara Kijowski 12:28 PM 09/11/2017

## 2017-12-05 ENCOUNTER — Other Ambulatory Visit (HOSPITAL_COMMUNITY): Payer: Self-pay | Admitting: Adult Health

## 2018-01-07 ENCOUNTER — Inpatient Hospital Stay (HOSPITAL_COMMUNITY)
Admission: RE | Admit: 2018-01-07 | Discharge: 2018-01-07 | Disposition: A | Discharge status: Home / Self Care | Payer: Self-pay | Source: Ambulatory Visit

## 2018-01-12 ENCOUNTER — Encounter (HOSPITAL_COMMUNITY): Payer: Self-pay | Admitting: Emergency Medicine

## 2018-01-12 ENCOUNTER — Emergency Department (HOSPITAL_COMMUNITY): Payer: Self-pay

## 2018-01-12 ENCOUNTER — Observation Stay (HOSPITAL_COMMUNITY)
Admission: EM | Admit: 2018-01-12 | Discharge: 2018-01-14 | Disposition: A | Discharge status: Home / Self Care | Payer: Self-pay | Attending: Internal Medicine | Admitting: Internal Medicine

## 2018-01-12 DIAGNOSIS — R112 Nausea with vomiting, unspecified: Secondary | ICD-10-CM | POA: Diagnosis present

## 2018-01-12 DIAGNOSIS — F17210 Nicotine dependence, cigarettes, uncomplicated: Secondary | ICD-10-CM | POA: Insufficient documentation

## 2018-01-12 DIAGNOSIS — N179 Acute kidney failure, unspecified: Secondary | ICD-10-CM

## 2018-01-12 DIAGNOSIS — E11311 Type 2 diabetes mellitus with unspecified diabetic retinopathy with macular edema: Secondary | ICD-10-CM

## 2018-01-12 DIAGNOSIS — M5136 Other intervertebral disc degeneration, lumbar region: Secondary | ICD-10-CM | POA: Insufficient documentation

## 2018-01-12 DIAGNOSIS — R197 Diarrhea, unspecified: Secondary | ICD-10-CM

## 2018-01-12 DIAGNOSIS — E119 Type 2 diabetes mellitus without complications: Secondary | ICD-10-CM

## 2018-01-12 DIAGNOSIS — J101 Influenza due to other identified influenza virus with other respiratory manifestations: Principal | ICD-10-CM

## 2018-01-12 DIAGNOSIS — Z794 Long term (current) use of insulin: Secondary | ICD-10-CM

## 2018-01-12 DIAGNOSIS — R7989 Other specified abnormal findings of blood chemistry: Secondary | ICD-10-CM

## 2018-01-12 DIAGNOSIS — I428 Other cardiomyopathies: Secondary | ICD-10-CM

## 2018-01-12 DIAGNOSIS — E1065 Type 1 diabetes mellitus with hyperglycemia: Secondary | ICD-10-CM | POA: Insufficient documentation

## 2018-01-12 DIAGNOSIS — I5022 Chronic systolic (congestive) heart failure: Secondary | ICD-10-CM | POA: Diagnosis present

## 2018-01-12 DIAGNOSIS — Z791 Long term (current) use of non-steroidal anti-inflammatories (NSAID): Secondary | ICD-10-CM | POA: Insufficient documentation

## 2018-01-12 DIAGNOSIS — R55 Syncope and collapse: Secondary | ICD-10-CM

## 2018-01-12 DIAGNOSIS — I110 Hypertensive heart disease with heart failure: Secondary | ICD-10-CM | POA: Insufficient documentation

## 2018-01-12 DIAGNOSIS — E860 Dehydration: Secondary | ICD-10-CM

## 2018-01-12 DIAGNOSIS — J441 Chronic obstructive pulmonary disease with (acute) exacerbation: Secondary | ICD-10-CM | POA: Insufficient documentation

## 2018-01-12 DIAGNOSIS — J449 Chronic obstructive pulmonary disease, unspecified: Secondary | ICD-10-CM | POA: Diagnosis present

## 2018-01-12 DIAGNOSIS — Z79899 Other long term (current) drug therapy: Secondary | ICD-10-CM | POA: Insufficient documentation

## 2018-01-12 DIAGNOSIS — Z7982 Long term (current) use of aspirin: Secondary | ICD-10-CM | POA: Insufficient documentation

## 2018-01-12 DIAGNOSIS — R778 Other specified abnormalities of plasma proteins: Secondary | ICD-10-CM

## 2018-01-12 DIAGNOSIS — I10 Essential (primary) hypertension: Secondary | ICD-10-CM | POA: Diagnosis present

## 2018-01-12 LAB — COMPREHENSIVE METABOLIC PANEL
ALT: 32 U/L (ref 0–44)
ANION GAP: 16 — AB (ref 5–15)
AST: 45 U/L — ABNORMAL HIGH (ref 15–41)
Albumin: 3.6 g/dL (ref 3.5–5.0)
Alkaline Phosphatase: 94 U/L (ref 38–126)
BUN: 24 mg/dL — ABNORMAL HIGH (ref 6–20)
CHLORIDE: 98 mmol/L (ref 98–111)
CO2: 21 mmol/L — ABNORMAL LOW (ref 22–32)
CREATININE: 1.68 mg/dL — AB (ref 0.44–1.00)
Calcium: 8.7 mg/dL — ABNORMAL LOW (ref 8.9–10.3)
GFR calc Af Amer: 39 mL/min — ABNORMAL LOW (ref >60)
GFR, EST NON AFRICAN AMERICAN: 34 mL/min — AB (ref >60)
Glucose, Bld: 195 mg/dL — ABNORMAL HIGH (ref 70–99)
Potassium: 4.1 mmol/L (ref 3.5–5.1)
Sodium: 135 mmol/L (ref 135–145)
Total Bilirubin: 0.7 mg/dL (ref 0.3–1.2)
Total Protein: 7.2 g/dL (ref 6.5–8.1)

## 2018-01-12 LAB — I-STAT CHEM 8, ED
BUN: 29 mg/dL — AB (ref 6–20)
Calcium, Ion: 1.03 mmol/L — ABNORMAL LOW (ref 1.15–1.40)
Chloride: 100 mmol/L (ref 98–111)
Creatinine, Ser: 1.5 mg/dL — ABNORMAL HIGH (ref 0.44–1.00)
Glucose, Bld: 194 mg/dL — ABNORMAL HIGH (ref 70–99)
HCT: 47 % — ABNORMAL HIGH (ref 36.0–46.0)
HEMOGLOBIN: 16 g/dL — AB (ref 12.0–15.0)
POTASSIUM: 3.9 mmol/L (ref 3.5–5.1)
SODIUM: 133 mmol/L — AB (ref 135–145)
TCO2: 25 mmol/L (ref 22–32)

## 2018-01-12 LAB — CBC WITH DIFFERENTIAL/PLATELET
ABS IMMATURE GRANULOCYTES: 0.1 10*3/uL — AB (ref 0.00–0.07)
BASOS PCT: 0 %
Basophils Absolute: 0 10*3/uL (ref 0.0–0.1)
EOS ABS: 0 10*3/uL (ref 0.0–0.5)
EOS PCT: 0 %
HCT: 45.9 % (ref 36.0–46.0)
Hemoglobin: 14.3 g/dL (ref 12.0–15.0)
Immature Granulocytes: 1 %
Lymphocytes Relative: 28 %
Lymphs Abs: 2.3 10*3/uL (ref 0.7–4.0)
MCH: 28.1 pg (ref 26.0–34.0)
MCHC: 31.2 g/dL (ref 30.0–36.0)
MCV: 90.2 fL (ref 80.0–100.0)
MONO ABS: 0.7 10*3/uL (ref 0.1–1.0)
MONOS PCT: 8 %
Neutro Abs: 5 10*3/uL (ref 1.7–7.7)
Neutrophils Relative %: 63 %
PLATELETS: 225 10*3/uL (ref 150–400)
RBC: 5.09 MIL/uL (ref 3.87–5.11)
RDW: 16 % — AB (ref 11.5–15.5)
WBC: 8.1 10*3/uL (ref 4.0–10.5)
nRBC: 0 % (ref 0.0–0.2)

## 2018-01-12 LAB — I-STAT TROPONIN, ED: Troponin i, poc: 0.1 ng/mL (ref 0.00–0.08)

## 2018-01-12 LAB — CBG MONITORING, ED: GLUCOSE-CAPILLARY: 184 mg/dL — AB (ref 70–99)

## 2018-01-12 LAB — INFLUENZA PANEL BY PCR (TYPE A & B)
INFLBPCR: POSITIVE — AB
Influenza A By PCR: NEGATIVE

## 2018-01-12 LAB — I-STAT CG4 LACTIC ACID, ED: Lactic Acid, Venous: 1.28 mmol/L (ref 0.5–1.9)

## 2018-01-12 MED ORDER — SODIUM CHLORIDE 0.9 % IV SOLN
INTRAVENOUS | Status: DC
  Administered 2018-01-12 – 2018-01-14 (×4): via INTRAVENOUS

## 2018-01-12 MED ORDER — SODIUM CHLORIDE 0.9 % IV BOLUS
1000.0000 mL | Freq: Once | INTRAVENOUS | Status: AC
  Administered 2018-01-12: 1000 mL via INTRAVENOUS

## 2018-01-12 NOTE — ED Notes (Signed)
Patient transported to CT 

## 2018-01-12 NOTE — ED Provider Notes (Signed)
[facility] EMERGENCY DEPARTMENT Provider Note   CSN: [id number] Arrival date & time: 01/12/18  2056     History   Chief Complaint Chief Complaint  Patient presents with  . Fever    HPI Felicia [name] is a 57 y.o. female with a past medical history of DM, hypertension, CHF with reduced ejection fraction, COPD, cardiomyopathy, who presents today for evaluation of syncope.  History obtained primarily from patient's children.  Patient was at home and has not been feeling well for the past 2 days with nausea vomiting and diarrhea.  She was at home and went to use the bathroom and had a syncopal episode.  Her son says that she has been laying in bed for the past 2 days due to her not feeling well.  She reportedly passed out at home in the bathroom.  She was here and had to use the bathroom during which she reportedly had a syncopal episode witnessed by her children.  They say that she was fully unconscious.  She denies chest pain, does note that occasionally her chest feels tight however says it has been like that since her she started vomiting.  She denies abd pain.  She tells me that her daughter gave her her insulin shot today, she tried to drink some chicken broth however has had generally poor p.o. intake.  Patient tells me that today whenever she attempts to sit up or stand up she has been seeing dark flashes in both of her eyes and reports that that goes away when she lays down.  HPI  Past Medical History:  Diagnosis Date  . Cardiomyopathy   . CHF (congestive heart failure) (HCC)    minamal  . COPD (chronic obstructive pulmonary disease) (HCC)   . DDD (degenerative disc disease), lumbar   . Diabetes mellitus   . Hypertension   . Non compliance w medication regimen   . Normal echocardiogram    LVEF 25-30% 03/27/2010    Patient Active Problem List   Diagnosis Date Noted  . COPD (chronic obstructive pulmonary disease) (HCC) 06/11/2017  . Chronic systolic  heart failure (HCC) 32/20/2542  . Snoring 06/11/2017  . Acute systolic heart failure (HCC)   . COPD with acute exacerbation (HCC) 05/27/2017  . Obesity 05/27/2017  . Tobacco use 05/27/2017  . Acute respiratory failure with hypoxia (HCC) 05/27/2017  . Hyperglycemia 01/13/2015  . Back abscess 01/13/2015  . Uncontrolled diabetes mellitus (HCC) 01/13/2015  . Hypertension 05/31/2011  . Nonischemic cardiomyopathy (HCC) 04/11/2010  . Diabetes mellitus type I (HCC) 04/11/2010    Past Surgical History:  Procedure Laterality Date  . CARDIAC CATHETERIZATION    . RIGHT/LEFT HEART CATH AND CORONARY ANGIOGRAPHY N/A 05/29/2017   Procedure: RIGHT/LEFT HEART CATH AND CORONARY ANGIOGRAPHY;  Surgeon: Dolores Patty, MD;  Location: [facility] INVASIVE CV LAB;  Service: Cardiovascular;  Laterality: N/A;  . TUBAL LIGATION       OB History    Gravida  7   Para  5   Term  5   Preterm      AB  2   Living  5     SAB  2   TAB      Ectopic      Multiple      Live Births               Home Medications    Prior to Admission medications   Medication Sig Start Date End Date Taking? Authorizing Provider  acetaminophen (TYLENOL) 500 MG tablet Take 1,500 mg by mouth daily as needed for mild pain or headache.    [provider]  albuterol (PROVENTIL HFA;VENTOLIN HFA) 108 (90 Base) MCG/ACT inhaler Inhale 2 puffs into the lungs every 6 (six) hours as needed for wheezing or shortness of breath. 05/30/17   Burnadette PopAdhikari, Amrit, MD  aspirin 81 MG chewable tablet Chew 81 mg by mouth daily. 04/16/16   [provider]  bismuth subsalicylate (PEPTO BISMOL) 262 MG/15ML suspension Take 30 mLs by mouth as needed for indigestion.    [provider]  carvedilol (COREG) 6.25 MG tablet Take 1 tablet (6.25 mg total) by mouth 2 (two) times daily with a meal. 09/02/17   Bensimhon, Bevelyn Bucklesaniel R, MD  diphenhydrAMINE (BENADRYL) 25 MG tablet Take 25 mg by mouth as needed for allergies.    [provider]  diphenhydramine-acetaminophen (TYLENOL PM EXTRA STRENGTH) 25-500 MG TABS tablet Take 2 tablets by mouth at bedtime as needed.    [provider]  hydrALAZINE (APRESOLINE) 25 MG tablet Take 1 tablet (25 mg total) by mouth 3 (three) times daily. 09/11/17 12/10/17  Bensimhon, Bevelyn Bucklesaniel R, MD  ibuprofen (ADVIL,MOTRIN) 200 MG tablet Take 800 mg by mouth as needed for moderate pain.    [provider]  insulin NPH-regular Human (NOVOLIN 70/30) (70-30) 100 UNIT/ML injection Inject 40 Units into the skin 2 (two) times daily. 05/30/17 09/03/18  Burnadette PopAdhikari, Amrit, MD  ipratropium-albuterol (DUONEB) 0.5-2.5 (3) MG/3ML SOLN Take 3 mLs by nebulization every 6 (six) hours as needed. 05/30/17 09/03/18  Burnadette PopAdhikari, Amrit, MD  isosorbide mononitrate (IMDUR) 30 MG 24 hr tablet Take 1 tablet (30 mg total) by mouth daily. 09/11/17 12/10/17  Bensimhon, Bevelyn Bucklesaniel R, MD  Multiple Vitamin (MULTIVITAMIN WITH MINERALS) TABS tablet Take 1 tablet by mouth daily.    [provider]  sacubitril-valsartan (ENTRESTO) 97-103 MG Take 1 tablet by mouth 2 (two) times daily. 09/03/17   Bensimhon, Bevelyn Bucklesaniel R, MD  spironolactone (ALDACTONE) 25 MG tablet TAKE 1 TABLET BY MOUTH ONCE DAILY 12/05/17   Bensimhon, Bevelyn Bucklesaniel R, MD    Family History Family History  Problem Relation Age of Onset  . Stroke Mother   . Lung cancer Father     Social History Social History   Tobacco Use  . Smoking status: Current Every Day Smoker    Packs/day: 0.50    Types: Cigarettes  . Smokeless tobacco: Never Used  Substance Use Topics  . Alcohol use: No  . Drug use: No     Allergies   Banana and Latex   Review of Systems Review of Systems  Unable to perform ROS: Acuity of condition     Physical Exam Updated Vital Signs BP (!) 106/59   Pulse 80   Temp 99.7 F (37.6 C) (Rectal)   Resp (!) 22   Ht 4\' 11"  (1.499 m)   Wt 90.7 kg   LMP 08/02/2010   SpO2 100%   BMI 40.40 kg/m   Physical Exam Vitals signs and  nursing note reviewed.  Constitutional:      General: She is in acute distress.     Appearance: She is well-developed. She is diaphoretic.  HENT:     Head: Normocephalic and atraumatic.  Eyes:     General: No scleral icterus.       Right eye: No discharge.        Left eye: No discharge.     Conjunctiva/sclera: Conjunctivae normal.  Neck:     Musculoskeletal:  Normal range of motion.  Cardiovascular:     Rate and Rhythm: Normal rate and regular rhythm.  Pulmonary:     Effort: Pulmonary effort is normal. No respiratory distress.     Breath sounds: No stridor.  Abdominal:     General: There is no distension.     Tenderness: There is no abdominal tenderness. There is no guarding.  Musculoskeletal:        General: No deformity.  Skin:    General: Skin is warm.  Neurological:     General: No focal deficit present.     Mental Status: She is alert and oriented to person, place, and time.     Motor: No abnormal muscle tone.  Psychiatric:        Behavior: Behavior normal.      ED Treatments / Results  Labs (all labs ordered are listed, but only abnormal results are displayed) Labs Reviewed  CBC WITH DIFFERENTIAL/PLATELET - Abnormal; Notable for the following components:      Result Value   RDW 16.0 (*)    Abs Immature Granulocytes 0.10 (*)    All other components within normal limits  COMPREHENSIVE METABOLIC PANEL - Abnormal; Notable for the following components:   CO2 21 (*)    Glucose, Bld 195 (*)    BUN 24 (*)    Creatinine, Ser 1.68 (*)    Calcium 8.7 (*)    AST 45 (*)    GFR calc non Af Amer 34 (*)    GFR calc Af Amer 39 (*)    Anion gap 16 (*)    All other components within normal limits  INFLUENZA PANEL BY PCR (TYPE A & B) - Abnormal; Notable for the following components:   Influenza B By PCR POSITIVE (*)    All other components within normal limits  CBG MONITORING, ED - Abnormal; Notable for the following components:   Glucose-Capillary 184 (*)    All other  components within normal limits  I-STAT CHEM 8, ED - Abnormal; Notable for the following components:   Sodium 133 (*)    BUN 29 (*)    Creatinine, Ser 1.50 (*)    Glucose, Bld 194 (*)    Calcium, Ion 1.03 (*)    Hemoglobin 16.0 (*)    HCT 47.0 (*)    All other components within normal limits  I-STAT TROPONIN, ED - Abnormal; Notable for the following components:   Troponin i, poc 0.10 (*)    All other components within normal limits  CULTURE, BLOOD (ROUTINE X 2)  CULTURE, BLOOD (ROUTINE X 2)  URINALYSIS, ROUTINE W REFLEX MICROSCOPIC  I-STAT CG4 LACTIC ACID, ED  I-STAT CG4 LACTIC ACID, ED    EKG EKG Interpretation  Date/Time:  Monday January 12 2018 21:17:26 EST Ventricular Rate:  81 PR Interval:    QRS Duration: 93 QT Interval:  424 QTC Calculation: 493 R Axis:   2 Text Interpretation:  Sinus rhythm Right atrial enlargement Borderline prolonged QT interval Baseline wander in lead(s) II III aVR aVF V1 V2 V5 V6 Since last tracing rate slower Confirmed by Jacalyn Lefevre [id number]) on 01/12/2018 9:39:28 PM   Radiology Ct Head Wo Contrast  Result Date: 01/12/2018 CLINICAL DATA:  Dizziness and fever. Syncopal episode in emergency department. History of hypertension and diabetes. EXAM: CT HEAD WITHOUT CONTRAST CT CERVICAL SPINE WITHOUT CONTRAST TECHNIQUE: Multidetector CT imaging of the head and cervical spine was performed following the standard protocol without intravenous contrast. Multiplanar CT image reconstructions of  the cervical spine were also generated. COMPARISON:  None. CT neck September 24, 2012 FINDINGS: CT HEAD FINDINGS BRAIN: No intraparenchymal hemorrhage, mass effect nor midline shift. The ventricles and sulci are normal for age. Patchy supratentorial white matter hypodensities. Mildly heterogeneous basal ganglia with chronic small vessel ischemic changes. No acute large vascular territory infarcts. No abnormal extra-axial fluid collections. Basal cisterns are patent.  VASCULAR: Moderate calcific atherosclerosis of the carotid siphons. SKULL: No skull fracture. No significant scalp soft tissue swelling. SINUSES/ORBITS: Trace paranasal sinus mucosal thickening. Mastoid air cells are well aerated.The included ocular globes and orbital contents are non-suspicious. OTHER: None. CT CERVICAL SPINE FINDINGS- Large body habitus results in overall noisy image quality. ALIGNMENT: Straightened lordosis.  Vertebral bodies in alignment. 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 Cervical vertebral bodies and posterior elements are otherwise intact. Intervertebral disc heights preserved. No destructive bony lesions. C1-2 articulation maintained. SOFT TISSUES AND SPINAL CANAL: Nonacute. Mild calcific atherosclerosis carotid bifurcations. DISC LEVELS: No significant osseous canal stenosis or neural foraminal narrowing. UPPER CHEST: Lung apices are clear. OTHER: None. IMPRESSION: CT HEAD: 1. No acute intracranial process. 2. Mild chronic small vessel ischemic changes. 3. Moderate intracranial atherosclerosis. CT CERVICAL SPINE: 1. No fracture or malalignment. No advanced degenerative change for age. Electronically Signed   By: Awilda Metro M.D.   On: 01/12/2018 23:17   Ct Cervical Spine Wo Contrast  Result Date: 01/12/2018 CLINICAL DATA:  Dizziness and fever. Syncopal episode in emergency department. History of hypertension and diabetes. EXAM: CT HEAD WITHOUT CONTRAST CT CERVICAL SPINE WITHOUT CONTRAST TECHNIQUE: Multidetector CT imaging of the head and cervical spine was performed following the standard protocol without intravenous contrast. Multiplanar CT image reconstructions of the cervical spine were also generated. COMPARISON:  None. CT neck September 24, 2012 FINDINGS: CT HEAD FINDINGS BRAIN: No intraparenchymal hemorrhage, mass effect nor midline shift. The ventricles and sulci are normal for age. Patchy supratentorial white matter hypodensities. Mildly  heterogeneous basal ganglia with chronic small vessel ischemic changes. No acute large vascular territory infarcts. No abnormal extra-axial fluid collections. Basal cisterns are patent. VASCULAR: Moderate calcific atherosclerosis of the carotid siphons. SKULL: No skull fracture. No significant scalp soft tissue swelling. SINUSES/ORBITS: Trace paranasal sinus mucosal thickening. Mastoid air cells are well aerated.The included ocular globes and orbital contents are non-suspicious. OTHER: None. CT CERVICAL SPINE FINDINGS- Large body habitus results in overall noisy image quality. ALIGNMENT: Straightened lordosis.  Vertebral bodies in alignment. 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 Cervical vertebral bodies and posterior elements are otherwise intact. Intervertebral disc heights preserved. No destructive bony lesions. C1-2 articulation maintained. SOFT TISSUES AND SPINAL CANAL: Nonacute. Mild calcific atherosclerosis carotid bifurcations. DISC LEVELS: No significant osseous canal stenosis or neural foraminal narrowing. UPPER CHEST: Lung apices are clear. OTHER: None. IMPRESSION: CT HEAD: 1. No acute intracranial process. 2. Mild chronic small vessel ischemic changes. 3. Moderate intracranial atherosclerosis. CT CERVICAL SPINE: 1. No fracture or malalignment. No advanced degenerative change for age. Electronically Signed   By: Awilda Metro M.D.   On: 01/12/2018 23:17   Dg Chest Port 1 View  Result Date: 01/12/2018 CLINICAL DATA:  57 year old female with respiratory distress. EXAM: PORTABLE CHEST 1 VIEW COMPARISON:  CTA chest 05/27/2017 and earlier. FINDINGS: Portable AP semi upright view at 2121 hours. Stable mild cardiomegaly. Other mediastinal contours are within normal limits. Visualized tracheal air column is within normal limits. Allowing for portable technique the lungs are clear. No pneumothorax or pleural effusion. Paucity bowel  gas in the upper abdomen. No acute osseous  abnormality identified. IMPRESSION: No acute cardiopulmonary abnormality. Electronically Signed   By: Odessa Fleming M.D.   On: 01/12/2018 21:32    Procedures .Critical Care Performed by: Cristina Gong, PA-C Authorized by: Cristina Gong, PA-C   Critical care provider statement:    Critical care time (minutes):  45   Critical care was time spent personally by me on the following activities:  Discussions with consultants, evaluation of patient's response to treatment, examination of patient, ordering and performing treatments and interventions, ordering and review of laboratory studies, ordering and review of radiographic studies, pulse oximetry, re-evaluation of patient's condition, obtaining history from patient or surrogate and review of old charts   (including critical care time)  Medications Ordered in ED Medications  sodium chloride 0.9 % bolus 1,000 mL (0 mLs Intravenous Stopped 01/12/18 2250)    And  0.9 %  sodium chloride infusion ( Intravenous New Bag/Given 01/12/18 2251)     Initial Impression / Assessment and Plan / ED Course  I have reviewed the triage vital signs and the nursing notes.  Pertinent labs & imaging results that were available during my care of the patient were reviewed by me and considered in my medical decision making (see chart for details).  Clinical Course as of Jan 13 34  Mon Jan 12, 2018  2216 Patient re-evaluated, she is much improved, feeling better.     [EH]  Tue Jan 13, 2018  0028 Spoke with Dr. Julian Reil who will admit patient.    [EH]    Clinical Course User Index [EH] Cristina Gong, PA-C   Patient presents today for evaluation after having a syncopal event at home and having a syncopal event while in the wheelchair in the lobby.  She has been sick recently with nausea vomiting and diarrhea raising concern for dehydration.  On initial evaluation she was cool clammy and diaphoretic.  She was not hypoglycemic and was normotensive.   She was given 1 liter fluid bolus with significant improvement in her symptoms.  She was afebrile in the department, however influenza B was positive.  Chest x-ray did not show evidence of acute abnormalities.  Given multiple syncopal events and unsure if she hit her head CT head and neck were obtained which did not show acute abnormalities.  Labs show evidence of AKI with a creatinine bump from baseline of approximately 0.6-1.6 with decreased GFR.  She is not anemic.  She does not have chest pain.  Troponin is mildly elevated at 0.1, suspect that this is a combination of her chronic CHF combined with dehydration and AKI.  This patient was seen as a shared visit with Dr. Particia Nearing.  Given dehydration with AKI and multiple syncopal events I spoke with Dr. Julian Reil who agreed to admit the patient.   Final Clinical Impressions(s) / ED Diagnoses   Final diagnoses:  Influenza B  Nausea vomiting and diarrhea  Syncope and collapse  Dehydration  Elevated troponin  AKI (acute kidney injury) [facility])    ED Discharge Orders    None       Cristina Gong, PA-C 01/13/18 9811    Jacalyn Lefevre, MD 01/15/18 1622

## 2018-01-12 NOTE — ED Triage Notes (Signed)
Pt in POV reporting dizziness, fever, N/V/D X2 days. Pt had syncopal episode in wheelchair while in lobby restroom waiting for triage. Immediately taken back to Trauma Room. EDP at bedside

## 2018-01-13 DIAGNOSIS — I428 Other cardiomyopathies: Secondary | ICD-10-CM

## 2018-01-13 DIAGNOSIS — R112 Nausea with vomiting, unspecified: Secondary | ICD-10-CM | POA: Diagnosis present

## 2018-01-13 DIAGNOSIS — J101 Influenza due to other identified influenza virus with other respiratory manifestations: Secondary | ICD-10-CM | POA: Diagnosis present

## 2018-01-13 DIAGNOSIS — N179 Acute kidney failure, unspecified: Secondary | ICD-10-CM | POA: Diagnosis present

## 2018-01-13 DIAGNOSIS — R55 Syncope and collapse: Secondary | ICD-10-CM | POA: Diagnosis present

## 2018-01-13 LAB — BASIC METABOLIC PANEL
Anion gap: 15 (ref 5–15)
BUN: 25 mg/dL — AB (ref 6–20)
CO2: 21 mmol/L — ABNORMAL LOW (ref 22–32)
CREATININE: 1.1 mg/dL — AB (ref 0.44–1.00)
Calcium: 7.7 mg/dL — ABNORMAL LOW (ref 8.9–10.3)
Chloride: 103 mmol/L (ref 98–111)
GFR calc Af Amer: >60 mL/min (ref >60)
GFR calc non Af Amer: 56 mL/min — ABNORMAL LOW (ref >60)
GLUCOSE: 103 mg/dL — AB (ref 70–99)
Potassium: 3.6 mmol/L (ref 3.5–5.1)
Sodium: 139 mmol/L (ref 135–145)

## 2018-01-13 LAB — URINALYSIS, ROUTINE W REFLEX MICROSCOPIC
BILIRUBIN URINE: NEGATIVE
GLUCOSE, UA: NEGATIVE mg/dL
HGB URINE DIPSTICK: NEGATIVE
KETONES UR: 5 mg/dL — AB
LEUKOCYTES UA: NEGATIVE
NITRITE: NEGATIVE
PROTEIN: 30 mg/dL — AB
Specific Gravity, Urine: 1.015 (ref 1.005–1.030)
pH: 5 (ref 5.0–8.0)

## 2018-01-13 LAB — GLUCOSE, CAPILLARY
Glucose-Capillary: 93 mg/dL (ref 70–99)
Glucose-Capillary: 135 mg/dL — ABNORMAL HIGH (ref 70–99)
Glucose-Capillary: 112 mg/dL — ABNORMAL HIGH (ref 70–99)
Glucose-Capillary: 162 mg/dL — ABNORMAL HIGH (ref 70–99)

## 2018-01-13 LAB — CBC
HCT: 38.7 % (ref 36.0–46.0)
Hemoglobin: 12.1 g/dL (ref 12.0–15.0)
MCH: 28.1 pg (ref 26.0–34.0)
MCHC: 31.3 g/dL (ref 30.0–36.0)
MCV: 90 fL (ref 80.0–100.0)
Platelets: 187 10*3/uL (ref 150–400)
RBC: 4.3 MIL/uL (ref 3.87–5.11)
RDW: 16.1 % — ABNORMAL HIGH (ref 11.5–15.5)
WBC: 6 10*3/uL (ref 4.0–10.5)
nRBC: 0 % (ref 0.0–0.2)

## 2018-01-13 LAB — TROPONIN I
Troponin I: 0.04 ng/mL (ref <0.03)
Troponin I: 0.05 ng/mL (ref <0.03)
Troponin I: 0.06 ng/mL (ref <0.03)

## 2018-01-13 LAB — I-STAT CG4 LACTIC ACID, ED: LACTIC ACID, VENOUS: 0.76 mmol/L (ref 0.5–1.9)

## 2018-01-13 LAB — CBG MONITORING, ED: Glucose-Capillary: 125 mg/dL — ABNORMAL HIGH (ref 70–99)

## 2018-01-13 MED ORDER — ACETAMINOPHEN 650 MG RE SUPP: 650.0000 mg | Freq: Four times a day (QID) | RECTAL | Status: DC | PRN

## 2018-01-13 MED ORDER — CARVEDILOL 12.5 MG PO TABS: 6.2500 mg | ORAL_TABLET | Freq: Two times a day (BID) | ORAL | Status: DC

## 2018-01-13 MED ORDER — ONDANSETRON HCL 4 MG PO TABS: 4.0000 mg | ORAL_TABLET | Freq: Four times a day (QID) | ORAL | Status: DC | PRN

## 2018-01-13 MED ORDER — ASPIRIN 81 MG PO CHEW
81.0000 mg | CHEWABLE_TABLET | Freq: Every day | ORAL | Status: DC
  Administered 2018-01-13 – 2018-01-14 (×2): 81 mg via ORAL
  Filled 2018-01-13 (×2): qty 1

## 2018-01-13 MED ORDER — ENOXAPARIN SODIUM 40 MG/0.4ML ~~LOC~~ SOLN
40.0000 mg | SUBCUTANEOUS | Status: DC
  Administered 2018-01-13 – 2018-01-14 (×2): 40 mg via SUBCUTANEOUS
  Filled 2018-01-13 (×3): qty 0.4

## 2018-01-13 MED ORDER — ONDANSETRON HCL 4 MG/2ML IJ SOLN: 4.0000 mg | Freq: Four times a day (QID) | INTRAMUSCULAR | Status: DC | PRN

## 2018-01-13 MED ORDER — INSULIN ASPART 100 UNIT/ML ~~LOC~~ SOLN
0.0000 [IU] | SUBCUTANEOUS | Status: DC
  Administered 2018-01-13: 2 [IU] via SUBCUTANEOUS
  Administered 2018-01-13: 1 [IU] via SUBCUTANEOUS

## 2018-01-13 MED ORDER — ACETAMINOPHEN 325 MG PO TABS: 650.0000 mg | ORAL_TABLET | Freq: Four times a day (QID) | ORAL | Status: DC | PRN

## 2018-01-13 MED ORDER — ALBUTEROL SULFATE (2.5 MG/3ML) 0.083% IN NEBU: 3.0000 mL | INHALATION_SOLUTION | Freq: Four times a day (QID) | RESPIRATORY_TRACT | Status: DC | PRN

## 2018-01-13 MED ORDER — OSELTAMIVIR PHOSPHATE 30 MG PO CAPS
30.0000 mg | ORAL_CAPSULE | Freq: Two times a day (BID) | ORAL | Status: DC
  Administered 2018-01-13 – 2018-01-14 (×3): 30 mg via ORAL
  Filled 2018-01-13 (×2): qty 1

## 2018-01-13 MED ORDER — IPRATROPIUM-ALBUTEROL 0.5-2.5 (3) MG/3ML IN SOLN: 3.0000 mL | Freq: Four times a day (QID) | RESPIRATORY_TRACT | Status: DC | PRN

## 2018-01-13 MED ORDER — BISMUTH SUBSALICYLATE 262 MG/15ML PO SUSP
30.0000 mL | ORAL | Status: DC | PRN
  Filled 2018-01-13: qty 236

## 2018-01-13 NOTE — Progress Notes (Signed)
Patient seen and examined this morning.  I reviewed admission note and plan.  Refer to admission note for more details.   PROGRESS NOTE    Felicia [name]  PFX:[id number] DOB: [birth year] DOA: 01/12/2018 PCP: Patient, No Pcp Per   Brief Narrative: 57 year old female with history of diabetes hypertension COPD nonischemic cardiomyopathy came to the ER with complaint of nausea vomiting fever for past 2 days and also with episode of syncope. In the ER influenza PCR positive for influenza B was given normal saline bolus, troponin was positive at 0.10 with no chest pain or EKG changes and was admitted for further management.  Assessment & Plan:   Influenza B : Continue on Tamiflu, droplet precaution hydration and symptomatic management.  Nausea & vomiting: Suspect secondary to #1.  Overall improving, continue IV hydration, symptomatic management.  Syncope : Suspecting secondary to low blood pressure in the setting of acute illness with nausea vomiting influenza.  Monitor orthostatic vitals.  AKI (acute kidney injury): Suspect secondary to influenza with assisted nausea vomiting and diarrhea.  Creatinine is improving nicely as below, continue on gentle IV hydration. Recent Labs  Lab 01/12/18 2121 01/12/18 2129 01/13/18 0752  BUN 24* 29* 25*  CREATININE 1.68* 1.50* 1.10*   Elevated troponin 0.1 : trop is downtrending.  Suspecting type II event due to renal failure along with  influenza nausea vomiting diarrhea with hypotension. Her recent cath in May did not have significant major coronary disease.  Patient denies any chest pain and EKG from 12/23- reviewed and is without overt signs of ischemia.  Continue her aspirin.  Recent Labs  Lab 01/13/18 0752 01/13/18 1347  TROPONINI 0.04* 0.05*    Diarrhea: Complaining of multiple episodes of watery stool.  Spitting due to #1.Check GI pathogen panel, again IV hydration and symptomatic management.  Nonischemic cardiomyopathy: Compensated,  watch for fluid overload.Recent EF 40 to 45%.  DM2 type 2: Blood sugar well controlled as below.  Sliding scale insulin. Last  A1c back in May was 8.6.  Recent Labs  Lab 01/12/18 2116 01/13/18 0453 01/13/18 0740 01/13/18 1135 01/13/18 1714  GLUCAP 184* 125* 93 135* 112*    Hypertension : BP soft, holding home meds for now.  Obesity with BMI 39.5 advised weight loss and healthy lifestyle.  Tobacco dependence : cessation advised.   DVT prophylaxis: Lovenox  Code Status: Full code Family Communication: No family at the bedside Disposition Plan: Home in next 24 hours if remains clinically stable   Consultants: None  Procedures: None  Antimicrobials: Tamiflu  Subjective: Complains of multiple loose episodes of loose stool. Denies chest pain shortness of breath nausea vomiting.  Objective: Vitals:   01/13/18 0516 01/13/18 0530 01/13/18 0601 01/13/18 0857  BP: 101/68 102/60 105/64 (!) 127/58  Pulse: 78 78 77 83  Resp: 16 16 18  (!) 22  Temp:   98.4 F (36.9 C) 98.8 F (37.1 C)  TempSrc:   Oral Oral  SpO2: 95% 97% 91% 91%  Weight:   88.7 kg   Height:        Intake/Output Summary (Last 24 hours) at 01/13/2018 1713 Last data filed at 01/13/2018 1300 Gross per 24 hour  Intake 2135.83 ml  Output 300 ml  Net 1835.83 ml   Filed Weights   01/12/18 2112 01/13/18 0601  Weight: 90.7 kg 88.7 kg   Weight change:   Body mass index is 39.51 kg/m.  Examination:  General exam: Appears calm and comfortable, obese in mild distress from diarrhea HEENT:PERRL,Oral  mucosa moist, Ear/Nose normal on gross exam Respiratory system: Bilateral equal air entry, normal vesicular breath sounds, no wheezes or crackles  Cardiovascular system: S1 & S2 heard,No JVD, murmurs.No pedal edema. Gastrointestinal system: Abdomen is  Soft, obese, non tender, non distended, BS +  Nervous System:Alert and oriented. No focal neurological deficits/moving extremities with a strength 5/ 5 in upper  and lower extremities. Extremities: No edema, no clubbing,distal peripheral pulses palpable. Skin: No rashes, lesions,no icterus MSK: Normal muscle bulk,tone ,power  Data Reviewed: I have personally reviewed following labs and imaging studies  CBC: Recent Labs  Lab 01/12/18 2121 01/12/18 2129 01/13/18 0752  WBC 8.1  --  6.0  NEUTROABS 5.0  --   --   HGB 14.3 16.0* 12.1  HCT 45.9 47.0* 38.7  MCV 90.2  --  90.0  PLT 225  --  187   Basic Metabolic Panel: Recent Labs  Lab 01/12/18 2121 01/12/18 2129 01/13/18 0752  NA 135 133* 139  K 4.1 3.9 3.6  CL 98 100 103  CO2 21*  --  21*  GLUCOSE 195* 194* 103*  BUN 24* 29* 25*  CREATININE 1.68* 1.50* 1.10*  CALCIUM 8.7*  --  7.7*   GFR: Estimated Creatinine Clearance: 56 mL/min (A) (by C-G formula based on SCr of 1.1 mg/dL (H)). Liver Function Tests: Recent Labs  Lab 01/12/18 2121  AST 45*  ALT 32  ALKPHOS 94  BILITOT 0.7  PROT 7.2  ALBUMIN 3.6   No results for input(s): LIPASE, AMYLASE in the last 168 hours. No results for input(s): AMMONIA in the last 168 hours. Coagulation Profile: No results for input(s): INR, PROTIME in the last 168 hours. Cardiac Enzymes: Recent Labs  Lab 01/13/18 0752 01/13/18 1347  TROPONINI 0.04* 0.05*   BNP (last 3 results) No results for input(s): PROBNP in the last 8760 hours. HbA1C: No results for input(s): HGBA1C in the last 72 hours. CBG: Recent Labs  Lab 01/12/18 2116 01/13/18 0453 01/13/18 0740 01/13/18 1135  GLUCAP 184* 125* 93 135*   Lipid Profile: No results for input(s): CHOL, HDL, LDLCALC, TRIG, CHOLHDL, LDLDIRECT in the last 72 hours. Thyroid Function Tests: No results for input(s): TSH, T4TOTAL, FREET4, T3FREE, THYROIDAB in the last 72 hours. Anemia Panel: No results for input(s): VITAMINB12, FOLATE, FERRITIN, TIBC, IRON, RETICCTPCT in the last 72 hours. Sepsis Labs: Recent Labs  Lab 01/12/18 2130 01/13/18 0212  LATICACIDVEN 1.28 0.76    Recent Results  (from the past 240 hour(s))  Culture, blood (routine x 2)     Status: None (Preliminary result)   Collection Time: 01/12/18  9:25 PM  Result Value Ref Range Status   Specimen Description BLOOD LEFT FOREARM  Final   Special Requests   Final    BOTTLES DRAWN AEROBIC AND ANAEROBIC Blood Culture adequate volume   Culture   Final    NO GROWTH < 24 HOURS Performed at [facility], [address], [place], [place] [address]    Report Status PENDING  Incomplete  Culture, blood (routine x 2)     Status: None (Preliminary result)   Collection Time: 01/13/18  2:05 AM  Result Value Ref Range Status   Specimen Description BLOOD LEFT HAND  Final   Special Requests   Final    BOTTLES DRAWN AEROBIC AND ANAEROBIC Blood Culture adequate volume   Culture   Final    NO GROWTH < 12 HOURS Performed at [facility], [address] [place], [place]  [address]    Report Status PENDING  Incomplete      Radiology Studies: Ct Head Wo Contrast  Result Date: 01/12/2018 CLINICAL DATA:  Dizziness and fever. Syncopal episode in emergency department. History of hypertension and diabetes. EXAM: CT HEAD WITHOUT CONTRAST CT CERVICAL SPINE WITHOUT CONTRAST TECHNIQUE: Multidetector CT imaging of the head and cervical spine was performed following the standard protocol without intravenous contrast. Multiplanar CT image reconstructions of the cervical spine were also generated. COMPARISON:  None. CT neck September 24, 2012 FINDINGS: CT HEAD FINDINGS BRAIN: No intraparenchymal hemorrhage, mass effect nor midline shift. The ventricles and sulci are normal for age. Patchy supratentorial white matter hypodensities. Mildly heterogeneous basal ganglia with chronic small vessel ischemic changes. No acute large vascular territory infarcts. No abnormal extra-axial fluid collections. Basal cisterns are patent. VASCULAR: Moderate calcific atherosclerosis of the carotid siphons. SKULL: No skull fracture. No significant scalp  soft tissue swelling. SINUSES/ORBITS: Trace paranasal sinus mucosal thickening. Mastoid air cells are well aerated.The included ocular globes and orbital contents are non-suspicious. OTHER: None. CT CERVICAL SPINE FINDINGS- Large body habitus results in overall noisy image quality. ALIGNMENT: Straightened lordosis.  Vertebral bodies in alignment. 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 Cervical vertebral bodies and posterior elements are otherwise intact. Intervertebral disc heights preserved. No destructive bony lesions. C1-2 articulation maintained. SOFT TISSUES AND SPINAL CANAL: Nonacute. Mild calcific atherosclerosis carotid bifurcations. DISC LEVELS: No significant osseous canal stenosis or neural foraminal narrowing. UPPER CHEST: Lung apices are clear. OTHER: None. IMPRESSION: CT HEAD: 1. No acute intracranial process. 2. Mild chronic small vessel ischemic changes. 3. Moderate intracranial atherosclerosis. CT CERVICAL SPINE: 1. No fracture or malalignment. No advanced degenerative change for age. Electronically Signed   By: Awilda Metro M.D.   On: 01/12/2018 23:17   Ct Cervical Spine Wo Contrast  Result Date: 01/12/2018 CLINICAL DATA:  Dizziness and fever. Syncopal episode in emergency department. History of hypertension and diabetes. EXAM: CT HEAD WITHOUT CONTRAST CT CERVICAL SPINE WITHOUT CONTRAST TECHNIQUE: Multidetector CT imaging of the head and cervical spine was performed following the standard protocol without intravenous contrast. Multiplanar CT image reconstructions of the cervical spine were also generated. COMPARISON:  None. CT neck September 24, 2012 FINDINGS: CT HEAD FINDINGS BRAIN: No intraparenchymal hemorrhage, mass effect nor midline shift. The ventricles and sulci are normal for age. Patchy supratentorial white matter hypodensities. Mildly heterogeneous basal ganglia with chronic small vessel ischemic changes. No acute large vascular territory infarcts. No  abnormal extra-axial fluid collections. Basal cisterns are patent. VASCULAR: Moderate calcific atherosclerosis of the carotid siphons. SKULL: No skull fracture. No significant scalp soft tissue swelling. SINUSES/ORBITS: Trace paranasal sinus mucosal thickening. Mastoid air cells are well aerated.The included ocular globes and orbital contents are non-suspicious. OTHER: None. CT CERVICAL SPINE FINDINGS- Large body habitus results in overall noisy image quality. ALIGNMENT: Straightened lordosis.  Vertebral bodies in alignment. 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 Cervical vertebral bodies and posterior elements are otherwise intact. Intervertebral disc heights preserved. No destructive bony lesions. C1-2 articulation maintained. SOFT TISSUES AND SPINAL CANAL: Nonacute. Mild calcific atherosclerosis carotid bifurcations. DISC LEVELS: No significant osseous canal stenosis or neural foraminal narrowing. UPPER CHEST: Lung apices are clear. OTHER: None. IMPRESSION: CT HEAD: 1. No acute intracranial process. 2. Mild chronic small vessel ischemic changes. 3. Moderate intracranial atherosclerosis. CT CERVICAL SPINE: 1. No fracture or malalignment. No advanced degenerative change for age. Electronically Signed   By: Awilda Metro M.D.   On: 01/12/2018  23:17   Dg Chest Port 1 View  Result Date: 01/12/2018 CLINICAL DATA:  57 year old female with respiratory distress. EXAM: PORTABLE CHEST 1 VIEW COMPARISON:  CTA chest 05/27/2017 and earlier. FINDINGS: Portable AP semi upright view at 2121 hours. Stable mild cardiomegaly. Other mediastinal contours are within normal limits. Visualized tracheal air column is within normal limits. Allowing for portable technique the lungs are clear. No pneumothorax or pleural effusion. Paucity bowel gas in the upper abdomen. No acute osseous abnormality identified. IMPRESSION: No acute cardiopulmonary abnormality. Electronically Signed   By: Odessa FlemingH  Hall M.D.   On:  01/12/2018 21:32     Scheduled Meds: . aspirin  81 mg Oral Daily  . enoxaparin (LOVENOX) injection  40 mg Subcutaneous Q24H  . insulin aspart  0-9 Units Subcutaneous Q4H  . oseltamivir  30 mg Oral BID   Continuous Infusions: . sodium chloride 125 mL/hr at 01/13/18 1349     LOS: 0 days   Time spent: More than 50% of that time was spent in counseling and/or coordination of care.  Lanae Boastamesh Eino Whitner, MD [facility] Pager [phone]  If 7PM-7AM, please contact night-coverage [email or web address] Password [email or web address] 01/13/2018, 5:13 PM

## 2018-01-13 NOTE — H&P (Signed)
History and Physical    Felicia [name] EHO:[id number] DOB: [birth year] DOA: 01/12/2018  PCP: Patient, No Pcp Per  Patient coming from: Home  I have personally briefly reviewed patient's old medical records in [facility]  Chief Complaint: N/V, syncope  HPI: Felicia [name] is a 57 y.o. female with medical history significant of NICM, DM2, HTN, COPD.  Patient presents to the ED with c/o N/V, syncope.  Has been having N/V, fever for past 2 days, onset Sat.  Laying in bed due to not feeling well.  Today had syncopal episode.  No CP.  Poor PO intake, not really able to keep much down.  Seeing dark flashes when ever she attempts to sit up or stand.   ED Course: Influenza PCR positive for Influenza B.  Given 1L NS bolus, zofran.  On further history taking, her daughter recently had sore throat and fever on thurs or fri last week it seems.   Review of Systems: As per HPI otherwise 10 point review of systems negative.   Past Medical History:  Diagnosis Date  . Cardiomyopathy   . CHF (congestive heart failure) (HCC)    minamal  . COPD (chronic obstructive pulmonary disease) (HCC)   . DDD (degenerative disc disease), lumbar   . Diabetes mellitus   . Hypertension   . Non compliance w medication regimen   . Normal echocardiogram    LVEF 25-30% 03/27/2010    Past Surgical History:  Procedure Laterality Date  . CARDIAC CATHETERIZATION    . RIGHT/LEFT HEART CATH AND CORONARY ANGIOGRAPHY N/A 05/29/2017   Procedure: RIGHT/LEFT HEART CATH AND CORONARY ANGIOGRAPHY;  Surgeon: Dolores Patty, MD;  Location: [facility] INVAS[facility];  Service: Cardiovascular;  Laterality: N/A;  . TUBAL LIGATION       reports that she has been smoking cigarettes. She has been smoking about 0.50 packs per day. She has never used smokeless tobacco. She reports that she does not drink alcohol or use drugs.  Allergies  Allergen Reactions  . Banana Itching  . Latex Itching    Family History    Problem Relation Age of Onset  . Stroke Mother   . Lung cancer Father      Prior to Admission medications   Medication Sig Start Date End Date Taking? Authorizing Provider  acetaminophen (TYLENOL) 500 MG tablet Take 1,500 mg by mouth daily as needed for mild pain or headache.   Yes [provider]  albuterol (PROVENTIL HFA;VENTOLIN HFA) 108 (90 Base) MCG/ACT inhaler Inhale 2 puffs into the lungs every 6 (six) hours as needed for wheezing or shortness of breath. 05/30/17  Yes Burnadette Pop, MD  aspirin 81 MG chewable tablet Chew 81 mg by mouth daily. 04/16/16  Yes [provider]  bismuth subsalicylate (PEPTO BISMOL) 262 MG/15ML suspension Take 30 mLs by mouth every 4 (four) hours as needed for indigestion.    Yes [provider]  carvedilol (COREG) 6.25 MG tablet Take 1 tablet (6.25 mg total) by mouth 2 (two) times daily with a meal. 09/02/17  Yes Bensimhon, Bevelyn Buckles, MD  diphenhydrAMINE (BENADRYL) 25 MG tablet Take 25 mg by mouth every 6 (six) hours as needed for allergies.    Yes [provider]  diphenhydramine-acetaminophen (TYLENOL PM EXTRA STRENGTH) 25-500 MG TABS tablet Take 2 tablets by mouth at bedtime as needed (sleep).    Yes [provider]  hydrALAZINE (APRESOLINE) 25 MG tablet Take 1 tablet (25 mg total) by mouth 3 (three) times  daily. 09/11/17 01/13/18 Yes Bensimhon, Bevelyn Buckles, MD  ibuprofen (ADVIL,MOTRIN) 200 MG tablet Take 800 mg by mouth every 8 (eight) hours as needed for moderate pain.    Yes [provider]  insulin NPH-regular Human (NOVOLIN 70/30) (70-30) 100 UNIT/ML injection Inject 40 Units into the skin 2 (two) times daily. 05/30/17 09/03/18 Yes Adhikari, Willia Craze, MD  ipratropium-albuterol (DUONEB) 0.5-2.5 (3) MG/3ML SOLN Take 3 mLs by nebulization every 6 (six) hours as needed. Patient taking differently: Take 3 mLs by nebulization every 6 (six) hours as needed (SOB).  05/30/17 09/03/18 Yes Burnadette Pop, MD  isosorbide  mononitrate (IMDUR) 30 MG 24 hr tablet Take 1 tablet (30 mg total) by mouth daily. 09/11/17 01/13/18 Yes Bensimhon, Bevelyn Buckles, MD  Multiple Vitamin (MULTIVITAMIN WITH MINERALS) TABS tablet Take 1 tablet by mouth once a week.    Yes [provider]  sacubitril-valsartan (ENTRESTO) 97-103 MG Take 1 tablet by mouth 2 (two) times daily. 09/03/17  Yes Bensimhon, Bevelyn Buckles, MD  spironolactone (ALDACTONE) 25 MG tablet TAKE 1 TABLET BY MOUTH ONCE DAILY Patient taking differently: Take 25 mg by mouth daily.  12/05/17  Yes Bensimhon, Bevelyn Buckles, MD    Physical Exam: Vitals:   01/13/18 0100 01/13/18 0200 01/13/18 0230 01/13/18 0330  BP: (!) 106/50 96/64 110/61 90/64  Pulse: 77 78 79 81  Resp: 17 13 (!) 28 16  Temp:      TempSrc:      SpO2: 94% 98% 97% 96%  Weight:      Height:        Constitutional: NAD, calm, comfortable Eyes: PERRL, lids and conjunctivae normal ENMT: Mucous membranes are moist. Posterior pharynx clear of any exudate or lesions.Normal dentition.  Neck: normal, supple, no masses, no thyromegaly Respiratory: clear to auscultation bilaterally, no wheezing, no crackles. Normal respiratory effort. No accessory muscle use.  Cardiovascular: Regular rate and rhythm, no murmurs / rubs / gallops. No extremity edema. 2+ pedal pulses. No carotid bruits.  Abdomen: no tenderness, no masses palpated. No hepatosplenomegaly. Bowel sounds positive.  Musculoskeletal: no clubbing / cyanosis. No joint deformity upper and lower extremities. Good ROM, no contractures. Normal muscle tone.  Skin: no rashes, lesions, ulcers. No induration Neurologic: CN 2-12 grossly intact. Sensation intact, DTR normal. Strength 5/5 in all 4.  Psychiatric: Normal judgment and insight. Alert and oriented x 3. Normal mood.    Labs on Admission: I have personally reviewed following labs and imaging studies  CBC: Recent Labs  Lab 01/12/18 2121 01/12/18 2129  WBC 8.1  --   NEUTROABS 5.0  --   HGB 14.3 16.0*    HCT 45.9 47.0*  MCV 90.2  --   PLT 225  --    Basic Metabolic Panel: Recent Labs  Lab 01/12/18 2121 01/12/18 2129  NA 135 133*  K 4.1 3.9  CL 98 100  CO2 21*  --   GLUCOSE 195* 194*  BUN 24* 29*  CREATININE 1.68* 1.50*  CALCIUM 8.7*  --    GFR: Estimated Creatinine Clearance: 41.6 mL/min (A) (by C-G formula based on SCr of 1.5 mg/dL (H)). Liver Function Tests: Recent Labs  Lab 01/12/18 2121  AST 45*  ALT 32  ALKPHOS 94  BILITOT 0.7  PROT 7.2  ALBUMIN 3.6   No results for input(s): LIPASE, AMYLASE in the last 168 hours. No results for input(s): AMMONIA in the last 168 hours. Coagulation Profile: No results for input(s): INR, PROTIME in the last 168 hours. Cardiac Enzymes: No results  for input(s): CKTOTAL, CKMB, CKMBINDEX, TROPONINI in the last 168 hours. BNP (last 3 results) No results for input(s): PROBNP in the last 8760 hours. HbA1C: No results for input(s): HGBA1C in the last 72 hours. CBG: Recent Labs  Lab 01/12/18 2116  GLUCAP 184*   Lipid Profile: No results for input(s): CHOL, HDL, LDLCALC, TRIG, CHOLHDL, LDLDIRECT in the last 72 hours. Thyroid Function Tests: No results for input(s): TSH, T4TOTAL, FREET4, T3FREE, THYROIDAB in the last 72 hours. Anemia Panel: No results for input(s): VITAMINB12, FOLATE, FERRITIN, TIBC, IRON, RETICCTPCT in the last 72 hours. Urine analysis:    Component Value Date/Time   COLORURINE YELLOW 01/13/2018 0345   APPEARANCEUR CLOUDY (A) 01/13/2018 0345   LABSPEC 1.015 01/13/2018 0345   PHURINE 5.0 01/13/2018 0345   GLUCOSEU NEGATIVE 01/13/2018 0345   HGBUR NEGATIVE 01/13/2018 0345   BILIRUBINUR NEGATIVE 01/13/2018 0345   KETONESUR 5 (A) 01/13/2018 0345   PROTEINUR 30 (A) 01/13/2018 0345   UROBILINOGEN 0.2 07/06/2014 1342   NITRITE NEGATIVE 01/13/2018 0345   LEUKOCYTESUR NEGATIVE 01/13/2018 0345    Radiological Exams on Admission: Ct Head Wo Contrast  Result Date: 01/12/2018 CLINICAL DATA:  Dizziness and  fever. Syncopal episode in emergency department. History of hypertension and diabetes. EXAM: CT HEAD WITHOUT CONTRAST CT CERVICAL SPINE WITHOUT CONTRAST TECHNIQUE: Multidetector CT imaging of the head and cervical spine was performed following the standard protocol without intravenous contrast. Multiplanar CT image reconstructions of the cervical spine were also generated. COMPARISON:  None. CT neck September 24, 2012 FINDINGS: CT HEAD FINDINGS BRAIN: No intraparenchymal hemorrhage, mass effect nor midline shift. The ventricles and sulci are normal for age. Patchy supratentorial white matter hypodensities. Mildly heterogeneous basal ganglia with chronic small vessel ischemic changes. No acute large vascular territory infarcts. No abnormal extra-axial fluid collections. Basal cisterns are patent. VASCULAR: Moderate calcific atherosclerosis of the carotid siphons. SKULL: No skull fracture. No significant scalp soft tissue swelling. SINUSES/ORBITS: Trace paranasal sinus mucosal thickening. Mastoid air cells are well aerated.The included ocular globes and orbital contents are non-suspicious. OTHER: None. CT CERVICAL SPINE FINDINGS- Large body habitus results in overall noisy image quality. ALIGNMENT: Straightened lordosis.  Vertebral bodies in alignment. 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 Cervical vertebral bodies and posterior elements are otherwise intact. Intervertebral disc heights preserved. No destructive bony lesions. C1-2 articulation maintained. SOFT TISSUES AND SPINAL CANAL: Nonacute. Mild calcific atherosclerosis carotid bifurcations. DISC LEVELS: No significant osseous canal stenosis or neural foraminal narrowing. UPPER CHEST: Lung apices are clear. OTHER: None. IMPRESSION: CT HEAD: 1. No acute intracranial process. 2. Mild chronic small vessel ischemic changes. 3. Moderate intracranial atherosclerosis. CT CERVICAL SPINE: 1. No fracture or malalignment. No advanced degenerative  change for age. Electronically Signed   By: Awilda Metro M.D.   On: 01/12/2018 23:17   Ct Cervical Spine Wo Contrast  Result Date: 01/12/2018 CLINICAL DATA:  Dizziness and fever. Syncopal episode in emergency department. History of hypertension and diabetes. EXAM: CT HEAD WITHOUT CONTRAST CT CERVICAL SPINE WITHOUT CONTRAST TECHNIQUE: Multidetector CT imaging of the head and cervical spine was performed following the standard protocol without intravenous contrast. Multiplanar CT image reconstructions of the cervical spine were also generated. COMPARISON:  None. CT neck September 24, 2012 FINDINGS: CT HEAD FINDINGS BRAIN: No intraparenchymal hemorrhage, mass effect nor midline shift. The ventricles and sulci are normal for age. Patchy supratentorial white matter hypodensities. Mildly heterogeneous basal ganglia with chronic small vessel ischemic changes. No acute large vascular territory infarcts. No abnormal  extra-axial fluid collections. Basal cisterns are patent. VASCULAR: Moderate calcific atherosclerosis of the carotid siphons. SKULL: No skull fracture. No significant scalp soft tissue swelling. SINUSES/ORBITS: Trace paranasal sinus mucosal thickening. Mastoid air cells are well aerated.The included ocular globes and orbital contents are non-suspicious. OTHER: None. CT CERVICAL SPINE FINDINGS- Large body habitus results in overall noisy image quality. ALIGNMENT: Straightened lordosis.  Vertebral bodies in alignment. 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 Cervical vertebral bodies and posterior elements are otherwise intact. Intervertebral disc heights preserved. No destructive bony lesions. C1-2 articulation maintained. SOFT TISSUES AND SPINAL CANAL: Nonacute. Mild calcific atherosclerosis carotid bifurcations. DISC LEVELS: No significant osseous canal stenosis or neural foraminal narrowing. UPPER CHEST: Lung apices are clear. OTHER: None. IMPRESSION: CT HEAD: 1. No acute  intracranial process. 2. Mild chronic small vessel ischemic changes. 3. Moderate intracranial atherosclerosis. CT CERVICAL SPINE: 1. No fracture or malalignment. No advanced degenerative change for age. Electronically Signed   By: Awilda Metroourtnay  Bloomer M.D.   On: 01/12/2018 23:17   Dg Chest Port 1 View  Result Date: 01/12/2018 CLINICAL DATA:  57 year old female with respiratory distress. EXAM: PORTABLE CHEST 1 VIEW COMPARISON:  CTA chest 05/27/2017 and earlier. FINDINGS: Portable AP semi upright view at 2121 hours. Stable mild cardiomegaly. Other mediastinal contours are within normal limits. Visualized tracheal air column is within normal limits. Allowing for portable technique the lungs are clear. No pneumothorax or pleural effusion. Paucity bowel gas in the upper abdomen. No acute osseous abnormality identified. IMPRESSION: No acute cardiopulmonary abnormality. Electronically Signed   By: Odessa FlemingH  Hall M.D.   On: 01/12/2018 21:32    EKG: Independently reviewed.  Assessment/Plan Principal Problem:   Influenza B Active Problems:   Nonischemic cardiomyopathy (HCC)   DM2 (diabetes mellitus, type 2) (HCC)   Hypertension   Syncope   AKI (acute kidney injury) (HCC)   Nausea & vomiting    1. Influenza B - 1. Will start Tamiflu, though fairly late in course of disease 2. N/V - secondary to #1 above 1. IVF 2. zofran 3. AKI - likely pre-renal due to Flu and N/V 1. IVF: NS at 125 cc/hr 2. Holding home BP meds and lasix 3. Repeat BMP in AM 4. Intake and output 4. HTN - BP running a bit on the soft side in ED 1. Holding home BP medS 5. Syncope - 1. likely due to dehydration, soft BPs as above 2. Tele monitor 3. Check serial trops given Trop of 0.10 in ED 4. But no CP, doubt ACS 5. And reassuring heart cath in May this year that wasn't super impressive: 40% mid CX and 20% prox LAD were only lesions 6. NICM - 1. watch for fluid overload with re-hydration and holding diuretics 2. EF 40-45% 7. DM2  - 1. SSI Q4H mod scale  DVT prophylaxis: Lovenox Code Status: Full Family Communication: No family in room Disposition Plan: Home after admit Consults called: None Admission status: Place in obs    , Heywood IlesJARED M. DO [facility] Pager [phone] Only works nights!  If 7AM-7PM, please contact the primary day team physician taking care of patient  [email or web address] Password [email or web address]  01/13/2018, 4:53 AM

## 2018-01-13 NOTE — Progress Notes (Signed)
New Admission Note:  Arrival Method: Via stretcher from ED Mental Orientation: Alert & Oriented x4 Telemetry: CCMD verified Assessment: Completed Skin: Refer to flowsheet IV: Right and Left FA Pain: 0/10 Safety Measures: Safety Fall Prevention Plan discussed with patient. Admission: Completed [facility] [place] Orientation: Patient has been orientated to the room, unit and the staff.  Orders have been reviewed and are being implemented. Will continue to monitor the patient. Call light has been placed within reach and bed alarm has been activated.   Aram Candela, RN  Phone Number: [phone]

## 2018-01-13 NOTE — ED Notes (Signed)
I found pt vomiting  She vomited approx 300 ml liquid  And felt a little better

## 2018-01-13 NOTE — ED Notes (Signed)
Pt asleep.

## 2018-01-13 NOTE — ED Notes (Signed)
CBG checked per request Dr. Julian Reil

## 2018-01-14 DIAGNOSIS — E11311 Type 2 diabetes mellitus with unspecified diabetic retinopathy with macular edema: Secondary | ICD-10-CM

## 2018-01-14 DIAGNOSIS — Z794 Long term (current) use of insulin: Secondary | ICD-10-CM

## 2018-01-14 DIAGNOSIS — N179 Acute kidney failure, unspecified: Secondary | ICD-10-CM

## 2018-01-14 DIAGNOSIS — E860 Dehydration: Secondary | ICD-10-CM

## 2018-01-14 DIAGNOSIS — I5022 Chronic systolic (congestive) heart failure: Secondary | ICD-10-CM

## 2018-01-14 DIAGNOSIS — J101 Influenza due to other identified influenza virus with other respiratory manifestations: Secondary | ICD-10-CM

## 2018-01-14 DIAGNOSIS — R7989 Other specified abnormal findings of blood chemistry: Secondary | ICD-10-CM

## 2018-01-14 DIAGNOSIS — I10 Essential (primary) hypertension: Secondary | ICD-10-CM

## 2018-01-14 LAB — GLUCOSE, CAPILLARY
Glucose-Capillary: 110 mg/dL — ABNORMAL HIGH (ref 70–99)
Glucose-Capillary: 81 mg/dL (ref 70–99)
Glucose-Capillary: 82 mg/dL (ref 70–99)
Glucose-Capillary: 85 mg/dL (ref 70–99)

## 2018-01-14 LAB — CBC
HCT: 40.6 % (ref 36.0–46.0)
Hemoglobin: 12.6 g/dL (ref 12.0–15.0)
MCH: 28.1 pg (ref 26.0–34.0)
MCHC: 31 g/dL (ref 30.0–36.0)
MCV: 90.6 fL (ref 80.0–100.0)
Platelets: 184 10*3/uL (ref 150–400)
RBC: 4.48 MIL/uL (ref 3.87–5.11)
RDW: 15.9 % — ABNORMAL HIGH (ref 11.5–15.5)
WBC: 4.7 10*3/uL (ref 4.0–10.5)
nRBC: 0 % (ref 0.0–0.2)

## 2018-01-14 LAB — BASIC METABOLIC PANEL
Anion gap: 10 (ref 5–15)
BUN: 8 mg/dL (ref 6–20)
CALCIUM: 7.9 mg/dL — AB (ref 8.9–10.3)
CO2: 23 mmol/L (ref 22–32)
Chloride: 105 mmol/L (ref 98–111)
Creatinine, Ser: 0.73 mg/dL (ref 0.44–1.00)
GFR calc Af Amer: >60 mL/min (ref >60)
Glucose, Bld: 97 mg/dL (ref 70–99)
Potassium: 3.3 mmol/L — ABNORMAL LOW (ref 3.5–5.1)
Sodium: 138 mmol/L (ref 135–145)

## 2018-01-14 MED ORDER — OSELTAMIVIR PHOSPHATE 30 MG PO CAPS: 30.0000 mg | ORAL_CAPSULE | Freq: Two times a day (BID) | ORAL | 0 refills | Status: DC

## 2018-01-14 MED ORDER — GUAIFENESIN ER 600 MG PO TB12: 600.0000 mg | ORAL_TABLET | Freq: Two times a day (BID) | ORAL | 0 refills | Status: DC

## 2018-01-14 MED ORDER — POTASSIUM CHLORIDE CRYS ER 20 MEQ PO TBCR
40.0000 meq | EXTENDED_RELEASE_TABLET | Freq: Once | ORAL | Status: AC
  Administered 2018-01-14: 40 meq via ORAL
  Filled 2018-01-14: qty 2

## 2018-01-14 MED ORDER — GUAIFENESIN ER 600 MG PO TB12
600.0000 mg | ORAL_TABLET | Freq: Two times a day (BID) | ORAL | Status: DC
  Administered 2018-01-14: 600 mg via ORAL
  Filled 2018-01-14: qty 1

## 2018-01-14 NOTE — Discharge Summary (Signed)
Physician Discharge Summary  Felicia [name] TIR:[id number] DOB: [birth year] DOA: 01/12/2018  PCP: Patient, No Pcp Per  Admit date: 01/12/2018 Discharge date: 01/14/2018  Admitted From: home  Disposition:  home      Discharge Condition:  stable   CODE STATUS:  Full coded   Consultations:  none    Discharge Diagnoses:  Principal Problem:   Influenza B Active Problems:   Nausea & vomiting   Syncope   AKI (acute kidney injury)     Nonischemic cardiomyopathy (HCC)   DM2 (diabetes mellitus, type 2) (HCC)   Hypertension   COPD (chronic obstructive pulmonary disease) (HCC)   Chronic systolic heart failure (HCC)   Brief Summary: 57 year old female with history of diabetes hypertension COPD nonischemic cardiomyopathy came to the ER with complaint of nausea vomiting fever for past 2 days and also with episode of syncope. In the ER influenza PCR positive for influenza B was given normal saline bolus, troponin was positive at 0.10 with no chest pain or EKG changes and was admitted for further management.  I am evaluating her for the first time today. She appears to be ready to d/c home.   Hospital Course:  Influenza B with nausea, vomiting  - GI symptoms resolved, eating solid food - able to ambulate in hall without O2 needs  Syncope and AKI - suspected to be due to hypotension and volume depletion  Mildly elevated troponin - flat trend- no need for further work up  Tobacco abuse  - advised to stop smoking  HFrEF - EF 40 -45% - compensated-   DM2 - cont 70/30 insulin  Discharge Exam: Vitals:   01/14/18 1015 01/14/18 1020  BP:    Pulse:    Resp:    Temp:    SpO2: 100% 95%   Vitals:   01/14/18 0435 01/14/18 0734 01/14/18 1015 01/14/18 1020  BP: 135/63 (!) 145/79    Pulse: 93 98    Resp: 19 19    Temp: 98.4 F (36.9 C) 98.5 F (36.9 C)    TempSrc:  Oral    SpO2: 92% 95% 100% 95%  Weight:      Height:        General: Pt is alert, awake, not in acute  distress Cardiovascular: RRR, S1/S2 +, no rubs, no gallops Respiratory: mild chest congestion/ rhonchi noted. No wheezing. RR normal.  Abdominal: Soft, NT, ND, bowel sounds + Extremities: no edema, no cyanosis   Discharge Instructions  Discharge Instructions    Diet - low sodium heart healthy   Complete by:  As directed    Diet Carb Modified   Complete by:  As directed    Increase activity slowly   Complete by:  As directed      Allergies as of 01/14/2018      Reactions   Banana Itching   Latex Itching      Medication List    TAKE these medications   acetaminophen 500 MG tablet Commonly known as:  TYLENOL Take 1,500 mg by mouth daily as needed for mild pain or headache.   albuterol 108 (90 Base) MCG/ACT inhaler Commonly known as:  PROVENTIL HFA;VENTOLIN HFA Inhale 2 puffs into the lungs every 6 (six) hours as needed for wheezing or shortness of breath.   aspirin 81 MG chewable tablet Chew 81 mg by mouth daily.   bismuth subsalicylate 262 MG/15ML suspension Commonly known as:  PEPTO BISMOL Take 30 mLs by mouth every 4 (four) hours as needed for indigestion.  carvedilol 6.25 MG tablet Commonly known as:  COREG Take 1 tablet (6.25 mg total) by mouth 2 (two) times daily with a meal.   diphenhydrAMINE 25 MG tablet Commonly known as:  BENADRYL Take 25 mg by mouth every 6 (six) hours as needed for allergies.   guaiFENesin 600 MG 12 hr tablet Commonly known as:  MUCINEX Take 1 tablet (600 mg total) by mouth 2 (two) times daily.   hydrALAZINE 25 MG tablet Commonly known as:  APRESOLINE Take 1 tablet (25 mg total) by mouth 3 (three) times daily.   ibuprofen 200 MG tablet Commonly known as:  ADVIL,MOTRIN Take 800 mg by mouth every 8 (eight) hours as needed for moderate pain.   insulin NPH-regular Human (70-30) 100 UNIT/ML injection Inject 40 Units into the skin 2 (two) times daily.   ipratropium-albuterol 0.5-2.5 (3) MG/3ML Soln Commonly known as:  DUONEB Take  3 mLs by nebulization every 6 (six) hours as needed. What changed:  reasons to take this   isosorbide mononitrate 30 MG 24 hr tablet Commonly known as:  IMDUR Take 1 tablet (30 mg total) by mouth daily.   multivitamin with minerals Tabs tablet Take 1 tablet by mouth once a week.   oseltamivir 30 MG capsule Commonly known as:  TAMIFLU Take 1 capsule (30 mg total) by mouth 2 (two) times daily.   sacubitril-valsartan 97-103 MG Commonly known as:  ENTRESTO Take 1 tablet by mouth 2 (two) times daily.   spironolactone 25 MG tablet Commonly known as:  ALDACTONE TAKE 1 TABLET BY MOUTH ONCE DAILY   TYLENOL PM EXTRA STRENGTH 25-500 MG Tabs tablet Generic drug:  diphenhydramine-acetaminophen Take 2 tablets by mouth at bedtime as needed (sleep).       Allergies  Allergen Reactions  . Banana Itching  . Latex Itching     Procedures/Studies:    Ct Head Wo Contrast  Result Date: 01/12/2018 CLINICAL DATA:  Dizziness and fever. Syncopal episode in emergency department. History of hypertension and diabetes. EXAM: CT HEAD WITHOUT CONTRAST CT CERVICAL SPINE WITHOUT CONTRAST TECHNIQUE: Multidetector CT imaging of the head and cervical spine was performed following the standard protocol without intravenous contrast. Multiplanar CT image reconstructions of the cervical spine were also generated. COMPARISON:  None. CT neck September 24, 2012 FINDINGS: CT HEAD FINDINGS BRAIN: No intraparenchymal hemorrhage, mass effect nor midline shift. The ventricles and sulci are normal for age. Patchy supratentorial white matter hypodensities. Mildly heterogeneous basal ganglia with chronic small vessel ischemic changes. No acute large vascular territory infarcts. No abnormal extra-axial fluid collections. Basal cisterns are patent. VASCULAR: Moderate calcific atherosclerosis of the carotid siphons. SKULL: No skull fracture. No significant scalp soft tissue swelling. SINUSES/ORBITS: Trace paranasal sinus mucosal  thickening. Mastoid air cells are well aerated.The included ocular globes and orbital contents are non-suspicious. OTHER: None. CT CERVICAL SPINE FINDINGS- Large body habitus results in overall noisy image quality. ALIGNMENT: Straightened lordosis.  Vertebral bodies in alignment. 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 Cervical vertebral bodies and posterior elements are otherwise intact. Intervertebral disc heights preserved. No destructive bony lesions. C1-2 articulation maintained. SOFT TISSUES AND SPINAL CANAL: Nonacute. Mild calcific atherosclerosis carotid bifurcations. DISC LEVELS: No significant osseous canal stenosis or neural foraminal narrowing. UPPER CHEST: Lung apices are clear. OTHER: None. IMPRESSION: CT HEAD: 1. No acute intracranial process. 2. Mild chronic small vessel ischemic changes. 3. Moderate intracranial atherosclerosis. CT CERVICAL SPINE: 1. No fracture or malalignment. No advanced degenerative change for age. Electronically Signed  By: Awilda Metro M.D.   On: 01/12/2018 23:17   Ct Cervical Spine Wo Contrast  Result Date: 01/12/2018 CLINICAL DATA:  Dizziness and fever. Syncopal episode in emergency department. History of hypertension and diabetes. EXAM: CT HEAD WITHOUT CONTRAST CT CERVICAL SPINE WITHOUT CONTRAST TECHNIQUE: Multidetector CT imaging of the head and cervical spine was performed following the standard protocol without intravenous contrast. Multiplanar CT image reconstructions of the cervical spine were also generated. COMPARISON:  None. CT neck September 24, 2012 FINDINGS: CT HEAD FINDINGS BRAIN: No intraparenchymal hemorrhage, mass effect nor midline shift. The ventricles and sulci are normal for age. Patchy supratentorial white matter hypodensities. Mildly heterogeneous basal ganglia with chronic small vessel ischemic changes. No acute large vascular territory infarcts. No abnormal extra-axial fluid collections. Basal cisterns are patent.  VASCULAR: Moderate calcific atherosclerosis of the carotid siphons. SKULL: No skull fracture. No significant scalp soft tissue swelling. SINUSES/ORBITS: Trace paranasal sinus mucosal thickening. Mastoid air cells are well aerated.The included ocular globes and orbital contents are non-suspicious. OTHER: None. CT CERVICAL SPINE FINDINGS- Large body habitus results in overall noisy image quality. ALIGNMENT: Straightened lordosis.  Vertebral bodies in alignment. 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 Cervical vertebral bodies and posterior elements are otherwise intact. Intervertebral disc heights preserved. No destructive bony lesions. C1-2 articulation maintained. SOFT TISSUES AND SPINAL CANAL: Nonacute. Mild calcific atherosclerosis carotid bifurcations. DISC LEVELS: No significant osseous canal stenosis or neural foraminal narrowing. UPPER CHEST: Lung apices are clear. OTHER: None. IMPRESSION: CT HEAD: 1. No acute intracranial process. 2. Mild chronic small vessel ischemic changes. 3. Moderate intracranial atherosclerosis. CT CERVICAL SPINE: 1. No fracture or malalignment. No advanced degenerative change for age. Electronically Signed   By: Awilda Metro M.D.   On: 01/12/2018 23:17   Dg Chest Port 1 View  Result Date: 01/12/2018 CLINICAL DATA:  57 year old female with respiratory distress. EXAM: PORTABLE CHEST 1 VIEW COMPARISON:  CTA chest 05/27/2017 and earlier. FINDINGS: Portable AP semi upright view at 2121 hours. Stable mild cardiomegaly. Other mediastinal contours are within normal limits. Visualized tracheal air column is within normal limits. Allowing for portable technique the lungs are clear. No pneumothorax or pleural effusion. Paucity bowel gas in the upper abdomen. No acute osseous abnormality identified. IMPRESSION: No acute cardiopulmonary abnormality. Electronically Signed   By: Odessa Fleming M.D.   On: 01/12/2018 21:32     The results of significant diagnostics from this  hospitalization (including imaging, microbiology, ancillary and laboratory) are listed below for reference.     Microbiology: Recent Results (from the past 240 hour(s))  Culture, blood (routine x 2)     Status: None (Preliminary result)   Collection Time: 01/12/18  9:25 PM  Result Value Ref Range Status   Specimen Description BLOOD LEFT FOREARM  Final   Special Requests   Final    BOTTLES DRAWN AEROBIC AND ANAEROBIC Blood Culture adequate volume Performed at [facility], [address] [place], [place] [address]    Culture NO GROWTH 2 DAYS  Final   Report Status PENDING  Incomplete  Culture, blood (routine x 2)     Status: None (Preliminary result)   Collection Time: 01/13/18  2:05 AM  Result Value Ref Range Status   Specimen Description BLOOD LEFT HAND  Final   Special Requests   Final    BOTTLES DRAWN AEROBIC AND ANAEROBIC Blood Culture adequate volume Performed at [facility], [address] [place], [place] [address]  Culture NO GROWTH 1 DAY  Final   Report Status PENDING  Incomplete     Labs: BNP (last 3 results) Recent Labs    05/27/17 1333 09/11/17 1313  BNP 271.6* 47.7   Basic Metabolic Panel: Recent Labs  Lab 01/12/18 2121 01/12/18 2129 01/13/18 0752 01/14/18 0606  NA 135 133* 139 138  K 4.1 3.9 3.6 3.3*  CL 98 100 103 105  CO2 21*  --  21* 23  GLUCOSE 195* 194* 103* 97  BUN 24* 29* 25* 8  CREATININE 1.68* 1.50* 1.10* 0.73  CALCIUM 8.7*  --  7.7* 7.9*   Liver Function Tests: Recent Labs  Lab 01/12/18 2121  AST 45*  ALT 32  ALKPHOS 94  BILITOT 0.7  PROT 7.2  ALBUMIN 3.6   No results for input(s): LIPASE, AMYLASE in the last 168 hours. No results for input(s): AMMONIA in the last 168 hours. CBC: Recent Labs  Lab 01/12/18 2121 01/12/18 2129 01/13/18 0752 01/14/18 0606  WBC 8.1  --  6.0 4.7  NEUTROABS 5.0  --   --   --   HGB 14.3 16.0* 12.1 12.6  HCT 45.9 47.0* 38.7 40.6  MCV 90.2  --  90.0 90.6  PLT 225  --  187 184    Cardiac Enzymes: Recent Labs  Lab 01/13/18 0752 01/13/18 1347 01/13/18 1905  TROPONINI 0.04* 0.05* 0.06*   BNP: Invalid input(s): POCBNP CBG: Recent Labs  Lab 01/13/18 2037 01/14/18 0030 01/14/18 0434 01/14/18 0723 01/14/18 1057  GLUCAP 162* 82 81 85 110*   D-Dimer No results for input(s): DDIMER in the last 72 hours. Hgb A1c No results for input(s): HGBA1C in the last 72 hours. Lipid Profile No results for input(s): CHOL, HDL, LDLCALC, TRIG, CHOLHDL, LDLDIRECT in the last 72 hours. Thyroid function studies No results for input(s): TSH, T4TOTAL, T3FREE, THYROIDAB in the last 72 hours.  Invalid input(s): FREET3 Anemia work up No results for input(s): VITAMINB12, FOLATE, FERRITIN, TIBC, IRON, RETICCTPCT in the last 72 hours. Urinalysis    Component Value Date/Time   COLORURINE YELLOW 01/13/2018 0345   APPEARANCEUR CLOUDY (A) 01/13/2018 0345   LABSPEC 1.015 01/13/2018 0345   PHURINE 5.0 01/13/2018 0345   GLUCOSEU NEGATIVE 01/13/2018 0345   HGBUR NEGATIVE 01/13/2018 0345   BILIRUBINUR NEGATIVE 01/13/2018 0345   KETONESUR 5 (A) 01/13/2018 0345   PROTEINUR 30 (A) 01/13/2018 0345   UROBILINOGEN 0.2 07/06/2014 1342   NITRITE NEGATIVE 01/13/2018 0345   LEUKOCYTESUR NEGATIVE 01/13/2018 0345   Sepsis Labs Invalid input(s): PROCALCITONIN,  WBC,  LACTICIDVEN Microbiology Recent Results (from the past 240 hour(s))  Culture, blood (routine x 2)     Status: None (Preliminary result)   Collection Time: 01/12/18  9:25 PM  Result Value Ref Range Status   Specimen Description BLOOD LEFT FOREARM  Final   Special Requests   Final    BOTTLES DRAWN AEROBIC AND ANAEROBIC Blood Culture adequate volume Performed at [facility], [address], [place], [place] [address]    Culture NO GROWTH 2 DAYS  Final   Report Status PENDING  Incomplete  Culture, blood (routine x 2)     Status: None (Preliminary result)   Collection Time: 01/13/18  2:05 AM  Result Value Ref Range  Status   Specimen Description BLOOD LEFT HAND  Final   Special Requests   Final    BOTTLES DRAWN AEROBIC AND ANAEROBIC Blood Culture adequate volume Performed at [facility], [address]  [place], [place] [address]    Culture NO GROWTH 1 DAY  Final   Report Status PENDING  Incomplete     Time coordinating discharge in minutes: 50  SIGNED:   Calvert CantorSaima Duane Earnshaw, MD  [facility] 01/14/2018, 11:55 AM Pager   If 7PM-7AM, please contact night-coverage [email or web address] Password [email or web address]

## 2018-01-14 NOTE — Progress Notes (Signed)
  SATURATION QUALIFICATIONS: (This note is used to comply with regulatory documentation for home oxygen)  Patient Saturations on Room Air at Rest = 100%  Patient Saturations on Room Air while Ambulating = 95%  Patient did not require oxygen for ambulation to maintain saturations.

## 2018-01-14 NOTE — Plan of Care (Signed)

## 2018-01-17 LAB — CULTURE, BLOOD (ROUTINE X 2)
Culture: NO GROWTH
Special Requests: ADEQUATE

## 2018-01-18 LAB — CULTURE, BLOOD (ROUTINE X 2)
CULTURE: NO GROWTH
Special Requests: ADEQUATE

## 2018-02-24 ENCOUNTER — Encounter (HOSPITAL_COMMUNITY): Payer: Self-pay

## 2018-02-24 ENCOUNTER — Other Ambulatory Visit: Payer: Self-pay

## 2018-02-24 ENCOUNTER — Ambulatory Visit (HOSPITAL_COMMUNITY)
Admission: RE | Admit: 2018-02-24 | Discharge: 2018-02-24 | Disposition: A | Discharge status: Home / Self Care | Payer: Self-pay | Source: Ambulatory Visit | Attending: Cardiology | Admitting: Cardiology

## 2018-02-24 VITALS — BP 218/108 | HR 98 | Wt 198.8 lb

## 2018-02-24 DIAGNOSIS — I2510 Atherosclerotic heart disease of native coronary artery without angina pectoris: Secondary | ICD-10-CM | POA: Insufficient documentation

## 2018-02-24 DIAGNOSIS — I110 Hypertensive heart disease with heart failure: Secondary | ICD-10-CM | POA: Insufficient documentation

## 2018-02-24 DIAGNOSIS — J441 Chronic obstructive pulmonary disease with (acute) exacerbation: Secondary | ICD-10-CM | POA: Insufficient documentation

## 2018-02-24 DIAGNOSIS — E119 Type 2 diabetes mellitus without complications: Secondary | ICD-10-CM | POA: Insufficient documentation

## 2018-02-24 DIAGNOSIS — Z794 Long term (current) use of insulin: Secondary | ICD-10-CM | POA: Insufficient documentation

## 2018-02-24 DIAGNOSIS — I428 Other cardiomyopathies: Secondary | ICD-10-CM | POA: Insufficient documentation

## 2018-02-24 DIAGNOSIS — J449 Chronic obstructive pulmonary disease, unspecified: Secondary | ICD-10-CM | POA: Insufficient documentation

## 2018-02-24 DIAGNOSIS — I10 Essential (primary) hypertension: Secondary | ICD-10-CM

## 2018-02-24 DIAGNOSIS — Z79899 Other long term (current) drug therapy: Secondary | ICD-10-CM | POA: Insufficient documentation

## 2018-02-24 DIAGNOSIS — F17210 Nicotine dependence, cigarettes, uncomplicated: Secondary | ICD-10-CM | POA: Insufficient documentation

## 2018-02-24 DIAGNOSIS — Z7901 Long term (current) use of anticoagulants: Secondary | ICD-10-CM | POA: Insufficient documentation

## 2018-02-24 DIAGNOSIS — Z720 Tobacco use: Secondary | ICD-10-CM

## 2018-02-24 DIAGNOSIS — Z7982 Long term (current) use of aspirin: Secondary | ICD-10-CM | POA: Insufficient documentation

## 2018-02-24 DIAGNOSIS — I5022 Chronic systolic (congestive) heart failure: Secondary | ICD-10-CM | POA: Insufficient documentation

## 2018-02-24 DIAGNOSIS — R0683 Snoring: Secondary | ICD-10-CM | POA: Insufficient documentation

## 2018-02-24 DIAGNOSIS — M5136 Other intervertebral disc degeneration, lumbar region: Secondary | ICD-10-CM | POA: Insufficient documentation

## 2018-02-24 LAB — BASIC METABOLIC PANEL
Anion gap: 11 (ref 5–15)
BUN: 12 mg/dL (ref 6–20)
CO2: 27 mmol/L (ref 22–32)
Calcium: 9.5 mg/dL (ref 8.9–10.3)
Chloride: 102 mmol/L (ref 98–111)
Creatinine, Ser: 1.05 mg/dL — ABNORMAL HIGH (ref 0.44–1.00)
GFR calc Af Amer: >60 mL/min (ref >60)
GFR, EST NON AFRICAN AMERICAN: 59 mL/min — AB (ref >60)
Glucose, Bld: 187 mg/dL — ABNORMAL HIGH (ref 70–99)
Potassium: 4.2 mmol/L (ref 3.5–5.1)
Sodium: 140 mmol/L (ref 135–145)

## 2018-02-24 MED ORDER — HYDRALAZINE HCL 50 MG PO TABS: 50.0000 mg | ORAL_TABLET | Freq: Three times a day (TID) | ORAL | 6 refills | Status: DC

## 2018-02-24 NOTE — Progress Notes (Signed)
Advanced Heart Failure Clinic Note   PCP: Patient, No Pcp Per PCP-Cardiologist: Dr Gala Romney  HPI: Felicia [name] is a 58 y.o. female with h/o of obesity, tobacco abuse, COPD, probable OSA, IDDM, HTN, NICM.  Admitted 5/7-5/10/19 with SOB and wheezing. Echo showed newly reduced EF 20-25% (previously normal 2013, but reportedly low in 2012). HF team was consulted. Underwent R/LHC that showed nonobstructive CAD and well compensated hemodynamics. She was also treated for COPD exacerbation with steroids and nebulizers. Diuresed with IV lasix. DC weight: 203 lbs.  Today she returns for HF follow up. Overall feeling fine. Denies SOB/PND/Orthopnea. Appetite ok. No fever or chills. Weight at home 196-199 pounds. Taking all medications but just took medications right before the appointment. Lives with her daughter. Her children help her get medications. Smoking 5-6 cigarettes per day.   Echo 08/2017 EF 40-45% RV normal    SH: Lives at home with daughters, manages her own medications, daughters drive her to appointments.  No ETOH or drugs.   [facility] 05/29/17  Prox LAD lesion is 20% stenosed.  Mid Cx lesion is 40% stenosed. Ao = 132/80 (100) LV = 127/19 RA = 7 RV = 38/8 PA = 39/12 (26) PCW = 14 Fick cardiac output/index = 5.3/2.9 PVR = 2.3 WU SVR = 1408 Ao sat = 93% PA sat = 66%, 68%  Assessment: 1. Co-dominant coronary system with separate ostial for LAD and LCx 2. Minimal non-obstructive CAD 3. NICM EF 25-30%  4. Well-compensated hemodynamics  Echo5/8/19LVEF 25-30%, Trivial MR, Mild TR, PA peak pressure 40 mm Hg  Past Medical History:  Diagnosis Date  . Cardiomyopathy   . CHF (congestive heart failure) (HCC)    minamal  . COPD (chronic obstructive pulmonary disease) (HCC)   . DDD (degenerative disc disease), lumbar   . Diabetes mellitus   . Hypertension   . Non compliance w medication regimen   . Normal echocardiogram    LVEF 25-30% 03/27/2010    Current Outpatient  Medications  Medication Sig Dispense Refill  . acetaminophen (TYLENOL) 500 MG tablet Take 1,500 mg by mouth daily as needed for mild pain or headache.    . albuterol (PROVENTIL HFA;VENTOLIN HFA) 108 (90 Base) MCG/ACT inhaler Inhale 2 puffs into the lungs every 6 (six) hours as needed for wheezing or shortness of breath. 1 Inhaler 0  . aspirin 81 MG chewable tablet Chew 81 mg by mouth daily.    Marland Kitchen bismuth subsalicylate (PEPTO BISMOL) 262 MG/15ML suspension Take 30 mLs by mouth every 4 (four) hours as needed for indigestion.     . carvedilol (COREG) 6.25 MG tablet Take 1 tablet (6.25 mg total) by mouth 2 (two) times daily with a meal. 60 tablet 5  . diphenhydrAMINE (BENADRYL) 25 MG tablet Take 25 mg by mouth every 6 (six) hours as needed for allergies.     . diphenhydramine-acetaminophen (TYLENOL PM EXTRA STRENGTH) 25-500 MG TABS tablet Take 2 tablets by mouth at bedtime as needed (sleep).     Marland Kitchen guaiFENesin (MUCINEX) 600 MG 12 hr tablet Take 1 tablet (600 mg total) by mouth 2 (two) times daily. 30 tablet 0  . hydrALAZINE (APRESOLINE) 25 MG tablet Take 1 tablet (25 mg total) by mouth 3 (three) times daily. 90 tablet 6  . ibuprofen (ADVIL,MOTRIN) 200 MG tablet Take 800 mg by mouth every 8 (eight) hours as needed for moderate pain.     Marland Kitchen insulin NPH-regular Human (NOVOLIN 70/30) (70-30) 100 UNIT/ML injection Inject 40 Units into the skin  2 (two) times daily. 24 mL 0  . ipratropium-albuterol (DUONEB) 0.5-2.5 (3) MG/3ML SOLN Take 3 mLs by nebulization every 6 (six) hours as needed. (Patient taking differently: Take 3 mLs by nebulization every 6 (six) hours as needed (SOB). ) 360 mL 0  . isosorbide mononitrate (IMDUR) 30 MG 24 hr tablet Take 1 tablet (30 mg total) by mouth daily. 30 tablet 6  . Multiple Vitamin (MULTIVITAMIN WITH MINERALS) TABS tablet Take 1 tablet by mouth once a week.     . sacubitril-valsartan (ENTRESTO) 97-103 MG Take 1 tablet by mouth 2 (two) times daily. 180 tablet 3  . spironolactone  (ALDACTONE) 25 MG tablet TAKE 1 TABLET BY MOUTH ONCE DAILY (Patient taking differently: Take 25 mg by mouth daily. ) 30 tablet 3   No current facility-administered medications for this encounter.     Allergies  Allergen Reactions  . Banana Itching  . Latex Itching      Social History   Socioeconomic History  . Marital status: Single    Spouse name: Not on file  . Number of children: 2  . Years of education: Not on file  . Highest education level: Not on file  Occupational History  . Not on file  Social Needs  . Financial resource strain: Not on file  . Food insecurity:    Worry: Not on file    Inability: Not on file  . Transportation needs:    Medical: Not on file    Non-medical: Not on file  Tobacco Use  . Smoking status: Current Every Day Smoker    Packs/day: 0.50    Types: Cigarettes  . Smokeless tobacco: Never Used  Substance and Sexual Activity  . Alcohol use: No  . Drug use: No  . Sexual activity: Yes    Birth control/protection: Surgical, Post-menopausal  Lifestyle  . Physical activity:    Days per week: Not on file    Minutes per session: Not on file  . Stress: Not on file  Relationships  . Social connections:    Talks on phone: Not on file    Gets together: Not on file    Attends religious service: Not on file    Active member of club or organization: Not on file    Attends meetings of clubs or organizations: Not on file    Relationship status: Not on file  . Intimate partner violence:    Fear of current or ex partner: Not on file    Emotionally abused: Not on file    Physically abused: Not on file    Forced sexual activity: Not on file  Other Topics Concern  . Not on file  Social History Narrative  . Not on file      Family History  Problem Relation Age of Onset  . Stroke Mother   . Lung cancer Father     Vitals:   02/24/18 1438  BP: (!) 218/108  Pulse: 98  SpO2: 98%  Weight: 90.2 kg (198 lb 12.8 oz)   Wt Readings from Last 3  Encounters:  02/24/18 90.2 kg (198 lb 12.8 oz)  01/13/18 88.6 kg (195 lb 5.2 oz)  09/11/17 91.3 kg (201 lb 3.2 oz)    PHYSICAL EXAM: General:  Well appearing. No resp difficulty HEENT: normal Neck: supple. no JVD. Carotids 2+ bilat; no bruits. No lymphadenopathy or thryomegaly appreciated. Cor: PMI nondisplaced. Regular rate & rhythm. No rubs, gallops or murmurs. Lungs: clear Abdomen:obese soft, nontender, nondistended. No hepatosplenomegaly. No bruits  or masses. Good bowel sounds. Extremities: no cyanosis, clubbing, rash, edema Neuro: alert & orientedx3, cranial nerves grossly intact. moves all 4 extremities w/o difficulty. Affect pleasant  ASSESSMENT & PLAN:  1. Chronic systolic CHF - History of NICM. Echo in 2013 with normal EF. Suspect HTN cardiomyopathy (vs OSA) -Echo5/8/19LVEF 25-30%, Trivial MR, Mild TR, PA peak pressure 40 mm Hg - R/LHC 05/29/17 with well compensated hemodynamics and minimal, non-obstructive CAD.   EF 08/2017 40-45%  - NYHA II. Volume status stable.   - Continue coreg 6.25 mg BID. - Continue Entresto to 97/103 BID.  -Continue spiro 25 mg daily.  - Increase hydralazine to 50 mg tid. Continue imdur 30 mg daily. - Check BMET today.   2. COPD - Encouraged her to quit smoking. No change.  3. Tobacco Abuse -Discussed smoking cessation.  4. IDDM - HgB A1c 8.6 05/28/17 - Consider Jardiance. [facility]ouraged [facility] to [facility] 5. HTN -Uncontrolled. Increase hydralazine to 50mg  three times a day.  6. Snores -Referred for sleep study. They have called her, but she is unable to complete until she has insurance.   Follow up in 2 weeks for BP check. May need to further increase hydralazine. Follow up in 3 months with APP.   Tonye BecketAmy Raidon Swanner, NP 02/24/18

## 2018-02-24 NOTE — Patient Instructions (Signed)
Labs done today. We will contact you for any abnormal lab work.  INCREASE Hydralazine 50mg  (1 tab) three times daily  Follow up in 2 weeks for a nurse visit to have your blood pressure checked.  Follow up with the Advanced Practice Provider in 3 months

## 2018-03-10 ENCOUNTER — Encounter (HOSPITAL_COMMUNITY): Payer: Self-pay

## 2018-05-25 ENCOUNTER — Telehealth (HOSPITAL_COMMUNITY): Payer: Self-pay | Admitting: Licensed Clinical Social Worker

## 2018-05-25 ENCOUNTER — Ambulatory Visit (HOSPITAL_COMMUNITY)
Admission: RE | Admit: 2018-05-25 | Discharge: 2018-05-25 | Disposition: A | Discharge status: Home / Self Care | Payer: Self-pay | Source: Ambulatory Visit | Attending: Internal Medicine | Admitting: Internal Medicine

## 2018-05-25 ENCOUNTER — Other Ambulatory Visit: Payer: Self-pay

## 2018-05-25 DIAGNOSIS — Z720 Tobacco use: Secondary | ICD-10-CM

## 2018-05-25 DIAGNOSIS — I10 Essential (primary) hypertension: Secondary | ICD-10-CM

## 2018-05-25 DIAGNOSIS — I5022 Chronic systolic (congestive) heart failure: Secondary | ICD-10-CM

## 2018-05-25 DIAGNOSIS — R0683 Snoring: Secondary | ICD-10-CM

## 2018-05-25 DIAGNOSIS — I428 Other cardiomyopathies: Secondary | ICD-10-CM

## 2018-05-25 MED ORDER — CARVEDILOL 6.25 MG PO TABS: 6.2500 mg | ORAL_TABLET | Freq: Two times a day (BID) | ORAL | 1 refills | Status: DC

## 2018-05-25 MED ORDER — SPIRONOLACTONE 25 MG PO TABS: 25.0000 mg | ORAL_TABLET | Freq: Every day | ORAL | 1 refills | Status: DC

## 2018-05-25 MED ORDER — ISOSORBIDE MONONITRATE ER 30 MG PO TB24: 30.0000 mg | ORAL_TABLET | Freq: Every day | ORAL | 1 refills | Status: DC

## 2018-05-25 NOTE — Progress Notes (Signed)
LM for patient to return call to office re: discuss AVS.

## 2018-05-25 NOTE — Patient Instructions (Addendum)
Referred to Heart Failure Social work  to assist with referral to [facility] for PCP and provide a scale.  Today we will refilled carvedilol, imdur, and spironolactone.  Sent to [facility] [place]   Recommended follow-up: Follow up in 3 months. You will get a call to schedule this appointment.

## 2018-05-25 NOTE — Progress Notes (Signed)
Heart Failure TeleHealth Note  Due to national recommendations of social distancing due to COVID 19, Audio/video telehealth visit is felt to be most appropriate for this patient at this time.  See MyChart message from today for patient consent regarding telehealth for [facility].  Date:  05/25/2018   ID:  Felicia [name], DOB 02/17/1960, MRN [id number]  Location: Home  Provider location: [facility] Type of Visit: Established patient   PCP: None Cardiologist:  No primary care provider on file. Primary HF: Dr Gala Romney   Chief Complaint: Heart Failure   History of Present Illness: Felicia [name] is a 58 y.o. female with a history of is a 57 y.o. female with h/o of obesity, tobacco abuse, COPD, probable OSA, IDDM, HTN, NICM.  Admitted 5/7-5/10/19 with SOB and wheezing. Echo showed newly reduced EF 20-25% (previously normal 2013, but reportedly low in 2012). Underwent R/LHC that showed nonobstructive CAD and well compensated hemodynamics. She was also treated for COPD exacerbation with steroids and nebulizers. Diuresed with IV lasix. DC weight: 203 lbs.  She presents via Web designer for a telehealth visit today.  Overall feeling fine. Mild dyspnea with steps.  Denies PND/Orthopnea. Denies chest pain. Appetite ok. No fever or chills. Smoking 5 cigarettes a day. She does not weigh at home. Taking all medications. Says she has not seen PCP in over 1 year and has been managing her own diabetes.  She has lives with daughters and grand children.    she denies symptoms worrisome for COVID 19.  LHC/RHC 05/2017   Prox LAD lesion is 20% stenosed.  Mid Cx lesion is 40% stenosed. Ao = 132/80 (100) LV = 127/19 RA = 7 RV = 38/8 PA = 39/12 (26) PCW = 14 Fick cardiac output/index = 5.3/2.9 PVR = 2.3 WU SVR = 1408 Ao sat = 93% PA sat = 66%, 68% Assessment: 1. Co-dominant coronary system with separate ostial for LAD and LCx 2. Minimal  non-obstructive CAD 3. NICM EF 25-30%  4. Well-compensated hemodynamics  ECHO 2019 40-45% Grade IDD  Echo5/8/19LVEF 25-30%, Trivial MR, Mild TR, PA peak pressure 40 mm Hg    Past Medical History:  Diagnosis Date  . Cardiomyopathy   . CHF (congestive heart failure) (HCC)    minamal  . COPD (chronic obstructive pulmonary disease) (HCC)   . DDD (degenerative disc disease), lumbar   . Diabetes mellitus   . Hypertension   . Non compliance w medication regimen   . Normal echocardiogram    LVEF 25-30% 03/27/2010   Past Surgical History:  Procedure Laterality Date  . CARDIAC CATHETERIZATION    . RIGHT/LEFT HEART CATH AND CORONARY ANGIOGRAPHY N/A 05/29/2017   Procedure: RIGHT/LEFT HEART CATH AND CORONARY ANGIOGRAPHY;  Surgeon: Dolores Patty, MD;  [facility];  Service: Cardiovascular;  Laterality: N/A;  . TUBAL LIGATION       Current Outpatient Medications  Medication Sig Dispense Refill  . acetaminophen (TYLENOL) 500 MG tablet Take 1,500 mg by mouth daily as needed for mild pain or headache.    . albuterol (PROVENTIL HFA;VENTOLIN HFA) 108 (90 Base) MCG/ACT inhaler Inhale 2 puffs into the lungs every 6 (six) hours as needed for wheezing or shortness of breath. 1 Inhaler 0  . aspirin 81 MG chewable tablet Chew 81 mg by mouth daily.    Marland Kitchen bismuth subsalicylate (PEPTO BISMOL) 262 MG/15ML suspension Take 30 mLs by mouth every 4 (four) hours as needed for indigestion.     Marland Kitchen  carvedilol (COREG) 6.25 MG tablet Take 1 tablet (6.25 mg total) by mouth 2 (two) times daily with a meal. 60 tablet 5  . diphenhydrAMINE (BENADRYL) 25 MG tablet Take 25 mg by mouth every 6 (six) hours as needed for allergies.     . diphenhydramine-acetaminophen (TYLENOL PM EXTRA STRENGTH) 25-500 MG TABS tablet Take 2 tablets by mouth at bedtime as needed (sleep).     Marland Kitchen guaiFENesin (MUCINEX) 600 MG 12 hr tablet Take 1 tablet (600 mg total) by mouth 2 (two) times daily. 30 tablet 0  . hydrALAZINE  (APRESOLINE) 50 MG tablet Take 1 tablet (50 mg total) by mouth 3 (three) times daily. 90 tablet 6  . ibuprofen (ADVIL,MOTRIN) 200 MG tablet Take 800 mg by mouth every 8 (eight) hours as needed for moderate pain.     Marland Kitchen insulin NPH-regular Human (NOVOLIN 70/30) (70-30) 100 UNIT/ML injection Inject 40 Units into the skin 2 (two) times daily. 24 mL 0  . ipratropium-albuterol (DUONEB) 0.5-2.5 (3) MG/3ML SOLN Take 3 mLs by nebulization every 6 (six) hours as needed. (Patient taking differently: Take 3 mLs by nebulization every 6 (six) hours as needed (SOB). ) 360 mL 0  . isosorbide mononitrate (IMDUR) 30 MG 24 hr tablet Take 1 tablet (30 mg total) by mouth daily. 30 tablet 6  . Multiple Vitamin (MULTIVITAMIN WITH MINERALS) TABS tablet Take 1 tablet by mouth once a week.     . sacubitril-valsartan (ENTRESTO) 97-103 MG Take 1 tablet by mouth 2 (two) times daily. 180 tablet 3  . spironolactone (ALDACTONE) 25 MG tablet TAKE 1 TABLET BY MOUTH ONCE DAILY (Patient taking differently: Take 25 mg by mouth daily. ) 30 tablet 3   No current facility-administered medications for this encounter.     Allergies:   Banana and Latex   Social History:  The patient  reports that she has been smoking cigarettes. She has been smoking about 0.50 packs per day. She has never used smokeless tobacco. She reports that she does not drink alcohol or use drugs.   Family History:  The patient's family history includes Lung cancer in her father; Stroke in her mother.   ROS:  Please see the history of present illness.   All other systems are personally reviewed and negative.   Exam:  Tele Health Call; Exam is subjective  General:  Speaks in full sentences. No resp difficulty. Lungs: Normal respiratory effort with conversation.  Abdomen: Non-distended per patient report Extremities: Pt denies edema. Neuro: Alert & oriented x 3.   Recent Labs: 09/02/2017: Magnesium 2.2 09/11/2017: B Natriuretic Peptide 47.7 01/12/2018: ALT 32  01/14/2018: Hemoglobin 12.6; Platelets 184 02/24/2018: BUN 12; Creatinine, Ser 1.05; Potassium 4.2; Sodium 140  Personally reviewed   Wt Readings from Last 3 Encounters:  02/24/18 90.2 kg (198 lb 12.8 oz)  01/13/18 88.6 kg (195 lb 5.2 oz)  09/11/17 91.3 kg (201 lb 3.2 oz)      ASSESSMENT AND PLAN:  1. Chronic systolic CHF - History of NICM. Echo in 2013 with normal EF. Suspect HTN cardiomyopathy (vs OSA) -Echo5/8/19LVEF 25-30%, Trivial MR, Mild TR, PA peak pressure 40 mm Hg - R/LHC 05/29/17 with well compensated hemodynamics and minimal, non-obstructive CAD.  EF 08/2017 40-45%  - NYHA II. Volume status stable.   - Continue coreg 6.25 mg BID. - Continue Entresto to 97/103 BID.  -Continue spiro 25 mg daily.  - Continue hydralazine to 50 mg tid. Continue imdur 30 mg daily. 2. COPD Needs stop smoking.  3.  Tobacco Abuse -Discussed smoking cessation.  4. IDDM - HgB A1c 8.6 05/28/17 - Consider Jardiance. - Needs to be referred to [facility]. She has not been seen by PCP in over 1 year.  5. HTN 6. Snores Unable to get sleep study due to lack of insurance.   COVID screen The patient does not have any symptoms that suggest any further testing/ screening at this time.  Social distancing reinforced today.  Patient Risk: After full review of this patients clinical status, I feel that they are at moderate risk for cardiac decompensation at this time.  Relevant cardiac medications were reviewed at length with the patient today. The patient does not have concerns regarding their medications at this time.   The following changes were made today:  Referred to HFSW to assist with referral to  [facility] for PCP. She needs diabetes follow up. Today we will refill carvedilol, imdur, and spironolactone.   Recommended follow-up:  Follow up in 3 months   Today, I have spent 25 minutes with the patient with telehealth technology discussing the above issues  .    Waneta Martins, NP  05/25/2018 9:45 AM  [facility] [address] [facility] [place] [address] [phone] (office) [phone] (fax) .

## 2018-05-25 NOTE — Addendum Note (Signed)
Encounter addended by: Marisa Hua, RN on: 05/25/2018 10:21 AM  Actions taken: Order list changed, Clinical Note Signed

## 2018-05-25 NOTE — Telephone Encounter (Signed)
CSW referred to order patient a scale to be delivered to patient's home. CSW ordered and left message for patient that scale will arrive by the end of the week. CSW available if needed. Lasandra Beech, LCSW, CCSW-MCS [phone]

## 2018-05-26 ENCOUNTER — Telehealth: Payer: Self-pay

## 2018-05-26 ENCOUNTER — Telehealth (HOSPITAL_COMMUNITY): Payer: Self-pay | Admitting: Licensed Clinical Social Worker

## 2018-05-26 NOTE — Telephone Encounter (Addendum)
CSW consulted to assist patient in getting PCP.   CSW called pt who confirms she does not have a PCP at this time.  Pt without insurance currently and no source of income.  Pt reports she lost her Medicaid about 5 years ago when her son aged out of the children's Medicaid program.  Pt states she applied for disability about 3 years ago but was denied and has not tried since.  CSW encouraged pt to apply for Medicaid and disability as she might qualify with her multiple medical problems.  Pt agreeable to having referral put in with a [facility] provider that can take patient without insurance coverage.  CSW sent referral into [facility] who set up pt appt for 05/18 at 9:30am.  CSW called pt back and informed of appointment.  CSW will continue to follow and assist as needed  Burna Sis, LCSW Clinical Social Worker [facility] Desk#: [phone] Cell#: [phone]

## 2018-05-26 NOTE — Telephone Encounter (Signed)
Message received from Rosetta Posner, [place] requesting an appointment for patient to establish care at [facility]. Informed her that an appointment has been scheduled for 06/08/2018 @ 0930 - the provider will determine if it will be a televisit or in person visit.

## 2018-06-08 ENCOUNTER — Ambulatory Visit: Payer: Self-pay | Admitting: Primary Care

## 2018-06-26 ENCOUNTER — Ambulatory Visit (HOSPITAL_COMMUNITY)
Admission: EM | Admit: 2018-06-26 | Discharge: 2018-06-26 | Disposition: A | Discharge status: Home / Self Care | Payer: Self-pay | Attending: Emergency Medicine | Admitting: Emergency Medicine

## 2018-06-26 ENCOUNTER — Other Ambulatory Visit: Payer: Self-pay

## 2018-06-26 ENCOUNTER — Encounter (HOSPITAL_COMMUNITY): Payer: Self-pay

## 2018-06-26 DIAGNOSIS — N390 Urinary tract infection, site not specified: Secondary | ICD-10-CM | POA: Insufficient documentation

## 2018-06-26 DIAGNOSIS — N760 Acute vaginitis: Secondary | ICD-10-CM | POA: Insufficient documentation

## 2018-06-26 MED ORDER — FLUCONAZOLE 200 MG PO TABS: ORAL_TABLET | ORAL | 0 refills | Status: DC

## 2018-06-26 MED ORDER — CEPHALEXIN 500 MG PO CAPS: 500.0000 mg | ORAL_CAPSULE | Freq: Two times a day (BID) | ORAL | 0 refills | Status: AC

## 2018-06-26 NOTE — ED Provider Notes (Signed)
[facility]    CSN: [id number] Arrival date & time: 06/26/18  0802     History   Chief Complaint Chief Complaint  Patient presents with   Vaginitis   Urinary Tract Infection    HPI Felicia [name] is a 58 y.o. female.   [name] presents with complaints of urinary frequency and pain with urination which started over the past 4 days. She voids small frequent amounts. No known blood in urine. No gi complaints. Intermittent right lower abdominal pain. No fevers. Has taken ibuprofen intermittently which hasn't helped with symptoms. Also with vaginal itching. Denies any obvious vaginal discharge. States she has a new partner recently and don't use condoms. No specific known STD exposure. Has had yeast infections in the past. She has been taking her insulin and medications but states she hasn't been checking her blood sugars. Has had UTI in the past as well, which feels similar. She is postmenopausal. No back pain. Hx of chf, copd, dm, htn.     ROS per HPI, negative if not otherwise mentioned.      Past Medical History:  Diagnosis Date   Cardiomyopathy    CHF (congestive heart failure) (HCC)    minamal   COPD (chronic obstructive pulmonary disease) (HCC)    DDD (degenerative disc disease), lumbar    Diabetes mellitus    Hypertension    Non compliance w medication regimen    Normal echocardiogram    LVEF 25-30% 03/27/2010    Patient Active Problem List   Diagnosis Date Noted   Influenza B 01/13/2018   Syncope 01/13/2018   AKI (acute kidney injury) (HCC) 01/13/2018   Nausea & vomiting 01/13/2018   COPD (chronic obstructive pulmonary disease) (HCC) 06/11/2017   Chronic systolic heart failure (HCC) 06/11/2017   Snoring 06/11/2017   Acute systolic heart failure (HCC)    COPD with acute exacerbation (HCC) 05/27/2017   Obesity 05/27/2017   Tobacco use 05/27/2017   Acute respiratory failure with hypoxia (HCC) 05/27/2017    Hyperglycemia 01/13/2015   Back abscess 01/13/2015   Uncontrolled diabetes mellitus (HCC) 01/13/2015   Hypertension 05/31/2011   Nonischemic cardiomyopathy (HCC) 04/11/2010   DM2 (diabetes mellitus, type 2) (HCC) 04/11/2010    Past Surgical History:  Procedure Laterality Date   CARDIAC CATHETERIZATION     RIGHT/LEFT HEART CATH AND CORONARY ANGIOGRAPHY N/A 05/29/2017   Procedure: RIGHT/LEFT HEART CATH AND CORONARY ANGIOGRAPHY;  Surgeon: Dolores Patty, MD;  [facility];  Service: Cardiovascular;  Laterality: N/A;   TUBAL LIGATION      OB History    Gravida  7   Para  5   Term  5   Preterm      AB  2   Living  5     SAB  2   TAB      Ectopic      Multiple      Live Births               Home Medications    Prior to Admission medications   Medication Sig Start Date End Date Taking? Authorizing Provider  acetaminophen (TYLENOL) 500 MG tablet Take 1,500 mg by mouth daily as needed for mild pain or headache.    [provider]  albuterol (PROVENTIL HFA;VENTOLIN HFA) 108 (90 Base) MCG/ACT inhaler Inhale 2 puffs into the lungs every 6 (six) hours as needed for wheezing or shortness of breath. 05/30/17   Burnadette Pop, MD  aspirin 81 MG chewable tablet Chew 81 mg by mouth daily. 04/16/16   [provider]  bismuth subsalicylate (PEPTO BISMOL) 262 MG/15ML suspension Take 30 mLs by mouth every 4 (four) hours as needed for indigestion.     [provider]  carvedilol (COREG) 6.25 MG tablet Take 1 tablet (6.25 mg total) by mouth 2 (two) times daily with a meal. 05/25/18   Clegg, Amy D, NP  cephALEXin (KEFLEX) 500 MG capsule Take 1 capsule (500 mg total) by mouth 2 (two) times daily for 7 days. 06/26/18 07/03/18  Georgetta Haber, NP  diphenhydrAMINE (BENADRYL) 25 MG tablet Take 25 mg by mouth every 6 (six) hours as needed for allergies.     [provider]  diphenhydramine-acetaminophen (TYLENOL PM EXTRA STRENGTH)  25-500 MG TABS tablet Take 2 tablets by mouth at bedtime as needed (sleep).     [provider]  fluconazole (DIFLUCAN) 200 MG tablet Take once today. Take second pill at completion of antibiotics. 06/26/18   Georgetta Haber, NP  guaiFENesin (MUCINEX) 600 MG 12 hr tablet Take 1 tablet (600 mg total) by mouth 2 (two) times daily. Patient taking differently: Take 600 mg by mouth 2 (two) times daily as needed.  01/14/18   Calvert Cantor, MD  hydrALAZINE (APRESOLINE) 50 MG tablet Take 1 tablet (50 mg total) by mouth 3 (three) times daily. 02/24/18 05/25/18  Clegg, Amy D, NP  ibuprofen (ADVIL,MOTRIN) 200 MG tablet Take 800 mg by mouth every 8 (eight) hours as needed for moderate pain.     [provider]  insulin NPH-regular Human (NOVOLIN 70/30) (70-30) 100 UNIT/ML injection Inject 40 Units into the skin 2 (two) times daily. 05/30/17 09/03/18  Burnadette Pop, MD  ipratropium-albuterol (DUONEB) 0.5-2.5 (3) MG/3ML SOLN Take 3 mLs by nebulization every 6 (six) hours as needed. Patient taking differently: Take 3 mLs by nebulization every 6 (six) hours as needed (SOB).  05/30/17 09/03/18  Burnadette Pop, MD  isosorbide mononitrate (IMDUR) 30 MG 24 hr tablet Take 1 tablet (30 mg total) by mouth daily. 05/25/18 08/23/18  Tonye Becket D, NP  Multiple Vitamin (MULTIVITAMIN WITH MINERALS) TABS tablet Take 1 tablet by mouth once a week.     [provider]  sacubitril-valsartan (ENTRESTO) 97-103 MG Take 1 tablet by mouth 2 (two) times daily. 09/03/17   Bensimhon, Bevelyn Buckles, MD  spironolactone (ALDACTONE) 25 MG tablet Take 1 tablet (25 mg total) by mouth daily. 05/25/18   Sherald Hess, NP    Family History Family History  Problem Relation Age of Onset   Stroke Mother    Lung cancer Father     Social History Social History   Tobacco Use   Smoking status: Current Every Day Smoker    Packs/day: 0.50    Types: Cigarettes   Smokeless tobacco: Never Used  Substance Use Topics   Alcohol use: No    Drug use: No     Allergies   Banana and Latex   Review of Systems Review of Systems   Physical Exam Triage Vital Signs ED Triage Vitals  Enc Vitals Group     BP 06/26/18 0813 140/82     Pulse Rate 06/26/18 0813 88     Resp 06/26/18 0813 19     Temp 06/26/18 0813 98.7 F (37.1 C)     Temp Source 06/26/18 0813 Oral     SpO2 06/26/18 0813 97 %     Weight --      Height --  Head Circumference --      Peak Flow --      Pain Score 06/26/18 0811 2     Pain Loc --      Pain Edu? --      Excl. in GC? --    No data found.  Updated Vital Signs BP 140/82 (BP Location: Right Arm)    Pulse 88    Temp 98.7 F (37.1 C) (Oral)    Resp 19    LMP 08/02/2010    SpO2 97%   Visual Acuity Right Eye Distance:   Left Eye Distance:   Bilateral Distance:    Right Eye Near:   Left Eye Near:    Bilateral Near:     Physical Exam Constitutional:      General: She is not in acute distress.    Appearance: She is well-developed.  Cardiovascular:     Rate and Rhythm: Normal rate and regular rhythm.     Heart sounds: Normal heart sounds.  Pulmonary:     Effort: Pulmonary effort is normal.     Breath sounds: Normal breath sounds.  Abdominal:     Palpations: Abdomen is soft. Abdomen is not rigid.     Tenderness: There is no abdominal tenderness. There is no right CVA tenderness, left CVA tenderness, guarding or rebound.  Genitourinary:    Comments: Denies sores, lesions, vaginal bleeding; no pelvic pain; gu exam deferred at this time, vaginal self swab collected.   Skin:    General: Skin is warm and dry.  Neurological:     Mental Status: She is alert and oriented to person, place, and time.      UC Treatments / Results  Labs (all labs ordered are listed, but only abnormal results are displayed) Labs Reviewed  URINE CULTURE  CERVICOVAGINAL ANCILLARY ONLY    EKG None  Radiology No results found.  Procedures Procedures (including critical care time)  Medications  Ordered in UC Medications - No data to display  Initial Impression / Assessment and Plan / UC Course  I have reviewed the triage vital signs and the nursing notes.  Pertinent labs & imaging results that were available during my care of the patient were reviewed by me and considered in my medical decision making (see chart for details).     Urine unable to run in automated dip, culture obtained. Symptoms consistent with UTI, keflex initiated. Diflucan provided as well with vaginal swab collected and pending. Will notify of any positive findings and if any changes to treatment are needed.  Return precautions provided. Patient verbalized understanding and agreeable to plan.   Final Clinical Impressions(s) / UC Diagnoses   Final diagnoses:  Lower urinary tract infectious disease  Acute vaginitis     Discharge Instructions     Complete course of antibiotics.  Drink plenty of water to empty bladder regularly. Avoid alcohol and caffeine as these may irritate the bladder.  Diflucan for yeast- take one tab today and then another at completion of antibiotics.  Monitor your blood sugars as this can increase your risk of yeast.  Will notify you of any positive findings and if any changes to treatment are needed from your vaginal swab.  If symptoms worsen or do not improve in the next week to return to be seen or to follow up with your PCP.      ED Prescriptions    Medication Sig Dispense Auth. Provider   cephALEXin (KEFLEX) 500 MG capsule Take 1 capsule (500 mg  total) by mouth 2 (two) times daily for 7 days. 14 capsule Linus Mako B, NP   fluconazole (DIFLUCAN) 200 MG tablet Take once today. Take second pill at completion of antibiotics. 2 tablet Georgetta Haber, NP     Controlled Substance Prescriptions [place] Controlled Substance Registry consulted? Not Applicable   Georgetta Haber, NP 06/26/18 [phone]

## 2018-06-26 NOTE — ED Triage Notes (Signed)
Patient presents to Urgent Care with complaints of frequent urination, burning with urination, and possible yeast infection since 5 days ago.

## 2018-06-26 NOTE — ED Notes (Signed)
Urinalysis performed, testing exceeded the limitations of the clintek capabilities. Urine culture ordered.

## 2018-06-26 NOTE — Discharge Instructions (Signed)
Complete course of antibiotics.  Drink plenty of water to empty bladder regularly. Avoid alcohol and caffeine as these may irritate the bladder.  Diflucan for yeast- take one tab today and then another at completion of antibiotics.  Monitor your blood sugars as this can increase your risk of yeast.  Will notify you of any positive findings and if any changes to treatment are needed from your vaginal swab.  If symptoms worsen or do not improve in the next week to return to be seen or to follow up with your PCP.

## 2018-06-28 LAB — URINE CULTURE: Culture: >=100000 — AB

## 2018-06-30 ENCOUNTER — Telehealth (HOSPITAL_COMMUNITY): Payer: Self-pay | Admitting: Emergency Medicine

## 2018-06-30 LAB — CERVICOVAGINAL ANCILLARY ONLY
Bacterial vaginitis: POSITIVE — AB
Candida vaginitis: NEGATIVE
Chlamydia: NEGATIVE
Neisseria Gonorrhea: NEGATIVE
Trichomonas: NEGATIVE

## 2018-06-30 MED ORDER — METRONIDAZOLE 500 MG PO TABS: 500.0000 mg | ORAL_TABLET | Freq: Two times a day (BID) | ORAL | 0 refills | Status: AC

## 2018-06-30 NOTE — Telephone Encounter (Signed)
Urine culture was positive for proteus mirabilis and was given  keflex at urgent care visit.   Bacterial vaginosis is positive. This was not treated at the urgent care visit.  Flagyl 500 mg BID x 7 days #14 no refills sent to patients pharmacy of choice.    Attempted to reach patient. No answer at this time. Voicemail left.

## 2018-07-01 ENCOUNTER — Telehealth (HOSPITAL_COMMUNITY): Payer: Self-pay | Admitting: Emergency Medicine

## 2018-07-01 NOTE — Telephone Encounter (Signed)
Patient contacted and made aware of all results, all questions answered.   

## 2018-07-07 ENCOUNTER — Other Ambulatory Visit: Payer: Self-pay

## 2018-07-07 ENCOUNTER — Ambulatory Visit: Payer: Self-pay | Attending: Primary Care | Admitting: Primary Care

## 2018-08-25 ENCOUNTER — Encounter (HOSPITAL_COMMUNITY): Payer: Self-pay

## 2018-08-25 ENCOUNTER — Telehealth (HOSPITAL_COMMUNITY): Payer: Self-pay | Admitting: Pharmacy Technician

## 2018-08-25 ENCOUNTER — Other Ambulatory Visit: Payer: Self-pay

## 2018-08-25 ENCOUNTER — Ambulatory Visit (HOSPITAL_COMMUNITY)
Admission: RE | Admit: 2018-08-25 | Discharge: 2018-08-25 | Disposition: A | Discharge status: Home / Self Care | Payer: Self-pay | Source: Ambulatory Visit | Attending: Cardiology | Admitting: Cardiology

## 2018-08-25 VITALS — BP 142/90 | HR 90 | Wt 206.0 lb

## 2018-08-25 DIAGNOSIS — I110 Hypertensive heart disease with heart failure: Secondary | ICD-10-CM | POA: Insufficient documentation

## 2018-08-25 DIAGNOSIS — R0683 Snoring: Secondary | ICD-10-CM | POA: Insufficient documentation

## 2018-08-25 DIAGNOSIS — M5136 Other intervertebral disc degeneration, lumbar region: Secondary | ICD-10-CM | POA: Insufficient documentation

## 2018-08-25 DIAGNOSIS — Z794 Long term (current) use of insulin: Secondary | ICD-10-CM | POA: Insufficient documentation

## 2018-08-25 DIAGNOSIS — I5022 Chronic systolic (congestive) heart failure: Secondary | ICD-10-CM | POA: Insufficient documentation

## 2018-08-25 DIAGNOSIS — I10 Essential (primary) hypertension: Secondary | ICD-10-CM

## 2018-08-25 DIAGNOSIS — Z91018 Allergy to other foods: Secondary | ICD-10-CM | POA: Insufficient documentation

## 2018-08-25 DIAGNOSIS — Z823 Family history of stroke: Secondary | ICD-10-CM | POA: Insufficient documentation

## 2018-08-25 DIAGNOSIS — F17210 Nicotine dependence, cigarettes, uncomplicated: Secondary | ICD-10-CM | POA: Insufficient documentation

## 2018-08-25 DIAGNOSIS — Z7982 Long term (current) use of aspirin: Secondary | ICD-10-CM | POA: Insufficient documentation

## 2018-08-25 DIAGNOSIS — Z79899 Other long term (current) drug therapy: Secondary | ICD-10-CM | POA: Insufficient documentation

## 2018-08-25 DIAGNOSIS — I2510 Atherosclerotic heart disease of native coronary artery without angina pectoris: Secondary | ICD-10-CM | POA: Insufficient documentation

## 2018-08-25 DIAGNOSIS — Z801 Family history of malignant neoplasm of trachea, bronchus and lung: Secondary | ICD-10-CM | POA: Insufficient documentation

## 2018-08-25 DIAGNOSIS — I428 Other cardiomyopathies: Secondary | ICD-10-CM | POA: Insufficient documentation

## 2018-08-25 DIAGNOSIS — Z91040 Latex allergy status: Secondary | ICD-10-CM | POA: Insufficient documentation

## 2018-08-25 DIAGNOSIS — E119 Type 2 diabetes mellitus without complications: Secondary | ICD-10-CM | POA: Insufficient documentation

## 2018-08-25 DIAGNOSIS — Z720 Tobacco use: Secondary | ICD-10-CM

## 2018-08-25 DIAGNOSIS — J449 Chronic obstructive pulmonary disease, unspecified: Secondary | ICD-10-CM | POA: Insufficient documentation

## 2018-08-25 LAB — BASIC METABOLIC PANEL
Anion gap: 10 (ref 5–15)
BUN: 14 mg/dL (ref 6–20)
CO2: 26 mmol/L (ref 22–32)
Calcium: 8.8 mg/dL — ABNORMAL LOW (ref 8.9–10.3)
Chloride: 102 mmol/L (ref 98–111)
Creatinine, Ser: 0.64 mg/dL (ref 0.44–1.00)
GFR calc Af Amer: >60 mL/min (ref >60)
GFR calc non Af Amer: >60 mL/min (ref >60)
Glucose, Bld: 198 mg/dL — ABNORMAL HIGH (ref 70–99)
Potassium: 4 mmol/L (ref 3.5–5.1)
Sodium: 138 mmol/L (ref 135–145)

## 2018-08-25 MED ORDER — SPIRONOLACTONE 25 MG PO TABS: 25.0000 mg | ORAL_TABLET | Freq: Every day | ORAL | 1 refills | Status: DC

## 2018-08-25 MED ORDER — ISOSORBIDE MONONITRATE ER 30 MG PO TB24: 30.0000 mg | ORAL_TABLET | Freq: Every day | ORAL | 1 refills | Status: DC

## 2018-08-25 MED ORDER — HYDRALAZINE HCL 50 MG PO TABS: 50.0000 mg | ORAL_TABLET | Freq: Three times a day (TID) | ORAL | 6 refills | Status: DC

## 2018-08-25 MED ORDER — FUROSEMIDE 20 MG PO TABS: 20.0000 mg | ORAL_TABLET | Freq: Every day | ORAL | 3 refills | Status: DC

## 2018-08-25 MED ORDER — CARVEDILOL 6.25 MG PO TABS: 6.2500 mg | ORAL_TABLET | Freq: Two times a day (BID) | ORAL | 1 refills | Status: DC

## 2018-08-25 MED FILL — SPIRONOLACTONE 25 MG TABLET: 25 | 30 days supply | Qty: 30 | Fill #0

## 2018-08-25 MED FILL — hydrALAZINE HCL 50 MG TABS: 50 | 30 days supply | Qty: 90 | Fill #0

## 2018-08-25 MED FILL — CARVEDILOL 6.25 MG TABLET: 6.25 | 30 days supply | Qty: 60 | Fill #0

## 2018-08-25 MED FILL — ISOSORBIDE MN ER 30 MG TAB: 30 | 30 days supply | Qty: 30 | Fill #0

## 2018-08-25 MED FILL — FUROSEMIDE 20 MG TABS: 20 | 30 days supply | Qty: 30 | Fill #0

## 2018-08-25 NOTE — Progress Notes (Signed)
[facility] Note   PCP: Grayce SessionsEdwards, Michelle P, NP PCP-Cardiologist: Dr Gala RomneyBensimhon  HPI: Felicia [name] is a 58 y.o. female with h/o of obesity, tobacco abuse, COPD, probable OSA, IDDM, HTN, NICM.  Admitted 5/7-5/10/19 with SOB and wheezing. Echo showed newly reduced EF 20-25% (previously normal 2013, but reportedly low in 2012). HF team was consulted. Underwent R/LHC that showed nonobstructive CAD and well compensated hemodynamics. She was also treated for COPD exacerbation with steroids and nebulizers. Diuresed with IV lasix. DC weight: 203 lbs.  Today she returns for HF follow up with her son. Overall feeling fine. Not moving around much at home. Says she has to walk slow in the grocery store. SOB with steps.  Denies PND/Orthopnea. No chest pain. Appetite ok. No fever or chills. Weight at home 206 pounds. Taking all medications. Smoking 5-6 cigarettes per day. She requires assistance with transportation and has difficulty paying for medications.  She has no insurance.  Echo 08/2017 EF 40-45% RV normal   SH: Lives at home with daughters, manages her own medications, daughters drive her to appointments.  No ETOH or drugs.   [facility]  Prox LAD lesion is 20% stenosed.  Mid Cx lesion is 40% stenosed. Ao = 132/80 (100) LV = 127/19 RA = 7 RV = 38/8 PA = 39/12 (26) PCW = 14 Fick cardiac output/index = 5.3/2.9 PVR = 2.3 WU SVR = 1408 Ao sat = 93% PA sat = 66%, 68%  Assessment: 1. Co-dominant coronary system with separate ostial for LAD and LCx 2. Minimal non-obstructive CAD 3. NICM EF 25-30%  4. Well-compensated hemodynamics  Echo5/8/19LVEF 25-30%, Trivial MR, Mild TR, PA peak pressure 40 mm Hg  Past Medical History:  Diagnosis Date  . Cardiomyopathy   . CHF (congestive heart failure) (HCC)    minamal  . COPD (chronic obstructive pulmonary disease) (HCC)   . DDD (degenerative disc disease), lumbar   . Diabetes mellitus   . Hypertension   . Non  compliance w medication regimen   . Normal echocardiogram    LVEF 25-30% 03/27/2010    Current Outpatient Medications  Medication Sig Dispense Refill  . acetaminophen (TYLENOL) 500 MG tablet Take 1,500 mg by mouth daily as needed for mild pain or headache.    . albuterol (PROVENTIL HFA;VENTOLIN HFA) 108 (90 Base) MCG/ACT inhaler Inhale 2 puffs into the lungs every 6 (six) hours as needed for wheezing or shortness of breath. 1 Inhaler 0  . aspirin 81 MG chewable tablet Chew 81 mg by mouth daily.    Marland Kitchen. bismuth subsalicylate (PEPTO BISMOL) 262 MG/15ML suspension Take 30 mLs by mouth every 4 (four) hours as needed for indigestion.     . carvedilol (COREG) 6.25 MG tablet Take 1 tablet (6.25 mg total) by mouth 2 (two) times daily with a meal. 180 tablet 1  . diphenhydrAMINE (BENADRYL) 25 MG tablet Take 25 mg by mouth every 6 (six) hours as needed for allergies.     . diphenhydramine-acetaminophen (TYLENOL PM EXTRA STRENGTH) 25-500 MG TABS tablet Take 2 tablets by mouth at bedtime as needed (sleep).     Marland Kitchen. guaiFENesin (MUCINEX) 600 MG 12 hr tablet Take 1 tablet (600 mg total) by mouth 2 (two) times daily. (Patient taking differently: Take 600 mg by mouth 2 (two) times daily as needed. ) 30 tablet 0  . hydrALAZINE (APRESOLINE) 50 MG tablet Take 1 tablet (50 mg total) by mouth 3 (three) times daily. 90 tablet 6  . ibuprofen (ADVIL,MOTRIN)  200 MG tablet Take 800 mg by mouth every 8 (eight) hours as needed for moderate pain.     Marland Kitchen insulin NPH-regular Human (NOVOLIN 70/30) (70-30) 100 UNIT/ML injection Inject 40 Units into the skin 2 (two) times daily. 24 mL 0  . ipratropium-albuterol (DUONEB) 0.5-2.5 (3) MG/3ML SOLN Take 3 mLs by nebulization every 6 (six) hours as needed. (Patient taking differently: Take 3 mLs by nebulization every 6 (six) hours as needed (SOB). ) 360 mL 0  . isosorbide mononitrate (IMDUR) 30 MG 24 hr tablet Take 1 tablet (30 mg total) by mouth daily. 90 tablet 1  . Multiple Vitamin  (MULTIVITAMIN WITH MINERALS) TABS tablet Take 1 tablet by mouth once a week.     . sacubitril-valsartan (ENTRESTO) 97-103 MG Take 1 tablet by mouth 2 (two) times daily. 180 tablet 3  . spironolactone (ALDACTONE) 25 MG tablet Take 1 tablet (25 mg total) by mouth daily. 90 tablet 1   No current facility-administered medications for this encounter.     Allergies  Allergen Reactions  . Banana Itching  . Latex Itching      Social History   Socioeconomic History  . Marital status: Single    Spouse name: Not on file  . Number of children: 2  . Years of education: Not on file  . Highest education level: Not on file  Occupational History  . Not on file  Social Needs  . Financial resource strain: Not on file  . Food insecurity    Worry: Not on file    Inability: Not on file  . Transportation needs    Medical: Not on file    Non-medical: Not on file  Tobacco Use  . Smoking status: Current Every Day Smoker    Packs/day: 0.50    Types: Cigarettes  . Smokeless tobacco: Never Used  Substance and Sexual Activity  . Alcohol use: No  . Drug use: No  . Sexual activity: Yes    Birth control/protection: Surgical, Post-menopausal  Lifestyle  . Physical activity    Days per week: Not on file    Minutes per session: Not on file  . Stress: Not on file  Relationships  . Social Herbalist on phone: Not on file    Gets together: Not on file    Attends religious service: Not on file    Active member of club or organization: Not on file    Attends meetings of clubs or organizations: Not on file    Relationship status: Not on file  . Intimate partner violence    Fear of current or ex partner: Not on file    Emotionally abused: Not on file    Physically abused: Not on file    Forced sexual activity: Not on file  Other Topics Concern  . Not on file  Social History Narrative  . Not on file      Family History  Problem Relation Age of Onset  . Stroke Mother   . Lung  cancer Father     Vitals:   08/25/18 1053  BP: (!) 142/90  Pulse: 90  SpO2: 96%  Weight: 93.4 kg (206 lb)   Wt Readings from Last 3 Encounters:  08/25/18 93.4 kg (206 lb)  02/24/18 90.2 kg (198 lb 12.8 oz)  01/13/18 88.6 kg (195 lb 5.2 oz)    PHYSICAL EXAM: General:  Well appearing. No resp difficulty HEENT: normal Neck: supple. JVP 8-9 . Carotids 2+ bilat; no bruits. No  lymphadenopathy or thryomegaly appreciated. Cor: PMI nondisplaced. Regular rate & rhythm. No rubs, gallops or murmurs. Lungs: clear Abdomen: soft, nontender, nondistended. No hepatosplenomegaly. No bruits or masses. Good bowel sounds. Extremities: no cyanosis, clubbing, rash, edema Neuro: alert & orientedx3, cranial nerves grossly intact. moves all 4 extremities w/o difficulty. Affect pleasant  ASSESSMENT & PLAN:  1. Chronic systolic CHF - History of NICM. Echo in 2013 with normal EF. Suspect HTN cardiomyopathy (vs OSA) -Echo5/8/19LVEF 25-30%, Trivial MR, Mild TR, PA peak pressure 40 mm Hg - R/LHC 05/29/17 with well compensated hemodynamics and minimal, non-obstructive CAD.   EF 08/2017 40-45%   -NYHA IIIb. Volume status mildly elevated. Add 20 mg po lasix daily.   - Continue coreg 6.25 mg BID. - Continue Entresto to 97/103 BID.  -Continue spiro 25 mg daily.  - Continue hydralazine to 50 mg tid. Continue imdur 30 mg daily. - Check BMET   2. COPD - Discussed smoking cessation.  3. Tobacco Abuse -Discussed smoking cessation.  4. IDDM - HgB A1c 8.6 05/28/17 - Consider Jardiance. Encouraged [facility] to [facility] to [facility] PCP 5. HTN -Just took medications. Continue current regimen.  6. Snores Needs to hold off on sleep study until she gets insurance.   Refer to HFSW for assistance with medications and transportation. Will also see if she can get insurance. We can provide HF meds through HF fund.   Follow up in 3 months.  Tonye Becket, NP 08/25/18

## 2018-08-25 NOTE — Addendum Note (Signed)
Encounter addended by: Jorge Ny, LCSW on: 08/25/2018 11:57 AM  Actions taken: Clinical Note Signed

## 2018-08-25 NOTE — Patient Instructions (Addendum)
Lab work done today. We will notify you of any abnormal lab work. No news is good news.  You have been referred to our heart failure social worker.  START Furosemide 20mg  (1 tab) daily.  Please follow up with the [facility] in 3 months.  At the [facility], you and your health needs are our priority. As part of our continuing mission to provide you with exceptional heart care, we have created designated Provider Care Teams. These Care Teams include your primary Cardiologist (physician) and Advanced Practice Providers (APPs- Physician Assistants and Nurse Practitioners) who all work together to provide you with the care you need, when you need it.   You may see any of the following providers on your designated Care Team at your next follow up: Marland Kitchen Dr Glori Bickers . Dr Loralie Champagne . Darrick Grinder, NP   Please be sure to bring in all your medications bottles to every appointment.     Smoking Tobacco Information, Adult Smoking tobacco can be harmful to your health. Tobacco contains a poisonous (toxic), colorless chemical called nicotine. Nicotine is addictive. It changes the brain and can make it hard to stop smoking. Tobacco also has other toxic chemicals that can hurt your body and raise your risk of many cancers. How can smoking tobacco affect me? Smoking tobacco puts you at risk for:  Cancer. Smoking is most commonly associated with lung cancer, but can also lead to cancer in other parts of the body.  Chronic obstructive pulmonary disease (COPD). This is a long-term lung condition that makes it hard to breathe. It also gets worse over time.  High blood pressure (hypertension), heart disease, stroke, or heart attack.  Lung infections, such as pneumonia.  Cataracts. This is when the lenses in the eyes become clouded.  Digestive problems. This may include peptic ulcers, heartburn, and gastroesophageal reflux disease (GERD).  Oral health problems,  such as gum disease and tooth loss.  Loss of taste and smell. Smoking can affect your appearance by causing:  Wrinkles.  Yellow or stained teeth, fingers, and fingernails. Smoking tobacco can also affect your social life, because:  It may be challenging to find places to smoke when away from home. Many workplaces, [employer], hotels, and public places are tobacco-free.  Smoking is expensive. This is due to the cost of tobacco and the long-term costs of treating health problems from smoking.  Secondhand smoke may affect those around you. Secondhand smoke can cause lung cancer, breathing problems, and heart disease. Children of smokers have a higher risk for: ? Sudden infant death syndrome (SIDS). ? Ear infections. ? Lung infections. If you currently smoke tobacco, quitting now can help you:  Lead a longer and healthier life.  Look, smell, breathe, and feel better over time.  Save money.  Protect others from the harms of secondhand smoke. What actions can I take to prevent health problems? Quit smoking   Do not start smoking. Quit if you already do.  Make a plan to quit smoking and commit to it. Look for programs to help you and ask your health care provider for recommendations and ideas.  Set a date and write down all the reasons you want to quit.  Let your friends and family know you are quitting so they can help and support you. Consider finding friends who also want to quit. It can be easier to quit with someone else, so that you can support each other.  Talk with your health  care provider about using nicotine replacement medicines to help you quit, such as gum, lozenges, patches, sprays, or pills.  Do not replace cigarette smoking with electronic cigarettes, which are commonly called e-cigarettes. The safety of e-cigarettes is not known, and some may contain harmful chemicals.  If you try to quit but return to smoking, stay positive. It is common to slip up when you  first quit, so take it one day at a time.  Be prepared for cravings. When you feel the urge to smoke, chew gum or suck on hard candy. Lifestyle  Stay busy and take care of your body.  Drink enough fluid to keep your urine pale yellow.  Get plenty of exercise and eat a healthy diet. This can help prevent weight gain after quitting.  Monitor your eating habits. Quitting smoking can cause you to have a larger appetite than when you smoke.  Find ways to relax. Go out with friends or family to a movie or a restaurant where people do not smoke.  Ask your health care provider about having regular tests (screenings) to check for cancer. This may include blood tests, imaging tests, and other tests.  Find ways to manage your stress, such as meditation, yoga, or exercise. Where to find support To get support to quit smoking, consider:  Asking your health care provider for more information and resources.  Taking classes to learn more about quitting smoking.  Looking for local organizations that offer resources about quitting smoking.  Joining a support group for people who want to quit smoking in your local community.  Calling the smokefree.gov counselor helpline: 1-800-Quit-Now 626-393-2167) Where to find more information You may find more information about quitting smoking from:  HelpGuide.org: www.helpguide.org  BankRights[employer]: smokefree.gov  American Lung Association: www.lung.org Contact a health care provider if you:  Have problems breathing.  Notice that your lips, nose, or fingers turn blue.  Have chest pain.  Are coughing up blood.  Feel faint or you pass out.  Have other health changes that cause you to worry. Summary  Smoking tobacco can negatively affect your health, the health of those around you, your finances, and your social life.  Do not start smoking. Quit if you already do. If you need help quitting, ask your health care provider.  Think about joining  a support group for people who want to quit smoking in your local community. There are many effective programs that will help you to quit this behavior. This information is not intended to replace advice given to you by your health care provider. Make sure you discuss any questions you have with your health care provider. Document Released: 01/23/2016 Document Revised: 02/26/2017 Document Reviewed: 01/23/2016 Elsevier Patient Education  2020 [employer].

## 2018-08-25 NOTE — Addendum Note (Signed)
Encounter addended by: Scarlette Calico, RN on: 08/25/2018 11:36 AM  Actions taken: Clinical Note Signed

## 2018-08-25 NOTE — Progress Notes (Signed)
CSW consulted to speak with patient regarding insurance concerns, medication cost concerns, and smoking cessation.  CSW had spoken with patient in May regarding need to apply for disability/medicaid.  Patient states she applied about 2 months ago and has not heard anything regarding her case at this time.  Pt son, Saralyn Pilar, with patient reports that they were sent a notice but could not remember what it said.  CSW encouraged them to look for this notice and to provide CSW with case worker's number if there is a case worker assigned so I could help get them necessary documentation.  CSW then spoke with patient regarding medication assistance.  Patient and son already informed about heart failure fund and will be getting some of pt medications through this fund following her visit.  Other than heart medications patient states the only other medication that she has trouble affording is insulin at $24.  No identified additional help available for this medication at this time.  CSW had set up patient with [facility] appt back in May but patient had not shown to past 2 appts so has been unable to get set up with them and inquire about blue card.  CSW confirmed that pt is aware of upcoming appt with them on 9/4 and pt states she will make sure she goes.  Pt provided with smoking cessation hotline information by RN.  CSW will continue to follow and assist as needed.  Jorge Ny, LCSW Clinical Social Worker [facility] Clinic Desk#: [phone] Cell#: [phone]

## 2018-08-25 NOTE — Telephone Encounter (Signed)
Oral Oncology Patient Advocate Encounter  Completed an application for [employer] [place] ([employer]) in an effort to reduce the patient's out of pocket expense for Entresto to $0.    Application completed and faxed to [phone].   [employer] phone number for follow up is [phone].   This encounter will be updated until final determination.   Charlann Boxer, CPhT

## 2018-08-28 NOTE — Telephone Encounter (Signed)
Patient was approved to receive Entresto through [employer].  ID: [id number]  Dates: 08/26/2018 - 09/11/2019.  Called [employer] to check shipment and they have not been able to get in touch with the patient. Called patient and spoke with her and gave her the number to [employer] to call so she can receive her Entresto.  Number is [phone]  Charlann Boxer, CPhT

## 2018-09-09 ENCOUNTER — Telehealth (HOSPITAL_COMMUNITY): Payer: Self-pay | Admitting: Pharmacy Technician

## 2018-09-15 NOTE — Telephone Encounter (Signed)
Called [employer] to verify if they were successful in reaching the patient to set up shipment on her Entresto. They were able to get the patient on the phone and recently sent out a 3 month supply.   Charlann Boxer, CPhT

## 2018-09-25 ENCOUNTER — Ambulatory Visit: Payer: Self-pay | Admitting: Nurse Practitioner

## 2018-10-06 MED FILL — FUROSEMIDE 20 MG TABS: 20 | 30 days supply | Qty: 30 | Fill #1

## 2018-10-06 MED FILL — SPIRONOLACTONE 25 MG TABLET: 25 | 30 days supply | Qty: 30 | Fill #1

## 2018-10-06 MED FILL — ISOSORBIDE MN ER 30 MG TAB: 30 | 30 days supply | Qty: 30 | Fill #1

## 2018-10-06 MED FILL — hydrALAZINE HCL 50 MG TABS: 50 | 30 days supply | Qty: 90 | Fill #1

## 2018-11-02 ENCOUNTER — Ambulatory Visit: Payer: Self-pay | Admitting: Family Medicine

## 2018-11-20 MED FILL — ISOSORBIDE MN ER 30 MG TAB: 30 | 30 days supply | Qty: 30 | Fill #2

## 2018-11-20 MED FILL — hydrALAZINE HCL 50 MG TABS: 50 | 30 days supply | Qty: 90 | Fill #2

## 2018-11-20 MED FILL — FUROSEMIDE 20 MG TABS: 20 | 30 days supply | Qty: 30 | Fill #2

## 2018-11-20 MED FILL — CARVEDILOL 6.25 MG TABLET: 6.25 | 30 days supply | Qty: 60 | Fill #1

## 2018-11-20 MED FILL — SPIRONOLACTONE 25 MG TABLET: 25 | 30 days supply | Qty: 30 | Fill #2

## 2018-11-25 ENCOUNTER — Encounter (HOSPITAL_COMMUNITY): Payer: Self-pay

## 2018-12-03 ENCOUNTER — Ambulatory Visit: Payer: Self-pay | Attending: Nurse Practitioner | Admitting: Family Medicine

## 2018-12-03 ENCOUNTER — Other Ambulatory Visit: Payer: Self-pay

## 2018-12-03 ENCOUNTER — Telehealth: Payer: Self-pay

## 2018-12-03 ENCOUNTER — Encounter: Payer: Self-pay | Admitting: Family Medicine

## 2018-12-03 DIAGNOSIS — E11311 Type 2 diabetes mellitus with unspecified diabetic retinopathy with macular edema: Secondary | ICD-10-CM

## 2018-12-03 DIAGNOSIS — J449 Chronic obstructive pulmonary disease, unspecified: Secondary | ICD-10-CM

## 2018-12-03 DIAGNOSIS — Z720 Tobacco use: Secondary | ICD-10-CM

## 2018-12-03 DIAGNOSIS — Z794 Long term (current) use of insulin: Secondary | ICD-10-CM

## 2018-12-03 DIAGNOSIS — Z79899 Other long term (current) drug therapy: Secondary | ICD-10-CM

## 2018-12-03 DIAGNOSIS — G479 Sleep disorder, unspecified: Secondary | ICD-10-CM

## 2018-12-03 DIAGNOSIS — I5022 Chronic systolic (congestive) heart failure: Secondary | ICD-10-CM

## 2018-12-03 DIAGNOSIS — I10 Essential (primary) hypertension: Secondary | ICD-10-CM

## 2018-12-03 MED ORDER — TRUEPLUS LANCETS 28G MISC: 11 refills | Status: DC

## 2018-12-03 MED ORDER — TRUE METRIX BLOOD GLUCOSE TEST VI STRP: ORAL_STRIP | 12 refills | Status: DC

## 2018-12-03 MED ORDER — TRUE METRIX METER W/DEVICE KIT: PACK | 0 refills | Status: DC

## 2018-12-03 NOTE — Addendum Note (Signed)
Addended by: Kathrene Bongo on: 12/03/2018 11:01 AM   Modules accepted: Orders

## 2018-12-03 NOTE — Progress Notes (Signed)
Virtual Visit via Telephone Note  I connected with Felicia [name] on 12/03/18 at  8:50 AM EST by telephone and verified that I am speaking with the correct person using two identifiers.   I discussed the limitations, risks, security and privacy concerns of performing an evaluation and management service by telephone and the availability of in person appointments. I also discussed with the patient that there may be a patient responsible charge related to this service. The patient expressed understanding and agreed to proceed.  Patient Location: Home Provider Location: Home Office Others participating in call: call initiated by [place] who then transferred the call to me.   History of Present Illness:          58 year old female, who is technically new to the practice and is on review of chart, she has missed at least 4 prior appointments to establish care.  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  Patient states that overall she feels well at today's visit.  She reports no current cough and no increased shortness of breath.  She does endorse some mild shortness of breath with exertion.  She also continues to smoke about 5 cigarettes/day.  She does not have a glucometer and therefore does not check her home blood sugars.  She reports occasional symptoms of feeling shaky/jittery or weak when she goes too long between meals.  She denies any current issues with increased urinary frequency or increased thirst.  She denies any numbness or tingling in the hands or feet.  She believes that her blood pressure has been controlled and she has had no headaches  or dizziness related to her blood pressure.  She denies any increase in peripheral edema and reports she is compliant with the use of her fluid pills.  She denies any chest pain or palpitations, no abdominal pain-no nausea/vomiting/diarrhea or constipation.  She does report daytime fatigue and nonrestful sleep.  She does not need any current medication refills of which she is aware.  She states that she has appointment here in the office tomorrow to receive her influenza immunization.   Past Medical History:  Diagnosis Date  . Cardiomyopathy   . CHF (congestive heart failure) (HCC)    minamal  . COPD (chronic obstructive pulmonary disease) (HCC)   . DDD (degenerative disc disease), lumbar   . Diabetes mellitus   . Hypertension   . Non compliance w medication regimen   . Normal echocardiogram    LVEF 25-30% 03/27/2010    Past Surgical History:  Procedure Laterality Date  . CARDIAC CATHETERIZATION    . RIGHT/LEFT HEART CATH AND CORONARY ANGIOGRAPHY N/A 05/29/2017   Procedure: RIGHT/LEFT HEART CATH AND CORONARY ANGIOGRAPHY;  Surgeon: Dolores Patty, MD;  Location: [facility];  Service: Cardiovascular;  Laterality: N/A;  . TUBAL LIGATION      Family History  Problem Relation Age of Onset  . Stroke Mother   . Lung cancer Father     Social History   Tobacco Use  . Smoking status: Current Every Day Smoker    Packs/day: 0.50    Types: Cigarettes  . Smokeless tobacco: Never Used  Substance Use Topics  . Alcohol  use: No  . Drug use: No     Allergies  Allergen Reactions  . Banana Itching  . Latex Itching       Observations/Objective: No vital signs or physical exam conducted as visit was done via telephone  Assessment and Plan: 1. 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  Patient's last hemoglobin A1c appears to have been done on May 28, 2017 and at that time was 8.6.  Patient will have hemoglobin A1c, urine microalbumin, comprehensive metabolic panel, lipid panel when she comes in tomorrow for blood work.  She does not appear to currently be on statin therapy and after tomorrow's lipid panel she will be contacted regarding the need for statin therapy due to history of mild nonobstructive CAD and to help reduce risk of heart disease associated with diabetes.  Per past notes, patient with history of retinopathy and she will be referred to ophthalmology.  She does not currently have a glucometer and prescriptions will be sent to this pharmacy for glucometer, strips and lancets so that patient can check her blood sugars at home.  She reports that she has used glucometer in the past.  Referral is being placed to social work to help patient with application process for financial assistance through this office.   - Ambulatory referral to Ophthalmology - Ambulatory referral to Social Work  2. Chronic obstructive pulmonary disease, unspecified COPD type ([place]); tobacco use Patient with COPD which she states is controlled at this time.  She reports no current increase in cough or shortness of breath.  She does continue to smoke about 5 cigarettes/day and discussed with patient's the importance of complete smoking cessation.  She reports that she is using nicotine patches and I discussed with patient the need to set a quit date for complete smoking cessation.  3. Chronic systolic heart failure ([place]) Patient's most recent cardiology note reviewed.  Patient was seen 08/25/2018 by Dr. Aundra Dubin in follow-up of her hypertension, CHF, nonobstructive CAD.  She will continue her current medications.  Cardiology also felt that patient would benefit from sleep study due to suspected obstructive  sleep apnea based on patient's symptoms and body habitus.  Patient will be scheduled for sleep study and she will be notified by RN case manager regarding sleep study. - Split night study; Future  4. Essential hypertension She reports that her blood pressure to the best of her knowledge is controlled on her current medications.  At her cardiology visit on 08/25/2018 patient with blood pressure of 142/90.  She will continue her current medications as well as low-sodium diet.  She is also being referred for sleep study as her cardiologist feels that sleep apnea may also be contributing to her hypertension and heart failure. - Split night study; Future  5. Encounter for long-term (current) use of medications Patient will have comprehensive metabolic panel at her lab visit tomorrow when she comes to have influenza immunization.  This is being done in follow-up of long-term use of multiple medications for treatment of diabetes, CHF, COPD and hypertension.  6. Sleep disorder Patient with sleep disorder not otherwise specified as well as obesity, CHF and hypertension.  She will be referred for scheduling of sleep study as she likely  has obstructive sleep apnea contributing to her overall medical issues. - Split night study; Future  Follow Up Instructions:Return in about 4 months (around 04/02/2019) for Diabetes/chronic issues: Blood work tomorrow; sooner follow-up if needed.    I discussed the assessment and treatment plan with the patient. The patient was provided an opportunity to ask questions and all were answered. The patient agreed with the plan and demonstrated an understanding of the instructions.   The patient was advised to call back or seek an in-person evaluation if the symptoms worsen or if the condition fails to improve as anticipated.  I provided 14 minutes of non-face-to-face time during this encounter.   Cain Saupe, MD

## 2018-12-03 NOTE — Telephone Encounter (Signed)
Call placed to patient at request of Dr Chapman Fitch and explained to her that her cardiologist recommended a sleep study and Dr Chapman Fitch has placed the order.  The sleep center will be contacting her to schedule the study. They will also explain the process for applying for  financial assistance.  She was very appreciative of the call.

## 2018-12-04 ENCOUNTER — Ambulatory Visit: Payer: Self-pay | Admitting: Pharmacist

## 2018-12-07 ENCOUNTER — Institutional Professional Consult (permissible substitution): Payer: Self-pay | Admitting: Licensed Clinical Social Worker

## 2018-12-07 ENCOUNTER — Ambulatory Visit: Payer: Self-pay | Admitting: Pharmacist

## 2018-12-10 ENCOUNTER — Other Ambulatory Visit: Payer: Self-pay

## 2018-12-10 ENCOUNTER — Ambulatory Visit: Payer: Self-pay | Attending: Family Medicine | Admitting: Pharmacist

## 2018-12-10 DIAGNOSIS — Z23 Encounter for immunization: Secondary | ICD-10-CM

## 2018-12-10 MED FILL — TRUE METRIX TEST STRIP: 33 days supply | Qty: 100 | Fill #0

## 2018-12-10 MED FILL — TRUEplus LANCETS 28G MISC: 33 days supply | Qty: 100 | Fill #0

## 2018-12-10 MED FILL — !TRUE METRIX BLOOD GLUCOSE: 1 days supply | Qty: 1 | Fill #0

## 2018-12-10 NOTE — Progress Notes (Signed)
Patient presents for vaccination against influenza per orders of Dr. Fulp. Consent given. Counseling provided. No contraindications exists. Vaccine administered without incident.   

## 2018-12-23 ENCOUNTER — Telehealth (HOSPITAL_COMMUNITY): Payer: Self-pay

## 2018-12-23 NOTE — Telephone Encounter (Signed)
Attempted to contact PT to schedule appt since last appt was cancelled due to clinic being closed...no answer and PT's v/m is not set up so unable to leave a message.

## 2018-12-29 ENCOUNTER — Encounter (HOSPITAL_COMMUNITY): Payer: Self-pay

## 2019-01-04 ENCOUNTER — Encounter (HOSPITAL_COMMUNITY): Payer: Self-pay

## 2019-01-04 ENCOUNTER — Other Ambulatory Visit: Payer: Self-pay

## 2019-01-04 ENCOUNTER — Ambulatory Visit (HOSPITAL_COMMUNITY)
Admission: RE | Admit: 2019-01-04 | Discharge: 2019-01-04 | Disposition: A | Discharge status: Home / Self Care | Payer: Self-pay | Source: Ambulatory Visit | Attending: Adult Health | Admitting: Adult Health

## 2019-01-04 VITALS — BP 138/82 | HR 94 | Wt 199.6 lb

## 2019-01-04 DIAGNOSIS — Z794 Long term (current) use of insulin: Secondary | ICD-10-CM | POA: Insufficient documentation

## 2019-01-04 DIAGNOSIS — I10 Essential (primary) hypertension: Secondary | ICD-10-CM

## 2019-01-04 DIAGNOSIS — Z7982 Long term (current) use of aspirin: Secondary | ICD-10-CM | POA: Insufficient documentation

## 2019-01-04 DIAGNOSIS — I428 Other cardiomyopathies: Secondary | ICD-10-CM

## 2019-01-04 DIAGNOSIS — E669 Obesity, unspecified: Secondary | ICD-10-CM | POA: Insufficient documentation

## 2019-01-04 DIAGNOSIS — I5022 Chronic systolic (congestive) heart failure: Secondary | ICD-10-CM

## 2019-01-04 DIAGNOSIS — E119 Type 2 diabetes mellitus without complications: Secondary | ICD-10-CM | POA: Insufficient documentation

## 2019-01-04 DIAGNOSIS — I110 Hypertensive heart disease with heart failure: Secondary | ICD-10-CM | POA: Insufficient documentation

## 2019-01-04 DIAGNOSIS — Z79899 Other long term (current) drug therapy: Secondary | ICD-10-CM | POA: Insufficient documentation

## 2019-01-04 DIAGNOSIS — F17210 Nicotine dependence, cigarettes, uncomplicated: Secondary | ICD-10-CM | POA: Insufficient documentation

## 2019-01-04 DIAGNOSIS — I2510 Atherosclerotic heart disease of native coronary artery without angina pectoris: Secondary | ICD-10-CM | POA: Insufficient documentation

## 2019-01-04 DIAGNOSIS — Z720 Tobacco use: Secondary | ICD-10-CM

## 2019-01-04 DIAGNOSIS — R0683 Snoring: Secondary | ICD-10-CM

## 2019-01-04 LAB — BASIC METABOLIC PANEL
Anion gap: 11 (ref 5–15)
BUN: 17 mg/dL (ref 6–20)
CO2: 27 mmol/L (ref 22–32)
Calcium: 9.6 mg/dL (ref 8.9–10.3)
Chloride: 100 mmol/L (ref 98–111)
Creatinine, Ser: 0.94 mg/dL (ref 0.44–1.00)
GFR calc Af Amer: >60 mL/min (ref >60)
GFR calc non Af Amer: >60 mL/min (ref >60)
Glucose, Bld: 264 mg/dL — ABNORMAL HIGH (ref 70–99)
Potassium: 4.2 mmol/L (ref 3.5–5.1)
Sodium: 138 mmol/L (ref 135–145)

## 2019-01-04 MED ORDER — IPRATROPIUM-ALBUTEROL 0.5-2.5 (3) MG/3ML IN SOLN: 3.0000 mL | Freq: Four times a day (QID) | RESPIRATORY_TRACT | 1 refills | Status: DC | PRN

## 2019-01-04 MED FILL — FUROSEMIDE 20 MG TABS: 20 | 30 days supply | Qty: 30 | Fill #3

## 2019-01-04 MED FILL — ISOSORBIDE MN ER 30 MG TAB: 30 | 30 days supply | Qty: 30 | Fill #3

## 2019-01-04 MED FILL — CARVEDILOL 6.25 MG TABLET: 6.25 | 30 days supply | Qty: 60 | Fill #2

## 2019-01-04 MED FILL — SPIRONOLACTONE 25 MG TABS: 25 | 30 days supply | Qty: 30 | Fill #3

## 2019-01-04 NOTE — Progress Notes (Signed)
Advanced Heart Failure Clinic Note   PCP: Antony Blackbird, MD PCP-Cardiologist: Dr Haroldine Laws  HPI: Felicia [name] is a 58 y.o. female with h/o of obesity, tobacco abuse, COPD, probable OSA, IDDM, HTN, NICM.  Admitted 5/7-5/10/19 with SOB and wheezing. Echo showed newly reduced EF 20-25% (previously normal 2013, but reportedly low in 2012). HF team was consulted. Underwent R/LHC that showed nonobstructive CAD and well compensated hemodynamics. She was also treated for COPD exacerbation with steroids and nebulizers. Diuresed with IV lasix. DC weight: 203 lbs.  Today she returns for HF follow up.Overall feeling fine. Says she is not leaving home much.  SOB with steps. Denies PND/Orthopnea. Appetite ok. No fever or chills. Weight at home down to 199 pounds. Smoking 3 cigarettes per day. Taking all medications.  Echo 08/2017 EF 40-45% RV normal   SH: Lives at home with daughters, manages her own medications, daughters drive her to appointments.  No ETOH or drugs.   [facility] 05/29/17  Prox LAD lesion is 20% stenosed.  Mid Cx lesion is 40% stenosed. Ao = 132/80 (100) LV = 127/19 RA = 7 RV = 38/8 PA = 39/12 (26) PCW = 14 Fick cardiac output/index = 5.3/2.9 PVR = 2.3 WU SVR = 1408 Ao sat = 93% PA sat = 66%, 68% Assessment: 1. Co-dominant coronary system with separate ostial for LAD and LCx 2. Minimal non-obstructive CAD 3. NICM EF 25-30%  4. Well-compensated hemodynamics  Echo5/8/19LVEF 25-30%, Trivial MR, Mild TR, PA peak pressure 40 mm Hg  Past Medical History:  Diagnosis Date  . Cardiomyopathy   . CHF (congestive heart failure) (HCC)    minamal  . COPD (chronic obstructive pulmonary disease) ([place])   . DDD (degenerative disc disease), lumbar   . Diabetes mellitus   . Hypertension   . Non compliance w medication regimen   . Normal echocardiogram    LVEF 25-30% 03/27/2010    Current Outpatient Medications  Medication Sig Dispense Refill  . acetaminophen (TYLENOL) 500 MG  tablet Take 1,500 mg by mouth daily as needed for mild pain or headache.    . albuterol (PROVENTIL HFA;VENTOLIN HFA) 108 (90 Base) MCG/ACT inhaler Inhale 2 puffs into the lungs every 6 (six) hours as needed for wheezing or shortness of breath. 1 Inhaler 0  . aspirin 81 MG chewable tablet Chew 81 mg by mouth daily.    Marland Kitchen bismuth subsalicylate (PEPTO BISMOL) 262 MG/15ML suspension Take 30 mLs by mouth every 4 (four) hours as needed for indigestion.     . Blood Glucose Monitoring Suppl (TRUE METRIX METER) w/Device KIT Use to check blood sugars up to 3 times per day 1 kit 0  . carvedilol (COREG) 6.25 MG tablet Take 1 tablet (6.25 mg total) by mouth 2 (two) times daily with a meal. 180 tablet 1  . diphenhydrAMINE (BENADRYL) 25 MG tablet Take 25 mg by mouth every 6 (six) hours as needed for allergies.     . diphenhydramine-acetaminophen (TYLENOL PM EXTRA STRENGTH) 25-500 MG TABS tablet Take 2 tablets by mouth at bedtime as needed (sleep).     . furosemide (LASIX) 20 MG tablet Take 1 tablet (20 mg total) by mouth daily. 90 tablet 3  . glucose blood (TRUE METRIX BLOOD GLUCOSE TEST) test strip Use to check blood sugars up to 3 times per day 100 each 12  . guaiFENesin (MUCINEX) 600 MG 12 hr tablet Take 1 tablet (600 mg total) by mouth 2 (two) times daily. 30 tablet 0  . hydrALAZINE (APRESOLINE)  50 MG tablet Take 1 tablet (50 mg total) by mouth 3 (three) times daily. 90 tablet 6  . ibuprofen (ADVIL,MOTRIN) 200 MG tablet Take 800 mg by mouth every 8 (eight) hours as needed for moderate pain.     Marland Kitchen insulin NPH-regular Human (NOVOLIN 70/30) (70-30) 100 UNIT/ML injection Inject 40 Units into the skin 2 (two) times daily. 24 mL 0  . ipratropium-albuterol (DUONEB) 0.5-2.5 (3) MG/3ML SOLN Take 3 mLs by nebulization every 6 (six) hours as needed. 360 mL 0  . isosorbide mononitrate (IMDUR) 30 MG 24 hr tablet Take 1 tablet (30 mg total) by mouth daily. 90 tablet 1  . Multiple Vitamin (MULTIVITAMIN WITH MINERALS) TABS  tablet Take 1 tablet by mouth once a week.     . sacubitril-valsartan (ENTRESTO) 97-103 MG Take 1 tablet by mouth 2 (two) times daily. 180 tablet 3  . spironolactone (ALDACTONE) 25 MG tablet Take 1 tablet (25 mg total) by mouth daily. 90 tablet 1  . TRUEplus Lancets 28G MISC Use to check blood sugars 3 times per day 100 each 11   No current facility-administered medications for this encounter.    Allergies  Allergen Reactions  . Banana Itching  . Latex Itching      Social History   Socioeconomic History  . Marital status: Single    Spouse name: Not on file  . Number of children: 2  . Years of education: Not on file  . Highest education level: Not on file  Occupational History  . Not on file  Tobacco Use  . Smoking status: Current Every Day Smoker    Packs/day: 0.50    Types: Cigarettes  . Smokeless tobacco: Never Used  Substance and Sexual Activity  . Alcohol use: No  . Drug use: No  . Sexual activity: Yes    Birth control/protection: Surgical, Post-menopausal  Other Topics Concern  . Not on file  Social History Narrative  . Not on file   Social Determinants of Health   Financial Resource Strain:   . Difficulty of Paying Living Expenses: Not on file  Food Insecurity:   . Worried About Charity fundraiser in the Last Year: Not on file  . Ran Out of Food in the Last Year: Not on file  Transportation Needs:   . Lack of Transportation (Medical): Not on file  . Lack of Transportation (Non-Medical): Not on file  Physical Activity:   . Days of Exercise per Week: Not on file  . Minutes of Exercise per Session: Not on file  Stress:   . Feeling of Stress : Not on file  Social Connections:   . Frequency of Communication with Friends and Family: Not on file  . Frequency of Social Gatherings with Friends and Family: Not on file  . Attends Religious Services: Not on file  . Active Member of Clubs or Organizations: Not on file  . Attends Archivist Meetings: Not  on file  . Marital Status: Not on file  Intimate Partner Violence:   . Fear of Current or Ex-Partner: Not on file  . Emotionally Abused: Not on file  . Physically Abused: Not on file  . Sexually Abused: Not on file      Family History  Problem Relation Age of Onset  . Stroke Mother   . Lung cancer Father     Vitals:   01/04/19 1100  BP: 138/82  Pulse: 94  SpO2: 96%  Weight: 90.5 kg (199 lb 9.6 oz)  Wt Readings from Last 3 Encounters:  01/04/19 90.5 kg (199 lb 9.6 oz)  08/25/18 93.4 kg (206 lb)  02/24/18 90.2 kg (198 lb 12.8 oz)    PHYSICAL EXAM: General:  No resp difficulty HEENT: normal Neck: supple. no JVD. Carotids 2+ bilat; no bruits. No lymphadenopathy or thryomegaly appreciated. Cor: PMI nondisplaced. Regular rate & rhythm. No rubs, gallops or murmurs. Lungs: clear Abdomen: soft, nontender, nondistended. No hepatosplenomegaly. No bruits or masses. Good bowel sounds. Extremities: no cyanosis, clubbing, rash, edema Neuro: alert & orientedx3, cranial nerves grossly intact. moves all 4 extremities w/o difficulty. Affect pleasant  ASSESSMENT & PLAN:  1. Chronic systolic CHF - History of NICM. Echo in 2013 with normal EF. Suspect HTN cardiomyopathy (vs OSA) -Echo5/8/19LVEF 25-30%, Trivial MR, Mild TR, PA peak pressure 40 mm Hg - R/LHC 05/29/17 with well compensated hemodynamics and minimal, non-obstructive CAD.   EF 08/2017 40-45% , Repeat ECHO next visit.   - NYHA II-III. Volume status stable. Continue 20 mg po lasix daily.   - Continue coreg 6.25 mg BID. - Continue Entresto to 97/103 BID.  -Continue spiro 25 mg daily.  - Continue hydralazine to 50 mg tid. Continue imdur 30 mg daily. - Check BMET  2. COPD - Discussed smoking cessation.   3. Tobacco Abuse Discussed smoking cessation.   4. IDDM - HgB A1c 8.6 05/28/17 -Established with PCP.   5. HTN Stable.   6. Snores Sleep study has been set up.   Follow up in 6 months with Dr Haroldine Laws and an  ECHO.   Darrick Grinder, NP 01/04/19

## 2019-01-04 NOTE — Patient Instructions (Signed)
It was great to see you today! No medication changes are needed at this time.  Labs today We will only contact you if something comes back abnormal or we need to make some changes. Otherwise no news is good news!  Your physician recommends that you schedule a follow-up appointment in: 6 months with Dr Haroldine Laws and echo  Your physician has requested that you have an echocardiogram. Echocardiography is a painless test that uses sound waves to create images of your heart. It provides your doctor with information about the size and shape of your heart and how well your heart's chambers and valves are working. This procedure takes approximately one hour. There are no restrictions for this procedure.  Do the following things EVERYDAY: 1) Weigh yourself in the morning before breakfast. Write it down and keep it in a log. 2) Take your medicines as prescribed 3) Eat low salt foods--Limit salt (sodium) to 2000 mg per day.  4) Stay as active as you can everyday 5) Limit all fluids for the day to less than 2 liters  At the [facility], you and your health needs are our priority. As part of our continuing mission to provide you with exceptional heart care, we have created designated Provider Care Teams. These Care Teams include your primary Cardiologist (physician) and Advanced Practice Providers (APPs- Physician Assistants and Nurse Practitioners) who all work together to provide you with the care you need, when you need it.   You may see any of the following providers on your designated Care Team at your next follow up: Marland Kitchen Dr Glori Bickers . Dr Loralie Champagne . Darrick Grinder, NP . Lyda Jester, PA . Audry Riles, PharmD   Please be sure to bring in all your medications bottles to every appointment.

## 2019-01-11 ENCOUNTER — Other Ambulatory Visit (HOSPITAL_COMMUNITY)
Admission: RE | Admit: 2019-01-11 | Discharge: 2019-01-11 | Disposition: A | Discharge status: Home / Self Care | Payer: Self-pay | Source: Ambulatory Visit | Attending: Internal Medicine | Admitting: Internal Medicine

## 2019-01-11 DIAGNOSIS — Z20828 Contact with and (suspected) exposure to other viral communicable diseases: Secondary | ICD-10-CM | POA: Insufficient documentation

## 2019-01-11 LAB — SARS CORONAVIRUS 2 (TAT 6-24 HRS): SARS Coronavirus 2: NEGATIVE

## 2019-01-13 ENCOUNTER — Ambulatory Visit (HOSPITAL_BASED_OUTPATIENT_CLINIC_OR_DEPARTMENT_OTHER): Payer: Self-pay | Attending: Family Medicine | Admitting: Internal Medicine

## 2019-01-13 ENCOUNTER — Other Ambulatory Visit: Payer: Self-pay

## 2019-01-13 VITALS — Ht 59.0 in | Wt 199.0 lb

## 2019-01-13 DIAGNOSIS — I110 Hypertensive heart disease with heart failure: Secondary | ICD-10-CM | POA: Insufficient documentation

## 2019-01-13 DIAGNOSIS — G4733 Obstructive sleep apnea (adult) (pediatric): Secondary | ICD-10-CM | POA: Insufficient documentation

## 2019-01-13 DIAGNOSIS — I5022 Chronic systolic (congestive) heart failure: Secondary | ICD-10-CM | POA: Insufficient documentation

## 2019-01-18 ENCOUNTER — Other Ambulatory Visit (HOSPITAL_BASED_OUTPATIENT_CLINIC_OR_DEPARTMENT_OTHER): Payer: Self-pay

## 2019-01-18 DIAGNOSIS — I5022 Chronic systolic (congestive) heart failure: Secondary | ICD-10-CM

## 2019-01-18 DIAGNOSIS — G479 Sleep disorder, unspecified: Secondary | ICD-10-CM

## 2019-01-18 DIAGNOSIS — I10 Essential (primary) hypertension: Secondary | ICD-10-CM

## 2019-01-24 DIAGNOSIS — G4733 Obstructive sleep apnea (adult) (pediatric): Secondary | ICD-10-CM

## 2019-01-24 NOTE — Procedures (Signed)
Patient Name: Felicia [name], Felicia [name] Date: 01/13/2019 Gender: Female D.O.B: 1960/09/24 Age (years): 73 Referring Provider: Cammie Fulp Height (inches): 59 Interpreting Physician: Jetty Duhamel MD, ABSM Weight (lbs): 199 RPSGT: Lise Auer BMI: 40 MRN: [id number] Neck Size: 17.00  CLINICAL INFORMATION Sleep Study Type: NPSG Indication for sleep study: Hypertension, Snoring Epworth Sleepiness Score:  SLEEP STUDY TECHNIQUE As per the AASM Manual for the Scoring of Sleep and Associated Events v2.3 (April 2016) with a hypopnea requiring 4% desaturations.  The channels recorded and monitored were frontal, central and occipital EEG, electrooculogram (EOG), submentalis EMG (chin), nasal and oral airflow, thoracic and abdominal wall motion, anterior tibialis EMG, snore microphone, electrocardiogram, and pulse oximetry.  MEDICATIONS Medications self-administered by patient taken the night of the study : none reported  SLEEP ARCHITECTURE The study was initiated at 10:19:50 PM and ended at 5:05:13 AM.  Sleep onset time was 13.9 minutes and the sleep efficiency was 82.9%%. The total sleep time was 336 minutes.  Stage REM latency was 75.5 minutes.  The patient spent 9.4%% of the night in stage N1 sleep, 77.4%% in stage N2 sleep, 0.6%% in stage N3 and 12.7% in REM.  Alpha intrusion was absent.  Supine sleep was 34.38%.  RESPIRATORY PARAMETERS The overall apnea/hypopnea index (AHI) was 5.9 per hour. There were 23 total apneas, including 22 obstructive, 1 central and 0 mixed apneas. There were 10 hypopneas and 0 RERAs.  The AHI during Stage REM sleep was 38.1 per hour.  AHI while supine was 5.7 per hour.  The mean oxygen saturation was 90.6%. The minimum SpO2 during sleep was 68.0%.  soft snoring was noted during this study.  CARDIAC DATA The 2 lead EKG demonstrated sinus rhythm. The mean heart rate was 84.2 beats per minute. Other EKG findings include: PVCs.  LEG  MOVEMENT DATA The total PLMS were 0 with a resulting PLMS index of 0.0. Associated arousal with leg movement index was 0.9 .  IMPRESSIONS - Mild obstructive sleep apnea occurred during this study (AHI = 5.9/h). Most eventss were in REM. - Insufficient early events to meet protocol requirement for split CPAP titration. - No significant central sleep apnea occurred during this study (CAI = 0.2/h). - Oxygen desaturation was noted during this study (Min O2 = 68.0%). Mean sat 90.8%. - Time with O2 saturation 88% or less was 21.4 minutes. - The patient snored with soft snoring volume. - EKG findings include PVCs. - Clinically significant periodic limb movements did not occur during sleep. No significant associated arousals.  DIAGNOSIS - Obstructive Sleep Apnea (327.23 [G47.33 ICD-10]) - Nocturnal Hypoxemia (327.26 [G47.36 ICD-10])  RECOMMENDATIONS - Very mild obstructive sleep apnea. Conservative measures such as weight loss and sleep position off back may not be sufficient to correct nocturnal hypoxemia. - Consider reassessment after weight loss, or consider CPAP or a fitted oral appliance, based on clinical judgment. - Be careful with alcohol, sedatives and other CNS depressants that may worsen sleep apnea and disrupt normal sleep architecture. - Sleep hygiene should be reviewed to assess factors that may improve sleep quality. - Weight management and regular exercise should be initiated or continued if appropriate.  [Electronically signed] 01/24/2019 11:42 AM  Jetty Duhamel MD, ABSM Diplomate, American Board of Sleep Medicine   NPI: [id number]

## 2019-01-26 ENCOUNTER — Telehealth: Payer: Self-pay | Admitting: *Deleted

## 2019-01-26 NOTE — Telephone Encounter (Signed)
Patient verified DOB Patient is aware of mild OSA being noted and being helped with weight loss and sleeping on her side. Patient is aware of benefiting from a CPAP due to her O2 level decreasing while sleeping. Patient will await the phone call from case manager jane regarding pick up and teaching.

## 2019-01-26 NOTE — Telephone Encounter (Signed)
-----   Message from Cain Saupe, MD sent at 01/25/2019  9:12 PM EST ----- Sleep study revealed mild obstructive sleep apnea which may improve with weight loss as well as avoiding sleeping on your back-try to sleep on your side. The sleep study also showed that you had issues with your oxygen level decreasing during sleep so also consider CPAP use or an oral appliance. The nurse case manager from this office will contact you about options.

## 2019-02-10 MED FILL — SPIRONOLACTONE 25 MG TABS: 25 | 30 days supply | Qty: 30 | Fill #4

## 2019-02-15 MED FILL — ISOSORBIDE MN ER 30 MG TAB: 30 | 30 days supply | Qty: 30 | Fill #4

## 2019-02-15 MED FILL — hydrALAZINE HCL 50 MG TABS: 50 | 30 days supply | Qty: 90 | Fill #3

## 2019-02-15 MED FILL — CARVEDILOL 6.25 MG TABLET: 6.25 | 30 days supply | Qty: 60 | Fill #3

## 2019-02-15 MED FILL — FUROSEMIDE 20 MG TABS: 20 | 30 days supply | Qty: 30 | Fill #4

## 2019-03-04 ENCOUNTER — Telehealth: Payer: Self-pay

## 2019-03-04 NOTE — Telephone Encounter (Signed)
Call placed to patient # [phone] regarding sleep study recommendations.  Call back requested to this CM

## 2019-03-11 NOTE — Telephone Encounter (Signed)
Call placed to patient regarding CPAP.  She is uninsured and stated that she is not able to afford a machine.  Explained to her that at this time, there is not an option for a charity/hardhsip program to assist with the cost of the machine  If one becomes available, this CM can contact her.  This CM reviewed again with her the results of her sleep study and the recommendations for addressing her very mild OSA and she stated that she understood.

## 2019-03-24 ENCOUNTER — Other Ambulatory Visit: Payer: Self-pay | Admitting: *Deleted

## 2019-03-24 MED FILL — SPIRONOLACTONE 25 MG TABS: 25 | 30 days supply | Qty: 30 | Fill #5

## 2019-03-24 MED FILL — CARVEDILOL 6.25 MG TABLET: 6.25 | 30 days supply | Qty: 60 | Fill #4

## 2019-03-24 MED FILL — FUROSEMIDE 20 MG TABS: 20 | 30 days supply | Qty: 30 | Fill #5

## 2019-03-24 MED FILL — ISOSORBIDE MN ER 30 MG TAB: 30 | 30 days supply | Qty: 30 | Fill #5

## 2019-03-24 MED FILL — hydrALAZINE HCL 50 MG TABS: 50 | 30 days supply | Qty: 90 | Fill #4

## 2019-03-25 ENCOUNTER — Ambulatory Visit: Payer: Self-pay | Attending: Family Medicine | Admitting: Family Medicine

## 2019-03-25 ENCOUNTER — Encounter: Payer: Self-pay | Admitting: Family Medicine

## 2019-03-25 ENCOUNTER — Other Ambulatory Visit: Payer: Self-pay

## 2019-03-25 VITALS — BP 115/80 | HR 84 | Ht 59.0 in | Wt 201.0 lb

## 2019-03-25 DIAGNOSIS — Z794 Long term (current) use of insulin: Secondary | ICD-10-CM

## 2019-03-25 DIAGNOSIS — I5022 Chronic systolic (congestive) heart failure: Secondary | ICD-10-CM

## 2019-03-25 DIAGNOSIS — I10 Essential (primary) hypertension: Secondary | ICD-10-CM

## 2019-03-25 DIAGNOSIS — Z720 Tobacco use: Secondary | ICD-10-CM

## 2019-03-25 DIAGNOSIS — E119 Type 2 diabetes mellitus without complications: Secondary | ICD-10-CM

## 2019-03-25 DIAGNOSIS — I2510 Atherosclerotic heart disease of native coronary artery without angina pectoris: Secondary | ICD-10-CM

## 2019-03-25 DIAGNOSIS — J449 Chronic obstructive pulmonary disease, unspecified: Secondary | ICD-10-CM

## 2019-03-25 DIAGNOSIS — Z79899 Other long term (current) drug therapy: Secondary | ICD-10-CM

## 2019-03-25 LAB — POCT GLYCOSYLATED HEMOGLOBIN (HGB A1C): HbA1c, POC (controlled diabetic range): 7.1 % — AB (ref 0.0–7.0)

## 2019-03-25 LAB — GLUCOSE, POCT (MANUAL RESULT ENTRY): POC Glucose: 169 mg/dL — AB (ref 70–99)

## 2019-03-25 NOTE — Patient Instructions (Addendum)
Please call if you would like to see the clinical pharmacist for help to stop smoking   Chronic Obstructive Pulmonary Disease Chronic obstructive pulmonary disease (COPD) is a long-term (chronic) lung problem. When you have COPD, it is hard for air to get in and out of your lungs. Usually the condition gets worse over time, and your lungs will never return to normal. There are things you can do to keep yourself as healthy as possible.  Your doctor may treat your condition with: ? Medicines. ? Oxygen. ? Lung surgery.  Your doctor may also recommend: ? Rehabilitation. This includes steps to make your body work better. It may involve a team of specialists. ? Quitting smoking, if you smoke. ? Exercise and changes to your diet. ? Comfort measures (palliative care). Follow these instructions at home: Medicines  Take over-the-counter and prescription medicines only as told by your doctor.  Talk to your doctor before taking any cough or allergy medicines. You may need to avoid medicines that cause your lungs to be dry. Lifestyle  If you smoke, stop. Smoking makes the problem worse. If you need help quitting, ask your doctor.  Avoid being around things that make your breathing worse. This may include smoke, chemicals, and fumes.  Stay active, but remember to rest as well.  Learn and use tips on how to relax.  Make sure you get enough sleep. Most adults need at least 7 hours of sleep every night.  Eat healthy foods. Eat smaller meals more often. Rest before meals. Controlled breathing Learn and use tips on how to control your breathing as told by your doctor. Try:  Breathing in (inhaling) through your nose for 1 second. Then, pucker your lips and breath out (exhale) through your lips for 2 seconds.  Putting one hand on your belly (abdomen). Breathe in slowly through your nose for 1 second. Your hand on your belly should move out. Pucker your lips and breathe out slowly through your  lips. Your hand on your belly should move in as you breathe out.  Controlled coughing Learn and use controlled coughing to clear mucus from your lungs. Follow these steps: 1. Lean your head a little forward. 2. Breathe in deeply. 3. Try to hold your breath for 3 seconds. 4. Keep your mouth slightly open while coughing 2 times. 5. Spit any mucus out into a tissue. 6. Rest and do the steps again 1 or 2 times as needed. General instructions  Make sure you get all the shots (vaccines) that your doctor recommends. Ask your doctor about a flu shot and a pneumonia shot.  Use oxygen therapy and pulmonary rehabilitation if told by your doctor. If you need home oxygen therapy, ask your doctor if you should buy a tool to measure your oxygen level (oximeter).  Make a COPD action plan with your doctor. This helps you to know what to do if you feel worse than usual.  Manage any other conditions you have as told by your doctor.  Avoid going outside when it is very hot, cold, or humid.  Avoid people who have a sickness you can catch (contagious).  Keep all follow-up visits as told by your doctor. This is important. Contact a doctor if:  You cough up more mucus than usual.  There is a change in the color or thickness of the mucus.  It is harder to breathe than usual.  Your breathing is faster than usual.  You have trouble sleeping.  You need to use  your medicines more often than usual.  You have trouble doing your normal activities such as getting dressed or walking around the house. Get help right away if:  You have shortness of breath while resting.  You have shortness of breath that stops you from: ? Being able to talk. ? Doing normal activities.  Your chest hurts for longer than 5 minutes.  Your skin color is more blue than usual.  Your pulse oximeter shows that you have low oxygen for longer than 5 minutes.  You have a fever.  You feel too tired to breathe  normally. Summary  Chronic obstructive pulmonary disease (COPD) is a long-term lung problem.  The way your lungs work will never return to normal. Usually the condition gets worse over time. There are things you can do to keep yourself as healthy as possible.  Take over-the-counter and prescription medicines only as told by your doctor.  If you smoke, stop. Smoking makes the problem worse. This information is not intended to replace advice given to you by your health care provider. Make sure you discuss any questions you have with your health care provider. Document Revised: 12/20/2016 Document Reviewed: 02/12/2016 Elsevier Patient Education  2020 [employer].

## 2019-03-25 NOTE — Progress Notes (Signed)
Subjective:  Patient ID: Felicia [name], female    DOB: [birth year]  Age: 59 y.o. MRN: [id number]  CC: Diabetes   HPI Felicia [name], 59 yo female who was last seen  on 12/03/2018 for telemedicine visit, who is being seen in follow-up of chronic medical issues including type 2 DM, HTN, cardiomyopathy, CHF, COPD and CAD. She reports that she is doing well at this time.  She is compliant with all of her medications.  She denies any issues with increased thirst or urinary frequency related to her diabetes.  Blood sugars have been controlled and usually less than 120 fasting.  She denies headaches or dizziness related to her blood pressure and is taking the blood pressure medicine daily.  She has had no increase in shortness of breath or issues with swelling in her legs related to her CHF.  She reports no chest pain or palpitations related to her coronary artery disease.  She denies any unusual bruising or bleeding with her use of daily aspirin.  Past Medical History:  Diagnosis Date  . Cardiomyopathy   . CHF (congestive heart failure) (HCC)    minamal  . COPD (chronic obstructive pulmonary disease) ([place])   . DDD (degenerative disc disease), lumbar   . Diabetes mellitus   . Hypertension   . Non compliance w medication regimen   . Normal echocardiogram    LVEF 25-30% 03/27/2010    Past Surgical History:  Procedure Laterality Date  . CARDIAC CATHETERIZATION    . RIGHT/LEFT HEART CATH AND CORONARY ANGIOGRAPHY N/A 05/29/2017   Procedure: RIGHT/LEFT HEART CATH AND CORONARY ANGIOGRAPHY;  Surgeon: Jolaine Artist, MD;  Location: [facility];  Service: Cardiovascular;  Laterality: N/A;  . TUBAL LIGATION      Family History  Problem Relation Age of Onset  . Stroke Mother   . Lung cancer Father     Social History   Tobacco Use  . Smoking status: Current Every Day Smoker    Packs/day: 0.50    Types: Cigarettes  . Smokeless tobacco: Never Used  Substance Use Topics  .  Alcohol use: No    ROS Review of Systems  Constitutional: Positive for fatigue (Mild). Negative for chills and fever.  HENT: Negative for sore throat and trouble swallowing.   Eyes: Negative for photophobia and visual disturbance.  Respiratory: Positive for shortness of breath (Occasional with exertion). Negative for cough.   Cardiovascular: Negative for chest pain and palpitations.  Gastrointestinal: Negative for abdominal pain, constipation, diarrhea and nausea.  Endocrine: Negative for polydipsia, polyphagia and polyuria.  Genitourinary: Negative for dysuria and frequency.  Musculoskeletal: Negative for arthralgias and back pain.  Skin: Negative for rash and wound.  Neurological: Negative for dizziness and headaches.  Hematological: Negative for adenopathy. Does not bruise/bleed easily.    Objective:   Today's Vitals: BP 115/80   Pulse 84   Ht 4' 11"  (1.499 m)   Wt 201 lb (91.2 kg)   LMP 08/02/2010   SpO2 96%   BMI 40.60 kg/m   Physical Exam Vitals and nursing note reviewed.  Constitutional:      General: She is not in acute distress.    Appearance: Normal appearance. She is obese.     Comments: Patient is small statured  Neck:     Vascular: No carotid bruit.  Cardiovascular:     Rate and Rhythm: Normal rate and regular rhythm.     Pulses:  Dorsalis pedis pulses are 1+ on the right side and 1+ on the left side.       Posterior tibial pulses are 1+ on the right side and 1+ on the left side.  Pulmonary:     Effort: Pulmonary effort is normal.     Comments: Rhonchrus breath sounds with exahlation; slight hyperresonant breath sounds Abdominal:     Tenderness: There is no abdominal tenderness. There is no right CVA tenderness, left CVA tenderness, guarding or rebound.  Musculoskeletal:        General: No tenderness.     Cervical back: Normal range of motion and neck supple.     Right lower leg: No edema.     Left lower leg: No edema.  Feet:     Right foot:       Protective Sensation: 10 sites tested. 10 sites sensed.     Skin integrity: Skin integrity normal.     Toenail Condition: Right toenails are normal.     Left foot:     Protective Sensation: 10 sites tested. 10 sites sensed.     Skin integrity: Skin integrity normal.     Toenail Condition: Left toenails are normal.  Skin:    General: Skin is warm and dry.  Neurological:     General: No focal deficit present.     Mental Status: She is alert and oriented to person, place, and time.  Psychiatric:        Mood and Affect: Mood normal.        Behavior: Behavior normal.     Assessment & Plan:  1. Controlled type 2 diabetes mellitus with  Patient is status post telemedicine visit on 12/03/2018 to establish care but unfortunately did not keep lab appointment for hemoglobin A1c, lipid panel and comprehensive metabolic panel after her visit.  At today's visit, her blood sugar is 169 and hemoglobin A1c is 7.1.  She is to continue the use of her current medications as well as a low carbohydrate diet, exercise with goal of weight loss and will have comprehensive metabolic panel at today's visit. - POCT glucose (manual entry) - POCT glycosylated hemoglobin (Hb A1C) - Comprehensive metabolic panel  2. Chronic obstructive pulmonary disease, unspecified COPD type ([place]); tobacco use Discussed the need for complete smoking cessation.  Information on COPD given as part of after visit summary.  3. Chronic systolic heart failure (HCC) CHF appears to be stable.  She is to continue the use of carvedilol, Entresto, spironolactone, furosemide and follow-up with cardiology.  4. Essential hypertension Blood pressure stable and well-controlled on current medication regimen.  She is on multiple medications for control of blood pressure and CHF as well as CAD.  She will continue the use of carvedilol, Entresto, spironolactone, furosemide, Imdur and hydralazine.  5. Coronary artery disease involving native  coronary artery of native heart without angina pectoris Continue secondary prevention practices such as making sure that cholesterol and blood sugars and blood pressure remain well controlled.  Continue daily aspirin therapy.  Weight loss and regular exercise encouraged. - Comprehensive metabolic panel  6. Encounter for long-term current use of medication Comprehensive metabolic panel will be done at today's visit in follow-up of long-term use of multiple medications which can pose risk such as elevated potassium, changes in creatinine and liver enzymes. - Comprehensive metabolic panel   Outpatient Encounter Medications as of 03/25/2019  Medication Sig  . acetaminophen (TYLENOL) 500 MG tablet Take 1,500 mg by mouth daily as needed for mild  pain or headache.  . albuterol (PROVENTIL HFA;VENTOLIN HFA) 108 (90 Base) MCG/ACT inhaler Inhale 2 puffs into the lungs every 6 (six) hours as needed for wheezing or shortness of breath.  Marland Kitchen aspirin 81 MG chewable tablet Chew 81 mg by mouth daily.  Marland Kitchen bismuth subsalicylate (PEPTO BISMOL) 262 MG/15ML suspension Take 30 mLs by mouth every 4 (four) hours as needed for indigestion.   . Blood Glucose Monitoring Suppl (TRUE METRIX METER) w/Device KIT Use to check blood sugars up to 3 times per day  . carvedilol (COREG) 6.25 MG tablet Take 1 tablet (6.25 mg total) by mouth 2 (two) times daily with a meal.  . diphenhydrAMINE (BENADRYL) 25 MG tablet Take 25 mg by mouth every 6 (six) hours as needed for allergies.   . diphenhydramine-acetaminophen (TYLENOL PM EXTRA STRENGTH) 25-500 MG TABS tablet Take 2 tablets by mouth at bedtime as needed (sleep).   Marland Kitchen glucose blood (TRUE METRIX BLOOD GLUCOSE TEST) test strip Use to check blood sugars up to 3 times per day  . ibuprofen (ADVIL,MOTRIN) 200 MG tablet Take 800 mg by mouth every 8 (eight) hours as needed for moderate pain.   . Multiple Vitamin (MULTIVITAMIN WITH MINERALS) TABS tablet Take 1 tablet by mouth once a week.   .  sacubitril-valsartan (ENTRESTO) 97-103 MG Take 1 tablet by mouth 2 (two) times daily.  Marland Kitchen spironolactone (ALDACTONE) 25 MG tablet Take 1 tablet (25 mg total) by mouth daily.  . TRUEplus Lancets 28G MISC Use to check blood sugars 3 times per day  . furosemide (LASIX) 20 MG tablet Take 1 tablet (20 mg total) by mouth daily.  Marland Kitchen guaiFENesin (MUCINEX) 600 MG 12 hr tablet Take 1 tablet (600 mg total) by mouth 2 (two) times daily. (Patient not taking: Reported on 03/25/2019)  . hydrALAZINE (APRESOLINE) 50 MG tablet Take 1 tablet (50 mg total) by mouth 3 (three) times daily.  . insulin NPH-regular Human (NOVOLIN 70/30) (70-30) 100 UNIT/ML injection Inject 40 Units into the skin 2 (two) times daily.  Marland Kitchen ipratropium-albuterol (DUONEB) 0.5-2.5 (3) MG/3ML SOLN Take 3 mLs by nebulization every 6 (six) hours as needed.  . isosorbide mononitrate (IMDUR) 30 MG 24 hr tablet Take 1 tablet (30 mg total) by mouth daily.   No facility-administered encounter medications on file as of 03/25/2019.    An After Visit Summary was printed and given to the patient.   Follow-up: Return in about 4 months (around 07/25/2019) for DM and chronic issues; also schedule annual well exam.  30 or more minutes was spent with face-to-face time with the patient as well as additional time for review of chart, placement of orders and completion of encounter note.  Antony Blackbird MD

## 2019-03-26 LAB — COMPREHENSIVE METABOLIC PANEL WITH GFR
ALT: 19 IU/L (ref 0–32)
AST: 14 IU/L (ref 0–40)
Albumin/Globulin Ratio: 1.7 (ref 1.2–2.2)
Albumin: 4 g/dL (ref 3.8–4.9)
Alkaline Phosphatase: 113 IU/L (ref 39–117)
BUN/Creatinine Ratio: 18 (ref 9–23)
BUN: 16 mg/dL (ref 6–24)
Bilirubin Total: 0.2 mg/dL (ref 0.0–1.2)
CO2: 26 mmol/L (ref 20–29)
Calcium: 9.9 mg/dL (ref 8.7–10.2)
Chloride: 101 mmol/L (ref 96–106)
Creatinine, Ser: 0.91 mg/dL (ref 0.57–1.00)
GFR calc Af Amer: 81 mL/min/1.73
GFR calc non Af Amer: 70 mL/min/1.73
Globulin, Total: 2.4 g/dL (ref 1.5–4.5)
Glucose: 129 mg/dL — ABNORMAL HIGH (ref 65–99)
Potassium: 4.1 mmol/L (ref 3.5–5.2)
Sodium: 141 mmol/L (ref 134–144)
Total Protein: 6.4 g/dL (ref 6.0–8.5)

## 2019-04-27 ENCOUNTER — Telehealth (HOSPITAL_COMMUNITY): Payer: Self-pay | Admitting: Pharmacist

## 2019-04-27 MED ORDER — SPIRONOLACTONE 25 MG PO TABS: 25.0000 mg | ORAL_TABLET | Freq: Every day | ORAL | 3 refills | Status: DC

## 2019-04-27 MED ORDER — ISOSORBIDE MONONITRATE ER 30 MG PO TB24: 30.0000 mg | ORAL_TABLET | Freq: Every day | ORAL | 3 refills | Status: DC

## 2019-04-27 NOTE — Telephone Encounter (Signed)
Refills for spironolactone and Imdur sent to [facility].

## 2019-05-07 ENCOUNTER — Other Ambulatory Visit (HOSPITAL_COMMUNITY): Payer: Self-pay | Admitting: Adult Health

## 2019-05-07 ENCOUNTER — Telehealth (HOSPITAL_COMMUNITY): Payer: Self-pay | Admitting: Pharmacist

## 2019-05-07 MED ORDER — ISOSORBIDE MONONITRATE ER 30 MG PO TB24: 30.0000 mg | ORAL_TABLET | Freq: Every day | ORAL | 3 refills | Status: DC

## 2019-05-07 MED ORDER — SPIRONOLACTONE 25 MG PO TABS: 25.0000 mg | ORAL_TABLET | Freq: Every day | ORAL | 3 refills | Status: DC

## 2019-05-07 MED FILL — ISOSORBIDE MN ER 30 MG TAB: 30 | 30 days supply | Qty: 30 | Fill #0

## 2019-05-07 MED FILL — SPIRONOLACTONE 25 MG TABS: 25 | 30 days supply | Qty: 30 | Fill #0

## 2019-06-23 MED FILL — ISOSORBIDE MN ER 30 MG TAB: 30 | 30 days supply | Qty: 30 | Fill #1

## 2019-06-23 MED FILL — SPIRONOLACTONE 25 MG TABS: 25 | 30 days supply | Qty: 30 | Fill #1

## 2019-06-28 ENCOUNTER — Other Ambulatory Visit: Payer: Self-pay

## 2019-06-28 ENCOUNTER — Ambulatory Visit (HOSPITAL_COMMUNITY)
Admission: EM | Admit: 2019-06-28 | Discharge: 2019-06-28 | Disposition: A | Discharge status: Home / Self Care | Payer: Self-pay | Attending: Family Medicine | Admitting: Family Medicine

## 2019-06-28 ENCOUNTER — Encounter (HOSPITAL_COMMUNITY): Payer: Self-pay | Admitting: Emergency Medicine

## 2019-06-28 DIAGNOSIS — K0889 Other specified disorders of teeth and supporting structures: Secondary | ICD-10-CM

## 2019-06-28 DIAGNOSIS — K047 Periapical abscess without sinus: Secondary | ICD-10-CM

## 2019-06-28 MED ORDER — AMOXICILLIN 500 MG PO CAPS: 500.0000 mg | ORAL_CAPSULE | Freq: Two times a day (BID) | ORAL | 0 refills | Status: AC

## 2019-06-28 MED ORDER — IBUPROFEN 800 MG PO TABS: 800.0000 mg | ORAL_TABLET | Freq: Three times a day (TID) | ORAL | 0 refills | Status: DC

## 2019-06-28 NOTE — Discharge Instructions (Signed)
Please use dental resource to contact offices to seek permenant treatment/relief.  ° °Today we have given you an antibiotic. This should help with pain as any infection is cleared.  ° °For pain please take 600mg-800mg of Ibuprofen every 8 hours, take with 1000 mg of Tylenol Extra strength every 8 hours. These are safe to take together. Please take with food.  ° °Please return if you start to experience significant swelling of your face, experiencing fever. °

## 2019-06-28 NOTE — ED Triage Notes (Signed)
Pt here for left sided dental pain x 10 days

## 2019-06-28 NOTE — ED Provider Notes (Signed)
[place]    CSN: [id number] Arrival date & time: 06/28/19  0940      History   Chief Complaint Chief Complaint  Patient presents with  . Dental Pain    HPI Felicia [name] is a 59 y.o. female history of hypertension, DM type II, COPD, CHF presenting today for evaluation of dental pain.  Patient reports over the past 10 days she has had increased pain and swelling around a tooth to her right lower jaw.  Reports that she has broken tooth in this area and needs to have it removed.  She denies fevers, difficulty swallowing.  Denies neck pain or stiffness.  HPI  Past Medical History:  Diagnosis Date  . Cardiomyopathy   . CHF (congestive heart failure) (HCC)    minamal  . COPD (chronic obstructive pulmonary disease) ([place])   . DDD (degenerative disc disease), lumbar   . Diabetes mellitus   . Hypertension   . Non compliance w medication regimen   . Normal echocardiogram    LVEF 25-30% 03/27/2010    Patient Active Problem List   Diagnosis Date Noted  . Influenza B 01/13/2018  . Syncope 01/13/2018  . AKI (acute kidney injury) ([place]) 01/13/2018  . Nausea & vomiting 01/13/2018  . COPD (chronic obstructive pulmonary disease) ([place]) 06/11/2017  . Chronic systolic heart failure ([place]) 06/11/2017  . Snoring 06/11/2017  . Acute systolic heart failure ([place])   . COPD with acute exacerbation ([place]) 05/27/2017  . Obesity 05/27/2017  . Tobacco use 05/27/2017  . Acute respiratory failure with hypoxia ([place]) 05/27/2017  . Hyperglycemia 01/13/2015  . Back abscess 01/13/2015  . Uncontrolled diabetes mellitus ([place]) 01/13/2015  . Hypertension 05/31/2011  . Nonischemic cardiomyopathy ([place]) 04/11/2010  . DM2 (diabetes mellitus, type 2) ([place]) 04/11/2010    Past Surgical History:  Procedure Laterality Date  . CARDIAC CATHETERIZATION    . RIGHT/LEFT HEART CATH AND CORONARY ANGIOGRAPHY N/A 05/29/2017   Procedure: RIGHT/LEFT HEART CATH AND CORONARY ANGIOGRAPHY;  Surgeon: Jolaine Artist, MD;  Location: [facility];  Service: Cardiovascular;  Laterality: N/A;  . TUBAL LIGATION      OB History    Gravida  7   Para  5   Term  5   Preterm      AB  2   Living  5     SAB  2   TAB      Ectopic      Multiple      Live Births               Home Medications    Prior to Admission medications   Medication Sig Start Date End Date Taking? Authorizing Provider  acetaminophen (TYLENOL) 500 MG tablet Take 1,500 mg by mouth daily as needed for mild pain or headache.    [provider]  albuterol (PROVENTIL HFA;VENTOLIN HFA) 108 (90 Base) MCG/ACT inhaler Inhale 2 puffs into the lungs every 6 (six) hours as needed for wheezing or shortness of breath. 05/30/17   Shelly Coss, MD  amoxicillin (AMOXIL) 500 MG capsule Take 1 capsule (500 mg total) by mouth 2 (two) times daily for 7 days. 06/28/19 07/05/19  Braylyn Kalter C, PA-C  aspirin 81 MG chewable tablet Chew 81 mg by mouth daily. 04/16/16   [provider]  bismuth subsalicylate (PEPTO BISMOL) 262 MG/15ML suspension Take 30 mLs by mouth every 4 (four) hours as needed for indigestion.     [provider]  Blood Glucose Monitoring Suppl (TRUE METRIX METER) w/Device KIT Use to check blood sugars up to 3 times per day 12/03/18   Fulp, Cammie, MD  carvedilol (COREG) 6.25 MG tablet Take 1 tablet (6.25 mg total) by mouth 2 (two) times daily with a meal. 08/25/18   Clegg, Amy D, NP  diphenhydrAMINE (BENADRYL) 25 MG tablet Take 25 mg by mouth every 6 (six) hours as needed for allergies.     [provider]  diphenhydramine-acetaminophen (TYLENOL PM EXTRA STRENGTH) 25-500 MG TABS tablet Take 2 tablets by mouth at bedtime as needed (sleep).     [provider]  furosemide (LASIX) 20 MG tablet Take 1 tablet (20 mg total) by mouth daily. 08/25/18 01/04/19  Clegg, Amy D, NP  glucose blood (TRUE METRIX BLOOD GLUCOSE TEST) test strip Use to check blood sugars up to 3 times per  day 12/03/18   Fulp, Cammie, MD  guaiFENesin (MUCINEX) 600 MG 12 hr tablet Take 1 tablet (600 mg total) by mouth 2 (two) times daily. Patient not taking: Reported on 03/25/2019 01/14/18   Debbe Odea, MD  hydrALAZINE (APRESOLINE) 50 MG tablet Take 1 tablet (50 mg total) by mouth 3 (three) times daily. 08/25/18 01/04/19  Clegg, Amy D, NP  ibuprofen (ADVIL) 800 MG tablet Take 1 tablet (800 mg total) by mouth 3 (three) times daily. 06/28/19   Festus Pursel C, PA-C  insulin NPH-regular Human (NOVOLIN 70/30) (70-30) 100 UNIT/ML injection Inject 40 Units into the skin 2 (two) times daily. 05/30/17 01/04/19  Shelly Coss, MD  ipratropium-albuterol (DUONEB) 0.5-2.5 (3) MG/3ML SOLN Take 3 mLs by nebulization every 6 (six) hours as needed. 01/04/19 02/03/19  Clegg, Amy D, NP  isosorbide mononitrate (IMDUR) 30 MG 24 hr tablet Take 1 tablet (30 mg total) by mouth daily. 05/07/19   Bensimhon, Shaune Pascal, MD  Multiple Vitamin (MULTIVITAMIN WITH MINERALS) TABS tablet Take 1 tablet by mouth once a week.     [provider]  sacubitril-valsartan (ENTRESTO) 97-103 MG Take 1 tablet by mouth 2 (two) times daily. 09/03/17   Bensimhon, Shaune Pascal, MD  spironolactone (ALDACTONE) 25 MG tablet Take 1 tablet (25 mg total) by mouth daily. 05/07/19   Bensimhon, Shaune Pascal, MD  TRUEplus Lancets 28G MISC Use to check blood sugars 3 times per day 12/03/18   Antony Blackbird, MD    Family History Family History  Problem Relation Age of Onset  . Stroke Mother   . Lung cancer Father     Social History Social History   Tobacco Use  . Smoking status: Current Every Day Smoker    Packs/day: 0.50    Types: Cigarettes  . Smokeless tobacco: Never Used  Substance Use Topics  . Alcohol use: No  . Drug use: No     Allergies   Banana and Latex   Review of Systems Review of Systems  Constitutional: Negative for activity change, appetite change, chills, fatigue and fever.  HENT: Positive for dental problem. Negative for  congestion, ear pain, rhinorrhea, sinus pressure, sore throat and trouble swallowing.   Eyes: Negative for discharge and redness.  Respiratory: Negative for cough, chest tightness and shortness of breath.   Cardiovascular: Negative for chest pain.  Gastrointestinal: Negative for abdominal pain, diarrhea, nausea and vomiting.  Musculoskeletal: Negative for myalgias.  Skin: Negative for rash.  Neurological: Negative for dizziness, light-headedness and headaches.     Physical Exam Triage Vital Signs ED Triage Vitals  Enc Vitals Group     BP 06/28/19  1031 139/67     Pulse Rate 06/28/19 1031 82     Resp 06/28/19 1031 18     Temp 06/28/19 1031 98.6 F (37 C)     Temp Source 06/28/19 1031 Oral     SpO2 06/28/19 1031 95 %     Weight --      Height --      Head Circumference --      Peak Flow --      Pain Score 06/28/19 1032 8     Pain Loc --      Pain Edu? --      Excl. in [place]? --    No data found.  Updated Vital Signs BP 139/67 (BP Location: Right Arm)   Pulse 82   Temp 98.6 F (37 C) (Oral)   Resp 18   LMP 08/02/2010   SpO2 95%   Visual Acuity Right Eye Distance:   Left Eye Distance:   Bilateral Distance:    Right Eye Near:   Left Eye Near:    Bilateral Near:     Physical Exam Vitals and nursing note reviewed.  Constitutional:      Appearance: Normal appearance. She is well-developed.     Comments: No acute distress  HENT:     Head: Normocephalic and atraumatic.     Ears:     Comments: Bilateral ears without tenderness to palpation of external auricle, tragus and mastoid, EAC's without erythema or swelling, TM's with good bony landmarks and cone of light. Non erythematous.    Nose: Nose normal.     Mouth/Throat:     Comments: Oral mucosa pink and moist, no tonsillar enlargement or exudate. Posterior pharynx patent and nonerythematous, no uvula deviation or swelling. Normal phonation.  Fractured posterior molar noted to right lower jaw with surrounding gingival  swelling and erythema, tender to palpation  No soft palate swelling Eyes:     Conjunctiva/sclera: Conjunctivae normal.  Neck:     Comments: No overlying neck swelling or erythema, full active range of motion of neck Cardiovascular:     Rate and Rhythm: Normal rate.  Pulmonary:     Effort: Pulmonary effort is normal. No respiratory distress.  Abdominal:     General: There is no distension.  Musculoskeletal:        General: Normal range of motion.     Cervical back: Neck supple.  Skin:    General: Skin is warm and dry.  Neurological:     Mental Status: She is alert and oriented to person, place, and time.      UC Treatments / Results  Labs (all labs ordered are listed, but only abnormal results are displayed) Labs Reviewed - No data to display  EKG   Radiology No results found.  Procedures Procedures (including critical care time)  Medications Ordered in UC Medications - No data to display  Initial Impression / Assessment and Plan / UC Course  I have reviewed the triage vital signs and the nursing notes.  Pertinent labs & imaging results that were available during my care of the patient were reviewed by me and considered in my medical decision making (see chart for details).     Dental pain, likely secondary to infection.  Initiated on amoxicillin, no soft palate swelling or signs of deep space infection at this time.  Tylenol and ibuprofen for pain.  Follow-up with dentistry for more permanent treatment.  Discussed strict return precautions. Patient verbalized understanding and is agreeable with plan.  Final Clinical Impressions(s) / UC Diagnoses   Final diagnoses:  Dental infection  Pain, dental     Discharge Instructions     Please use dental resource to contact offices to seek permenant treatment/relief.   Today we have given you an antibiotic. This should help with pain as any infection is cleared.   For pain please take 620m-800mg of Ibuprofen  every 8 hours, take with 1000 mg of Tylenol Extra strength every 8 hours. These are safe to take together. Please take with food.   Please return if you start to experience significant swelling of your face, experiencing fever.    ED Prescriptions    Medication Sig Dispense Auth. Provider   amoxicillin (AMOXIL) 500 MG capsule Take 1 capsule (500 mg total) by mouth 2 (two) times daily for 7 days. 14 capsule Albina Gosney C, PA-C   ibuprofen (ADVIL) 800 MG tablet Take 1 tablet (800 mg total) by mouth 3 (three) times daily. 21 tablet Casara Perrier, [place], PA-C     PDMP not reviewed this encounter.   Kristain Hu, [place]izabethtown[place], PA-C 06/28/19 1100

## 2019-07-29 MED FILL — CARVEDILOL 6.25 MG TABLET: 6.25 | 30 days supply | Qty: 60 | Fill #5

## 2019-07-29 MED FILL — FUROSEMIDE 20 MG TABS: 20 | 30 days supply | Qty: 30 | Fill #6

## 2019-07-29 MED FILL — ISOSORBIDE MN ER 30 MG TAB: 30 | 30 days supply | Qty: 30 | Fill #2

## 2019-07-29 MED FILL — hydrALAZINE HCL 50 MG TABS: 50 | 30 days supply | Qty: 90 | Fill #5

## 2019-07-29 MED FILL — SPIRONOLACTONE 25 MG TABS: 25 | 30 days supply | Qty: 30 | Fill #2

## 2019-08-10 ENCOUNTER — Telehealth (HOSPITAL_COMMUNITY): Payer: Self-pay | Admitting: Pharmacy Technician

## 2019-08-10 NOTE — Telephone Encounter (Signed)
Spoke to patient regarding re-enrollment of Entresto assistance with [employer]. Will have application at the check in desk for the patient to sign.  Will follow up.

## 2019-08-26 ENCOUNTER — Other Ambulatory Visit: Payer: Self-pay | Admitting: Physician Assistant

## 2019-08-26 ENCOUNTER — Other Ambulatory Visit: Payer: Self-pay

## 2019-08-26 ENCOUNTER — Encounter: Payer: Self-pay | Admitting: Physician Assistant

## 2019-08-26 ENCOUNTER — Ambulatory Visit: Payer: Self-pay | Attending: Physician Assistant | Admitting: Physician Assistant

## 2019-08-26 VITALS — BP 115/77 | HR 98 | Resp 16 | Wt 199.6 lb

## 2019-08-26 DIAGNOSIS — I5022 Chronic systolic (congestive) heart failure: Secondary | ICD-10-CM

## 2019-08-26 DIAGNOSIS — E1165 Type 2 diabetes mellitus with hyperglycemia: Secondary | ICD-10-CM

## 2019-08-26 DIAGNOSIS — J449 Chronic obstructive pulmonary disease, unspecified: Secondary | ICD-10-CM

## 2019-08-26 DIAGNOSIS — I10 Essential (primary) hypertension: Secondary | ICD-10-CM

## 2019-08-26 LAB — POCT GLYCOSYLATED HEMOGLOBIN (HGB A1C): HbA1c, POC (controlled diabetic range): 7.7 % — AB (ref 0.0–7.0)

## 2019-08-26 LAB — GLUCOSE, POCT (MANUAL RESULT ENTRY): POC Glucose: 207 mg/dl — AB (ref 70–99)

## 2019-08-26 MED ORDER — INSULIN NPH ISOPHANE & REGULAR (70-30) 100 UNIT/ML ~~LOC~~ SUSP: 42.0000 [IU] | Freq: Two times a day (BID) | SUBCUTANEOUS | 5 refills | Status: DC

## 2019-08-26 MED ORDER — FUROSEMIDE 20 MG PO TABS: 20.0000 mg | ORAL_TABLET | Freq: Every day | ORAL | 3 refills | Status: DC

## 2019-08-26 MED ORDER — ISOSORBIDE MONONITRATE ER 30 MG PO TB24: 30.0000 mg | ORAL_TABLET | Freq: Every day | ORAL | 3 refills | Status: DC

## 2019-08-26 MED ORDER — ALBUTEROL SULFATE HFA 108 (90 BASE) MCG/ACT IN AERS: 2.0000 | INHALATION_SPRAY | Freq: Four times a day (QID) | RESPIRATORY_TRACT | 2 refills | Status: DC | PRN

## 2019-08-26 MED ORDER — SPIRONOLACTONE 25 MG PO TABS: 25.0000 mg | ORAL_TABLET | Freq: Every day | ORAL | 3 refills | Status: DC

## 2019-08-26 MED ORDER — ENTRESTO 97-103 MG PO TABS: 1.0000 | ORAL_TABLET | Freq: Two times a day (BID) | ORAL | 3 refills | Status: DC

## 2019-08-26 MED ORDER — HYDRALAZINE HCL 50 MG PO TABS: 50.0000 mg | ORAL_TABLET | Freq: Three times a day (TID) | ORAL | 6 refills | Status: DC

## 2019-08-26 MED ORDER — INSULIN NPH ISOPHANE & REGULAR (70-30) 100 UNIT/ML ~~LOC~~ SUSP: 40.0000 [IU] | Freq: Two times a day (BID) | SUBCUTANEOUS | 0 refills | Status: DC

## 2019-08-26 MED ORDER — IPRATROPIUM-ALBUTEROL 0.5-2.5 (3) MG/3ML IN SOLN: 3.0000 mL | Freq: Four times a day (QID) | RESPIRATORY_TRACT | 1 refills | Status: DC | PRN

## 2019-08-26 MED ORDER — CARVEDILOL 6.25 MG PO TABS: 6.2500 mg | ORAL_TABLET | Freq: Two times a day (BID) | ORAL | 1 refills | Status: DC

## 2019-08-26 MED FILL — FUROSEMIDE 20 MG TABS: 20 | 30 days supply | Qty: 30 | Fill #0

## 2019-08-26 MED FILL — hydrALAZINE HCL 50 MG TABS: 50 | 30 days supply | Qty: 90 | Fill #0

## 2019-08-26 MED FILL — SPIRONOLACTONE 25 MG TABS: 25 | 30 days supply | Qty: 30 | Fill #0

## 2019-08-26 MED FILL — IPRAT-ALBUT 0.5-3(2.5) MG/3: 0.5-2.5 (3) | 30 days supply | Qty: 360 | Fill #0

## 2019-08-26 MED FILL — CARVEDILOL 6.25 MG TABLET: 6.25 | 30 days supply | Qty: 60 | Fill #0

## 2019-08-26 MED FILL — ISOSORBIDE MN ER 30 MG TAB: 30 | 30 days supply | Qty: 30 | Fill #0

## 2019-08-26 NOTE — Progress Notes (Signed)
Patient ID: [name], female   DOB: [birth year], 59 y.o.   MRN: [id number]   Cadynce Garrette, is a 59 y.o. female  HAL:[id number]  [id number]  DOB - 24-Mar-1960  Subjective:  Chief Complaint and HPI: Venna Berberich is a 59 y.o. female here today for med RF.  Blood sugars running from 140-220.  No hypoglycemia.  Compliant with meds.   ROS:   Constitutional:  No f/c, No night sweats, No unexplained weight loss. EENT:  No vision changes, No blurry vision, No hearing changes. No mouth, throat, or ear problems.  Respiratory: No cough, No SOB Cardiac: No CP, no palpitations GI:  No abd pain, No N/V/D. GU: No Urinary s/sx Musculoskeletal: No joint pain Neuro: No headache, no dizziness, no motor weakness.  Skin: No rash Endocrine:  No polydipsia. No polyuria.  Psych: Denies SI/HI  No problems updated.  ALLERGIES: Allergies  Allergen Reactions  . Banana Itching  . Latex Itching    PAST MEDICAL HISTORY: Past Medical History:  Diagnosis Date  . Cardiomyopathy   . CHF (congestive heart failure) (HCC)    minamal  . COPD (chronic obstructive pulmonary disease) ([place])   . DDD (degenerative disc disease), lumbar   . Diabetes mellitus   . Hypertension   . Non compliance w medication regimen   . Normal echocardiogram    LVEF 25-30% 03/27/2010    MEDICATIONS AT HOME: Prior to Admission medications   Medication Sig Start Date End Date Taking? Authorizing Provider  acetaminophen (TYLENOL) 500 MG tablet Take 1,500 mg by mouth daily as needed for mild pain or headache.    [provider]  albuterol (VENTOLIN HFA) 108 (90 Base) MCG/ACT inhaler Inhale 2 puffs into the lungs every 6 (six) hours as needed for wheezing or shortness of breath. 08/26/19   Argentina Donovan, PA-C  aspirin 81 MG chewable tablet Chew 81 mg by mouth daily. 04/16/16   [provider]  bismuth subsalicylate (PEPTO BISMOL) 262 MG/15ML suspension Take 30 mLs by mouth every 4 (four) hours as  needed for indigestion.     [provider]  Blood Glucose Monitoring Suppl (TRUE METRIX METER) w/Device KIT Use to check blood sugars up to 3 times per day 12/03/18   Fulp, Cammie, MD  carvedilol (COREG) 6.25 MG tablet Take 1 tablet (6.25 mg total) by mouth 2 (two) times daily with a meal. 08/26/19   Audie Stayer, Dionne Bucy, PA-C  diphenhydrAMINE (BENADRYL) 25 MG tablet Take 25 mg by mouth every 6 (six) hours as needed for allergies.     [provider]  diphenhydramine-acetaminophen (TYLENOL PM EXTRA STRENGTH) 25-500 MG TABS tablet Take 2 tablets by mouth at bedtime as needed (sleep).     [provider]  furosemide (LASIX) 20 MG tablet Take 1 tablet (20 mg total) by mouth daily. 08/26/19 11/24/19  Argentina Donovan, PA-C  glucose blood (TRUE METRIX BLOOD GLUCOSE TEST) test strip Use to check blood sugars up to 3 times per day 12/03/18   Fulp, Cammie, MD  guaiFENesin (MUCINEX) 600 MG 12 hr tablet Take 1 tablet (600 mg total) by mouth 2 (two) times daily. Patient not taking: Reported on 03/25/2019 01/14/18   Debbe Odea, MD  hydrALAZINE (APRESOLINE) 50 MG tablet Take 1 tablet (50 mg total) by mouth 3 (three) times daily. 08/26/19 11/24/19  Argentina Donovan, PA-C  ibuprofen (ADVIL) 800 MG tablet Take 1 tablet (800 mg total) by mouth 3 (three) times daily. 06/28/19   Wieters, Elesa Hacker,  PA-C  insulin NPH-regular Human (70-30) 100 UNIT/ML injection Inject 42 Units into the skin 2 (two) times daily with a meal. 08/26/19 09/25/19  Shine Mikes, Dionne Bucy, PA-C  ipratropium-albuterol (DUONEB) 0.5-2.5 (3) MG/3ML SOLN Take 3 mLs by nebulization every 6 (six) hours as needed. 08/26/19 09/25/19  Argentina Donovan, PA-C  isosorbide mononitrate (IMDUR) 30 MG 24 hr tablet Take 1 tablet (30 mg total) by mouth daily. 08/26/19   Argentina Donovan, PA-C  Multiple Vitamin (MULTIVITAMIN WITH MINERALS) TABS tablet Take 1 tablet by mouth once a week.     [provider]  sacubitril-valsartan (ENTRESTO) 97-103 MG Take  1 tablet by mouth 2 (two) times daily. 08/26/19   Argentina Donovan, PA-C  spironolactone (ALDACTONE) 25 MG tablet Take 1 tablet (25 mg total) by mouth daily. 08/26/19   Argentina Donovan, PA-C  TRUEplus Lancets 28G MISC Use to check blood sugars 3 times per day 12/03/18   Fulp, Cammie, MD     Objective:  EXAM:   Vitals:   08/26/19 1604  BP: 115/77  Pulse: 98  Resp: 16  SpO2: 96%  Weight: 199 lb 9.6 oz (90.5 kg)    General appearance : A&OX3. NAD. Non-toxic-appearing HEENT: Atraumatic and Normocephalic.  PERRLA. EOM intact.  TM clear B. Mouth-MMM, post pharynx WNL w/o erythema, No PND. Neck: supple, no JVD. No cervical lymphadenopathy. No thyromegaly Chest/Lungs:  Breathing-non-labored, Good air entry bilaterally, breath sounds normal without rales, rhonchi, or wheezing  CVS: S1 S2 regular, no murmurs, gallops, rubs  Abdomen: Bowel sounds present, Non tender and not distended with no gaurding, rigidity or rebound. Extremities: Bilateral Lower Ext shows no edema, both legs are warm to touch with = pulse throughout Neurology:  CN II-XII grossly intact, Non focal.   Psych:  TP linear. J/I WNL. Normal speech. Appropriate eye contact and affect.  Skin:  No Rash  Data Review Lab Results  Component Value Date   HGBA1C 7.7 (A) 08/26/2019   HGBA1C 7.1 (A) 03/25/2019   HGBA1C 8.6 (H) 05/28/2017     Assessment & Plan   1. Uncontrolled type 2 diabetes mellitus with hyperglycemia (HCC) Uncontrolled-work on diet and increase NPH from 40 bid to 42 bid - Glucose (CBG) - HgB A1c - insulin NPH-regular Human (70-30) 100 UNIT/ML injection; Inject 42 Units into the skin 2 (two) times daily with a meal.  Dispense: 25.2 mL; Refill: 5  2. Essential hypertension Controlled-continue - hydrALAZINE (APRESOLINE) 50 MG tablet; Take 1 tablet (50 mg total) by mouth 3 (three) times daily.  Dispense: 90 tablet; Refill: 6 - sacubitril-valsartan (ENTRESTO) 97-103 MG; Take 1 tablet by mouth 2 (two) times  daily.  Dispense: 180 tablet; Refill: 3 - Basic metabolic panel  3. Chronic obstructive pulmonary disease, unspecified COPD type ([place]) - ipratropium-albuterol (DUONEB) 0.5-2.5 (3) MG/3ML SOLN; Take 3 mLs by nebulization every 6 (six) hours as needed.  Dispense: 360 mL; Refill: 1 - albuterol (VENTOLIN HFA) 108 (90 Base) MCG/ACT inhaler; Inhale 2 puffs into the lungs every 6 (six) hours as needed for wheezing or shortness of breath.  Dispense: 18 g; Refill: 2  4. Chronic systolic heart failure (HCC) stable - carvedilol (COREG) 6.25 MG tablet; Take 1 tablet (6.25 mg total) by mouth 2 (two) times daily with a meal.  Dispense: 180 tablet; Refill: 1 - furosemide (LASIX) 20 MG tablet; Take 1 tablet (20 mg total) by mouth daily.  Dispense: 90 tablet; Refill: 3 - spironolactone (ALDACTONE) 25 MG tablet; Take 1 tablet (25  mg total) by mouth daily.  Dispense: 90 tablet; Refill: 3     Patient have been counseled extensively about nutrition and exercise  Return in about 3 months (around 11/26/2019) for pcp for chronic conditions and fasting labs.  The patient was given clear instructions to go to ER or return to medical center if symptoms don't improve, worsen or new problems develop. The patient verbalized understanding. The patient was told to call to get lab results if they haven't heard anything in the next week.     Freeman Caldron, PA-C [facility] [place], [place]   08/26/2019, 4:13 PM

## 2019-08-27 LAB — BASIC METABOLIC PANEL
BUN/Creatinine Ratio: 14 (ref 9–23)
BUN: 14 mg/dL (ref 6–24)
CO2: 28 mmol/L (ref 20–29)
Calcium: 10.4 mg/dL — ABNORMAL HIGH (ref 8.7–10.2)
Chloride: 102 mmol/L (ref 96–106)
Creatinine, Ser: 0.98 mg/dL (ref 0.57–1.00)
GFR calc Af Amer: 74 mL/min/{1.73_m2} (ref >59)
GFR calc non Af Amer: 64 mL/min/{1.73_m2} (ref >59)
Glucose: 172 mg/dL — ABNORMAL HIGH (ref 65–99)
Potassium: 4.6 mmol/L (ref 3.5–5.2)
Sodium: 143 mmol/L (ref 134–144)

## 2019-08-27 NOTE — Telephone Encounter (Signed)
Sent in application via fax.  Will follow up.  

## 2019-09-01 ENCOUNTER — Telehealth: Payer: Self-pay | Admitting: *Deleted

## 2019-09-01 ENCOUNTER — Telehealth: Payer: Self-pay

## 2019-09-01 DIAGNOSIS — E1165 Type 2 diabetes mellitus with hyperglycemia: Secondary | ICD-10-CM

## 2019-09-01 MED ORDER — INSULIN NPH ISOPHANE & REGULAR (70-30) 100 UNIT/ML ~~LOC~~ SUSP: 42.0000 [IU] | Freq: Two times a day (BID) | SUBCUTANEOUS | 5 refills | Status: DC

## 2019-09-01 NOTE — Telephone Encounter (Signed)
Copied from CRM [id number]. Topic: General - Other >> Aug 30, 2019  2:28 PM Dalphine Handing A wrote: [facility] is requesting a callback with clarification on medication sent over by [facility]. Please advise  Best contact [phone]    Spoke to [facility] with [facility].  Suggested that prescriber send Novolin 70/30 to [facility]. For the recommended dosage, 1 vial would be 200.00 and last 11 days. [facility] would be about $4.00

## 2019-09-01 NOTE — Telephone Encounter (Signed)
Medication was sent to [facility] in [place].

## 2019-09-01 NOTE — Telephone Encounter (Signed)
Advanced Heart Failure Patient Advocate Encounter   Patient was approved to receive [employer] from [employer].  Patient ID: [id number] Effective dates: 08/31/19 through 09/10/20  Patient is aware. Advised her to call me with any issues.  Archer Asa, CPhT

## 2019-09-02 ENCOUNTER — Other Ambulatory Visit: Payer: Self-pay | Admitting: Physician Assistant

## 2019-09-02 DIAGNOSIS — E1165 Type 2 diabetes mellitus with hyperglycemia: Secondary | ICD-10-CM

## 2019-09-02 MED ORDER — INSULIN NPH ISOPHANE & REGULAR (70-30) 100 UNIT/ML ~~LOC~~ SUSP: 42.0000 [IU] | Freq: Two times a day (BID) | SUBCUTANEOUS | 11 refills | Status: DC

## 2019-09-02 NOTE — Telephone Encounter (Signed)
Yes.  Thank you.

## 2019-09-02 NOTE — Telephone Encounter (Signed)
I sent the Novolin 70/30 generic to [facility] as recommended.  The generic came up the same as what I sent to [facility].  Is this what was needed?  Thanks, Georgian Co, PA-C

## 2019-10-13 MED FILL — ISOSORBIDE MN ER 30 MG TAB: 30 | 30 days supply | Qty: 30 | Fill #1

## 2019-10-13 MED FILL — SPIRONOLACTONE 25 MG TABS: 25 | 30 days supply | Qty: 30 | Fill #1

## 2019-10-21 ENCOUNTER — Encounter (HOSPITAL_COMMUNITY): Payer: Self-pay | Admitting: Internal Medicine

## 2019-10-21 ENCOUNTER — Ambulatory Visit (HOSPITAL_COMMUNITY)
Admission: RE | Admit: 2019-10-21 | Discharge: 2019-10-21 | Disposition: A | Discharge status: Home / Self Care | Payer: Self-pay | Attending: Adult Health | Admitting: Adult Health

## 2019-10-21 ENCOUNTER — Other Ambulatory Visit: Payer: Self-pay

## 2019-10-21 ENCOUNTER — Ambulatory Visit (HOSPITAL_BASED_OUTPATIENT_CLINIC_OR_DEPARTMENT_OTHER)
Admission: RE | Admit: 2019-10-21 | Discharge: 2019-10-21 | Disposition: A | Discharge status: Home / Self Care | Payer: Self-pay | Source: Ambulatory Visit | Attending: Internal Medicine | Admitting: Internal Medicine

## 2019-10-21 VITALS — BP 102/64 | HR 95 | Ht 59.0 in | Wt 202.6 lb

## 2019-10-21 DIAGNOSIS — Z79899 Other long term (current) drug therapy: Secondary | ICD-10-CM | POA: Insufficient documentation

## 2019-10-21 DIAGNOSIS — I5022 Chronic systolic (congestive) heart failure: Secondary | ICD-10-CM | POA: Insufficient documentation

## 2019-10-21 DIAGNOSIS — Z720 Tobacco use: Secondary | ICD-10-CM

## 2019-10-21 DIAGNOSIS — I428 Other cardiomyopathies: Secondary | ICD-10-CM | POA: Insufficient documentation

## 2019-10-21 DIAGNOSIS — J449 Chronic obstructive pulmonary disease, unspecified: Secondary | ICD-10-CM | POA: Insufficient documentation

## 2019-10-21 DIAGNOSIS — Z794 Long term (current) use of insulin: Secondary | ICD-10-CM | POA: Insufficient documentation

## 2019-10-21 DIAGNOSIS — I10 Essential (primary) hypertension: Secondary | ICD-10-CM

## 2019-10-21 DIAGNOSIS — F17210 Nicotine dependence, cigarettes, uncomplicated: Secondary | ICD-10-CM | POA: Insufficient documentation

## 2019-10-21 DIAGNOSIS — E669 Obesity, unspecified: Secondary | ICD-10-CM | POA: Insufficient documentation

## 2019-10-21 DIAGNOSIS — I110 Hypertensive heart disease with heart failure: Secondary | ICD-10-CM | POA: Insufficient documentation

## 2019-10-21 DIAGNOSIS — G4733 Obstructive sleep apnea (adult) (pediatric): Secondary | ICD-10-CM | POA: Insufficient documentation

## 2019-10-21 DIAGNOSIS — Z7982 Long term (current) use of aspirin: Secondary | ICD-10-CM | POA: Insufficient documentation

## 2019-10-21 DIAGNOSIS — R0683 Snoring: Secondary | ICD-10-CM

## 2019-10-21 DIAGNOSIS — Z801 Family history of malignant neoplasm of trachea, bronchus and lung: Secondary | ICD-10-CM | POA: Insufficient documentation

## 2019-10-21 DIAGNOSIS — E119 Type 2 diabetes mellitus without complications: Secondary | ICD-10-CM | POA: Insufficient documentation

## 2019-10-21 DIAGNOSIS — I2510 Atherosclerotic heart disease of native coronary artery without angina pectoris: Secondary | ICD-10-CM | POA: Insufficient documentation

## 2019-10-21 LAB — ECHOCARDIOGRAM COMPLETE: S' Lateral: 2 cm

## 2019-10-21 NOTE — Patient Instructions (Signed)
Congratulations!!! [facility]  You should follow up with general cardiology in 1 year, they will contact you for this appointment closer to that time.

## 2019-10-21 NOTE — Progress Notes (Signed)
  Echocardiogram 2D Echocardiogram has been performed.  Felicia [name] 10/21/2019, 11:39 AM

## 2019-10-21 NOTE — Progress Notes (Signed)
Advanced Heart Failure Clinic Note   Date:  10/21/2019   ID:  Felicia [name], DOB [birth year], MRN [id number]  Location: Home  Provider location: [facility] Type of Visit: Established patient   PCP: None Cardiologist:  No primary care provider on file. Primary HF: Dr Haroldine Laws   Chief Complaint: Heart Failure   History of Present Illness:  Felicia [name] is a 59 y.o. female with obesity, tobacco abuse, COPD, probable OSA, IDDM, HTN and systolic NICM.  Admitted 5/19 with SOB and wheezing. Echo showed newly reduced EF 20-25% (previously normal 2013, but reportedly low in 2012). Underwent R/LHC that showed nonobstructive CAD and well compensated hemodynamics. She was also treated for COPD exacerbation with steroids and nebulizers. Diuresed with IV lasix. DC weight: 203 lbs.  Last saw Felicia [name] with televisit about a year ago. Feels good. Can do her activities without too much problem. No SOB, CP, edema, orthopnea and PND. Still smoking 5 cigs/day.    Sleep study 12/20 AHI 5.9 but during REM AHI 38  Echo today 10/21/19 EF 65% RV norma. Personally reviewed  LHC/RHC 05/2017   Prox LAD lesion is 20% stenosed.  Mid Cx lesion is 40% stenosed. Ao = 132/80 (100) LV = 127/19 RA = 7 RV = 38/8 PA = 39/12 (26) PCW = 14 Fick cardiac output/index = 5.3/2.9 PVR = 2.3 WU SVR = 1408 Ao sat = 93% PA sat = 66%, 68% Assessment: 1. Co-dominant coronary system with separate ostial for LAD and LCx 2. Minimal non-obstructive CAD 3. NICM EF 25-30%  4. Well-compensated hemodynamics  ECHO 2019 40-45% Grade IDD  Echo5/8/19LVEF 25-30%, Trivial MR, Mild TR, PA peak pressure 40 mm Hg   Past Medical History:  Diagnosis Date  . Cardiomyopathy   . CHF (congestive heart failure) (HCC)    minamal  . COPD (chronic obstructive pulmonary disease) ([place])   . DDD (degenerative disc disease), lumbar   . Diabetes mellitus   . Hypertension   . Non compliance w medication  regimen   . Normal echocardiogram    LVEF 25-30% 03/27/2010   Past Surgical History:  Procedure Laterality Date  . CARDIAC CATHETERIZATION    . RIGHT/LEFT HEART CATH AND CORONARY ANGIOGRAPHY N/A 05/29/2017   Procedure: RIGHT/LEFT HEART CATH AND CORONARY ANGIOGRAPHY;  Surgeon: Jolaine Artist, MD;  Location: [place] [facility];  Service: Cardiovascular;  Laterality: N/A;  . TUBAL LIGATION       Current Outpatient Medications  Medication Sig Dispense Refill  . acetaminophen (TYLENOL) 500 MG tablet Take 1,500 mg by mouth daily as needed for mild pain or headache.    . albuterol (VENTOLIN HFA) 108 (90 Base) MCG/ACT inhaler Inhale 2 puffs into the lungs every 6 (six) hours as needed for wheezing or shortness of breath. 18 g 2  . aspirin 81 MG chewable tablet Chew 81 mg by mouth daily.    Marland Kitchen bismuth subsalicylate (PEPTO BISMOL) 262 MG/15ML suspension Take 30 mLs by mouth every 4 (four) hours as needed for indigestion.     . Blood Glucose Monitoring Suppl (TRUE METRIX METER) w/Device KIT Use to check blood sugars up to 3 times per day 1 kit 0  . carvedilol (COREG) 6.25 MG tablet Take 1 tablet (6.25 mg total) by mouth 2 (two) times daily with a meal. 180 tablet 1  . diphenhydrAMINE (BENADRYL) 25 MG tablet Take 25 mg by mouth every 6 (six) hours as needed for allergies.     Marland Kitchen  diphenhydramine-acetaminophen (TYLENOL PM EXTRA STRENGTH) 25-500 MG TABS tablet Take 2 tablets by mouth at bedtime as needed (sleep).     . furosemide (LASIX) 20 MG tablet Take 1 tablet (20 mg total) by mouth daily. 90 tablet 3  . glucose blood (TRUE METRIX BLOOD GLUCOSE TEST) test strip Use to check blood sugars up to 3 times per day 100 each 12  . hydrALAZINE (APRESOLINE) 50 MG tablet Take 1 tablet (50 mg total) by mouth 3 (three) times daily. 90 tablet 6  . ibuprofen (ADVIL) 800 MG tablet Take 1 tablet (800 mg total) by mouth 3 (three) times daily. 21 tablet 0  . insulin NPH-regular Human (70-30) 100 UNIT/ML injection Inject  42 Units into the skin 2 (two) times daily with a meal. 10 mL 11  . isosorbide mononitrate (IMDUR) 30 MG 24 hr tablet Take 1 tablet (30 mg total) by mouth daily. 90 tablet 3  . Multiple Vitamin (MULTIVITAMIN WITH MINERALS) TABS tablet Take 1 tablet by mouth once a week.     . sacubitril-valsartan (ENTRESTO) 97-103 MG Take 1 tablet by mouth 2 (two) times daily. 180 tablet 3  . spironolactone (ALDACTONE) 25 MG tablet Take 1 tablet (25 mg total) by mouth daily. 90 tablet 3  . TRUEplus Lancets 28G MISC Use to check blood sugars 3 times per day 100 each 11  . guaiFENesin (MUCINEX) 600 MG 12 hr tablet Take 1 tablet (600 mg total) by mouth 2 (two) times daily. (Patient not taking: Reported on 03/25/2019) 30 tablet 0  . ipratropium-albuterol (DUONEB) 0.5-2.5 (3) MG/3ML SOLN Take 3 mLs by nebulization every 6 (six) hours as needed. 360 mL 1   No current facility-administered medications for this encounter.    Allergies:   Banana and Latex   Social History:  The patient  reports that she has been smoking cigarettes. She has been smoking about 0.50 packs per day. She has never used smokeless tobacco. She reports that she does not drink alcohol and does not use drugs.   Family History:  The patient's family history includes Lung cancer in her father; Stroke in her mother.   ROS:  Please see the history of present illness.   All other systems are personally reviewed and negative.   Vitals:   10/21/19 1136  BP: 102/64  Pulse: 95  SpO2: 96%  Weight: 91.9 kg (202 lb 9.6 oz)  Height: $Remove'4\' 11"'vxOxKdt$  (1.499 m)    Exam:   General:  Well appearing. No resp difficulty HEENT: normal Neck: supple. no JVD. Carotids 2+ bilat; no bruits. No lymphadenopathy or thryomegaly appreciated. Cor: PMI nondisplaced. Regular rate & rhythm. No rubs, gallops or murmurs. Lungs: clear Abdomen: obese soft, nontender, nondistended. No hepatosplenomegaly. No bruits or masses. Good bowel sounds. Extremities: no cyanosis, clubbing,  rash, edema Neuro: alert & orientedx3, cranial nerves grossly intact. moves all 4 extremities w/o difficulty. Affect pleasant   Recent Labs: 03/25/2019: ALT 19 08/26/2019: BUN 14; Creatinine, Ser 0.98; Potassium 4.6; Sodium 143  Personally reviewed   Wt Readings from Last 3 Encounters:  10/21/19 91.9 kg (202 lb 9.6 oz)  08/26/19 90.5 kg (199 lb 9.6 oz)  03/25/19 91.2 kg (201 lb)      ASSESSMENT AND PLAN:  1. Chronic systolic CHF - History of NICM. Echo in 2013 with normal EF. Suspect HTN cardiomyopathy (vs OSA) -Echo5/8/19LVEF 25-30%, Trivial MR, Mild TR, PA peak pressure 40 mm Hg - R/LHC 05/29/17 with well compensated hemodynamics and minimal, non-obstructive CAD.  EF 08/2017  40-45%  - Echo today 10/21/19 EF 65% RV ok Personally reviewed -  Doing well. NYHA II. Volume status stable.   - Continue coreg 6.25 mg BID. - Continue Entresto to 97/103 BID.  -Continue spiro 25 mg daily.  - Continue hydralazine to 50 mg tid. Continue imdur 30 mg daily. 2. Tobacco Abuse -Discussed need for smoking cessation 3. IDDM - Per PCP  - Consider SGLT2i 4. HTN - Blood pressure well controlled. Continue current regimen. 5. Snoring - sleep study with very mild OSA overall but severe OSA during REM sleep. Seen in sleep clinic and felt not to need CPAP  Can graduate from [facility]. F/u [facility] for yearly visit.    Signed, Glori Bickers, MD  10/21/2019 12:41 PM  [facility] [address] [facility] [place] [address] [phone] (office) [phone] (fax) .

## 2019-10-23 NOTE — Addendum Note (Signed)
Encounter addended by: Dolores Patty, MD on: 10/23/2019 4:07 PM  Actions taken: Level of Service modified, Visit diagnoses modified

## 2019-10-25 MED FILL — CARVEDILOL 6.25 MG TABLET: 6.25 | 30 days supply | Qty: 60 | Fill #1

## 2019-10-25 MED FILL — FUROSEMIDE 20 MG TABS: 20 | 30 days supply | Qty: 30 | Fill #1

## 2019-10-25 MED FILL — hydrALAZINE HCL 50 MG TABS: 50 | 30 days supply | Qty: 90 | Fill #1

## 2019-10-27 NOTE — Progress Notes (Deleted)
Cardiology Office Note:   Date:  10/27/2019  NAME:  Felicia [name]    MRN: [id number] DOB:  06-26-1960   PCP:  Cain Saupe, MD  Cardiologist:  No primary care provider on file.  Electrophysiologist:  None   Referring MD: Dolores Patty, MD   No chief complaint on file. ***  History of Present Illness:   Felicia [name] is a 58 y.o. female with a hx of systolic HF with recovery of EF, non-obstructive CAD, HTN, HLD, DM who presents for follow-up. Graduated from HF clinic.    Problem List 1. Non-ischemic CM with recovery of EF -EF 2019 -> 25-30% -EF 2021 ->65% -2/2 HTN 2. Non-obstructive CAD -minimal CAD -T chol 169, HDL 53, LDL 103, TG 64 3. HTN 4. COPD 5. DM -A1c 7.7  Past Medical History: Past Medical History:  Diagnosis Date  . Cardiomyopathy   . CHF (congestive heart failure) (HCC)    minamal  . COPD (chronic obstructive pulmonary disease) (HCC)   . DDD (degenerative disc disease), lumbar   . Diabetes mellitus   . Hypertension   . Non compliance w medication regimen   . Normal echocardiogram    LVEF 25-30% 03/27/2010    Past Surgical History: Past Surgical History:  Procedure Laterality Date  . CARDIAC CATHETERIZATION    . RIGHT/LEFT HEART CATH AND CORONARY ANGIOGRAPHY N/A 05/29/2017   Procedure: RIGHT/LEFT HEART CATH AND CORONARY ANGIOGRAPHY;  Surgeon: Dolores Patty, MD;  [facility];  Service: Cardiovascular;  Laterality: N/A;  . TUBAL LIGATION      Current Medications: No outpatient medications have been marked as taking for the 10/28/19 encounter (Appointment) with O'Neal, Ronnald Ramp, MD.     Allergies:    Banana and Latex   Social History: Social History   Socioeconomic History  . Marital status: Single    Spouse name: Not on file  . Number of children: 2  . Years of education: Not on file  . Highest education level: Not on file  Occupational History  . Not on file  Tobacco Use  . Smoking status:  Current Every Day Smoker    Packs/day: 0.50    Types: Cigarettes  . Smokeless tobacco: Never Used  Vaping Use  . Vaping Use: Never used  Substance and Sexual Activity  . Alcohol use: No  . Drug use: No  . Sexual activity: Yes    Birth control/protection: Surgical, Post-menopausal  Other Topics Concern  . Not on file  Social History Narrative  . Not on file   Social Determinants of Health   Financial Resource Strain:   . Difficulty of Paying Living Expenses: Not on file  Food Insecurity:   . Worried About Programme researcher, broadcasting/film/video in the Last Year: Not on file  . Ran Out of Food in the Last Year: Not on file  Transportation Needs:   . Lack of Transportation (Medical): Not on file  . Lack of Transportation (Non-Medical): Not on file  Physical Activity:   . Days of Exercise per Week: Not on file  . Minutes of Exercise per Session: Not on file  Stress:   . Feeling of Stress : Not on file  Social Connections:   . Frequency of Communication with Friends and Family: Not on file  . Frequency of Social Gatherings with Friends and Family: Not on file  . Attends Religious Services: Not on file  . Active Member of Clubs or Organizations: Not on file  .  Attends Banker Meetings: Not on file  . Marital Status: Not on file     Family History: The patient's ***family history includes Lung cancer in her father; Stroke in her mother.  ROS:   All other ROS reviewed and negative. Pertinent positives noted in the HPI.     EKGs/Labs/Other Studies Reviewed:   The following studies were personally reviewed by me today:  EKG:  EKG is *** ordered today.  The ekg ordered today demonstrates ***, and was personally reviewed by me.   TTE 10/21/2019  1. Left ventricular ejection fraction, by estimation, is 65 to 70%. The  left ventricle has normal function. The left ventricle has no regional  wall motion abnormalities. There is moderate left ventricular hypertrophy.  Left ventricular  diastolic  parameters are consistent with Grade I diastolic dysfunction (impaired  relaxation).  2. Right ventricular systolic function is normal. The right ventricular  size is normal.  3. The mitral valve is normal in structure. Trivial mitral valve  regurgitation.  4. The aortic valve is normal in structure. Aortic valve regurgitation is  not visualized.   LHC/RHC 05/2017 1. Co-dominant coronary system with separate ostial for LAD and LCx 2. Minimal non-obstructive CAD 3. NICM EF 25-30%  4. Well-compensated hemodynamics    Recent Labs: 03/25/2019: ALT 19 08/26/2019: BUN 14; Creatinine, Ser 0.98; Potassium 4.6; Sodium 143   Recent Lipid Panel    Component Value Date/Time   CHOL 169 05/28/2017 0230   TRIG 64 05/28/2017 0230   HDL 53 05/28/2017 0230   CHOLHDL 3.2 05/28/2017 0230   VLDL 13 05/28/2017 0230   LDLCALC 103 (H) 05/28/2017 0230    Physical Exam:   VS:  LMP 08/02/2010    Wt Readings from Last 3 Encounters:  10/21/19 202 lb 9.6 oz (91.9 kg)  08/26/19 199 lb 9.6 oz (90.5 kg)  03/25/19 201 lb (91.2 kg)    General: Well nourished, well developed, in no acute distress Heart: Atraumatic, normal size  Eyes: PEERLA, EOMI  Neck: Supple, no JVD Endocrine: No thryomegaly Cardiac: Normal S1, S2; RRR; no murmurs, rubs, or gallops Lungs: Clear to auscultation bilaterally, no wheezing, rhonchi or rales  Abd: Soft, nontender, no hepatomegaly  Ext: No edema, pulses 2+ Musculoskeletal: No deformities, BUE and BLE strength normal and equal Skin: Warm and dry, no rashes   Neuro: Alert and oriented to person, place, time, and situation, CNII-XII grossly intact, no focal deficits  Psych: Normal mood and affect   ASSESSMENT:   Felicia [name] is a 60 y.o. female who presents for the following: No diagnosis found.  PLAN:   There are no diagnoses linked to this encounter.  Disposition: No follow-ups on file.  Medication Adjustments/Labs and Tests Ordered: Current  medicines are reviewed at length with the patient today.  Concerns regarding medicines are outlined above.  No orders of the defined types were placed in this encounter.  No orders of the defined types were placed in this encounter.   There are no Patient Instructions on file for this visit.   Time Spent with Patient: I have spent a total of *** minutes with patient reviewing hospital notes, telemetry, EKGs, labs and examining the patient as well as establishing an assessment and plan that was discussed with the patient.  > 50% of time was spent in direct patient care.  Signed, Lenna Gilford. Flora Lipps, MD [facility]  [address] [place], [place] [address] [phone]  10/27/2019  8:48 PM

## 2019-10-28 ENCOUNTER — Ambulatory Visit: Payer: Self-pay | Admitting: Cardiovascular Disease

## 2019-11-05 ENCOUNTER — Encounter: Payer: Self-pay | Admitting: Family Medicine

## 2019-12-01 MED FILL — ISOSORBIDE MN ER 30 MG TAB: 30 | 30 days supply | Qty: 30 | Fill #2

## 2019-12-01 MED FILL — SPIRONOLACTONE 25 MG TABS: 25 | 30 days supply | Qty: 30 | Fill #2

## 2019-12-30 ENCOUNTER — Ambulatory Visit: Payer: Self-pay | Admitting: Family Medicine

## 2020-02-03 MED FILL — SPIRONOLACTONE 25 MG TABS: 25 | 30 days supply | Qty: 30 | Fill #3

## 2020-02-03 MED FILL — ISOSORBIDE MN ER 30 MG TAB: 30 | 30 days supply | Qty: 30 | Fill #3

## 2020-02-08 ENCOUNTER — Other Ambulatory Visit: Payer: Self-pay

## 2020-02-08 ENCOUNTER — Ambulatory Visit: Payer: Self-pay | Attending: Nurse Practitioner | Admitting: Nurse Practitioner

## 2020-02-08 ENCOUNTER — Encounter: Payer: Self-pay | Admitting: Nurse Practitioner

## 2020-02-08 VITALS — Ht 59.0 in | Wt 200.0 lb

## 2020-02-08 DIAGNOSIS — Z7689 Persons encountering health services in other specified circumstances: Secondary | ICD-10-CM

## 2020-02-08 DIAGNOSIS — E1165 Type 2 diabetes mellitus with hyperglycemia: Secondary | ICD-10-CM

## 2020-02-08 DIAGNOSIS — E785 Hyperlipidemia, unspecified: Secondary | ICD-10-CM

## 2020-02-08 DIAGNOSIS — R7989 Other specified abnormal findings of blood chemistry: Secondary | ICD-10-CM

## 2020-02-08 DIAGNOSIS — I10 Essential (primary) hypertension: Secondary | ICD-10-CM

## 2020-02-08 NOTE — Progress Notes (Signed)
Virtual Visit via Telephone Note Due to national recommendations of social distancing due to COVID 19, telehealth visit is felt to be most appropriate for this patient at this time.  I discussed the limitations, risks, security and privacy concerns of performing an evaluation and management service by telephone and the availability of in person appointments. I also discussed with the patient that there may be a patient responsible charge related to this service. The patient expressed understanding and agreed to proceed.    I connected with Felicia [name] on 02/08/20  at  11:10 AM EST  EDT by telephone and verified that I am speaking with the correct person using two identifiers.   Consent I discussed the limitations, risks, security and privacy concerns of performing an evaluation and management service by telephone and the availability of in person appointments. I also discussed with the patient that there may be a patient responsible charge related to this service. The patient expressed understanding and agreed to proceed.   Location of Patient: Private Residence   Location of Provider: [facility]    Persons participating in Telemedicine visit: Felicia Denver FNP-BC YY Suring CMA Felicia [name]    History of Present Illness: Telemedicine visit for: Establish Care Patient has been counseled on age-appropriate routine health concerns for screening and prevention. These are reviewed and up-to-date. Referrals have been placed accordingly. Immunizations are up-to-date or declined.    MAMMOGRAM: Unknown Date. Referred PAP: Overdue  She has a past medical history of Cardiomyopathy, CHF , COPD, DDD lumbar, Diabetes mellitus 2, Hypertension, Tobacco dependence, Non compliance w medication regimen, and Normal echocardiogram. She is an established patient with Cardiology -Tom[facility] Rumps. [place]  Essential Hypertension Well controlled. Currently taking carvedilol  6.25 mg BID, lasix 20 mg daily, hydralazine 50 mg TID, imdur 30 mg daily, spirinolactone 25 mg daily and entresto 97-103 mg daily. Denies chest pain, shortness of breath, palpitations, lightheadedness, dizziness, headaches or BLE edema.  BP Readings from Last 3 Encounters:  10/21/19 102/64  08/26/19 115/77  06/28/19 139/67   COPD Started smoking in her 20s (over 30 years ago). She is cutting back. Smoked 15 cigarettes last week. Wants to quit. She has only been prescribed prn SABA. Denies cough, wheezing or shortness of breath.   DM 2 She monitors her blood glucose levels a few times per week. Taking 42 units of NPH 70/30 twice a day. LDL not at goal. I have sent atorvastatin 20 mg to the pharmacy . She does not endorse a history of statin intolerance.  Lab Results  Component Value Date   HGBA1C 7.7 (A) 08/26/2019   Lab Results  Component Value Date   LDLCALC 103 (H) 05/28/2017    Past Medical History:  Diagnosis Date  . Cardiomyopathy   . CHF (congestive heart failure) (HCC)    minamal  . COPD (chronic obstructive pulmonary disease) (HCC)   . DDD (degenerative disc disease), lumbar   . Diabetes mellitus   . Hypertension   . Non compliance w medication regimen   . Normal echocardiogram    LVEF 25-30% 03/27/2010    Past Surgical History:  Procedure Laterality Date  . CARDIAC CATHETERIZATION    . RIGHT/LEFT HEART CATH AND CORONARY ANGIOGRAPHY N/A 05/29/2017   Procedure: RIGHT/LEFT HEART CATH AND CORONARY ANGIOGRAPHY;  Surgeon: Dolores Patty, MD;  [facility];  Service: Cardiovascular;  Laterality: N/A;  . TUBAL LIGATION      Family History  Problem Relation Age  of Onset  . Stroke Mother   . Lung cancer Father   . Diabetes Daughter     Social History   Socioeconomic History  . Marital status: Single    Spouse name: Not on file  . Number of children: 2  . Years of education: Not on file  . Highest education level: Not on file  Occupational History   . Not on file  Tobacco Use  . Smoking status: Current Every Day Smoker    Packs/day: 0.50    Types: Cigarettes  . Smokeless tobacco: Never Used  Vaping Use  . Vaping Use: Never used  Substance and Sexual Activity  . Alcohol use: No  . Drug use: No  . Sexual activity: Yes    Birth control/protection: Surgical, Post-menopausal  Other Topics Concern  . Not on file  Social History Narrative  . Not on file   Social Determinants of Health   Financial Resource Strain: Not on file  Food Insecurity: Not on file  Transportation Needs: Not on file  Physical Activity: Not on file  Stress: Not on file  Social Connections: Not on file     Observations/Objective: Awake, alert and oriented x 3   Review of Systems  Constitutional: Negative for fever, malaise/fatigue and weight loss.  HENT: Negative.  Negative for nosebleeds.   Eyes: Negative.  Negative for blurred vision, double vision and photophobia.  Respiratory: Negative.  Negative for cough and shortness of breath.   Cardiovascular: Negative.  Negative for chest pain, palpitations and leg swelling.  Gastrointestinal: Negative.  Negative for heartburn, nausea and vomiting.  Musculoskeletal: Negative.  Negative for myalgias.  Neurological: Negative.  Negative for dizziness, focal weakness, seizures and headaches.  Psychiatric/Behavioral: Negative.  Negative for suicidal ideas.    Assessment and Plan: Zandra was seen today for establish care.  Diagnoses and all orders for this visit:  Encounter to establish care  Essential hypertension Continue all antihypertensives as prescribed.  Remember to bring in your blood pressure log with you for your follow up appointment.  DASH/Mediterranean Diets are healthier choices for HTN.    Uncontrolled type 2 diabetes mellitus with hyperglycemia (HCC) Continue blood sugar control as discussed in office today, low carbohydrate diet, and regular physical exercise as tolerated, 150  minutes per week (30 min each day, 5 days per week, or 50 min 3 days per week). Keep blood sugar logs with fasting goal of 90-130 mg/dl, post prandial (after you eat) less than 180.  For Hypoglycemia: BS <60 and Hyperglycemia BS >400; contact the clinic ASAP. Annual eye exams and foot exams are recommended.   Dyslipidemia, goal LDL below 70 -     atorvastatin (LIPITOR) 20 MG tablet; Take 1 tablet (20 mg total) by mouth daily.     Follow Up Instructions Return in about 3 months (around 05/08/2020).     I discussed the assessment and treatment plan with the patient. The patient was provided an opportunity to ask questions and all were answered. The patient agreed with the plan and demonstrated an understanding of the instructions.   The patient was advised to call back or seek an in-person evaluation if the symptoms worsen or if the condition fails to improve as anticipated.  I provided 21 minutes of non-face-to-face time during this encounter including median intraservice time, reviewing previous notes, labs, imaging, medications and explaining diagnosis and management.  Claiborne Rigg, FNP-BC

## 2020-02-09 ENCOUNTER — Telehealth: Payer: Self-pay | Admitting: Nurse Practitioner

## 2020-02-09 NOTE — Telephone Encounter (Signed)
I have scheduled pt for her covid booster on 02/15/2020.

## 2020-02-10 ENCOUNTER — Encounter: Payer: Self-pay | Admitting: Nurse Practitioner

## 2020-02-10 ENCOUNTER — Other Ambulatory Visit: Payer: Self-pay | Admitting: Nurse Practitioner

## 2020-02-10 MED ORDER — ATORVASTATIN CALCIUM 20 MG PO TABS: 20.0000 mg | ORAL_TABLET | Freq: Every day | ORAL | 3 refills | Status: DC

## 2020-02-10 MED FILL — ATORVASTATIN CALCIUM 20 MG: 20 | 90 days supply | Qty: 90 | Fill #0

## 2020-02-15 ENCOUNTER — Ambulatory Visit: Payer: Self-pay | Attending: Nurse Practitioner

## 2020-02-15 ENCOUNTER — Ambulatory Visit: Payer: Self-pay | Attending: Internal Medicine

## 2020-02-15 ENCOUNTER — Other Ambulatory Visit: Payer: Self-pay

## 2020-02-15 DIAGNOSIS — R7989 Other specified abnormal findings of blood chemistry: Secondary | ICD-10-CM

## 2020-02-15 DIAGNOSIS — I10 Essential (primary) hypertension: Secondary | ICD-10-CM

## 2020-02-15 DIAGNOSIS — E785 Hyperlipidemia, unspecified: Secondary | ICD-10-CM

## 2020-02-15 DIAGNOSIS — E1165 Type 2 diabetes mellitus with hyperglycemia: Secondary | ICD-10-CM

## 2020-02-15 DIAGNOSIS — Z23 Encounter for immunization: Secondary | ICD-10-CM

## 2020-02-15 NOTE — Progress Notes (Signed)
   Covid-19 Vaccination Clinic  Name:  Felicia [name]    MRN: [id number] DOB: [birth year]  02/15/2020  [name] was observed post Covid-19 immunization for 15 minutes without incident. She was provided with Vaccine Information Sheet and instruction to access the V-Safe system.   [name] was instructed to call 911 with any severe reactions post vaccine: Marland Kitchen Difficulty breathing  . Swelling of face and throat  . A fast heartbeat  . A bad rash all over body  . Dizziness and weakness   Immunizations Administered    Name Date Dose VIS Date Route   PFIZER Comrnaty(Gray TOP) Covid-19 Vaccine 02/15/2020  1:48 PM 0.3 mL 12/30/2019 Intramuscular   Manufacturer: [employer], [employer]   Lot: [id number]   NDC: [phone]

## 2020-02-16 LAB — CBC
Hematocrit: 43.1 % (ref 34.0–46.6)
Hemoglobin: 14.1 g/dL (ref 11.1–15.9)
MCH: 29.6 pg (ref 26.6–33.0)
MCHC: 32.7 g/dL (ref 31.5–35.7)
MCV: 91 fL (ref 79–97)
Platelets: 377 10*3/uL (ref 150–450)
RBC: 4.76 x10E6/uL (ref 3.77–5.28)
RDW: 14.1 % (ref 11.7–15.4)
WBC: 10.8 10*3/uL (ref 3.4–10.8)

## 2020-02-16 LAB — LIPID PANEL
Chol/HDL Ratio: 3.3 ratio (ref 0.0–4.4)
Cholesterol, Total: 141 mg/dL (ref 100–199)
HDL: 43 mg/dL (ref >39)
LDL Chol Calc (NIH): 73 mg/dL (ref 0–99)
Triglycerides: 146 mg/dL (ref 0–149)
VLDL Cholesterol Cal: 25 mg/dL (ref 5–40)

## 2020-02-16 LAB — MICROALBUMIN / CREATININE URINE RATIO
Creatinine, Urine: 87.4 mg/dL
Microalb/Creat Ratio: 8 mg/g creat (ref 0–29)
Microalbumin, Urine: 6.7 ug/mL

## 2020-02-16 LAB — CMP14+EGFR
ALT: 32 IU/L (ref 0–32)
AST: 18 IU/L (ref 0–40)
Albumin/Globulin Ratio: 1.5 (ref 1.2–2.2)
Albumin: 4 g/dL (ref 3.8–4.9)
Alkaline Phosphatase: 112 IU/L (ref 44–121)
BUN/Creatinine Ratio: 15 (ref 9–23)
BUN: 14 mg/dL (ref 6–24)
Bilirubin Total: 0.2 mg/dL (ref 0.0–1.2)
CO2: 30 mmol/L — ABNORMAL HIGH (ref 20–29)
Calcium: 10.6 mg/dL — ABNORMAL HIGH (ref 8.7–10.2)
Chloride: 99 mmol/L (ref 96–106)
Creatinine, Ser: 0.92 mg/dL (ref 0.57–1.00)
GFR calc Af Amer: 79 mL/min/{1.73_m2} (ref >59)
GFR calc non Af Amer: 69 mL/min/{1.73_m2} (ref >59)
Globulin, Total: 2.7 g/dL (ref 1.5–4.5)
Glucose: 157 mg/dL — ABNORMAL HIGH (ref 65–99)
Potassium: 4.3 mmol/L (ref 3.5–5.2)
Sodium: 142 mmol/L (ref 134–144)
Total Protein: 6.7 g/dL (ref 6.0–8.5)

## 2020-02-16 LAB — HEMOGLOBIN A1C
Est. average glucose Bld gHb Est-mCnc: 206 mg/dL
Hgb A1c MFr Bld: 8.8 % — ABNORMAL HIGH (ref 4.8–5.6)

## 2020-02-24 ENCOUNTER — Other Ambulatory Visit: Payer: Self-pay | Admitting: Nurse Practitioner

## 2020-02-24 MED ORDER — TRULICITY 0.75 MG/0.5ML ~~LOC~~ SOAJ: 0.7500 mg | SUBCUTANEOUS | 1 refills | Status: DC

## 2020-03-07 ENCOUNTER — Other Ambulatory Visit: Payer: Self-pay | Admitting: Nurse Practitioner

## 2020-03-07 ENCOUNTER — Encounter: Payer: Self-pay | Admitting: Nurse Practitioner

## 2020-03-07 ENCOUNTER — Ambulatory Visit: Payer: Self-pay | Attending: Nurse Practitioner | Admitting: Nurse Practitioner

## 2020-03-07 ENCOUNTER — Other Ambulatory Visit: Payer: Self-pay

## 2020-03-07 VITALS — BP 121/84 | HR 107 | Temp 99.5°F | Ht <= 58 in | Wt 197.6 lb

## 2020-03-07 DIAGNOSIS — Z124 Encounter for screening for malignant neoplasm of cervix: Secondary | ICD-10-CM

## 2020-03-07 DIAGNOSIS — Z1231 Encounter for screening mammogram for malignant neoplasm of breast: Secondary | ICD-10-CM

## 2020-03-07 DIAGNOSIS — Z1211 Encounter for screening for malignant neoplasm of colon: Secondary | ICD-10-CM

## 2020-03-07 DIAGNOSIS — R6889 Other general symptoms and signs: Secondary | ICD-10-CM

## 2020-03-07 DIAGNOSIS — Z8679 Personal history of other diseases of the circulatory system: Secondary | ICD-10-CM

## 2020-03-07 DIAGNOSIS — E1165 Type 2 diabetes mellitus with hyperglycemia: Secondary | ICD-10-CM

## 2020-03-07 LAB — GLUCOSE, POCT (MANUAL RESULT ENTRY): POC Glucose: 219 mg/dl — AB (ref 70–99)

## 2020-03-07 LAB — POCT ABI - SCREENING FOR PILOT NO CHARGE: Left ABI: 1.08

## 2020-03-07 MED ORDER — TRULICITY 0.75 MG/0.5ML ~~LOC~~ SOAJ: 0.7500 mg | SUBCUTANEOUS | 1 refills | Status: AC

## 2020-03-07 MED FILL — TRULICITY 0.75 MG/0.5 ML PE: 0.75 | 28 days supply | Qty: 2 | Fill #0

## 2020-03-07 NOTE — Progress Notes (Signed)
Assessment & Plan:  Felicia [name] was seen today for gynecologic exam.  Diagnoses and all orders for this visit:  Encounter for Papanicolaou smear for cervical cancer screening -     Cytology - PAP -     Cervicovaginal ancillary only  Uncontrolled type 2 diabetes mellitus with hyperglycemia (HCC) -     Glucose (CBG) -     Dulaglutide (TRULICITY) 7.62 GB/1.5VV SOPN; Inject 0.75 mg into the skin once a week. NEEDS PASS  Colon cancer screening -     Fecal occult blood, imunochemical([facility])  Breast cancer screening by mammogram -     MM 3D SCREEN BREAST BILATERAL; Future  History of claudication -     POCT ABI Screening Pilot No Charge  Abnormal ankle brachial index (ABI) -     Ambulatory referral to Vascular Surgery    Patient has been counseled on age-appropriate routine health concerns for screening and prevention. These are reviewed and up-to-date. Referrals have been placed accordingly. Immunizations are up-to-date or declined.    Subjective:   Chief Complaint  Patient presents with  . Gynecologic Exam    Patient is here for pap smear exam.   HPI Felicia [name] 60 y.o. female presents to office today for pap smear.  She has a past medical history of Cardiomyopathy, CHF , COPD, DDD lumbar, Diabetes mellitus 2, Hypertension, Tobacco dependence, Non compliance w medication regimen, and Normal echocardiogram. She is an established patient with Cardiology -Dr. Haroldine Laws  She endorses symptoms of claudication including pain in the right leg when ambulating which lessens when activity is decreased. She does continue to smoke. Will check ABI.   Review of Systems  Constitutional: Negative for fever, malaise/fatigue and weight loss.  HENT: Negative.  Negative for nosebleeds.   Eyes: Negative.  Negative for blurred vision, double vision and photophobia.  Respiratory: Negative.  Negative for cough and shortness of breath.   Cardiovascular: Positive for claudication.  Negative for chest pain, palpitations and leg swelling.  Gastrointestinal: Negative.  Negative for heartburn, nausea and vomiting.  Musculoskeletal: Negative.  Negative for myalgias.  Neurological: Negative.  Negative for dizziness, focal weakness, seizures and headaches.  Psychiatric/Behavioral: Negative.  Negative for suicidal ideas.    Past Medical History:  Diagnosis Date  . Cardiomyopathy   . CHF (congestive heart failure) (HCC)    minamal  . COPD (chronic obstructive pulmonary disease) ([place])   . DDD (degenerative disc disease), lumbar   . Diabetes mellitus   . Hypertension   . Non compliance w medication regimen   . Normal echocardiogram    LVEF 25-30% 03/27/2010    Past Surgical History:  Procedure Laterality Date  . CARDIAC CATHETERIZATION    . RIGHT/LEFT HEART CATH AND CORONARY ANGIOGRAPHY N/A 05/29/2017   Procedure: RIGHT/LEFT HEART CATH AND CORONARY ANGIOGRAPHY;  Surgeon: Jolaine Artist, MD;  Location: [place] [facility];  Service: Cardiovascular;  Laterality: N/A;  . TUBAL LIGATION      Family History  Problem Relation Age of Onset  . Stroke Mother   . Lung cancer Father   . Diabetes Daughter     Social History Reviewed with no changes to be made today.   Outpatient Medications Prior to Visit  Medication Sig Dispense Refill  . acetaminophen (TYLENOL) 500 MG tablet Take 1,500 mg by mouth daily as needed for mild pain or headache.    . albuterol (VENTOLIN HFA) 108 (90 Base) MCG/ACT inhaler Inhale 2 puffs into the lungs every 6 (six) hours as  needed for wheezing or shortness of breath. 18 g 2  . aspirin 81 MG chewable tablet Chew 81 mg by mouth daily.    Marland Kitchen atorvastatin (LIPITOR) 20 MG tablet Take 1 tablet (20 mg total) by mouth daily. 90 tablet 3  . Blood Glucose Monitoring Suppl (TRUE METRIX METER) w/Device KIT Use to check blood sugars up to 3 times per day 1 kit 0  . carvedilol (COREG) 6.25 MG tablet Take 1 tablet (6.25 mg total) by mouth 2 (two) times daily  with a meal. 180 tablet 1  . diphenhydrAMINE (BENADRYL) 25 MG tablet Take 25 mg by mouth every 6 (six) hours as needed for allergies.     . diphenhydramine-acetaminophen (TYLENOL PM) 25-500 MG TABS tablet Take 2 tablets by mouth at bedtime as needed (sleep).     Marland Kitchen glucose blood (TRUE METRIX BLOOD GLUCOSE TEST) test strip Use to check blood sugars up to 3 times per day 100 each 12  . ibuprofen (ADVIL) 800 MG tablet Take 1 tablet (800 mg total) by mouth 3 (three) times daily. 21 tablet 0  . insulin NPH-regular Human (70-30) 100 UNIT/ML injection Inject 42 Units into the skin 2 (two) times daily with a meal. 10 mL 11  . isosorbide mononitrate (IMDUR) 30 MG 24 hr tablet Take 1 tablet (30 mg total) by mouth daily. 90 tablet 3  . Multiple Vitamin (MULTIVITAMIN WITH MINERALS) TABS tablet Take 1 tablet by mouth once a week.     . sacubitril-valsartan (ENTRESTO) 97-103 MG Take 1 tablet by mouth 2 (two) times daily. 180 tablet 3  . spironolactone (ALDACTONE) 25 MG tablet Take 1 tablet (25 mg total) by mouth daily. 90 tablet 3  . TRUEplus Lancets 28G MISC Use to check blood sugars 3 times per day 100 each 11  . bismuth subsalicylate (PEPTO BISMOL) 262 MG/15ML suspension Take 30 mLs by mouth every 4 (four) hours as needed for indigestion.     . furosemide (LASIX) 20 MG tablet Take 1 tablet (20 mg total) by mouth daily. 90 tablet 3  . hydrALAZINE (APRESOLINE) 50 MG tablet Take 1 tablet (50 mg total) by mouth 3 (three) times daily. 90 tablet 6  . ipratropium-albuterol (DUONEB) 0.5-2.5 (3) MG/3ML SOLN Take 3 mLs by nebulization every 6 (six) hours as needed. 360 mL 1  . Dulaglutide (TRULICITY) 1.61 WR/6.0AV SOPN Inject 0.75 mg into the skin once a week. (Patient not taking: Reported on 03/07/2020) 6 mL 1   No facility-administered medications prior to visit.    Allergies  Allergen Reactions  . Banana Itching  . Latex Itching       Objective:    BP 121/84 (BP Location: Right Arm, Patient Position:  Sitting, Cuff Size: Large)   Pulse (!) 107   Temp 99.5 F (37.5 C) (Oral)   Ht 4' 9.5" (1.461 m)   Wt 197 lb 9.6 oz (89.6 kg)   LMP 08/02/2010   SpO2 96%   BMI 42.02 kg/m  Wt Readings from Last 3 Encounters:  03/07/20 197 lb 9.6 oz (89.6 kg)  02/08/20 200 lb (90.7 kg)  10/21/19 202 lb 9.6 oz (91.9 kg)    Physical Exam Exam conducted with a chaperone present.  Constitutional:      Appearance: She is well-developed and well-nourished.  HENT:     Head: Normocephalic.  Cardiovascular:     Rate and Rhythm: Regular rhythm. Tachycardia present.     Heart sounds: Normal heart sounds.  Pulmonary:     Effort:  Pulmonary effort is normal.     Breath sounds: Normal breath sounds.  Abdominal:     General: Bowel sounds are normal.     Palpations: Abdomen is soft.     Hernia: There is no hernia in the right inguinal area or left inguinal area.  Genitourinary:    Exam position: Lithotomy position.     Labia: No labial fusion.        Right: No rash, tenderness, lesion or injury.        Left: No rash, tenderness, lesion or injury.      Vagina: Normal. No signs of injury and foreign body. No vaginal discharge, erythema, tenderness or bleeding.     Cervix: No cervical motion tenderness or friability.     Uterus: Normal. Not deviated and not enlarged.      Adnexa:        Right: No mass, tenderness or fullness.         Left: No mass, tenderness or fullness.       Rectum: Normal. No external hemorrhoid.  Lymphadenopathy:     Lower Body: No right inguinal and no right inguinal adenopathy. No left inguinal and no left inguinal adenopathy.  Skin:    General: Skin is warm and dry.  Neurological:     Mental Status: She is alert and oriented to person, place, and time.  Psychiatric:        Mood and Affect: Mood and affect normal.        Behavior: Behavior normal.        Thought Content: Thought content normal.        Judgment: Judgment normal.          Patient has been counseled  extensively about nutrition and exercise as well as the importance of adherence with medications and regular follow-up. The patient was given clear instructions to go to ER or return to medical center if symptoms don't improve, worsen or new problems develop. The patient verbalized understanding.   Follow-up: Return in about 3 months (around 06/04/2020).   Gildardo Pounds, FNP-BC [facility] [place], [place]   03/07/2020, 3:56 PM

## 2020-03-08 LAB — CERVICOVAGINAL ANCILLARY ONLY
Bacterial Vaginitis (gardnerella): POSITIVE — AB
Candida Glabrata: NEGATIVE
Candida Vaginitis: NEGATIVE
Chlamydia: NEGATIVE
Comment: NEGATIVE
Comment: NEGATIVE
Comment: NEGATIVE
Comment: NEGATIVE
Comment: NEGATIVE
Comment: NORMAL
Neisseria Gonorrhea: NEGATIVE
Trichomonas: NEGATIVE

## 2020-03-09 LAB — CYTOLOGY - PAP
Comment: NEGATIVE
Diagnosis: NEGATIVE
High risk HPV: NEGATIVE

## 2020-03-13 ENCOUNTER — Encounter: Payer: Self-pay | Admitting: Nurse Practitioner

## 2020-03-13 ENCOUNTER — Other Ambulatory Visit: Payer: Self-pay | Admitting: Nurse Practitioner

## 2020-03-13 MED ORDER — METRONIDAZOLE 500 MG PO TABS: 500.0000 mg | ORAL_TABLET | Freq: Two times a day (BID) | ORAL | 0 refills | Status: AC

## 2020-03-14 MED FILL — metroNIDAZOLE 500 MG TABS: 500 | 7 days supply | Qty: 14 | Fill #0

## 2020-03-29 MED FILL — SPIRONOLACTONE 25 MG TABS: 25 | 30 days supply | Qty: 30 | Fill #4

## 2020-03-29 MED FILL — ISOSORBIDE MN ER 30 MG TAB: 30 | 30 days supply | Qty: 30 | Fill #4

## 2020-04-03 ENCOUNTER — Other Ambulatory Visit: Payer: Self-pay

## 2020-04-03 DIAGNOSIS — R6889 Other general symptoms and signs: Secondary | ICD-10-CM

## 2020-04-07 ENCOUNTER — Ambulatory Visit (HOSPITAL_COMMUNITY)
Admission: RE | Admit: 2020-04-07 | Discharge: 2020-04-07 | Disposition: A | Discharge status: Home / Self Care | Payer: Medicaid Other | Source: Ambulatory Visit | Attending: Vascular Surgery | Admitting: Vascular Surgery

## 2020-04-07 ENCOUNTER — Ambulatory Visit (INDEPENDENT_AMBULATORY_CARE_PROVIDER_SITE_OTHER): Payer: Medicaid Other | Admitting: Vascular Surgery

## 2020-04-07 ENCOUNTER — Other Ambulatory Visit: Payer: Self-pay | Admitting: Vascular Surgery

## 2020-04-07 ENCOUNTER — Encounter: Payer: Self-pay | Admitting: Vascular Surgery

## 2020-04-07 ENCOUNTER — Other Ambulatory Visit: Payer: Self-pay

## 2020-04-07 VITALS — BP 159/82 | HR 100 | Temp 98.2°F | Resp 20 | Ht <= 58 in | Wt 201.0 lb

## 2020-04-07 DIAGNOSIS — I70213 Atherosclerosis of native arteries of extremities with intermittent claudication, bilateral legs: Secondary | ICD-10-CM | POA: Diagnosis not present

## 2020-04-07 DIAGNOSIS — R6889 Other general symptoms and signs: Secondary | ICD-10-CM | POA: Insufficient documentation

## 2020-04-07 MED ORDER — ROSUVASTATIN CALCIUM 10 MG PO TABS: 10.0000 mg | ORAL_TABLET | Freq: Every day | ORAL | 6 refills | Status: DC

## 2020-04-07 NOTE — Progress Notes (Signed)
Patient ID: Felicia [name], female   DOB: Feb 01, 1960, 60 y.o.   MRN: [id number]  Reason for Consult: New Patient (Initial Visit)   Referred by Gildardo Pounds, NP  Subjective:     HPI:  Felicia [name] is a 60 y.o. female history of chf found to have decreased ABIs at home testing.  She does have about 50 yards claudication cramping in her right calf greater than left.  She does not have any pain at rest.  She does have associated numbness in her right leg.  This all resolves with stopping and waiting for a couple minutes.  She denies tissue loss or ulceration.  She has never had stroke, TIA or amaurosis.  She does not have any personal family history of aneurysm disease.  She does have diabetes, hypertension and is a current every day smoker approximately half pack per day.  She does not take aspirin or any cholesterol medications.  Past Medical History:  Diagnosis Date  . Cardiomyopathy   . CHF (congestive heart failure) (HCC)    minamal  . COPD (chronic obstructive pulmonary disease) ([place])   . DDD (degenerative disc disease), lumbar   . Diabetes mellitus   . Hypertension   . Non compliance w medication regimen   . Normal echocardiogram    LVEF 25-30% 03/27/2010   Family History  Problem Relation Age of Onset  . Stroke Mother   . Lung cancer Father   . Diabetes Daughter    Past Surgical History:  Procedure Laterality Date  . CARDIAC CATHETERIZATION    . RIGHT/LEFT HEART CATH AND CORONARY ANGIOGRAPHY N/A 05/29/2017   Procedure: RIGHT/LEFT HEART CATH AND CORONARY ANGIOGRAPHY;  Surgeon: Jolaine Artist, MD;  Location: [place] [facility];  Service: Cardiovascular;  Laterality: N/A;  . TUBAL LIGATION      Short Social History:  Social History   Tobacco Use  . Smoking status: Current Every Day Smoker    Packs/day: 0.50    Types: Cigarettes  . Smokeless tobacco: Never Used  Substance Use Topics  . Alcohol use: No    Allergies  Allergen Reactions  . Banana  Itching  . Latex Itching    Current Outpatient Medications  Medication Sig Dispense Refill  . acetaminophen (TYLENOL) 500 MG tablet Take 1,500 mg by mouth daily as needed for mild pain or headache.    . albuterol (VENTOLIN HFA) 108 (90 Base) MCG/ACT inhaler Inhale 2 puffs into the lungs every 6 (six) hours as needed for wheezing or shortness of breath. 18 g 2  . aspirin 81 MG chewable tablet Chew 81 mg by mouth daily.    Marland Kitchen atorvastatin (LIPITOR) 20 MG tablet Take 1 tablet (20 mg total) by mouth daily. 90 tablet 3  . bismuth subsalicylate (PEPTO BISMOL) 262 MG/15ML suspension Take 30 mLs by mouth every 4 (four) hours as needed for indigestion.     . Blood Glucose Monitoring Suppl (TRUE METRIX METER) w/Device KIT Use to check blood sugars up to 3 times per day 1 kit 0  . carvedilol (COREG) 6.25 MG tablet Take 1 tablet (6.25 mg total) by mouth 2 (two) times daily with a meal. 180 tablet 1  . diphenhydrAMINE (BENADRYL) 25 MG tablet Take 25 mg by mouth every 6 (six) hours as needed for allergies.     . diphenhydramine-acetaminophen (TYLENOL PM) 25-500 MG TABS tablet Take 2 tablets by mouth at bedtime as needed (sleep).     . Dulaglutide (TRULICITY) 5.36 IW/8.0HO SOPN  Inject 0.75 mg into the skin once a week. NEEDS PASS 6 mL 1  . glucose blood (TRUE METRIX BLOOD GLUCOSE TEST) test strip Use to check blood sugars up to 3 times per day 100 each 12  . ibuprofen (ADVIL) 800 MG tablet Take 1 tablet (800 mg total) by mouth 3 (three) times daily. 21 tablet 0  . insulin NPH-regular Human (70-30) 100 UNIT/ML injection Inject 42 Units into the skin 2 (two) times daily with a meal. 10 mL 11  . isosorbide mononitrate (IMDUR) 30 MG 24 hr tablet Take 1 tablet (30 mg total) by mouth daily. 90 tablet 3  . Multiple Vitamin (MULTIVITAMIN WITH MINERALS) TABS tablet Take 1 tablet by mouth once a week.     . sacubitril-valsartan (ENTRESTO) 97-103 MG Take 1 tablet by mouth 2 (two) times daily. 180 tablet 3  .  spironolactone (ALDACTONE) 25 MG tablet Take 1 tablet (25 mg total) by mouth daily. 90 tablet 3  . TRUEplus Lancets 28G MISC Use to check blood sugars 3 times per day 100 each 11  . furosemide (LASIX) 20 MG tablet Take 1 tablet (20 mg total) by mouth daily. 90 tablet 3  . hydrALAZINE (APRESOLINE) 50 MG tablet Take 1 tablet (50 mg total) by mouth 3 (three) times daily. 90 tablet 6  . ipratropium-albuterol (DUONEB) 0.5-2.5 (3) MG/3ML SOLN Take 3 mLs by nebulization every 6 (six) hours as needed. 360 mL 1   No current facility-administered medications for this visit.    Review of Systems  Constitutional:  Constitutional negative. HENT: HENT negative.  Eyes: Eyes negative.  Respiratory: Respiratory negative.  Cardiovascular: Positive for claudication.  GI: Gastrointestinal negative.  Musculoskeletal: Musculoskeletal negative.  Skin: Skin negative.  Neurological: Positive for numbness.  Hematologic: Hematologic/lymphatic negative.  Psychiatric: Psychiatric negative.        Objective:  Objective   Vitals:   04/07/20 1336  BP: (!) 159/82  Pulse: 100  Resp: 20  Temp: 98.2 F (36.8 C)  SpO2: 95%    Physical Exam HENT:     Head: Normocephalic.     Nose:     Comments: Wearing a mask Eyes:     Pupils: Pupils are equal, round, and reactive to light.  Cardiovascular:     Rate and Rhythm: Normal rate.     Pulses:          Carotid pulses are 2+ on the right side and 2+ on the left side.      Radial pulses are 2+ on the right side and 2+ on the left side.       Popliteal pulses are 0 on the right side and 0 on the left side.  Pulmonary:     Effort: Pulmonary effort is normal.  Abdominal:     General: Abdomen is flat.     Palpations: Abdomen is soft.  Musculoskeletal:        General: No swelling. Normal range of motion.     Cervical back: Normal range of motion and neck supple.  Skin:    General: Skin is warm.     Capillary Refill: Capillary refill takes less than 2 seconds.   Neurological:     General: No focal deficit present.     Mental Status: She is alert.  Psychiatric:        Mood and Affect: Mood normal.        Behavior: Behavior normal.        Thought Content: Thought content normal.  Judgment: Judgment normal.     Data: ABI Findings:  +---------+------------------+-----+--------+--------+  Right  Rt Pressure (mmHg)IndexWaveformComment   +---------+------------------+-----+--------+--------+  Brachial 170                     +---------+------------------+-----+--------+--------+  PTA   92        0.54 biphasic      +---------+------------------+-----+--------+--------+  DP    86        0.51 biphasic      +---------+------------------+-----+--------+--------+  Great Toe62        0.36           +---------+------------------+-----+--------+--------+   +---------+------------------+-----+---------+-------+  Left   Lt Pressure (mmHg)IndexWaveform Comment  +---------+------------------+-----+---------+-------+  Brachial 170                      +---------+------------------+-----+---------+-------+  PTA   122        0.72 triphasic      +---------+------------------+-----+---------+-------+  DP    119        0.70 triphasic      +---------+------------------+-----+---------+-------+  G[place]iner Rhyme        0.69            +---------+------------------+-----+---------+-------+   +-------+-----------+-----------+------------+------------+  ABI/TBIToday's ABIToday's TBIPrevious ABIPrevious TBI  +-------+-----------+-----------+------------+------------+  Right 0.54    0.36                  +-------+-----------+-----------+------------+------------+  Left  0.72    0.69                   +-------+-----------+-----------+------------+------------+         Assessment/Plan:     60 year old female with 50 yard claudication.  Discussed with her that this presents a less than 5% risk of amputation in 5 years.  I also discussed the need for urgent smoking cessation and a walking program and we discussed this in detail.  We also discussed her complicating factors of obesity, hypertension and diabetes and the need to protect her feet at all times from ulceration.  She is going to attempt smoking cessation.  I have ordered Crestor 10 mg daily to start and sent this to her pharmacy.  I have also asked her to begin 81 mg aspirin daily.  She will follow-up in 6 months with repeat ABIs at which time hopefully she is smoking at least half of what she is today which would be 5 cigarettes daily.  Patient demonstrates good understanding.     Waynetta Sandy MD [facility]

## 2020-04-12 ENCOUNTER — Other Ambulatory Visit: Payer: Self-pay

## 2020-04-12 DIAGNOSIS — I70213 Atherosclerosis of native arteries of extremities with intermittent claudication, bilateral legs: Secondary | ICD-10-CM

## 2020-05-10 ENCOUNTER — Other Ambulatory Visit: Payer: Self-pay

## 2020-05-10 ENCOUNTER — Other Ambulatory Visit: Payer: Self-pay | Admitting: Nurse Practitioner

## 2020-05-10 DIAGNOSIS — E1165 Type 2 diabetes mellitus with hyperglycemia: Secondary | ICD-10-CM

## 2020-05-10 MED ORDER — INSULIN NPH ISOPHANE & REGULAR (70-30) 100 UNIT/ML ~~LOC~~ SUSP
42.0000 [IU] | Freq: Two times a day (BID) | SUBCUTANEOUS | 0 refills | Status: DC
  Filled 2020-05-10 – 2020-05-26 (×2): qty 10, 12d supply, fill #0

## 2020-05-10 NOTE — Telephone Encounter (Signed)
Copied from CRM [id number]. Topic: Quick Communication - Rx Refill/Question >> May 10, 2020 10:40 AM Gaetana Michaelis A wrote: Medication: insulin NPH-regular Human (70-30) 100 UNIT/ML   Has the patient contacted their pharmacy? No. Patient has been unable to contact their pharmacy directly.  Preferred Pharmacy (with phone number or street name): [facility]  Phone:  [phone] Fax:  [phone]  Agent: Please be advised that RX refills may take up to 3 business days. We ask that you follow-up with your pharmacy.

## 2020-05-17 ENCOUNTER — Other Ambulatory Visit: Payer: Self-pay

## 2020-05-23 ENCOUNTER — Other Ambulatory Visit (HOSPITAL_COMMUNITY): Payer: Self-pay

## 2020-05-23 MED FILL — Isosorbide Mononitrate Tab ER 24HR 30 MG: ORAL | 90 days supply | Qty: 90 | Fill #0 | Status: AC

## 2020-05-23 MED FILL — Carvedilol Tab 6.25 MG: ORAL | 90 days supply | Qty: 180 | Fill #0 | Status: AC

## 2020-05-23 MED FILL — Spironolactone Tab 25 MG: ORAL | 90 days supply | Qty: 90 | Fill #0 | Status: AC

## 2020-05-26 ENCOUNTER — Other Ambulatory Visit: Payer: Self-pay

## 2020-06-01 ENCOUNTER — Ambulatory Visit
Admission: RE | Admit: 2020-06-01 | Discharge: 2020-06-01 | Disposition: A | Discharge status: Home / Self Care | Payer: Medicaid Other | Source: Ambulatory Visit | Attending: Nurse Practitioner | Admitting: Nurse Practitioner

## 2020-06-01 ENCOUNTER — Other Ambulatory Visit: Payer: Self-pay

## 2020-06-01 DIAGNOSIS — Z1231 Encounter for screening mammogram for malignant neoplasm of breast: Secondary | ICD-10-CM

## 2020-06-05 ENCOUNTER — Other Ambulatory Visit (HOSPITAL_COMMUNITY): Payer: Self-pay

## 2020-06-05 MED FILL — Rosuvastatin Calcium Tab 10 MG: ORAL | 30 days supply | Qty: 30 | Fill #0 | Status: AC

## 2020-06-07 ENCOUNTER — Encounter: Payer: Self-pay | Admitting: Nurse Practitioner

## 2020-06-07 ENCOUNTER — Other Ambulatory Visit: Payer: Self-pay

## 2020-06-07 ENCOUNTER — Ambulatory Visit: Payer: Medicaid Other | Attending: Nurse Practitioner | Admitting: Nurse Practitioner

## 2020-06-07 VITALS — BP 113/76 | HR 115 | Resp 16 | Wt 201.2 lb

## 2020-06-07 DIAGNOSIS — E1169 Type 2 diabetes mellitus with other specified complication: Secondary | ICD-10-CM | POA: Diagnosis not present

## 2020-06-07 DIAGNOSIS — Z794 Long term (current) use of insulin: Secondary | ICD-10-CM | POA: Diagnosis not present

## 2020-06-07 DIAGNOSIS — I10 Essential (primary) hypertension: Secondary | ICD-10-CM

## 2020-06-07 DIAGNOSIS — E785 Hyperlipidemia, unspecified: Secondary | ICD-10-CM | POA: Diagnosis not present

## 2020-06-07 DIAGNOSIS — E1165 Type 2 diabetes mellitus with hyperglycemia: Secondary | ICD-10-CM

## 2020-06-07 LAB — POCT GLYCOSYLATED HEMOGLOBIN (HGB A1C): HbA1c, POC (controlled diabetic range): 10.9 % — AB (ref 0.0–7.0)

## 2020-06-07 LAB — GLUCOSE, POCT (MANUAL RESULT ENTRY): POC Glucose: 253 mg/dl — AB (ref 70–99)

## 2020-06-07 MED ORDER — TRULICITY 0.75 MG/0.5ML ~~LOC~~ SOAJ
0.7500 mg | SUBCUTANEOUS | 3 refills | Status: AC
  Filled 2020-06-07 – 2020-06-21 (×2): qty 2, 28d supply, fill #0
  Filled 2020-07-19 – 2020-08-02 (×2): qty 2, 28d supply, fill #1
  Filled 2020-09-11: qty 2, 28d supply, fill #2
  Filled 2020-11-01 – 2020-11-14 (×2): qty 2, 28d supply, fill #3

## 2020-06-07 MED ORDER — INSULIN SYRINGES (DISPOSABLE) U-100 0.5 ML MISC
6 refills | Status: DC
  Filled 2020-06-07: qty 200, fill #0

## 2020-06-07 MED ORDER — HUMULIN 70/30 (70-30) 100 UNIT/ML ~~LOC~~ SUSP
42.0000 [IU] | Freq: Two times a day (BID) | SUBCUTANEOUS | 1 refills | Status: DC
  Filled 2020-06-07 – 2021-03-14 (×3): qty 70, 83d supply, fill #0

## 2020-06-07 NOTE — Progress Notes (Signed)
 Assessment & Plan:  Felicia [name] was seen today for diabetes and hypertension.  Diagnoses and all orders for this visit:  Type 2 diabetes mellitus with other specified complication, with long-term current use of insulin (HCC) -     POCT glucose (manual entry) -     POCT glycosylated hemoglobin (Hb A1C) -     Dulaglutide (TRULICITY) 0.75 MG/0.5ML SOPN; Inject 0.75 mg into the skin once a week. -     insulin NPH-regular Human (HUMULIN 70/30) (70-30) 100 UNIT/ML injection; Inject 42 Units into the skin 2 (two) times daily with a meal. -     Ambulatory referral to Ophthalmology -     Basic metabolic panel -     Ambulatory referral to Podiatry -     Insulin Syringes, Disposable, U-100 0.5 ML MISC; Inject into the skin twice per day. E11.65  Hyperlipidemia associated with type 2 diabetes mellitus (HCC) Continue crestor as prescribed INSTRUCTIONS: Work on a low fat, heart healthy diet and participate in regular aerobic exercise program by working out at least 150 minutes per week; 5 days a week-30 minutes per day. Avoid red meat/beef/steak,  fried foods. junk foods, sodas, sugary drinks, unhealthy snacking, alcohol and smoking.  Drink at least 80 oz of water per day and monitor your carbohydrate intake daily.    Primary hypertension Continue all antihypertensives as prescribed.  Remember to bring in your blood pressure log with you for your follow up appointment.  DASH/Mediterranean Diets are healthier choices for HTN.      Patient has been counseled on age-appropriate routine health concerns for screening and prevention. These are reviewed and up-to-date. Referrals have been placed accordingly. Immunizations are up-to-date or declined.    Subjective:   Chief Complaint  Patient presents with  . Diabetes  . Hypertension   HPI [name] 60 y.o. female presents to office today for follow up.  She has a past medical history of Cardiomyopathy, CHF, COPD, DDD, lumbar, DM2,  Hypertension, Tobacco dependence, Non compliance w medication regimen, and Normal echocardiogram.   Being followed by Cardiology for CHF, bilateral LE claudication   HTN Well controlled. Taking carvedilol 6.25 mg BID, furosemide 20 mg daily , imdur 30 mg daily, entresto 97-103 mg daily and spironolactone 25 mg daily. Denies chest pain, shortness of breath, palpitations, lightheadedness, dizziness, headaches or BLE edema.  BP Readings from Last 3 Encounters:  06/07/20 113/76  04/07/20 (!) 159/82  03/07/20 121/84   DM2 Poorly controlled. She is only administering 40 units BID of 70/30 instead of 42 units BID. Also has not been taking Trulicity 0.75 mg weekly as prescribed. LDL near optimal with atorvastatin 20 mg daily.  She is morbidly obese. Lowering risk factors for complications would include: smoking cessation, weight loss with dietary modifications, and daily exercise regimen Lab Results  Component Value Date   HGBA1C 10.9 (A) 06/07/2020   Lab Results  Component Value Date   HGBA1C 8.8 (H) 02/15/2020   Lab Results  Component Value Date   LDLCALC 73 02/15/2020    Review of Systems  Constitutional: Negative for fever, malaise/fatigue and weight loss.  HENT: Negative.  Negative for nosebleeds.   Eyes: Negative.  Negative for blurred vision, double vision and photophobia.  Respiratory: Negative.  Negative for cough and shortness of breath.   Cardiovascular: Negative.  Negative for chest pain, palpitations and leg swelling.  Gastrointestinal: Negative.  Negative for heartburn, nausea and vomiting.  Musculoskeletal: Negative.  Negative for myalgias.  Neurological: Negative.    Negative for dizziness, focal weakness, seizures and headaches.  Psychiatric/Behavioral: Negative.  Negative for suicidal ideas.    Past Medical History:  Diagnosis Date  . Cardiomyopathy   . CHF (congestive heart failure) (HCC)    minamal  . COPD (chronic obstructive pulmonary disease) (HCC)   . DDD  (degenerative disc disease), lumbar   . Diabetes mellitus   . Hypertension   . Non compliance w medication regimen   . Normal echocardiogram    LVEF 25-30% 03/27/2010    Past Surgical History:  Procedure Laterality Date  . CARDIAC CATHETERIZATION    . RIGHT/LEFT HEART CATH AND CORONARY ANGIOGRAPHY N/A 05/29/2017   Procedure: RIGHT/LEFT HEART CATH AND CORONARY ANGIOGRAPHY;  Surgeon: Bensimhon, Daniel R, MD;  Location: [facility];  Service: Cardiovascular;  Laterality: N/A;  . TUBAL LIGATION      Family History  Problem Relation Age of Onset  . Stroke Mother   . Lung cancer Father   . Diabetes Daughter     Social History Reviewed with no changes to be made today.   Outpatient Medications Prior to Visit  Medication Sig Dispense Refill  . acetaminophen (TYLENOL) 500 MG tablet Take 1,500 mg by mouth daily as needed for mild pain or headache.    . albuterol (VENTOLIN HFA) 108 (90 Base) MCG/ACT inhaler Inhale 2 puffs into the lungs every 6 (six) hours as needed for wheezing or shortness of breath. 18 g 2  . aspirin 81 MG chewable tablet Chew 81 mg by mouth daily.    . Blood Glucose Monitoring Suppl (TRUE METRIX METER) w/Device KIT Use to check blood sugars up to 3 times per day 1 kit 0  . carvedilol (COREG) 6.25 MG tablet TAKE 1 TABLET BY MOUTH 2 TIMES DAILY WITH A MEAL. 180 tablet 1  . diphenhydrAMINE (BENADRYL) 25 MG tablet Take 25 mg by mouth every 6 (six) hours as needed for allergies.     . diphenhydramine-acetaminophen (TYLENOL PM) 25-500 MG TABS tablet Take 2 tablets by mouth at bedtime as needed (sleep).     . furosemide (LASIX) 20 MG tablet TAKE 1 TABLET (20 MG TOTAL) BY MOUTH DAILY. 90 tablet 3  . glucose blood (TRUE METRIX BLOOD GLUCOSE TEST) test strip Use to check blood sugars up to 3 times per day 100 each 12  . hydrALAZINE (APRESOLINE) 50 MG tablet TAKE 1 TABLET BY MOUTH 3 TIMES DAILY. 90 tablet 6  . ibuprofen (ADVIL) 800 MG tablet Take 1 tablet (800 mg total) by  mouth 3 (three) times daily. 21 tablet 0  . ipratropium-albuterol (DUONEB) 0.5-2.5 (3) MG/3ML SOLN Take 3 mLs by nebulization every 6 (six) hours as needed. 360 mL 1  . isosorbide mononitrate (IMDUR) 30 MG 24 hr tablet TAKE 1 TABLET BY MOUTH DAILY. 90 tablet 3  . metroNIDAZOLE (FLAGYL) 500 MG tablet TAKE 1 TABLET (500 MG TOTAL) BY MOUTH 2 (TWO) TIMES DAILY FOR 7 DAYS. 14 tablet 0  . Multiple Vitamin (MULTIVITAMIN WITH MINERALS) TABS tablet Take 1 tablet by mouth once a week.     . rosuvastatin (CRESTOR) 10 MG tablet TAKE 1 TABLET (10 MG TOTAL) BY MOUTH DAILY. 30 tablet 6  . sacubitril-valsartan (ENTRESTO) 97-103 MG Take 1 tablet by mouth 2 (two) times daily. 180 tablet 3  . spironolactone (ALDACTONE) 25 MG tablet TAKE 1 TABLET BY MOUTH DAILY. 90 tablet 3  . TRUEplus Lancets 28G MISC Use to check blood sugars 3 times per day 100 each 11  . atorvastatin (  LIPITOR) 20 MG tablet TAKE 1 TABLET (20 MG TOTAL) BY MOUTH DAILY. 90 tablet 3  . bismuth subsalicylate (PEPTO BISMOL) 262 MG/15ML suspension Take 30 mLs by mouth every 4 (four) hours as needed for indigestion.     . Dulaglutide 0.75 MG/0.5ML SOPN INJECT 0.75 MG INTO THE SKIN ONCE A WEEK. 6 mL 1  . insulin NPH-regular Human (70-30) 100 UNIT/ML injection Inject 42 Units into the skin 2 (two) times daily with a meal. 10 mL 0   No facility-administered medications prior to visit.    Allergies  Allergen Reactions  . Banana Itching  . Latex Itching       Objective:    BP 113/76   Pulse (!) 115   Resp 16   Wt 201 lb 3.2 oz (91.3 kg)   LMP 08/02/2010   SpO2 96%   BMI 42.79 kg/m  Wt Readings from Last 3 Encounters:  06/07/20 201 lb 3.2 oz (91.3 kg)  04/07/20 201 lb (91.2 kg)  03/07/20 197 lb 9.6 oz (89.6 kg)    Physical Exam Vitals and nursing note reviewed.  Constitutional:      Appearance: She is well-developed.  HENT:     Head: Normocephalic and atraumatic.  Cardiovascular:     Rate and Rhythm: Normal rate and regular rhythm.      Heart sounds: Normal heart sounds. No murmur heard. No friction rub. No gallop.   Pulmonary:     Effort: Pulmonary effort is normal. No tachypnea or respiratory distress.     Breath sounds: Normal breath sounds. No decreased breath sounds, wheezing, rhonchi or rales.  Chest:     Chest wall: No tenderness.  Abdominal:     General: Bowel sounds are normal.     Palpations: Abdomen is soft.  Musculoskeletal:        General: Normal range of motion.     Cervical back: Normal range of motion.  Skin:    General: Skin is warm and dry.  Neurological:     Mental Status: She is alert and oriented to person, place, and time.     Coordination: Coordination normal.  Psychiatric:        Behavior: Behavior normal. Behavior is cooperative.        Thought Content: Thought content normal.        Judgment: Judgment normal.          Patient has been counseled extensively about nutrition and exercise as well as the importance of adherence with medications and regular follow-up. The patient was given clear instructions to go to ER or return to medical center if symptoms don't improve, worsen or new problems develop. The patient verbalized understanding.   Follow-up: Return for 4 weeks meter check. See me in 3 months.   Gildardo Pounds, FNP-BC [facility] [place], [place]   06/08/2020, 5:35 PM

## 2020-06-08 ENCOUNTER — Encounter: Payer: Self-pay | Admitting: Nurse Practitioner

## 2020-06-08 ENCOUNTER — Other Ambulatory Visit: Payer: Self-pay

## 2020-06-08 LAB — BASIC METABOLIC PANEL
BUN/Creatinine Ratio: 18 (ref 9–23)
BUN: 16 mg/dL (ref 6–24)
CO2: 25 mmol/L (ref 20–29)
Calcium: 10.6 mg/dL — ABNORMAL HIGH (ref 8.7–10.2)
Chloride: 100 mmol/L (ref 96–106)
Creatinine, Ser: 0.91 mg/dL (ref 0.57–1.00)
Glucose: 256 mg/dL — ABNORMAL HIGH (ref 65–99)
Potassium: 4.4 mmol/L (ref 3.5–5.2)
Sodium: 141 mmol/L (ref 134–144)
eGFR: 73 mL/min/{1.73_m2} (ref >59)

## 2020-06-14 ENCOUNTER — Other Ambulatory Visit: Payer: Self-pay

## 2020-06-15 ENCOUNTER — Other Ambulatory Visit: Payer: Self-pay

## 2020-06-21 ENCOUNTER — Other Ambulatory Visit: Payer: Self-pay

## 2020-06-22 ENCOUNTER — Other Ambulatory Visit: Payer: Self-pay

## 2020-06-22 ENCOUNTER — Telehealth: Payer: Self-pay | Admitting: Nurse Practitioner

## 2020-06-22 NOTE — Telephone Encounter (Signed)
Pt would like provider to give a referral to the dentist. Please follow up with pt regarding issue.

## 2020-06-23 NOTE — Telephone Encounter (Signed)
Please contact pt and see why she is needing a referral to dentist

## 2020-06-27 ENCOUNTER — Other Ambulatory Visit: Payer: Self-pay

## 2020-06-27 ENCOUNTER — Encounter: Payer: Self-pay | Admitting: Podiatry

## 2020-06-27 ENCOUNTER — Ambulatory Visit (INDEPENDENT_AMBULATORY_CARE_PROVIDER_SITE_OTHER): Payer: Medicaid Other | Admitting: Podiatry

## 2020-06-27 DIAGNOSIS — M79674 Pain in right toe(s): Secondary | ICD-10-CM | POA: Diagnosis not present

## 2020-06-27 DIAGNOSIS — E119 Type 2 diabetes mellitus without complications: Secondary | ICD-10-CM | POA: Diagnosis not present

## 2020-06-27 DIAGNOSIS — M79675 Pain in left toe(s): Secondary | ICD-10-CM | POA: Diagnosis not present

## 2020-06-27 DIAGNOSIS — B351 Tinea unguium: Secondary | ICD-10-CM | POA: Diagnosis not present

## 2020-06-27 DIAGNOSIS — N179 Acute kidney failure, unspecified: Secondary | ICD-10-CM | POA: Diagnosis not present

## 2020-06-27 NOTE — Progress Notes (Signed)
This patient presents to the office with chief complaint of long thick deformed big toenails both feet.  She says she had surgery performed years ago and both great toenails were removed and she asked for the nails to regrow.  The nails have returned and are deformed  The nails become painful when the nails are thick.  She says she is diabetic.  She presents for evaluation and treatment.    General Appearance  Alert, conversant and in no acute stress.  Vascular  Dorsalis pedis and posterior tibial  pulses are palpable  bilaterally.  Capillary return is within normal limits  bilaterally. Temperature is within normal limits  bilaterally.  Neurologic  Senn-Weinstein monofilament wire test within normal limits  bilaterally. Muscle power within normal limits bilaterally.  Nails Thick disfigured discolored nails with subungual debris   hallux  bilaterally and second toenail left foot.. No evidence of bacterial infection or drainage bilaterally.  Orthopedic  No limitations of motion  feet .  No crepitus or effusions noted.  No bony pathology or digital deformities noted.  Skin  normotropic skin with no porokeratosis noted bilaterally.  No signs of infections or ulcers noted.   Onychomycosis  B/L  IE.  Debrided nails with nail nipper followed by dremel tool usage.  Discussed future nail removal with this patient.  Patient to call and schedule her nail surgery.   Helane Gunther DPM

## 2020-07-07 ENCOUNTER — Other Ambulatory Visit (HOSPITAL_COMMUNITY): Payer: Self-pay

## 2020-07-19 ENCOUNTER — Other Ambulatory Visit: Payer: Self-pay

## 2020-07-26 ENCOUNTER — Other Ambulatory Visit: Payer: Self-pay

## 2020-08-02 ENCOUNTER — Other Ambulatory Visit: Payer: Self-pay

## 2020-08-02 ENCOUNTER — Other Ambulatory Visit (HOSPITAL_COMMUNITY): Payer: Self-pay

## 2020-08-02 MED FILL — Rosuvastatin Calcium Tab 10 MG: ORAL | 30 days supply | Qty: 30 | Fill #1 | Status: AC

## 2020-08-04 ENCOUNTER — Other Ambulatory Visit (HOSPITAL_COMMUNITY): Payer: Self-pay

## 2020-08-04 DIAGNOSIS — I10 Essential (primary) hypertension: Secondary | ICD-10-CM

## 2020-08-04 MED ORDER — ENTRESTO 97-103 MG PO TABS: 1.0000 | ORAL_TABLET | Freq: Two times a day (BID) | ORAL | 0 refills | Status: DC

## 2020-08-10 ENCOUNTER — Other Ambulatory Visit (HOSPITAL_COMMUNITY): Payer: Self-pay

## 2020-08-10 MED ORDER — LATANOPROST 0.005 % OP SOLN
1.0000 [drp] | Freq: Every day | OPHTHALMIC | 4 refills | Status: DC
Start: 2020-08-10 — End: 2022-01-04
  Filled 2020-08-10: qty 7.5, 75d supply, fill #0

## 2020-08-14 ENCOUNTER — Other Ambulatory Visit (HOSPITAL_COMMUNITY): Payer: Self-pay

## 2020-08-31 ENCOUNTER — Telehealth (HOSPITAL_COMMUNITY): Payer: Self-pay | Admitting: Pharmacy Technician

## 2020-08-31 ENCOUNTER — Other Ambulatory Visit (HOSPITAL_COMMUNITY): Payer: Self-pay

## 2020-08-31 NOTE — Telephone Encounter (Signed)
Advanced Heart Failure Patient Advocate Encounter  Prior Authorization for Sherryll Burger has been approved.    PA#  [id number] Effective dates: 08/31/20 through 08/31/21  Patients co-pay is $4 (90 days)

## 2020-08-31 NOTE — Telephone Encounter (Signed)
Advanced Heart Failure Patient Advocate Encounter  I received a Statistician for [employer].   Upon investigation, patient currently has [employer]. Will obtain PA approval. Should not need to renew assistance.    Received notification from [facility] that prior authorization for Sherryll Burger is required.   PA submitted on CoverMyMeds Key [id number] Status is pending   Will continue to follow.

## 2020-09-11 ENCOUNTER — Other Ambulatory Visit (HOSPITAL_COMMUNITY): Payer: Self-pay

## 2020-09-11 ENCOUNTER — Other Ambulatory Visit: Payer: Self-pay

## 2020-09-12 ENCOUNTER — Other Ambulatory Visit: Payer: Self-pay

## 2020-10-03 ENCOUNTER — Encounter: Payer: Self-pay | Admitting: Podiatry

## 2020-10-03 ENCOUNTER — Other Ambulatory Visit: Payer: Self-pay

## 2020-10-03 ENCOUNTER — Ambulatory Visit: Payer: Medicaid Other | Admitting: Podiatry

## 2020-10-03 DIAGNOSIS — B351 Tinea unguium: Secondary | ICD-10-CM

## 2020-10-03 DIAGNOSIS — N179 Acute kidney failure, unspecified: Secondary | ICD-10-CM

## 2020-10-03 DIAGNOSIS — M79675 Pain in left toe(s): Secondary | ICD-10-CM

## 2020-10-03 DIAGNOSIS — E119 Type 2 diabetes mellitus without complications: Secondary | ICD-10-CM | POA: Diagnosis not present

## 2020-10-03 DIAGNOSIS — M79674 Pain in right toe(s): Secondary | ICD-10-CM

## 2020-10-03 NOTE — Progress Notes (Signed)
This patient presents to the office with chief complaint of long thick deformed big toenails both feet.  She says she had surgery performed years ago and both great toenails were removed and she asked for the nails to regrow.  The nails have returned and are deformed  The nails become painful when the nails are thick.  She says she is diabetic.  She presents for evaluation and treatment.    General Appearance  Alert, conversant and in no acute stress.  Vascular  Dorsalis pedis and posterior tibial  pulses are palpable  bilaterally.  Capillary return is within normal limits  bilaterally. Temperature is within normal limits  bilaterally.  Neurologic  Senn-Weinstein monofilament wire test within normal limits  bilaterally. Muscle power within normal limits bilaterally.  Nails Thick disfigured discolored nails with subungual debris   hallux  bilaterally and second toenail left foot.. No evidence of bacterial infection or drainage bilaterally.  Orthopedic  No limitations of motion  feet .  No crepitus or effusions noted.  No bony pathology or digital deformities noted.  Skin  normotropic skin with no porokeratosis noted bilaterally.  No signs of infections or ulcers noted.   Onychomycosis  B/L  Debrided nails with nail nipper followed by dremel tool usage.  Discussed future nail removal with this patient.   RTC 3 months.   Helane Gunther DPM

## 2020-10-05 ENCOUNTER — Other Ambulatory Visit (HOSPITAL_COMMUNITY): Payer: Self-pay

## 2020-10-05 ENCOUNTER — Other Ambulatory Visit: Payer: Self-pay | Admitting: Physician Assistant

## 2020-10-05 DIAGNOSIS — I5022 Chronic systolic (congestive) heart failure: Secondary | ICD-10-CM

## 2020-10-05 MED ORDER — SPIRONOLACTONE 25 MG PO TABS
25.0000 mg | ORAL_TABLET | Freq: Every day | ORAL | 3 refills | Status: DC
  Filled 2020-10-05: qty 90, 90d supply, fill #0
  Filled 2021-03-30: qty 90, 90d supply, fill #1
  Filled 2021-07-19 – 2021-09-10 (×2): qty 90, 90d supply, fill #2

## 2020-10-05 MED ORDER — ISOSORBIDE MONONITRATE ER 30 MG PO TB24
30.0000 mg | ORAL_TABLET | Freq: Every day | ORAL | 0 refills | Status: DC
  Filled 2020-10-05: qty 90, 90d supply, fill #0

## 2020-10-05 MED FILL — Rosuvastatin Calcium Tab 10 MG: ORAL | 30 days supply | Qty: 30 | Fill #2 | Status: AC

## 2020-10-05 NOTE — Telephone Encounter (Signed)
Requested medication (s) are due for refill today: expired medication  Requested medication (s) are on the active medication list: yes  Last refill:  08/26/19-08/25/20 #90 3 refills  Future visit scheduled: no  Notes to clinic:  expired medication      Requested Prescriptions  Pending Prescriptions Disp Refills   spironolactone (ALDACTONE) 25 MG tablet 90 tablet 3    Sig: TAKE 1 TABLET BY MOUTH DAILY.     Cardiovascular: Diuretics - Aldosterone Antagonist Passed - 10/05/2020 11:43 AM      Passed - Cr in normal range and within 360 days    Creat  Date Value Ref Range Status  07/22/2011 0.72 0.50 - 1.10 mg/dL Final   Creatinine, Ser  Date Value Ref Range Status  06/07/2020 0.91 0.57 - 1.00 mg/dL Final          Passed - K in normal range and within 360 days    Potassium  Date Value Ref Range Status  06/07/2020 4.4 3.5 - 5.2 mmol/L Final          Passed - Na in normal range and within 360 days    Sodium  Date Value Ref Range Status  06/07/2020 141 134 - 144 mmol/L Final          Passed - Last BP in normal range    BP Readings from Last 1 Encounters:  06/07/20 113/76          Passed - Valid encounter within last 6 months    Recent Outpatient Visits           4 months ago Type 2 diabetes mellitus with other specified complication, with long-term current use of insulin (HCC)   [place] [facility] [place], Shea Stakes, NP   7 months ago Encounter for Papanicolaou smear for cervical cancer screening   [facility] [place], Shea Stakes, NP   8 months ago Encounter to establish care   [facility] [place], [place] W, NP   1 year ago Uncontrolled type 2 diabetes mellitus with hyperglycemia [facility])   [facility] [place], [place], [place]   1 year ago Controlled type 2 diabetes mellitus without complication, with long-term current use of insulin (HCC)   [place] [place], [place], MD               isosorbide mononitrate (IMDUR) 30 MG 24 hr tablet 90 tablet 3    Sig: TAKE 1 TABLET BY MOUTH DAILY.     Cardiovascular:  Nitrates Failed - 10/05/2020 11:43 AM      Failed - Last Heart Rate in normal range    Pulse Readings from Last 1 Encounters:  06/07/20 (!) 115          Passed - Last BP in normal range    BP Readings from Last 1 Encounters:  06/07/20 113/76          Passed - Valid encounter within last 12 months    Recent Outpatient Visits           4 months ago Type 2 diabetes mellitus with other specified complication, with long-term current use of insulin [facility])   [place] [facility] [place], Shea Stakes, NP   7 months ago Encounter for Papanicolaou smear for cervical cancer screening   [facility] [place], Shea Stakes, NP   8 months ago Encounter  to establish care   [place] [facility] [place], [place] W, NP   1 year ago Uncontrolled type 2 diabetes mellitus with hyperglycemia [facility])   [facility] [place], [place], [place]   1 year ago Controlled type 2 diabetes mellitus without complication, with long-term current use of insulin [facility])   [place] [facility] [place], MD

## 2020-10-06 ENCOUNTER — Other Ambulatory Visit (HOSPITAL_COMMUNITY): Payer: Self-pay

## 2020-10-11 ENCOUNTER — Other Ambulatory Visit (HOSPITAL_COMMUNITY): Payer: Self-pay

## 2020-11-01 ENCOUNTER — Other Ambulatory Visit: Payer: Self-pay

## 2020-11-06 ENCOUNTER — Other Ambulatory Visit (HOSPITAL_COMMUNITY): Payer: Self-pay | Admitting: *Deleted

## 2020-11-06 DIAGNOSIS — I10 Essential (primary) hypertension: Secondary | ICD-10-CM

## 2020-11-06 MED ORDER — ENTRESTO 97-103 MG PO TABS: 1.0000 | ORAL_TABLET | Freq: Two times a day (BID) | ORAL | 1 refills | Status: DC

## 2020-11-08 ENCOUNTER — Other Ambulatory Visit: Payer: Self-pay

## 2020-11-14 ENCOUNTER — Other Ambulatory Visit: Payer: Self-pay

## 2020-11-20 ENCOUNTER — Other Ambulatory Visit: Payer: Self-pay

## 2020-11-29 ENCOUNTER — Other Ambulatory Visit (HOSPITAL_COMMUNITY): Payer: Self-pay

## 2020-11-29 MED FILL — Rosuvastatin Calcium Tab 10 MG: ORAL | 30 days supply | Qty: 30 | Fill #3 | Status: CN

## 2020-12-07 ENCOUNTER — Other Ambulatory Visit (HOSPITAL_COMMUNITY): Payer: Self-pay

## 2020-12-12 ENCOUNTER — Other Ambulatory Visit (HOSPITAL_COMMUNITY): Payer: Self-pay

## 2020-12-12 MED FILL — Rosuvastatin Calcium Tab 10 MG: ORAL | 30 days supply | Qty: 30 | Fill #3 | Status: AC

## 2020-12-29 ENCOUNTER — Other Ambulatory Visit (HOSPITAL_COMMUNITY): Payer: Self-pay

## 2020-12-29 MED ORDER — TOBRAMYCIN 0.3 % OP SOLN
OPHTHALMIC | 5 refills | Status: DC
  Filled 2020-12-29 – 2021-01-17 (×2): qty 5, 25d supply, fill #0

## 2021-01-02 ENCOUNTER — Ambulatory Visit: Payer: Medicaid Other | Admitting: Podiatry

## 2021-01-08 ENCOUNTER — Other Ambulatory Visit (HOSPITAL_COMMUNITY): Payer: Self-pay

## 2021-01-17 ENCOUNTER — Other Ambulatory Visit (HOSPITAL_COMMUNITY): Payer: Self-pay

## 2021-01-25 ENCOUNTER — Other Ambulatory Visit (HOSPITAL_COMMUNITY): Payer: Self-pay

## 2021-01-26 ENCOUNTER — Ambulatory Visit: Payer: Medicaid Other | Admitting: Podiatry

## 2021-01-26 ENCOUNTER — Encounter: Payer: Self-pay | Admitting: Podiatry

## 2021-01-26 ENCOUNTER — Other Ambulatory Visit: Payer: Self-pay

## 2021-01-26 DIAGNOSIS — M79675 Pain in left toe(s): Secondary | ICD-10-CM | POA: Diagnosis not present

## 2021-01-26 DIAGNOSIS — N179 Acute kidney failure, unspecified: Secondary | ICD-10-CM

## 2021-01-26 DIAGNOSIS — B351 Tinea unguium: Secondary | ICD-10-CM | POA: Diagnosis not present

## 2021-01-26 DIAGNOSIS — E119 Type 2 diabetes mellitus without complications: Secondary | ICD-10-CM

## 2021-01-26 DIAGNOSIS — M79674 Pain in right toe(s): Secondary | ICD-10-CM

## 2021-01-26 NOTE — Progress Notes (Signed)
This patient presents to the office with chief complaint of long thick deformed big toenails both feet.  She says she had surgery performed years ago and both great toenails were removed and she asked for the nails to regrow.  The nails have returned and are deformed  The nails become painful when the nails are thick.  She says she is diabetic.  She presents for evaluation and treatment.    General Appearance  Alert, conversant and in no acute stress.  Vascular  Dorsalis pedis and posterior tibial  pulses are palpable  bilaterally.  Capillary return is within normal limits  bilaterally. Temperature is within normal limits  bilaterally.  Neurologic  Senn-Weinstein monofilament wire test within normal limits  bilaterally. Muscle power within normal limits bilaterally.  Nails Thick disfigured discolored nails with subungual debris   hallux  bilaterally and second toenail left foot.. No evidence of bacterial infection or drainage bilaterally.  Orthopedic  No limitations of motion  feet .  No crepitus or effusions noted.  No bony pathology or digital deformities noted.  Skin  normotropic skin with no porokeratosis noted bilaterally.  No signs of infections or ulcers noted.   Onychomycosis  B/L  Debrided nails with nail nipper followed by dremel tool usage.  Discussed future nail removal with this patient.   RTC 3 months.   Emarion Toral DPM  

## 2021-02-06 ENCOUNTER — Other Ambulatory Visit: Payer: Self-pay

## 2021-02-20 ENCOUNTER — Other Ambulatory Visit (HOSPITAL_COMMUNITY): Payer: Self-pay

## 2021-02-20 ENCOUNTER — Other Ambulatory Visit: Payer: Self-pay | Admitting: Physician Assistant

## 2021-02-20 DIAGNOSIS — E1165 Type 2 diabetes mellitus with hyperglycemia: Secondary | ICD-10-CM

## 2021-02-20 DIAGNOSIS — I5022 Chronic systolic (congestive) heart failure: Secondary | ICD-10-CM

## 2021-02-20 MED FILL — Rosuvastatin Calcium Tab 10 MG: ORAL | 30 days supply | Qty: 30 | Fill #4 | Status: AC

## 2021-02-20 NOTE — Telephone Encounter (Signed)
Requested medication (s) are due for refill today:   Yes for both  Requested medication (s) are on the active medication list:   Yes for both  Future visit scheduled:   Yes 04/09/2021 with Geryl Rankins   Last ordered: Coreg 08/26/2019 #180, 1 refill;  hydralazine #90, 6 refills  Returned because protocol failed due to labs being due.   Provider to review for refills prior to her appt in March.   Requested Prescriptions  Pending Prescriptions Disp Refills   carvedilol (COREG) 6.25 MG tablet 180 tablet 1    Sig: TAKE 1 TABLET BY MOUTH 2 TIMES DAILY WITH A MEAL.     Cardiovascular: Beta Blockers 3 Failed - 02/20/2021 12:15 PM      Failed - AST in normal range and within 360 days    AST  Date Value Ref Range Status  02/15/2020 18 0 - 40 IU/L Final          Failed - ALT in normal range and within 360 days    ALT  Date Value Ref Range Status  02/15/2020 32 0 - 32 IU/L Final          Failed - Last Heart Rate in normal range    Pulse Readings from Last 1 Encounters:  06/07/20 (!) 115          Failed - Valid encounter within last 6 months    Recent Outpatient Visits           8 months ago Type 2 diabetes mellitus with other specified complication, with long-term current use of insulin ([place])   [place], Vernia Buff, NP   11 months ago Encounter for Papanicolaou smear for cervical cancer screening   [place], Vernia Buff, NP   1 year ago Encounter to establish care   [place], [place] W, NP   1 year ago Uncontrolled type 2 diabetes mellitus with hyperglycemia [facility])   [place], [place], [place]   1 year ago Controlled type 2 diabetes mellitus without complication, with long-term current use of insulin ([place])   [place], [place], MD       Future Appointments             In 1 month Gildardo Pounds, NP [place] - Cr in normal range and within 360 days    Creat  Date Value Ref Range Status  07/22/2011 0.72 0.50 - 1.10 mg/dL Final   Creatinine, Ser  Date Value Ref Range Status  06/07/2020 0.91 0.57 - 1.00 mg/dL Final          Passed - Last BP in normal range    BP Readings from Last 1 Encounters:  06/07/20 113/76           hydrALAZINE (APRESOLINE) 50 MG tablet 90 tablet 6    Sig: TAKE 1 TABLET BY MOUTH 3 TIMES DAILY.     Cardiovascular:  Vasodilators Failed - 02/20/2021 12:15 PM      Failed - HCT in normal range and within 360 days    Hematocrit  Date Value Ref Range Status  02/15/2020 43.1 34.0 - 46.6 % Final          Failed - HGB in normal range and within 360 days  Hemoglobin  Date Value Ref Range Status  02/15/2020 14.1 11.1 - 15.9 g/dL Final          Failed - RBC in normal range and within 360 days    RBC  Date Value Ref Range Status  02/15/2020 4.76 3.77 - 5.28 x10E6/uL Final  01/14/2018 4.48 3.87 - 5.11 MIL/uL Final          Failed - WBC in normal range and within 360 days    WBC  Date Value Ref Range Status  02/15/2020 10.8 3.4 - 10.8 x10E3/uL Final  01/14/2018 4.7 4.0 - 10.5 K/uL Final          Failed - PLT in normal range and within 360 days    Platelets  Date Value Ref Range Status  02/15/2020 377 150 - 450 x10E3/uL Final          Failed - ANA Screen, Ifa, Serum in normal range and within 360 days    No results found for: ANA, ANATITER, LABANTI        Passed - Last BP in normal range    BP Readings from Last 1 Encounters:  06/07/20 113/76          Passed - Valid encounter within last 12 months    Recent Outpatient Visits           8 months ago Type 2 diabetes mellitus with other specified complication, with long-term current use of insulin ([place])   [place], Vernia Buff, NP   11 months ago Encounter for Papanicolaou smear for  cervical cancer screening   [place], Vernia Buff, NP   1 year ago Encounter to establish care   [place], [place] W, NP   1 year ago Uncontrolled type 2 diabetes mellitus with hyperglycemia [facility])   [place], [place], [place]   1 year ago Controlled type 2 diabetes mellitus without complication, with long-term current use of insulin ([place])   [place] Antony Blackbird, MD       Future Appointments             In 1 month Gildardo Pounds, NP [place]

## 2021-02-21 ENCOUNTER — Other Ambulatory Visit (HOSPITAL_COMMUNITY): Payer: Self-pay

## 2021-02-23 ENCOUNTER — Other Ambulatory Visit (HOSPITAL_COMMUNITY): Payer: Self-pay

## 2021-02-23 ENCOUNTER — Other Ambulatory Visit: Payer: Self-pay | Admitting: Physician Assistant

## 2021-02-23 DIAGNOSIS — E1165 Type 2 diabetes mellitus with hyperglycemia: Secondary | ICD-10-CM

## 2021-02-23 DIAGNOSIS — I5022 Chronic systolic (congestive) heart failure: Secondary | ICD-10-CM

## 2021-02-23 MED ORDER — CARVEDILOL 6.25 MG PO TABS
6.2500 mg | ORAL_TABLET | Freq: Two times a day (BID) | ORAL | 0 refills | Status: DC
  Filled 2021-02-23: qty 60, 30d supply, fill #0

## 2021-02-23 MED ORDER — HYDRALAZINE HCL 50 MG PO TABS
50.0000 mg | ORAL_TABLET | Freq: Three times a day (TID) | ORAL | 0 refills | Status: DC
  Filled 2021-02-23: qty 90, 30d supply, fill #0

## 2021-03-05 ENCOUNTER — Other Ambulatory Visit (HOSPITAL_COMMUNITY): Payer: Self-pay

## 2021-03-14 ENCOUNTER — Other Ambulatory Visit: Payer: Self-pay

## 2021-03-15 ENCOUNTER — Other Ambulatory Visit: Payer: Self-pay

## 2021-03-20 ENCOUNTER — Other Ambulatory Visit: Payer: Self-pay

## 2021-03-30 ENCOUNTER — Other Ambulatory Visit: Payer: Self-pay | Admitting: Nurse Practitioner

## 2021-03-30 ENCOUNTER — Other Ambulatory Visit: Payer: Self-pay

## 2021-03-30 ENCOUNTER — Other Ambulatory Visit (HOSPITAL_COMMUNITY): Payer: Self-pay

## 2021-03-30 MED FILL — Rosuvastatin Calcium Tab 10 MG: ORAL | 30 days supply | Qty: 30 | Fill #5 | Status: AC

## 2021-03-30 NOTE — Telephone Encounter (Signed)
Requested medication (s) are due for refill today:   Yes ? ?Requested medication (s) are on the active medication list:   Yes ? ?Future visit scheduled:   Yes 04/09/2021 with Zelda ? ? ?Last ordered: 10/05/2020 #90, 0 refills ? ?Returned because protocol criteria not met.   Heart rate elevated.  No refills remaining.  ? ?Requested Prescriptions  ?Pending Prescriptions Disp Refills  ? isosorbide mononitrate (IMDUR) 30 MG 24 hr tablet 90 tablet 0  ?  Sig: Take 1 tablet (30 mg total) by mouth daily.  ?  ? Cardiovascular:  Nitrates Failed - 03/30/2021 12:11 PM  ?  ?  Failed - Last Heart Rate in normal range  ?  Pulse Readings from Last 1 Encounters:  ?06/07/20 (!) 115  ?  ?  ?  ?  Passed - Last BP in normal range  ?  BP Readings from Last 1 Encounters:  ?06/07/20 113/76  ?  ?  ?  ?  Passed - Valid encounter within last 12 months  ?  Recent Outpatient Visits   ? ?      ? 9 months ago Type 2 diabetes mellitus with other specified complication, with long-term current use of insulin ([place])  ? [place], [place] W, NP  ? 1 year ago Encounter for Papanicolaou smear for cervical cancer screening  ? [place], Vernia Buff, NP  ? 1 year ago Encounter to establish care  ? [place], [place] W, NP  ? 1 year ago Uncontrolled type 2 diabetes mellitus with hyperglycemia ([place])  ? [place], [place], [place]  ? 2 years ago Controlled type 2 diabetes mellitus without complication, with long-term current use of insulin ([place])  ? [place] Antony Blackbird, MD  ? ?  ?  ?Future Appointments   ? ?        ? In 1 week Gildardo Pounds, NP [place]  ? ?  ? ?  ?  ?  ? ?

## 2021-04-03 ENCOUNTER — Other Ambulatory Visit (HOSPITAL_COMMUNITY): Payer: Self-pay

## 2021-04-04 ENCOUNTER — Other Ambulatory Visit (HOSPITAL_COMMUNITY): Payer: Self-pay

## 2021-04-04 MED ORDER — ISOSORBIDE MONONITRATE ER 30 MG PO TB24
30.0000 mg | ORAL_TABLET | Freq: Every day | ORAL | 0 refills | Status: DC
  Filled 2021-04-04: qty 30, 30d supply, fill #0

## 2021-04-09 ENCOUNTER — Encounter: Payer: Self-pay | Admitting: Nurse Practitioner

## 2021-04-09 ENCOUNTER — Telehealth: Payer: Self-pay | Admitting: Pharmacist

## 2021-04-09 ENCOUNTER — Other Ambulatory Visit (HOSPITAL_COMMUNITY): Payer: Self-pay

## 2021-04-09 ENCOUNTER — Other Ambulatory Visit: Payer: Self-pay

## 2021-04-09 ENCOUNTER — Ambulatory Visit: Payer: Medicaid Other | Attending: Nurse Practitioner | Admitting: Nurse Practitioner

## 2021-04-09 VITALS — BP 144/102 | HR 89 | Resp 18 | Ht <= 58 in | Wt 203.2 lb

## 2021-04-09 DIAGNOSIS — I5022 Chronic systolic (congestive) heart failure: Secondary | ICD-10-CM

## 2021-04-09 DIAGNOSIS — Z0000 Encounter for general adult medical examination without abnormal findings: Secondary | ICD-10-CM

## 2021-04-09 DIAGNOSIS — I10 Essential (primary) hypertension: Secondary | ICD-10-CM | POA: Diagnosis not present

## 2021-04-09 DIAGNOSIS — E1165 Type 2 diabetes mellitus with hyperglycemia: Secondary | ICD-10-CM | POA: Diagnosis not present

## 2021-04-09 DIAGNOSIS — M2550 Pain in unspecified joint: Secondary | ICD-10-CM

## 2021-04-09 DIAGNOSIS — R7989 Other specified abnormal findings of blood chemistry: Secondary | ICD-10-CM

## 2021-04-09 DIAGNOSIS — Z1211 Encounter for screening for malignant neoplasm of colon: Secondary | ICD-10-CM

## 2021-04-09 DIAGNOSIS — E785 Hyperlipidemia, unspecified: Secondary | ICD-10-CM

## 2021-04-09 DIAGNOSIS — J449 Chronic obstructive pulmonary disease, unspecified: Secondary | ICD-10-CM

## 2021-04-09 DIAGNOSIS — Z1159 Encounter for screening for other viral diseases: Secondary | ICD-10-CM

## 2021-04-09 DIAGNOSIS — Z794 Long term (current) use of insulin: Secondary | ICD-10-CM

## 2021-04-09 MED ORDER — FUROSEMIDE 20 MG PO TABS
20.0000 mg | ORAL_TABLET | Freq: Every day | ORAL | 0 refills | Status: DC
  Filled 2021-04-09: qty 90, 90d supply, fill #0

## 2021-04-09 MED ORDER — CARVEDILOL 6.25 MG PO TABS
6.2500 mg | ORAL_TABLET | Freq: Two times a day (BID) | ORAL | 0 refills | Status: DC
  Filled 2021-04-09: qty 60, 30d supply, fill #0

## 2021-04-09 MED ORDER — VENTOLIN HFA 108 (90 BASE) MCG/ACT IN AERS
2.0000 | INHALATION_SPRAY | Freq: Four times a day (QID) | RESPIRATORY_TRACT | 2 refills | Status: DC | PRN
  Filled 2021-04-09: qty 18, 25d supply, fill #0

## 2021-04-09 MED ORDER — ALBUTEROL SULFATE (2.5 MG/3ML) 0.083% IN NEBU
2.5000 mg | INHALATION_SOLUTION | Freq: Four times a day (QID) | RESPIRATORY_TRACT | 1 refills | Status: DC | PRN
  Filled 2021-04-09: qty 75, 7d supply, fill #0

## 2021-04-09 MED ORDER — DICLOFENAC SODIUM 1 % EX GEL
4.0000 g | Freq: Four times a day (QID) | CUTANEOUS | 1 refills | Status: AC
  Filled 2021-04-09: qty 200, 25d supply, fill #0
  Filled 2022-01-31 – 2022-03-14 (×3): qty 200, 25d supply, fill #1

## 2021-04-09 MED ORDER — IPRATROPIUM-ALBUTEROL 0.5-2.5 (3) MG/3ML IN SOLN
3.0000 mL | Freq: Four times a day (QID) | RESPIRATORY_TRACT | 1 refills | Status: DC | PRN
  Filled 2021-04-09: qty 360, 30d supply, fill #0

## 2021-04-09 MED ORDER — ENTRESTO 97-103 MG PO TABS
1.0000 | ORAL_TABLET | Freq: Two times a day (BID) | ORAL | 0 refills | Status: AC
  Filled 2021-04-09: qty 60, 30d supply, fill #0

## 2021-04-09 MED ORDER — ALBUTEROL SULFATE HFA 108 (90 BASE) MCG/ACT IN AERS: 2.0000 | INHALATION_SPRAY | Freq: Four times a day (QID) | RESPIRATORY_TRACT | 2 refills | Status: DC | PRN

## 2021-04-09 MED ORDER — ISOSORBIDE MONONITRATE ER 30 MG PO TB24
30.0000 mg | ORAL_TABLET | Freq: Every day | ORAL | 0 refills | Status: DC
  Filled 2021-04-09: qty 30, 30d supply, fill #0

## 2021-04-09 MED ORDER — HYDRALAZINE HCL 50 MG PO TABS
50.0000 mg | ORAL_TABLET | Freq: Three times a day (TID) | ORAL | 0 refills | Status: DC
  Filled 2021-04-09: qty 90, 30d supply, fill #0

## 2021-04-09 MED ORDER — ROSUVASTATIN CALCIUM 10 MG PO TABS
ORAL_TABLET | Freq: Every day | ORAL | 6 refills | Status: DC
Start: 2021-04-09 — End: 2022-05-03
  Filled 2021-04-09: qty 30, fill #0
  Filled 2021-07-19 – 2021-08-01 (×2): qty 30, 30d supply, fill #0
  Filled 2021-09-10: qty 30, 30d supply, fill #1
  Filled 2021-11-06: qty 30, 30d supply, fill #2
  Filled 2022-01-31 – 2022-02-07 (×2): qty 30, 30d supply, fill #3

## 2021-04-09 NOTE — Telephone Encounter (Signed)
Duoneb is on back order. Are we able to write for the albuterol and ipratropium individually?  ?

## 2021-04-09 NOTE — Progress Notes (Signed)
? ?Assessment & Plan:  ?Felicia [name] was seen today for annual exam. ? ?Diagnoses and all orders for this visit: ? ?Encounter for annual physical exam ? ?Essential hypertension ?-     isosorbide mononitrate (IMDUR) 30 MG 24 hr tablet; Take 1 tablet (30 mg total) by mouth daily. ?-     sacubitril-valsartan (ENTRESTO) 97-103 MG; Take 1 tablet by mouth 2 (two) times daily. NEEDS TO SEE CARDIOLOGY ?-     CMP14+EGFR ? ?Dyslipidemia, goal LDL below 70 ?-     rosuvastatin (CRESTOR) 10 MG tablet; TAKE 1 TABLET (10 MG TOTAL) BY MOUTH DAILY. ?-     Lipid panel ? ?Type 2 diabetes mellitus with hyperglycemia, with long-term current use of insulin (HCC) ?-     hydrALAZINE (APRESOLINE) 50 MG tablet; Take 1 tablet (50 mg total) by mouth 3 (three) times daily. ?-     CMP14+EGFR ?-     Hemoglobin A1c ?-     Microalbumin / creatinine urine ratio ? ?Arthralgia of multiple joints ?-     diclofenac Sodium (VOLTAREN) 1 % GEL; Apply 4 g topically 4 (four) times daily. ? ?Chronic systolic heart failure (HCC) ?-     isosorbide mononitrate (IMDUR) 30 MG 24 hr tablet; Take 1 tablet (30 mg total) by mouth daily. ?-     carvedilol (COREG) 6.25 MG tablet; Take 1 tablet (6.25 mg total) by mouth 2 (two) times daily with a meal. ?-     Ambulatory referral to Cardiology ?-     sacubitril-valsartan (ENTRESTO) 97-103 MG; Take 1 tablet by mouth 2 (two) times daily. NEEDS TO SEE CARDIOLOGY ?-     furosemide (LASIX) 20 MG tablet; Take 1 tablet (20 mg total) by mouth daily. ? ?Colon cancer screening ?-     Fecal occult blood, imunochemical([facility]) ? ?Chronic obstructive pulmonary disease, unspecified COPD type ([place]) ?-     Discontinue: albuterol (VENTOLIN HFA) 108 (90 Base) MCG/ACT inhaler; Inhale 2 puffs into the lungs every 6 (six) hours as needed for wheezing or shortness of breath. ?-     Discontinue: ipratropium-albuterol (DUONEB) 0.5-2.5 (3) MG/3ML SOLN; Take 3 mLs by nebulization every 6 (six) hours as needed. ?-     VENTOLIN HFA 108 (90  Base) MCG/ACT inhaler; Inhale 2 puffs into the lungs every 6 (six) hours as needed for wheezing or shortness of breath. ?-     albuterol (PROVENTIL) (2.5 MG/3ML) 0.083% nebulizer solution; Take 3 mLs (2.5 mg total) by nebulization every 6 (six) hours as needed for wheezing or shortness of breath. ? ?Abnormal CBC ?-     CBC with Differential ? ?Need for hepatitis C screening test ?-     HCV Ab w Reflex to Quant PCR ? ? ? ?Patient has been counseled on age-appropriate routine health concerns for screening and prevention. These are reviewed and up-to-date. Referrals have been placed accordingly. Immunizations are up-to-date or declined.    ?Subjective:  ? ?Chief Complaint  ?Patient presents with  ? Annual Exam  ? ?HPI ?Felicia [name] 61 y.o. female presents to office today for annual physical ?She has a past medical history of Cardiomyopathy, CHF (noncompliant with cardiology follow up) COPD, DDD lumbar, Diabetes mellitus, Hypertension, Morbid Obesity, Non compliance w medication regimen ? ?She is overdue for visit with Cardiology. Currently out of Entresto ? ? ?HTN ?Blood pressure is elevated. States she is adherent with taking carvedilol 6.25 mg BID, furosemide 20 mg daily  (although this has not been prescribed since 2021), imdur  30 mg daily,  and spironolactone 25 mg daily.  ?BP Readings from Last 3 Encounters:  ?04/09/21 (!) 144/102  ?06/07/20 113/76  ?04/07/20 (!) 159/82  ?  ?DM 2 ?Poorly controlled. She has been prescribed Humulin 70/30 forty two units BID. LDL near goal with crestor 10 mg daily. If U8H >7 will add Trulicity or Ozempic.  ?Lab Results  ?Component Value Date  ? HGBA1C 10.9 (A) 06/07/2020  ?  ?Lab Results  ?Component Value Date  ? [place] 73 02/15/2020  ?  ? ?Joint Pain ?Notes bilateral shoulder pain unrelated to any injury or trauma. States she was told she had arthritis.  ? ? ? Review of Systems  ?Constitutional:  Negative for fever, malaise/fatigue and weight loss.  ?HENT: Negative.   Negative for nosebleeds.   ?Eyes: Negative.  Negative for blurred vision, double vision and photophobia.  ?Respiratory: Negative.  Negative for cough and shortness of breath.   ?Cardiovascular: Negative.  Negative for chest pain, palpitations and leg swelling.  ?Gastrointestinal: Negative.  Negative for heartburn, nausea and vomiting.  ?Genitourinary: Negative.   ?Musculoskeletal:  Positive for joint pain. Negative for myalgias.  ?Skin: Negative.   ?Neurological: Negative.  Negative for dizziness, focal weakness, seizures and headaches.  ?Endo/Heme/Allergies: Negative.   ?Psychiatric/Behavioral: Negative.  Negative for suicidal ideas.   ? ?Past Medical History:  ?Diagnosis Date  ? Cardiomyopathy   ? CHF (congestive heart failure) ([place])   ? minamal  ? COPD (chronic obstructive pulmonary disease) ([place])   ? DDD (degenerative disc disease), lumbar   ? Diabetes mellitus   ? Hypertension   ? Non compliance w medication regimen   ? Normal echocardiogram   ? LVEF 25-30% 03/27/2010  ? ? ?Past Surgical History:  ?Procedure Laterality Date  ? CARDIAC CATHETERIZATION    ? RIGHT/LEFT HEART CATH AND CORONARY ANGIOGRAPHY N/A 05/29/2017  ? Procedure: RIGHT/LEFT HEART CATH AND CORONARY ANGIOGRAPHY;  Surgeon: Jolaine Artist, MD;  Location: [facility];  Service: Cardiovascular;  Laterality: N/A;  ? TUBAL LIGATION    ? ? ?Family History  ?Problem Relation Age of Onset  ? Stroke Mother   ? Lung cancer Father   ? Diabetes Daughter   ? ? ?Social History Reviewed with no changes to be made today.  ? ?Outpatient Medications Prior to Visit  ?Medication Sig Dispense Refill  ? acetaminophen (TYLENOL) 500 MG tablet Take 1,500 mg by mouth daily as needed for mild pain or headache.    ? aspirin 81 MG chewable tablet Chew 81 mg by mouth daily.    ? Blood Glucose Monitoring Suppl (TRUE METRIX METER) w/Device KIT Use to check blood sugars up to 3 times per day 1 kit 0  ? glucose blood (TRUE METRIX BLOOD GLUCOSE TEST) test strip Use to check  blood sugars up to 3 times per day 100 each 12  ? insulin NPH-regular Human (HUMULIN 70/30) (70-30) 100 UNIT/ML injection Inject 42 Units into the skin 2 (two) times daily with a meal. 80 mL 1  ? Insulin Syringes, Disposable, U-100 0.5 ML MISC Inject into the skin twice per day. E11.65 200 each 6  ? latanoprost (XALATAN) 0.005 % ophthalmic solution Instill 1 drop into both eyes at bedtime 7.5 mL 4  ? Multiple Vitamin (MULTIVITAMIN WITH MINERALS) TABS tablet Take 1 tablet by mouth once a week.     ? spironolactone (ALDACTONE) 25 MG tablet Take 1 tablet (25 mg total) by mouth daily. 90 tablet 3  ? tobramycin (TOBREX)  0.3 % ophthalmic solution Instill 1 drop in left eye four times a day; Begin 1 day prior to procedure, use the day of procedure and 1 day after then stop 5 mL 5  ? TRUEplus Lancets 28G MISC Use to check blood sugars 3 times per day 100 each 11  ? albuterol (VENTOLIN HFA) 108 (90 Base) MCG/ACT inhaler Inhale 2 puffs into the lungs every 6 (six) hours as needed for wheezing or shortness of breath. 18 g 2  ? carvedilol (COREG) 6.25 MG tablet Take 1 tablet (6.25 mg total) by mouth 2 (two) times daily with a meal. 60 tablet 0  ? diphenhydrAMINE (BENADRYL) 25 MG tablet Take 25 mg by mouth every 6 (six) hours as needed for allergies.     ? diphenhydramine-acetaminophen (TYLENOL PM) 25-500 MG TABS tablet Take 2 tablets by mouth at bedtime as needed (sleep).     ? hydrALAZINE (APRESOLINE) 50 MG tablet Take 1 tablet (50 mg total) by mouth 3 (three) times daily. 90 tablet 0  ? ibuprofen (ADVIL) 800 MG tablet Take 1 tablet (800 mg total) by mouth 3 (three) times daily. 21 tablet 0  ? isosorbide mononitrate (IMDUR) 30 MG 24 hr tablet Take 1 tablet (30 mg total) by mouth daily. 30 tablet 0  ? rosuvastatin (CRESTOR) 10 MG tablet TAKE 1 TABLET (10 MG TOTAL) BY MOUTH DAILY. 30 tablet 6  ? sacubitril-valsartan (ENTRESTO) 97-103 MG Take 1 tablet by mouth 2 (two) times daily. 180 tablet 1  ? furosemide (LASIX) 20 MG tablet  TAKE 1 TABLET (20 MG TOTAL) BY MOUTH DAILY. 90 tablet 3  ? ipratropium-albuterol (DUONEB) 0.5-2.5 (3) MG/3ML SOLN Take 3 mLs by nebulization every 6 (six) hours as needed. 360 mL 1  ? ?No facility-administered

## 2021-04-09 NOTE — Patient Instructions (Signed)
Dr. Jones Broom ?Felicia [name]. Bensimhon, MD ?Address: [address] [place], [place], [place] [address] ?Phone: [phone] ? ? ? ?

## 2021-04-10 ENCOUNTER — Other Ambulatory Visit: Payer: Self-pay

## 2021-04-11 ENCOUNTER — Other Ambulatory Visit: Payer: Self-pay

## 2021-04-11 ENCOUNTER — Other Ambulatory Visit: Payer: Self-pay | Admitting: Nurse Practitioner

## 2021-04-11 DIAGNOSIS — R768 Other specified abnormal immunological findings in serum: Secondary | ICD-10-CM

## 2021-04-11 DIAGNOSIS — E1169 Type 2 diabetes mellitus with other specified complication: Secondary | ICD-10-CM

## 2021-04-11 DIAGNOSIS — R748 Abnormal levels of other serum enzymes: Secondary | ICD-10-CM

## 2021-04-11 LAB — CBC WITH DIFFERENTIAL/PLATELET
Basophils Absolute: 0 10*3/uL (ref 0.0–0.2)
Basos: 0 %
EOS (ABSOLUTE): 0.1 10*3/uL (ref 0.0–0.4)
Eos: 1 %
Hematocrit: 46.3 % (ref 34.0–46.6)
Hemoglobin: 15.4 g/dL (ref 11.1–15.9)
Immature Grans (Abs): 0.1 10*3/uL (ref 0.0–0.1)
Immature Granulocytes: 1 %
Lymphocytes Absolute: 3.8 10*3/uL — ABNORMAL HIGH (ref 0.7–3.1)
Lymphs: 41 %
MCH: 30 pg (ref 26.6–33.0)
MCHC: 33.3 g/dL (ref 31.5–35.7)
MCV: 90 fL (ref 79–97)
Monocytes Absolute: 0.5 10*3/uL (ref 0.1–0.9)
Monocytes: 5 %
Neutrophils Absolute: 4.9 10*3/uL (ref 1.4–7.0)
Neutrophils: 52 %
Platelets: 247 10*3/uL (ref 150–450)
RBC: 5.14 x10E6/uL (ref 3.77–5.28)
RDW: 13.7 % (ref 11.7–15.4)
WBC: 9.3 10*3/uL (ref 3.4–10.8)

## 2021-04-11 LAB — HCV RT-PCR, QUANT (NON-GRAPH): Hepatitis C Quantitation: NOT DETECTED IU/mL

## 2021-04-11 LAB — MICROALBUMIN / CREATININE URINE RATIO
Creatinine, Urine: 127.4 mg/dL
Microalb/Creat Ratio: 59 mg/g creat — ABNORMAL HIGH (ref 0–29)
Microalbumin, Urine: 75 ug/mL

## 2021-04-11 LAB — CMP14+EGFR
ALT: 29 IU/L (ref 0–32)
AST: 22 IU/L (ref 0–40)
Albumin/Globulin Ratio: 1.6 (ref 1.2–2.2)
Albumin: 4.4 g/dL (ref 3.8–4.9)
Alkaline Phosphatase: 184 IU/L — ABNORMAL HIGH (ref 44–121)
BUN/Creatinine Ratio: 16 (ref 9–23)
BUN: 12 mg/dL (ref 6–24)
Bilirubin Total: 0.2 mg/dL (ref 0.0–1.2)
CO2: 30 mmol/L — ABNORMAL HIGH (ref 20–29)
Calcium: 10 mg/dL (ref 8.7–10.2)
Chloride: 97 mmol/L (ref 96–106)
Creatinine, Ser: 0.75 mg/dL (ref 0.57–1.00)
Globulin, Total: 2.8 g/dL (ref 1.5–4.5)
Glucose: 190 mg/dL — ABNORMAL HIGH (ref 70–99)
Potassium: 4.3 mmol/L (ref 3.5–5.2)
Sodium: 143 mmol/L (ref 134–144)
Total Protein: 7.2 g/dL (ref 6.0–8.5)
eGFR: 92 mL/min/{1.73_m2} (ref >59)

## 2021-04-11 LAB — LIPID PANEL
Chol/HDL Ratio: 3 ratio (ref 0.0–4.4)
Cholesterol, Total: 124 mg/dL (ref 100–199)
HDL: 42 mg/dL (ref >39)
LDL Chol Calc (NIH): 61 mg/dL (ref 0–99)
Triglycerides: 114 mg/dL (ref 0–149)
VLDL Cholesterol Cal: 21 mg/dL (ref 5–40)

## 2021-04-11 LAB — HEMOGLOBIN A1C
Est. average glucose Bld gHb Est-mCnc: 258 mg/dL
Hgb A1c MFr Bld: 10.6 % — ABNORMAL HIGH (ref 4.8–5.6)

## 2021-04-11 LAB — HCV AB W REFLEX TO QUANT PCR: HCV Ab: REACTIVE — AB

## 2021-04-11 MED ORDER — HUMULIN 70/30 (70-30) 100 UNIT/ML ~~LOC~~ SUSP
42.0000 [IU] | Freq: Two times a day (BID) | SUBCUTANEOUS | 1 refills | Status: DC
  Filled 2021-04-11: qty 80, 95d supply, fill #0

## 2021-04-11 MED ORDER — "INSULIN SYRINGE-NEEDLE U-100 30G X 5/16"" 0.5 ML MISC"
6 refills | Status: AC
  Filled 2021-04-11: qty 100, 34d supply, fill #0

## 2021-04-11 MED ORDER — TRULICITY 0.75 MG/0.5ML ~~LOC~~ SOAJ
0.7500 mg | SUBCUTANEOUS | 1 refills | Status: AC
  Filled 2021-04-11 – 2021-05-02 (×2): qty 2, 28d supply, fill #0

## 2021-04-12 ENCOUNTER — Other Ambulatory Visit: Payer: Self-pay

## 2021-04-19 ENCOUNTER — Other Ambulatory Visit: Payer: Self-pay

## 2021-05-02 ENCOUNTER — Ambulatory Visit (INDEPENDENT_AMBULATORY_CARE_PROVIDER_SITE_OTHER): Payer: Medicaid Other | Admitting: Podiatry

## 2021-05-02 ENCOUNTER — Encounter: Payer: Self-pay | Admitting: Podiatry

## 2021-05-02 ENCOUNTER — Other Ambulatory Visit: Payer: Self-pay

## 2021-05-02 DIAGNOSIS — E119 Type 2 diabetes mellitus without complications: Secondary | ICD-10-CM

## 2021-05-02 DIAGNOSIS — M79674 Pain in right toe(s): Secondary | ICD-10-CM

## 2021-05-02 DIAGNOSIS — B351 Tinea unguium: Secondary | ICD-10-CM | POA: Diagnosis not present

## 2021-05-02 DIAGNOSIS — M79675 Pain in left toe(s): Secondary | ICD-10-CM

## 2021-05-02 DIAGNOSIS — N179 Acute kidney failure, unspecified: Secondary | ICD-10-CM

## 2021-05-02 NOTE — Progress Notes (Signed)
This patient presents to the office with chief complaint of long thick deformed big toenails both feet.  She says she had surgery performed years ago and both great toenails were removed and she asked for the nails to regrow.  The nails have returned and are deformed  The nails become painful when the nails are thick.  She says she is diabetic.  She presents for evaluation and treatment.    General Appearance  Alert, conversant and in no acute stress.  Vascular  Dorsalis pedis and posterior tibial  pulses are palpable  bilaterally.  Capillary return is within normal limits  bilaterally. Temperature is within normal limits  bilaterally.  Neurologic  Senn-Weinstein monofilament wire test within normal limits  bilaterally. Muscle power within normal limits bilaterally.  Nails Thick disfigured discolored nails with subungual debris   hallux  bilaterally and second toenail left foot.. No evidence of bacterial infection or drainage bilaterally.  Orthopedic  No limitations of motion  feet .  No crepitus or effusions noted.  No bony pathology or digital deformities noted.  Skin  normotropic skin with no porokeratosis noted bilaterally.  No signs of infections or ulcers noted.   Onychomycosis  B/L  Debrided nails with nail nipper followed by dremel tool usage.    RTC 3 months.   Doyle Kunath DPM  

## 2021-05-11 ENCOUNTER — Other Ambulatory Visit: Payer: Self-pay

## 2021-05-15 ENCOUNTER — Ambulatory Visit: Payer: Medicaid Other | Attending: Nurse Practitioner

## 2021-05-15 DIAGNOSIS — R768 Other specified abnormal immunological findings in serum: Secondary | ICD-10-CM | POA: Diagnosis not present

## 2021-05-15 DIAGNOSIS — R748 Abnormal levels of other serum enzymes: Secondary | ICD-10-CM

## 2021-05-18 LAB — HEPATIC FUNCTION PANEL
ALT: 17 IU/L (ref 0–32)
AST: 8 IU/L (ref 0–40)
Albumin: 4.2 g/dL (ref 3.8–4.9)
Alkaline Phosphatase: 135 IU/L — ABNORMAL HIGH (ref 44–121)
Bilirubin Total: 0.3 mg/dL (ref 0.0–1.2)
Bilirubin, Direct: 0.1 mg/dL (ref 0.00–0.40)
Total Protein: 6.8 g/dL (ref 6.0–8.5)

## 2021-05-18 LAB — HCV RT-PCR, QUANT (NON-GRAPH): Hepatitis C Quantitation: NOT DETECTED IU/mL

## 2021-05-18 LAB — HCV AB W REFLEX TO QUANT PCR: HCV Ab: REACTIVE — AB

## 2021-06-01 ENCOUNTER — Other Ambulatory Visit: Payer: Self-pay

## 2021-07-11 ENCOUNTER — Ambulatory Visit: Payer: Medicaid Other | Admitting: Nurse Practitioner

## 2021-07-19 ENCOUNTER — Other Ambulatory Visit: Payer: Self-pay | Admitting: Nurse Practitioner

## 2021-07-19 ENCOUNTER — Other Ambulatory Visit (HOSPITAL_COMMUNITY): Payer: Self-pay

## 2021-07-19 ENCOUNTER — Other Ambulatory Visit: Payer: Self-pay

## 2021-07-19 DIAGNOSIS — I10 Essential (primary) hypertension: Secondary | ICD-10-CM

## 2021-07-19 DIAGNOSIS — I5022 Chronic systolic (congestive) heart failure: Secondary | ICD-10-CM

## 2021-07-19 DIAGNOSIS — E1165 Type 2 diabetes mellitus with hyperglycemia: Secondary | ICD-10-CM

## 2021-07-19 MED ORDER — ISOSORBIDE MONONITRATE ER 30 MG PO TB24
30.0000 mg | ORAL_TABLET | Freq: Every day | ORAL | 1 refills | Status: DC
  Filled 2021-07-19 – 2021-08-01 (×2): qty 30, 30d supply, fill #0
  Filled 2021-09-10: qty 30, 30d supply, fill #1

## 2021-07-19 MED ORDER — HYDRALAZINE HCL 50 MG PO TABS
50.0000 mg | ORAL_TABLET | Freq: Three times a day (TID) | ORAL | 1 refills | Status: DC
  Filled 2021-07-19 – 2021-08-01 (×2): qty 90, 30d supply, fill #0
  Filled 2021-11-06: qty 90, 30d supply, fill #1

## 2021-07-19 MED ORDER — CARVEDILOL 6.25 MG PO TABS
6.2500 mg | ORAL_TABLET | Freq: Two times a day (BID) | ORAL | 1 refills | Status: DC
  Filled 2021-07-19 – 2021-08-01 (×2): qty 60, 30d supply, fill #0
  Filled 2021-11-06: qty 60, 30d supply, fill #1

## 2021-07-19 NOTE — Telephone Encounter (Signed)
Requested medication (s) are due for refill today: yes  Requested medication (s) are on the active medication list: yes  Last refill:  all last refilled 04/09/21  Future visit scheduled: yes  Notes to clinic:  pt no showed June appt   Requested Prescriptions  Pending Prescriptions Disp Refills   carvedilol (COREG) 6.25 MG tablet 60 tablet 0    Sig: Take 1 tablet (6.25 mg total) by mouth 2 (two) times daily with a meal.     Cardiovascular: Beta Blockers 3 Failed - 07/19/2021  3:37 PM      Failed - Last BP in normal range    BP Readings from Last 1 Encounters:  04/09/21 (!) 144/102         Passed - Cr in normal range and within 360 days    Creat  Date Value Ref Range Status  07/22/2011 0.72 0.50 - 1.10 mg/dL Final   Creatinine, Ser  Date Value Ref Range Status  04/09/2021 0.75 0.57 - 1.00 mg/dL Final         Passed - AST in normal range and within 360 days    AST  Date Value Ref Range Status  05/15/2021 8 0 - 40 IU/L Final         Passed - ALT in normal range and within 360 days    ALT  Date Value Ref Range Status  05/15/2021 17 0 - 32 IU/L Final         Passed - Last Heart Rate in normal range    Pulse Readings from Last 1 Encounters:  04/09/21 89         Passed - Valid encounter within last 6 months    Recent Outpatient Visits           3 months ago Encounter for annual physical exam   [place] [facility] [place], [place] W, NP   1 year ago Type 2 diabetes mellitus with other specified complication, with long-term current use of insulin (HCC)   [facility] [place], Shea Stakes, NP   1 year ago Encounter for Papanicolaou smear for cervical cancer screening   [facility] [place], Shea Stakes, NP   1 year ago Encounter to establish care   [facility] [place], [place] W, NP   1 year ago Uncontrolled type 2 diabetes mellitus with hyperglycemia [facility])   [facility] [place], [place], [place]       Future Appointments             In 2 months Storm Frisk, MD  [facility]             isosorbide mononitrate (IMDUR) 30 MG 24 hr tablet 30 tablet 0    Sig: Take 1 tablet (30 mg total) by mouth daily.     Cardiovascular:  Nitrates Failed - 07/19/2021  3:37 PM      Failed - Last BP in normal range    BP Readings from Last 1 Encounters:  04/09/21 (!) 144/102         Passed - Last Heart Rate in normal range    Pulse Readings from Last 1 Encounters:  04/09/21 89         Passed - Valid encounter within last 12 months    Recent Outpatient Visits           3 months ago  Encounter for annual physical exam   [facility] [place], [place] W, NP   1 year ago Type 2 diabetes mellitus with other specified complication, with long-term current use of insulin [facility])   [facility] [place], Shea Stakes, NP   1 year ago Encounter for Papanicolaou smear for cervical cancer screening   [facility] [place], Shea Stakes, NP   1 year ago Encounter to establish care   [facility] [place], [place] W, NP   1 year ago Uncontrolled type 2 diabetes mellitus with hyperglycemia [facility])   [facility] [place], Marzella Schlein, [place]       Future Appointments             In 2 months Storm Frisk, MD [facility]             hydrALAZINE (APRESOLINE) 50 MG tablet 90 tablet 0    Sig: Take 1 tablet (50 mg total) by mouth 3 (three) times daily.     Cardiovascular:  Vasodilators Failed - 07/19/2021  3:37 PM      Failed - ANA Screen, Ifa, Serum in normal range and within 360 days    No results found for: "ANA", "ANATITER", "LABANTI"       Failed - Last BP in normal range    BP Readings from Last 1 Encounters:  04/09/21 (!) 144/102          Passed - HCT in normal range and within 360 days    Hematocrit  Date Value Ref Range Status  04/09/2021 46.3 34.0 - 46.6 % Final         Passed - HGB in normal range and within 360 days    Hemoglobin  Date Value Ref Range Status  04/09/2021 15.4 11.1 - 15.9 g/dL Final         Passed - RBC in normal range and within 360 days    RBC  Date Value Ref Range Status  04/09/2021 5.14 3.77 - 5.28 x10E6/uL Final  01/14/2018 4.48 3.87 - 5.11 MIL/uL Final         Passed - WBC in normal range and within 360 days    WBC  Date Value Ref Range Status  04/09/2021 9.3 3.4 - 10.8 x10E3/uL Final  01/14/2018 4.7 4.0 - 10.5 K/uL Final         Passed - PLT in normal range and within 360 days    Platelets  Date Value Ref Range Status  04/09/2021 247 150 - 450 x10E3/uL Final         Passed - Valid encounter within last 12 months    Recent Outpatient Visits           3 months ago Encounter for annual physical exam   [facility] [place], [place] W, NP   1 year ago Type 2 diabetes mellitus with other specified complication, with long-term current use of insulin [facility])   [facility] [place], Shea Stakes, NP   1 year ago Encounter for Papanicolaou smear for cervical cancer screening   [facility] [place], Shea Stakes, NP   1 year ago Encounter to establish care   [facility] [place], [place] W, NP   1 year ago Uncontrolled type 2 diabetes mellitus with hyperglycemia (HCC)   [place]  [facility] [place], Marzella Schlein, [place]       Future Appointments             In 2 months Delford Field, Charlcie Cradle, MD [facility]

## 2021-07-20 ENCOUNTER — Other Ambulatory Visit: Payer: Self-pay

## 2021-07-25 ENCOUNTER — Other Ambulatory Visit: Payer: Self-pay

## 2021-07-26 ENCOUNTER — Other Ambulatory Visit: Payer: Self-pay

## 2021-08-01 ENCOUNTER — Ambulatory Visit: Payer: Medicaid Other | Admitting: Podiatry

## 2021-08-01 ENCOUNTER — Other Ambulatory Visit: Payer: Self-pay

## 2021-08-03 ENCOUNTER — Other Ambulatory Visit: Payer: Self-pay

## 2021-08-08 ENCOUNTER — Ambulatory Visit: Payer: Medicaid Other | Admitting: Cardiovascular Disease

## 2021-08-08 NOTE — Progress Notes (Addendum)
Cardiology Office Note:    Date:  08/09/2021   ID:  Felicia [name], DOB 08/01/1960, MRN [id number]  PCP:  Gildardo Pounds, NP   [facility] Providers Cardiologist:  Lenna Sciara, MD Referring MD: Gildardo Pounds, NP   Chief Complaint/Reason for Referral: Chronic systolic heart failure  ASSESSMENT:    1. Nonischemic cardiomyopathy ([place])   2. 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 Hypertension associated with diabetes ([place])   4. Hyperlipidemia associated with type 2 diabetes mellitus ([place])   5. Mild CAD   6. BMI 40.0-44.9, adult ([place])     PLAN:    In order of problems listed above: 1.  Nonischemic cardiomyopathy: The patient's last echocardiogram in 2021 demonstrated normalized LV function on medications.  We will obtain echocardiogram to evaluate further.  She is doing well and I suspect that her LV function is probably preserved.  Start Jardiance 10 mg daily.  Continue Entresto, BiDil, Coreg, and Lasix.  Follow-up in 1 year or earlier if needed. 2.  Type 2 diabetes: Continue aspirin, Crestor, continue Entresto, and Jardiance 10 mg daily. 3.  Hypertension: Continue Entresto for renal protection in a diabetic. 4.  Hyperlipidemia: This is being followed by the patient's primary care provider.  LDL in March was at goal of less than 70.  Check Lp(A) today.  ADDENDUM Lp(A) is elevated, will refer to pharmacy for recommendations. 5.  Mild coronary artery disease: Continue beta-blocker, aspirin, and statin. 6.  Elevated BMI: Refer to pharmacy for recommendations regarding pharmacotherapy.             Dispo:  No follow-ups on file.      Medication Adjustments/Labs and Tests Ordered: Current medicines are reviewed at length with the patient today.  Concerns regarding medicines are outlined above.  The following changes have been made:      Labs/tests ordered: Orders Placed This Encounter  Procedures   Lipoprotein A (LPA)   AMB Referral to [facility] Pharm-D   ECHOCARDIOGRAM COMPLETE    Medication Changes: Meds ordered this encounter  Medications   sacubitril-valsartan (ENTRESTO) 97-103 MG    Sig: Take 1 tablet by mouth 2 (two) times daily.    Dispense:  60 tablet    Refill:  11   empagliflozin (JARDIANCE) 10 MG TABS tablet    Sig: Take 1 tablet (10 mg total) by mouth daily before breakfast.    Dispense:  30 tablet    Refill:  11     Current medicines are reviewed at length with the patient today.  The patient does not have concerns regarding medicines.   History of Present Illness:    FOCUSED PROBLEM LIST:   1.  Previous nonischemic cardiomyopathy with ejection fraction of 25 to 30% in 2012; most recent echocardiogram 2021 demonstrates normalized LV function 2.  COPD with ongoing smoking 3.  Type 2 diabetes on insulin 4.  Hypertension 5.  Hyperlipidemia 6.  Mild obstructive coronary artery disease on cardiac catheterization 2019 7.  BMI of 43  The patient is a 61 y.o. female with the indicated medical history here for recommendations regarding chronic systolic heart failure.  The patient had been previously cared for by the heart failure division here.  She was seen last in September 2021.  At that time she was on Entresto, Aldactone, Imdur and hydralazine.  Her ejection fraction normalized.  The patient continues to do well.  She is compliant with her medical therapy.  She denies any significant shortness of breath or chest pain.  She has had no presyncope or syncope.  She has not required any emergency room visits or hospitalizations.  She denies any paroxysmal nocturnal dyspnea or severe edema.  She has not had a stroke.  She is is on disability due to her cardiac issues.  She previously worked in a Administrator, arts.        Current Medications: Current Meds  Medication Sig   acetaminophen (TYLENOL) 500 MG  tablet Take 1,500 mg by mouth daily as needed for mild pain or headache.   albuterol (PROVENTIL) (2.5 MG/3ML) 0.083% nebulizer solution Take 3 mLs (2.5 mg total) by nebulization every 6 (six) hours as needed for wheezing or shortness of breath.   aspirin 81 MG chewable tablet Chew 81 mg by mouth daily.   Blood Glucose Monitoring Suppl (TRUE METRIX METER) w/Device KIT Use to check blood sugars up to 3 times per day   carvedilol (COREG) 6.25 MG tablet Take 1 tablet (6.25 mg total) by mouth 2 (two) times daily with a meal.   empagliflozin (JARDIANCE) 10 MG TABS tablet Take 1 tablet (10 mg total) by mouth daily before breakfast.   glucose blood (TRUE METRIX BLOOD GLUCOSE TEST) test strip Use to check blood sugars up to 3 times per day   hydrALAZINE (APRESOLINE) 50 MG tablet Take 1 tablet (50 mg total) by mouth 3 (three) times daily.   Insulin Syringe-Needle U-100 30G X 5/16" 0.5 ML MISC use as directed, to inject into the skin twice a day   isosorbide mononitrate (IMDUR) 30 MG 24 hr tablet Take 1 tablet (30 mg total) by mouth daily.   latanoprost (XALATAN) 0.005 % ophthalmic solution Instill 1 drop into both eyes at bedtime   Multiple Vitamin (MULTIVITAMIN WITH MINERALS) TABS tablet Take 1 tablet by mouth once a week.    rosuvastatin (CRESTOR) 10 MG tablet TAKE 1 TABLET (10 MG TOTAL) BY MOUTH DAILY.   spironolactone (ALDACTONE) 25 MG tablet Take 1 tablet (25 mg total) by mouth daily.   tobramycin (TOBREX) 0.3 % ophthalmic solution Instill 1 drop in left eye four times a day; Begin 1 day prior to procedure, use the day of procedure and 1 day after then stop   TRUEplus Lancets 28G MISC Use to check blood sugars 3 times per day   VENTOLIN HFA 108 (90 Base) MCG/ACT inhaler Inhale 2 puffs into the lungs every 6 (six) hours as needed for wheezing or shortness of breath.   [DISCONTINUED] sacubitril-valsartan (ENTRESTO) 97-103 MG Take 1 tablet by mouth 2 (two) times daily.     Allergies:    Banana and Latex    Social History:   Social History   Tobacco Use   Smoking status: Every Day    Packs/day: 0.50    Types: Cigarettes   Smokeless tobacco: Never  Vaping Use   Vaping Use: Never used  Substance Use Topics   Alcohol use: No   Drug use: No     Family Hx: Family History  Problem Relation Age of Onset   Stroke Mother    Lung cancer Father    Diabetes Daughter      Review of Systems:   Please see the history of present illness.    All other systems reviewed and are negative.     EKGs/Labs/Other Test Reviewed:    EKG:  EKG performed 2019 that I personally reviewed demonstrates sinus rhythm; EKG  performed today that I personally reviewed demonstrates normal sinus rhythm nonspecific ST and T wave changes.  Prior CV studies:  ABI 2022 consistent with moderate bilateral peripheral vascular disease  TTE 2021 with ejection fraction of 65 to 70% with no significant valvular abnormalities  Cardiac catheterization 2019 with mild obstructive coronary artery disease, Fick cardiac output of 5.3 L/min and index of 2.9 L/min/m with a mean wedge pressure of 14 mmHg and mean RA pressure of 7 mmHg    Other studies Reviewed: Review of the additional studies/records demonstrates: Chest CT 2019 demonstrates coronary artery calcification.  Recent Labs: 04/09/2021: BUN 12; Creatinine, Ser 0.75; Hemoglobin 15.4; Platelets 247; Potassium 4.3; Sodium 143 05/15/2021: ALT 17   Recent Lipid Panel Lab Results  Component Value Date/Time   CHOL 124 04/09/2021 03:39 PM   TRIG 114 04/09/2021 03:39 PM   HDL 42 04/09/2021 03:39 PM   LDLCALC 61 04/09/2021 03:39 PM    Risk Assessment/Calculations:           Physical Exam:    VS:  BP 122/80   Pulse 94   Ht 4' 9.5" (1.461 m)   Wt 204 lb (92.5 kg)   LMP 08/02/2010   SpO2 97%   BMI 43.38 kg/m    Wt Readings from Last 3 Encounters:  08/09/21 204 lb (92.5 kg)  04/09/21 203 lb 4 oz (92.2 kg)  06/07/20 201 lb 3.2 oz (91.3 kg)    GENERAL:   No apparent distress, AOx3 HEENT:  No carotid bruits, +2 carotid impulses, no scleral icterus CAR: RRR no murmurs, gallops, rubs, or thrills RES:  Clear to auscultation bilaterally ABD:  Soft, nontender, nondistended, positive bowel sounds x 4, obese VASC:  +2 radial pulses, +2 carotid pulses, palpable pedal pulses NEURO:  CN 2-12 grossly intact; motor and sensory grossly intact PSYCH:  No active depression or anxiety EXT:  No edema, ecchymosis, or cyanosis  Signed, Early Osmond, MD  08/09/2021 9:49 AM    [place], [place], [place]  [address] Phone: [phone]; Fax: [phone]   Note:  This document was prepared using Dragon voice recognition software and may include unintentional dictation errors.

## 2021-08-09 ENCOUNTER — Other Ambulatory Visit: Payer: Self-pay

## 2021-08-09 ENCOUNTER — Encounter: Payer: Self-pay | Admitting: Internal Medicine

## 2021-08-09 ENCOUNTER — Ambulatory Visit: Payer: Medicaid Other | Admitting: Internal Medicine

## 2021-08-09 VITALS — BP 122/80 | HR 94 | Ht <= 58 in | Wt 204.0 lb

## 2021-08-09 DIAGNOSIS — E11311 Type 2 diabetes mellitus with unspecified diabetic retinopathy with macular edema: Secondary | ICD-10-CM

## 2021-08-09 DIAGNOSIS — E785 Hyperlipidemia, unspecified: Secondary | ICD-10-CM

## 2021-08-09 DIAGNOSIS — Z794 Long term (current) use of insulin: Secondary | ICD-10-CM | POA: Diagnosis not present

## 2021-08-09 DIAGNOSIS — I2510 Atherosclerotic heart disease of native coronary artery without angina pectoris: Secondary | ICD-10-CM | POA: Diagnosis not present

## 2021-08-09 DIAGNOSIS — Z6841 Body Mass Index (BMI) 40.0 and over, adult: Secondary | ICD-10-CM | POA: Diagnosis not present

## 2021-08-09 DIAGNOSIS — E1159 Type 2 diabetes mellitus with other circulatory complications: Secondary | ICD-10-CM

## 2021-08-09 DIAGNOSIS — I152 Hypertension secondary to endocrine disorders: Secondary | ICD-10-CM

## 2021-08-09 DIAGNOSIS — E1169 Type 2 diabetes mellitus with other specified complication: Secondary | ICD-10-CM | POA: Diagnosis not present

## 2021-08-09 DIAGNOSIS — I428 Other cardiomyopathies: Secondary | ICD-10-CM | POA: Diagnosis not present

## 2021-08-09 MED ORDER — EMPAGLIFLOZIN 10 MG PO TABS
10.0000 mg | ORAL_TABLET | Freq: Every day | ORAL | 11 refills | Status: DC
Start: 2021-08-09 — End: 2022-01-02
  Filled 2021-08-09: qty 30, 30d supply, fill #0
  Filled 2021-11-07: qty 14, 14d supply, fill #0
  Filled 2021-11-15: qty 30, 30d supply, fill #0

## 2021-08-09 MED ORDER — ENTRESTO 97-103 MG PO TABS
1.0000 | ORAL_TABLET | Freq: Two times a day (BID) | ORAL | 11 refills | Status: DC
  Filled 2021-08-09 – 2022-05-03 (×2): qty 60, 30d supply, fill #0

## 2021-08-09 NOTE — Patient Instructions (Signed)
Medication Instructions:  Your physician has recommended you make the following change in your medication:  1.) start Jardiance 10 mg - take one tablet daily  *If you need a refill on your cardiac medications before your next appointment, please call your pharmacy*   Lab Work: Today: Lp(a)  If you have labs (blood work) drawn today and your tests are completely normal, you will receive your results only by: MyChart Message (if you have MyChart) OR A paper copy in the mail If you have any lab test that is abnormal or we need to change your treatment, we will call you to review the results.   Testing/Procedures: Your physician has requested that you have an echocardiogram. Echocardiography is a painless test that uses sound waves to create images of your heart. It provides your doctor with information about the size and shape of your heart and how well your heart's chambers and valves are working. This procedure takes approximately one hour. There are no restrictions for this procedure.    Follow-Up: At [facility], you and your health needs are our priority.  As part of our continuing mission to provide you with exceptional heart care, we have created designated Provider Care Teams.  These Care Teams include your primary Cardiologist (physician) and Advanced Practice Providers (APPs -  Physician Assistants and Nurse Practitioners) who all work together to provide you with the care you need, when you need it.   Your next appointment:   12 month(s)  The format for your next appointment:   In Person  Provider:   [name], MD     Important Information About Sugar

## 2021-08-10 LAB — LIPOPROTEIN A (LPA): Lipoprotein (a): 257.7 nmol/L — ABNORMAL HIGH (ref <75.0)

## 2021-08-15 ENCOUNTER — Other Ambulatory Visit: Payer: Self-pay

## 2021-08-22 ENCOUNTER — Ambulatory Visit (HOSPITAL_COMMUNITY): Payer: Medicaid Other | Attending: Cardiovascular Disease

## 2021-08-22 DIAGNOSIS — I428 Other cardiomyopathies: Secondary | ICD-10-CM | POA: Diagnosis not present

## 2021-08-22 DIAGNOSIS — Z6841 Body Mass Index (BMI) 40.0 and over, adult: Secondary | ICD-10-CM | POA: Insufficient documentation

## 2021-08-22 DIAGNOSIS — E1159 Type 2 diabetes mellitus with other circulatory complications: Secondary | ICD-10-CM | POA: Diagnosis not present

## 2021-08-22 DIAGNOSIS — E785 Hyperlipidemia, unspecified: Secondary | ICD-10-CM | POA: Diagnosis not present

## 2021-08-22 DIAGNOSIS — E11311 Type 2 diabetes mellitus with unspecified diabetic retinopathy with macular edema: Secondary | ICD-10-CM | POA: Diagnosis not present

## 2021-08-22 DIAGNOSIS — I2510 Atherosclerotic heart disease of native coronary artery without angina pectoris: Secondary | ICD-10-CM

## 2021-08-22 DIAGNOSIS — I152 Hypertension secondary to endocrine disorders: Secondary | ICD-10-CM | POA: Insufficient documentation

## 2021-08-22 DIAGNOSIS — E1169 Type 2 diabetes mellitus with other specified complication: Secondary | ICD-10-CM | POA: Diagnosis not present

## 2021-08-22 DIAGNOSIS — Z794 Long term (current) use of insulin: Secondary | ICD-10-CM | POA: Insufficient documentation

## 2021-08-22 LAB — ECHOCARDIOGRAM COMPLETE
Area-P 1/2: 3.99 cm2
S' Lateral: 2.3 cm

## 2021-08-28 NOTE — Addendum Note (Signed)
Addended by: Vernard Gambles on: 08/28/2021 09:52 AM   Modules accepted: Orders

## 2021-09-04 ENCOUNTER — Ambulatory Visit (INDEPENDENT_AMBULATORY_CARE_PROVIDER_SITE_OTHER): Payer: Medicaid Other | Admitting: Podiatry

## 2021-09-04 ENCOUNTER — Encounter: Payer: Self-pay | Admitting: Podiatry

## 2021-09-04 DIAGNOSIS — M79674 Pain in right toe(s): Secondary | ICD-10-CM

## 2021-09-04 DIAGNOSIS — E119 Type 2 diabetes mellitus without complications: Secondary | ICD-10-CM

## 2021-09-04 DIAGNOSIS — B351 Tinea unguium: Secondary | ICD-10-CM | POA: Diagnosis not present

## 2021-09-04 DIAGNOSIS — N179 Acute kidney failure, unspecified: Secondary | ICD-10-CM

## 2021-09-04 DIAGNOSIS — M79675 Pain in left toe(s): Secondary | ICD-10-CM | POA: Diagnosis not present

## 2021-09-04 NOTE — Progress Notes (Signed)
This patient presents to the office with chief complaint of long thick deformed big toenails both feet.  She says she had surgery performed years ago and both great toenails were removed and she asked for the nails to regrow.  The nails have returned and are deformed  The nails become painful when the nails are thick.  She says she is diabetic.  She presents for evaluation and treatment.    General Appearance  Alert, conversant and in no acute stress.  Vascular  Dorsalis pedis and posterior tibial  pulses are palpable  bilaterally.  Capillary return is within normal limits  bilaterally. Temperature is within normal limits  bilaterally.  Neurologic  Senn-Weinstein monofilament wire test within normal limits  bilaterally. Muscle power within normal limits bilaterally.  Nails Thick disfigured discolored nails with subungual debris   hallux  bilaterally and second toenail left foot.. No evidence of bacterial infection or drainage bilaterally.  Orthopedic  No limitations of motion  feet .  No crepitus or effusions noted.  No bony pathology or digital deformities noted.  Skin  normotropic skin with no porokeratosis noted bilaterally.  No signs of infections or ulcers noted.   Onychomycosis  B/L  Debrided nails with nail nipper followed by dremel tool usage.    RTC 3 months.   Helane Gunther DPM

## 2021-09-06 NOTE — Progress Notes (Signed)
Patient ID: Felicia [name]                 DOB: Nov 19, 1960                    MRN: [id number]     HPI: AMOUR TRIGG is a 61 y.o. female patient referred to pharmacy clinic by Dr. Ali Lowe to initiate weight loss therapy with GLP1-RA and to discuss recommendations for hyperlipidemia. PMH is significant for obesity complicated by chronic medical conditions including nonischemic cardiomyopathy, T2DM on Jardiance and Humulin 70/30, retinopathy and macular edema, HTN, HLD, systolic CHF, and onychomycosis. CT angio chest 2019 revealed coronary artery calcifications. Most recent BMI 43.04.  Pt previously on Trulicity which was stopped in 2022. On 03/22/2021 pt saw Geryl Rankins, NP and recommended if A1C above 7 to add Trulicity or Ozempic. Trulicity was prescribed 04/11/21. Pt saw Dr. Ali Lowe on 08/09/21, Jardiance 10 mg daily was started and Lp(a) was ordered which came back at 257.7.  Pt presented to PharmD clinic today. Patient has been tolerating rosuvastatin 10 mg daily without experiencing side effects. She shares that she has also been taking Trulicity and last picked it up from the pharmacy about a month ago and is due for her next injection. She has been tolerating Trulicity without issues. She shares that her Trulicity dose was never changed, as she has been on 0.7 mg weekly since she was re-started on Trulicity back in March. She shares that her blood sugars at home are around 100 when they are low, and up to 200 when high. She typically checks her blood glucose right before she eats. She confirms that she is taking Humulin 70/30 42 units twice daily. She shares that since starting Trulicity she has not been eating as much. She has been drinking mostly water. She says that when she isn't drinking a lot of water she feels "dried out." She shares that her kids usually do all of the cooking, she eats fast food regularly and regularly eats salads for lunch but eats them with processed deli meats and  store bought vinaigrettes. She has sausage or bacon and eggs with breakfast, and fried chicken or baked chicken with a side of vegetables for dinner or spaghetti. She would like to start walking more but she does not feel safe walking by herself. She shares that sometimes her daughter walks in the morning. She is also thinking about getting a walking pad to use at home.   Current lipid medications: rosuvastatin 10 mg daily  Previously tried: atorvastatin 20 mg daily (switched to rosuvastatin)  Risk Factors: HTN, T2DM, HLD, obesity, elevated lp(a) LDL goal: ideally <55 (due to CAC, elevated lp(a))  Current weight management medications: Trulicity 0.7 mg subcutaneous once weekly   Previously tried meds: Trulicity (back in 3790)   Baseline weight/BMI: 202 lbs, BMI 43.04  Diet:  -Breakfast: sausage bacon boiled eggs not too many biscuits, sometimes doesn't eat in the mornings and sometimes leftover supper  -Lunch/Brunch: salad 3x per week croutons with chicken or [place] sliced lunch meat and crackers uses vinaigrette dressing  -Dinner: fried chicken or backed chicken, spaghetti or whatever her kids cook, eats out more than necessary- [employer], [employer]. Cooks green bean's, broccoli, corn  -Snacks: snacks sometimes between lunch and dinner - pork skins, strawberries watermelon, nutty buddy ice cream on occasion, candy and chocolate, pickles -Drinks: 1.5 cups black coffee with 2 tsp sugar  Doesn't like plain white rice, when gets [place] food gets  vegetable rice, doesn't like food plain and likes sauces Kids do all the cooking  Drinks plenty of water   Exercise:  Walking once or twice a week, walks when she shops   Family History: mother- stroke, father-lung cancer, daughter- diabetes   Social History: 0.5 ppd smoking, no alcohol   Labs:  08/09/21 lp(a) - 257.7 04/09/21 lipid panel- TC 124, TG 114, HDL 42, LDL 61 (on rosuvastatin 10 mg daily)   Lab Results  Component Value Date    HGBA1C 10.6 (H) 04/09/2021   Wt Readings from Last 1 Encounters:  09/07/21 202 lb 6.4 oz (91.8 kg)   BP Readings from Last 1 Encounters:  08/09/21 122/80   Pulse Readings from Last 1 Encounters:  08/09/21 94    Past Medical History:  Diagnosis Date   Cardiomyopathy    CHF (congestive heart failure) (HCC)    minamal   COPD (chronic obstructive pulmonary disease) (HCC)    DDD (degenerative disc disease), lumbar    Diabetes mellitus    Hypertension    Non compliance w medication regimen    Normal echocardiogram    LVEF 25-30% 03/27/2010    Current Outpatient Medications on File Prior to Visit  Medication Sig Dispense Refill   acetaminophen (TYLENOL) 500 MG tablet Take 1,500 mg by mouth daily as needed for mild pain or headache.     albuterol (PROVENTIL) (2.5 MG/3ML) 0.083% nebulizer solution Take 3 mLs (2.5 mg total) by nebulization every 6 (six) hours as needed for wheezing or shortness of breath. 150 mL 1   aspirin 81 MG chewable tablet Chew 81 mg by mouth daily.     Blood Glucose Monitoring Suppl (TRUE METRIX METER) w/Device KIT Use to check blood sugars up to 3 times per day 1 kit 0   carvedilol (COREG) 6.25 MG tablet Take 1 tablet (6.25 mg total) by mouth 2 (two) times daily with a meal. 60 tablet 1   empagliflozin (JARDIANCE) 10 MG TABS tablet Take 1 tablet (10 mg total) by mouth daily before breakfast. 30 tablet 11   furosemide (LASIX) 20 MG tablet Take 1 tablet (20 mg total) by mouth daily. 90 tablet 0   glucose blood (TRUE METRIX BLOOD GLUCOSE TEST) test strip Use to check blood sugars up to 3 times per day 100 each 12   hydrALAZINE (APRESOLINE) 50 MG tablet Take 1 tablet (50 mg total) by mouth 3 (three) times daily. 90 tablet 1   insulin NPH-regular Human (HUMULIN 70/30) (70-30) 100 UNIT/ML injection Inject 42 Units into the skin 2 (two) times daily with a meal. 80 mL 1   Insulin Syringe-Needle U-100 30G X 5/16" 0.5 ML MISC use as directed, to inject into the skin twice a  day 200 each 6   isosorbide mononitrate (IMDUR) 30 MG 24 hr tablet Take 1 tablet (30 mg total) by mouth daily. 30 tablet 1   latanoprost (XALATAN) 0.005 % ophthalmic solution Instill 1 drop into both eyes at bedtime 7.5 mL 4   Multiple Vitamin (MULTIVITAMIN WITH MINERALS) TABS tablet Take 1 tablet by mouth once a week.      rosuvastatin (CRESTOR) 10 MG tablet TAKE 1 TABLET (10 MG TOTAL) BY MOUTH DAILY. 30 tablet 6   sacubitril-valsartan (ENTRESTO) 97-103 MG Take 1 tablet by mouth 2 (two) times daily. 60 tablet 11   spironolactone (ALDACTONE) 25 MG tablet Take 1 tablet (25 mg total) by mouth daily. 90 tablet 3   tobramycin (TOBREX) 0.3 % ophthalmic solution Instill 1  drop in left eye four times a day; Begin 1 day prior to procedure, use the day of procedure and 1 day after then stop 5 mL 5   TRUEplus Lancets 28G MISC Use to check blood sugars 3 times per day 100 each 11   VENTOLIN HFA 108 (90 Base) MCG/ACT inhaler Inhale 2 puffs into the lungs every 6 (six) hours as needed for wheezing or shortness of breath. 18 g 2   No current facility-administered medications on file prior to visit.    Allergies  Allergen Reactions   Banana Itching   Latex Itching     Assessment/Plan:  1.Hyperlipidemia - LDL-C of 61 is above goal of <55 and Lp(a) elevated at 257.7. Discussed with patient that elevated Lp(a) is a genetic risk factor for heart disease and that aggressively targeting a lower LDL-C will help reduce her risk of having a cardiovascular event. Discussed that while there are medications such as PSCK-9 inhibitors that have been shown to reduce Lp(a) by up to 25%, since her LDL-C is not elevated her insurance would not cover this. Also explained that we don't yet have data that demonstrates cardiovascular benefit with lowering Lp(a) by a certain percentage. Patient would also not be eligible for a trial as she has not had an ASCVD event. Discussed adding an additional medication called ezetimibe which  could help lower her LDL-C by about an additional 20%. Patient is agreeable to adding this medication. Will start ezetimibe 10 mg daily and have patient continue taking rosuvastatin 10 mg daily. We will re-check lipid panel, apoB and LFTs in two months. At this time, if needed may consider increasing rosuvastatin to 20 mg daily.   2. Weight loss/T2DM-  A1C of 10.6% is above goal of <7. Pt recently started Jardiance 10 mg daily. Pt has been consistently taking and tolerating Trulicity at 0.7 mg subcutaneous once weekly since March of 2023. Explained to patient that increasing Trulicity dose as tolerated will help to better control her diabetes and offer better weight loss. Discussed that GLP-1RA medications have also been shown to protect from cardiovascular events. Since patient is tolerating current dose, will increase to next dose of Trulicity 1.5 mg subcutaneous once weekly. Instructed patient to reduce Humulin 70/30 to 36 units BID. Emphasized importance of checking blood glucose at home and writing down blood glucose values. Informed patient that we would like her blood glucose to be 80-130 mg/dL prior to meals and <180 mg/dL two hours after meals. Will follow up with patient in one month over the phone to ask about tolerability, assess blood glucose control, and determine if patient can be up-titrated further. Recommended patient to try and incorporate more veggies and high fiber foods in her diet such as adding beans lentils, lean proteins such as baked chicken which she already enjoys. Also suggested to try whole grain pasta instead of regular pasta and brown rice instead of white rice. Suggested patient can try reducing sugar in coffee to 1 tsp instead of 2 tsp. Patient was offered information about additional benefits with her insurance to hopefully get her access to a gym membership. Also recommended that she could try joining her daughter for morning walks. Due to supply issues, will leave patient on  Trulicity instead of switching to Ozempic.   Courtney Jackson, PharmD PGY1 Pharmacy Resident   Melissa D Maccia, Pharm.D, BCPS, CPP [facility]  [address], [place], [place] [address]  Phone: [phone]; Fax: [phone]      09/07/2021  4:52 PM

## 2021-09-07 ENCOUNTER — Encounter: Payer: Self-pay | Admitting: Pharmacist

## 2021-09-07 ENCOUNTER — Ambulatory Visit: Payer: Medicaid Other | Admitting: Pharmacist

## 2021-09-07 ENCOUNTER — Other Ambulatory Visit: Payer: Self-pay

## 2021-09-07 VITALS — Wt 202.4 lb

## 2021-09-07 DIAGNOSIS — E785 Hyperlipidemia, unspecified: Secondary | ICD-10-CM | POA: Diagnosis not present

## 2021-09-07 DIAGNOSIS — E1169 Type 2 diabetes mellitus with other specified complication: Secondary | ICD-10-CM

## 2021-09-07 DIAGNOSIS — Z794 Long term (current) use of insulin: Secondary | ICD-10-CM

## 2021-09-07 MED ORDER — EZETIMIBE 10 MG PO TABS
10.0000 mg | ORAL_TABLET | Freq: Every day | ORAL | 3 refills | Status: DC
  Filled 2021-09-07: qty 90, 90d supply, fill #0

## 2021-09-07 MED ORDER — TRULICITY 1.5 MG/0.5ML ~~LOC~~ SOAJ
1.5000 mg | SUBCUTANEOUS | 0 refills | Status: DC
  Filled 2021-09-07: qty 2, 28d supply, fill #0

## 2021-09-07 NOTE — Patient Instructions (Addendum)
Start taking ezetimibe 10 mg daily. Continue rosuvastatin 10 mg daily. We will recheck lipid labs in 2 months.   We are increasing Trulicity to 1.5 mg injection once weekly. We will call you in a month to see how you are tolerating.   Decrease insulin to 36 units twice daily but keep an eye on your blood sugars, start writing them down.   We would like your blood sugar before a meal to be 80 to 130 mg/dL and two hours after the a meal less than 180 mg/dL.  Try incorporating more veggies and higher fiber foods in your diet such as beans, lentils, lean proteins - baked chicken is a great option  Try to use whole grain pasta instead of regular pasta and try brown rice instead of white rice.   Can try having 1 teaspoon of sugar instead of 2 in your coffee.   Continue drinking plenty of water.   Try to start walking more throughout the week. Call your insurance to ask about discounted gym memberships.

## 2021-09-10 ENCOUNTER — Other Ambulatory Visit: Payer: Self-pay

## 2021-09-10 ENCOUNTER — Other Ambulatory Visit (HOSPITAL_COMMUNITY): Payer: Self-pay

## 2021-09-11 ENCOUNTER — Other Ambulatory Visit: Payer: Self-pay

## 2021-09-19 ENCOUNTER — Ambulatory Visit: Payer: Medicaid Other | Admitting: Critical Care Medicine

## 2021-10-01 ENCOUNTER — Telehealth: Payer: Self-pay | Admitting: Pharmacist

## 2021-10-01 NOTE — Telephone Encounter (Signed)
Attempted to call patient to check in on Trulicity tolerability, however patient did not answer and voicemailbox is full.

## 2021-10-05 ENCOUNTER — Other Ambulatory Visit: Payer: Self-pay

## 2021-10-05 MED ORDER — TRULICITY 3 MG/0.5ML ~~LOC~~ SOAJ
3.0000 mg | SUBCUTANEOUS | 0 refills | Status: DC
  Filled 2021-10-05 – 2022-02-07 (×2): qty 2, 28d supply, fill #0

## 2021-10-05 NOTE — Addendum Note (Signed)
Addended by: Malena Peer D on: 10/05/2021 02:11 PM   Modules accepted: Orders

## 2021-10-05 NOTE — Telephone Encounter (Signed)
Called pt. She states she is doing well on trulicty 1.5mg . Has 1 more shot in her box. BG has been a "little high". Will increase trulicity to 3mg  weekly and follow up in 1 month.

## 2021-10-12 ENCOUNTER — Other Ambulatory Visit: Payer: Self-pay

## 2021-11-05 ENCOUNTER — Telehealth: Payer: Self-pay | Admitting: Pharmacist

## 2021-11-05 NOTE — Telephone Encounter (Signed)
Called pt to see how she was doing on trulicity 3mg . How her blood sugar was doing and if she was able to implement any exercise? VM full could not leave message

## 2021-11-06 ENCOUNTER — Other Ambulatory Visit: Payer: Self-pay | Admitting: Nurse Practitioner

## 2021-11-06 ENCOUNTER — Other Ambulatory Visit: Payer: Self-pay

## 2021-11-06 ENCOUNTER — Other Ambulatory Visit: Payer: Self-pay | Admitting: Family Medicine

## 2021-11-06 ENCOUNTER — Other Ambulatory Visit (HOSPITAL_COMMUNITY): Payer: Self-pay

## 2021-11-06 DIAGNOSIS — I5022 Chronic systolic (congestive) heart failure: Secondary | ICD-10-CM

## 2021-11-06 DIAGNOSIS — I10 Essential (primary) hypertension: Secondary | ICD-10-CM

## 2021-11-07 ENCOUNTER — Other Ambulatory Visit: Payer: Self-pay

## 2021-11-07 ENCOUNTER — Other Ambulatory Visit: Payer: Medicaid Other

## 2021-11-08 ENCOUNTER — Other Ambulatory Visit: Payer: Medicaid Other

## 2021-11-08 ENCOUNTER — Other Ambulatory Visit: Payer: Self-pay

## 2021-11-12 ENCOUNTER — Other Ambulatory Visit: Payer: Self-pay

## 2021-11-12 ENCOUNTER — Other Ambulatory Visit: Payer: Self-pay | Admitting: Nurse Practitioner

## 2021-11-12 DIAGNOSIS — E1169 Type 2 diabetes mellitus with other specified complication: Secondary | ICD-10-CM

## 2021-11-12 NOTE — Telephone Encounter (Signed)
Patient missed her lab apt for lipids. Called to reschedule and to see how she was doing on trulicty 3mg . No answer. Could not leave VM

## 2021-11-14 ENCOUNTER — Other Ambulatory Visit: Payer: Self-pay

## 2021-11-15 ENCOUNTER — Other Ambulatory Visit: Payer: Self-pay

## 2021-11-18 MED ORDER — HUMULIN 70/30 (70-30) 100 UNIT/ML ~~LOC~~ SUSP
42.0000 [IU] | Freq: Two times a day (BID) | SUBCUTANEOUS | 1 refills | Status: DC
  Filled 2021-11-18 – 2021-12-26 (×2): qty 70, 83d supply, fill #0
  Filled 2022-06-27: qty 70, 83d supply, fill #1
  Filled 2022-09-27: qty 20, 24d supply, fill #2

## 2021-11-19 ENCOUNTER — Other Ambulatory Visit: Payer: Self-pay

## 2021-11-20 ENCOUNTER — Telehealth: Payer: Self-pay

## 2021-11-20 ENCOUNTER — Other Ambulatory Visit: Payer: Self-pay

## 2021-11-20 NOTE — Telephone Encounter (Signed)
**Note De-Identified [name] Obfuscation** [facility] Case ID: [id number] Outcome: Approved today Coverage Starts on: 11/20/2021 12:00:00 AM, Coverage Ends on: 11/20/2022 12:00:00 AM. Drug Jardiance 10MG  tablets Form [employer] [facility] Electronic [place] Form [phone] NCPDP)  I have notified Felicia [name] (Ph: [phone]) of this approval.

## 2021-11-22 ENCOUNTER — Other Ambulatory Visit: Payer: Self-pay

## 2021-11-29 ENCOUNTER — Other Ambulatory Visit: Payer: Self-pay

## 2021-12-03 ENCOUNTER — Other Ambulatory Visit: Payer: Self-pay

## 2021-12-05 ENCOUNTER — Ambulatory Visit: Payer: Medicaid Other | Admitting: Podiatry

## 2021-12-26 ENCOUNTER — Other Ambulatory Visit (HOSPITAL_COMMUNITY): Payer: Self-pay

## 2021-12-26 ENCOUNTER — Other Ambulatory Visit: Payer: Self-pay

## 2021-12-26 ENCOUNTER — Ambulatory Visit: Payer: Medicaid Other

## 2021-12-26 ENCOUNTER — Telehealth: Payer: Self-pay | Admitting: Nurse Practitioner

## 2021-12-27 ENCOUNTER — Ambulatory Visit: Payer: Medicaid Other | Attending: Internal Medicine

## 2021-12-27 DIAGNOSIS — E1169 Type 2 diabetes mellitus with other specified complication: Secondary | ICD-10-CM

## 2021-12-27 DIAGNOSIS — E785 Hyperlipidemia, unspecified: Secondary | ICD-10-CM | POA: Diagnosis not present

## 2021-12-29 LAB — HEPATIC FUNCTION PANEL
ALT: 22 IU/L (ref 0–32)
AST: 13 IU/L (ref 0–40)
Albumin: 4.1 g/dL (ref 3.8–4.9)
Alkaline Phosphatase: 177 IU/L — ABNORMAL HIGH (ref 44–121)
Bilirubin Total: 0.4 mg/dL (ref 0.0–1.2)
Bilirubin, Direct: 0.17 mg/dL (ref 0.00–0.40)
Total Protein: 6.5 g/dL (ref 6.0–8.5)

## 2021-12-29 LAB — LIPID PANEL
Chol/HDL Ratio: 2.3 ratio (ref 0.0–4.4)
Cholesterol, Total: 97 mg/dL — ABNORMAL LOW (ref 100–199)
HDL: 43 mg/dL (ref >39)
LDL Chol Calc (NIH): 34 mg/dL (ref 0–99)
Triglycerides: 109 mg/dL (ref 0–149)
VLDL Cholesterol Cal: 20 mg/dL (ref 5–40)

## 2021-12-29 LAB — APOLIPOPROTEIN B: Apolipoprotein B: 55 mg/dL (ref <90)

## 2021-12-31 ENCOUNTER — Telehealth: Payer: Self-pay | Admitting: Pharmacist

## 2021-12-31 NOTE — Telephone Encounter (Signed)
LDL-C and ApoB well controlled on rosuvastatin 10mg  daily and ezetimibe 10mg  daily. Continue. LPa high, but cannot get PCSK9i from insurance due to well controlled LDL-C.   This was previously discussed with patient.   LVM per DPR that labs well controlled. Continue rosuvastatin 10mg  daily and ezetimibe 10mg  daily.  Requested call back to discuss her Trulicity.

## 2022-01-01 ENCOUNTER — Other Ambulatory Visit: Payer: Self-pay

## 2022-01-02 ENCOUNTER — Other Ambulatory Visit: Payer: Medicaid Other

## 2022-01-02 ENCOUNTER — Ambulatory Visit: Payer: Medicaid Other | Attending: Cardiovascular Disease | Admitting: Pharmacist

## 2022-01-02 ENCOUNTER — Other Ambulatory Visit: Payer: Self-pay

## 2022-01-02 VITALS — Wt 190.0 lb

## 2022-01-02 DIAGNOSIS — E11311 Type 2 diabetes mellitus with unspecified diabetic retinopathy with macular edema: Secondary | ICD-10-CM | POA: Diagnosis not present

## 2022-01-02 DIAGNOSIS — Z794 Long term (current) use of insulin: Secondary | ICD-10-CM | POA: Diagnosis not present

## 2022-01-02 DIAGNOSIS — E669 Obesity, unspecified: Secondary | ICD-10-CM | POA: Diagnosis not present

## 2022-01-02 MED ORDER — ACCU-CHEK SOFTCLIX LANCETS MISC
11 refills | Status: AC
  Filled 2022-01-02: qty 100, 33d supply, fill #0

## 2022-01-02 MED ORDER — TRUE METRIX BLOOD GLUCOSE TEST VI STRP
ORAL_STRIP | 12 refills | Status: AC
  Filled 2022-01-02: qty 100, 33d supply, fill #0

## 2022-01-02 MED ORDER — EMPAGLIFLOZIN 10 MG PO TABS
10.0000 mg | ORAL_TABLET | Freq: Every day | ORAL | 3 refills | Status: DC
  Filled 2022-01-02: qty 90, 90d supply, fill #0
  Filled 2022-05-03: qty 90, 90d supply, fill #1
  Filled 2022-09-18 – 2022-09-27 (×2): qty 90, 90d supply, fill #2

## 2022-01-02 MED ORDER — ACCU-CHEK GUIDE W/DEVICE KIT
PACK | 0 refills | Status: AC
  Filled 2022-01-02: qty 1, 30d supply, fill #0

## 2022-01-02 NOTE — Progress Notes (Signed)
Patient ID: Felicia [name]                 DOB: Dec 22, 1960                    MRN: [id number]     HPI: Felicia [name] is a 61 y.o. female patient referred to pharmacy clinic by Dr. Ali Lowe to initiate weight loss therapy with GLP1-RA and to discuss recommendations for hyperlipidemia. PMH is significant for obesity complicated by chronic medical conditions including nonischemic cardiomyopathy, T2DM on Jardiance and Humulin 70/30, retinopathy and macular edema, HTN, HLD, systolic CHF, and onychomycosis. CT angio chest 2019 revealed coronary artery calcifications. Most recent BMI 43.04.  Pt previously on Trulicity which was stopped in 2022. On 03/22/2021 pt saw Geryl Rankins, NP and recommended if A1C above 7 to add Trulicity or Ozempic. Trulicity was prescribed 04/11/21. Pt saw Dr. Ali Lowe on 08/09/21, Jardiance 10 mg daily was started and Lp(a) was ordered which came back at 257.7.  I saw patient in clinic 09/07/21. Ezetimibe was added to her rosuvastatin with good reduction in LDL-C and ApoB seen on labs 12/7 (LDL-C 34, ApoB 55). Her Trulicity was also increased to 1.68m weekly and reduced Humulin 70/30 to 36 units BID. Trulicity was then increased to 368mweekly on 9/11. Have been unable to reach patient since then to discuss.   Pt presented to PharmD clinic today. Patient has been tolerating rosuvastatin 10 mg daily and ezetimibe without experiencing side effects. Also doing well on Trulicity 66m466meekly. She never started Jar[place]aid her insurance didn't cover it, but it looks like we got the PA approved. Rx sent to pharmacy. She also states she can't find her BG meter. Requested and new rx and supplies be sent to pharmacy. Hasn't checked her BG in about a month.  Has lost 12lb sine August. Eating a lot more vegetables and fresh fruit. She was walking at the park, but now that it is cold she has not. She is doing chair exercises.   Current lipid medications: rosuvastatin 10 mg daily, ezetimibe  49m35mily Previously tried: atorvastatin 20 mg daily (switched to rosuvastatin)  Risk Factors: HTN, T2DM, HLD, obesity, elevated lp(a) LDL goal: ideally <55 (due to CAC, elevated lp(a))  Current weight management medications: Trulicity 3 mg subcutaneous once weekly   Previously tried meds: Trulicity (back in 20225364Baseline weight/BMI: 202 lbs, BMI 43.04  Diet:  -Breakfast: fresh fruit, boiled eggs, sausage, 1 pancake every now and then -Lunch/Brunch: salad 3x per week croutons with chicken or [place] lunch meat and crackers uses vinaigrette dressing or left overs, fries or hot dog, sandwich -Dinner: meat and vegetable from home -Snacks: fruit, ginger snap cookies  -Drinks: water, green tea, juice, some Gatorade zero, herbal tea w/ honey Kids cook most of the time  Exercise:  Walking once or twice a week, walks when she shops  Chair exercises, not walking much bc of the cold  Family History: mother- stroke, father-lung cancer, daughter- diabetes   Social History: 0.5 ppd smoking, no alcohol   Labs: 12/7 (LDL-C 34, ApoB 55).    Lab Results  Component Value Date   HGBA1C 10.6 (H) 04/09/2021   Wt Readings from Last 1 Encounters:  09/07/21 202 lb 6.4 oz (91.8 kg)   BP Readings from Last 1 Encounters:  08/09/21 122/80   Pulse Readings from Last 1 Encounters:  08/09/21 94    Past Medical History:  Diagnosis Date  Cardiomyopathy    CHF (congestive heart failure) (HCC)    minamal   COPD (chronic obstructive pulmonary disease) (HCC)    DDD (degenerative disc disease), lumbar    Diabetes mellitus    Hypertension    Non compliance w medication regimen    Normal echocardiogram    LVEF 25-30% 03/27/2010    Current Outpatient Medications on File Prior to Visit  Medication Sig Dispense Refill   acetaminophen (TYLENOL) 500 MG tablet Take 1,500 mg by mouth daily as needed for mild pain or headache.     albuterol (PROVENTIL) (2.5 MG/3ML) 0.083% nebulizer solution  Take 3 mLs (2.5 mg total) by nebulization every 6 (six) hours as needed for wheezing or shortness of breath. 150 mL 1   aspirin 81 MG chewable tablet Chew 81 mg by mouth daily.     Blood Glucose Monitoring Suppl (TRUE METRIX METER) w/Device KIT Use to check blood sugars up to 3 times per day 1 kit 0   carvedilol (COREG) 6.25 MG tablet Take 1 tablet (6.25 mg total) by mouth 2 (two) times daily with a meal. 60 tablet 1   Dulaglutide (TRULICITY) 3 XJ/8.8TG SOPN Inject 3 mg as directed once a week. 2 mL 0   empagliflozin (JARDIANCE) 10 MG TABS tablet Take 1 tablet (10 mg total) by mouth daily before breakfast. 30 tablet 11   ezetimibe (ZETIA) 10 MG tablet Take 1 tablet (10 mg total) by mouth daily. 90 tablet 3   furosemide (LASIX) 20 MG tablet Take 1 tablet (20 mg total) by mouth daily. 90 tablet 0   glucose blood (TRUE METRIX BLOOD GLUCOSE TEST) test strip Use to check blood sugars up to 3 times per day 100 each 12   hydrALAZINE (APRESOLINE) 50 MG tablet Take 1 tablet (50 mg total) by mouth 3 (three) times daily. 90 tablet 1   insulin NPH-regular Human (HUMULIN 70/30) (70-30) 100 UNIT/ML injection Inject 36 Units into the skin 2 (two) times daily with a meal. 80 mL 1   insulin NPH-regular Human (HUMULIN 70/30) (70-30) 100 UNIT/ML injection Inject 42 Units into the skin 2 (two) times daily with a meal. 80 mL 1   Insulin Syringe-Needle U-100 30G X 5/16" 0.5 ML MISC use as directed, to inject into the skin twice a day 200 each 6   isosorbide mononitrate (IMDUR) 30 MG 24 hr tablet Take 1 tablet (30 mg total) by mouth daily. 30 tablet 1   latanoprost (XALATAN) 0.005 % ophthalmic solution Instill 1 drop into both eyes at bedtime 7.5 mL 4   Multiple Vitamin (MULTIVITAMIN WITH MINERALS) TABS tablet Take 1 tablet by mouth once a week.      rosuvastatin (CRESTOR) 10 MG tablet TAKE 1 TABLET (10 MG TOTAL) BY MOUTH DAILY. 30 tablet 6   sacubitril-valsartan (ENTRESTO) 97-103 MG Take 1 tablet by mouth 2 (two) times  daily. 60 tablet 11   spironolactone (ALDACTONE) 25 MG tablet Take 1 tablet (25 mg total) by mouth daily. 90 tablet 3   tobramycin (TOBREX) 0.3 % ophthalmic solution Instill 1 drop in left eye four times a day; Begin 1 day prior to procedure, use the day of procedure and 1 day after then stop 5 mL 5   TRUEplus Lancets 28G MISC Use to check blood sugars 3 times per day 100 each 11   VENTOLIN HFA 108 (90 Base) MCG/ACT inhaler Inhale 2 puffs into the lungs every 6 (six) hours as needed for wheezing or shortness of breath. 18 g  2   No current facility-administered medications on file prior to visit.    Allergies  Allergen Reactions   Banana Itching   Latex Itching     Assessment/Plan:  1.Hyperlipidemia - LDL-C 34, ApoB 55 is at goal. Continue rosuvastatin 51m and ezetimibe 135mdaily.  2. Weight loss/T2DM- Patient has lost 10 lb. No recent A1C but she will see PCP Friday for DM check up. I sent in new BG meter and asked her to resume checking her BG right away. Start taking Jardiance. Would like to increase Trulicity, but need to know what her post prandial BG has been so I know if we need to decrease insulin. Will call patient next week for those readings.   MeRamond DialPharm.D, BCPS, CPP [facility] [id number]: [phone]: [phone]  01/02/2022  7:37 AM

## 2022-01-02 NOTE — Patient Instructions (Signed)
Start checking your blood sugars regularly. I will call you next week to discuss increasing Trulicity. Please try to increase exercise.  Adopting a Healthy Lifestyle.   Weight: Know what a healthy weight is for you (roughly BMI <25) and aim to maintain this. You can calculate your body mass index on your smart phone  Diet: Aim for 7+ servings of fruits and vegetables daily Limit animal fats in diet for cholesterol and heart health - choose grass fed whenever available Avoid highly processed foods (fast food burgers, tacos, fried chicken, pizza, hot dogs, french fries)  Saturated fat comes in the form of butter, lard, coconut oil, margarine, partially hydrogenated oils, and fat in meat. These increase your risk of cardiovascular disease.  Use healthy plant oils, such as olive, canola, soy, corn, sunflower and peanut.  Whole foods such as fruits, vegetables and whole grains have fiber  Men need > 38 grams of fiber per day Women need > 25 grams of fiber per day  Load up on vegetables and fruits - one-half of your plate: Aim for color and variety, and remember that potatoes dont count. Go for whole grains - one-quarter of your plate: Whole wheat, barley, wheat berries, quinoa, oats, brown rice, and foods made with them. If you want pasta, go with whole wheat pasta. Protein power - one-quarter of your plate: Fish, chicken, beans, and nuts are all healthy, versatile protein sources. Limit red meat. You need carbohydrates for energy! The type of carbohydrate is more important than the amount. Choose carbohydrates such as vegetables, fruits, whole grains, beans, and nuts in the place of white rice, white pasta, potatoes (baked or fried), macaroni and cheese, cakes, cookies, and donuts.  If youre thirsty, drink water. Coffee and tea are good in moderation, but skip sugary drinks and limit milk and dairy products to one or two daily servings. Keep sugar intake at 6 teaspoons or 24 grams or LESS        Exercise: Aim for 150 min of moderate intensity exercise weekly for heart health, and weights twice weekly for bone health Stay active - any steps are better than no steps! Aim for 7-9 hours of sleep daily

## 2022-01-04 ENCOUNTER — Other Ambulatory Visit (HOSPITAL_COMMUNITY)
Admission: RE | Admit: 2022-01-04 | Discharge: 2022-01-04 | Disposition: A | Discharge status: Home / Self Care | Payer: Medicaid Other | Source: Ambulatory Visit | Attending: Nurse Practitioner | Admitting: Nurse Practitioner

## 2022-01-04 ENCOUNTER — Encounter: Payer: Self-pay | Admitting: Nurse Practitioner

## 2022-01-04 ENCOUNTER — Other Ambulatory Visit: Payer: Self-pay

## 2022-01-04 ENCOUNTER — Ambulatory Visit: Payer: Medicaid Other | Attending: Nurse Practitioner | Admitting: Nurse Practitioner

## 2022-01-04 VITALS — BP 149/87 | HR 112 | Ht <= 58 in | Wt 191.2 lb

## 2022-01-04 DIAGNOSIS — E1169 Type 2 diabetes mellitus with other specified complication: Secondary | ICD-10-CM

## 2022-01-04 DIAGNOSIS — N760 Acute vaginitis: Secondary | ICD-10-CM

## 2022-01-04 DIAGNOSIS — J40 Bronchitis, not specified as acute or chronic: Secondary | ICD-10-CM | POA: Diagnosis not present

## 2022-01-04 DIAGNOSIS — I5022 Chronic systolic (congestive) heart failure: Secondary | ICD-10-CM

## 2022-01-04 DIAGNOSIS — I10 Essential (primary) hypertension: Secondary | ICD-10-CM

## 2022-01-04 DIAGNOSIS — Z794 Long term (current) use of insulin: Secondary | ICD-10-CM | POA: Diagnosis not present

## 2022-01-04 LAB — POCT GLYCOSYLATED HEMOGLOBIN (HGB A1C): HbA1c, POC (controlled diabetic range): 12.9 % — AB (ref 0.0–7.0)

## 2022-01-04 MED ORDER — CARVEDILOL 6.25 MG PO TABS
6.2500 mg | ORAL_TABLET | Freq: Two times a day (BID) | ORAL | 1 refills | Status: DC
  Filled 2022-01-04: qty 180, 90d supply, fill #0
  Filled 2022-09-18 – 2022-09-27 (×2): qty 180, 90d supply, fill #1

## 2022-01-04 MED ORDER — FUROSEMIDE 20 MG PO TABS
20.0000 mg | ORAL_TABLET | Freq: Every day | ORAL | 1 refills | Status: DC
  Filled 2022-01-04: qty 90, 90d supply, fill #0

## 2022-01-04 MED ORDER — EZETIMIBE 10 MG PO TABS
10.0000 mg | ORAL_TABLET | Freq: Every day | ORAL | 3 refills | Status: DC
  Filled 2022-01-04: qty 90, 90d supply, fill #0
  Filled 2022-06-27: qty 90, 90d supply, fill #1
  Filled 2022-09-27: qty 90, 90d supply, fill #2

## 2022-01-04 MED ORDER — AZITHROMYCIN 250 MG PO TABS
ORAL_TABLET | ORAL | 0 refills | Status: AC
  Filled 2022-01-04: qty 6, 5d supply, fill #0

## 2022-01-04 MED ORDER — BLOOD PRESSURE MONITOR DEVI: 0 refills | Status: DC

## 2022-01-04 MED ORDER — HYDRALAZINE HCL 50 MG PO TABS
50.0000 mg | ORAL_TABLET | Freq: Three times a day (TID) | ORAL | 1 refills | Status: DC
  Filled 2022-01-04: qty 270, 90d supply, fill #0

## 2022-01-04 NOTE — Progress Notes (Signed)
Assessment & Plan:  Felicia [name] was seen today for diabetes and hypertension.  Diagnoses and all orders for this visit:  Type 2 diabetes mellitus with other specified complication, with long-term current use of insulin (HCC) -     POCT glycosylated hemoglobin (Hb A1C) -     CMP14+EGFR Continue blood sugar control as discussed in office today, low carbohydrate diet, and regular physical exercise as tolerated, 150 minutes per week (30 min each day, 5 days per week, or 50 min 3 days per week). Keep blood sugar logs with fasting goal of 90-130 mg/dl, post prandial (after you eat) less than 180.  For Hypoglycemia: BS <60 and Hyperglycemia BS >400; contact the clinic ASAP. Annual eye exams and foot exams are recommended.   Acute vaginitis -     Cervicovaginal ancillary only  Bronchitis -     azithromycin (ZITHROMAX) 250 MG tablet; Take 2 tablets (500 mg total) by mouth daily for 1 day, THEN 1 tablet (250 mg total) daily for 4 days.  Chronic systolic heart failure (HCC) -     furosemide (LASIX) 20 MG tablet; Take 1 tablet (20 mg total) by mouth daily. -     carvedilol (COREG) 6.25 MG tablet; Take 1 tablet (6.25 mg total) by mouth 2 (two) times daily with a meal. DASH DIET  Primary hypertension -     hydrALAZINE (APRESOLINE) 50 MG tablet; Take 1 tablet (50 mg total) by mouth 3 (three) times daily. -     carvedilol (COREG) 6.25 MG tablet; Take 1 tablet (6.25 mg total) by mouth 2 (two) times daily with a meal. -     Blood Pressure Monitor DEVI; Please provide patient with insurance approved blood pressure monitor I10.0 Continue all antihypertensives as prescribed.  Reminded to bring in blood pressure log for follow  up appointment.  RECOMMENDATIONS: DASH/Mediterranean Diets are healthier choices for HTN.      Patient has been counseled on age-appropriate routine health concerns for screening and prevention. These are reviewed and up-to-date. Referrals have been placed accordingly.  Immunizations are up-to-date or declined.    Subjective:   Chief Complaint  Patient presents with   Diabetes   Hypertension   HPI Felicia [name] 61 y.o. female presents to office today for follow up to HTN, DM and HPL.   DM 2 Diabetes is not well controlled She states she had not picked up her Jardiance due to insurance issues and just recently started taking it a . She endorses adherence administering humulin 70/30 42 units BID and Truliclity 47m  weekly. I am concerned there may be an adherence issue. As of today she has not been monitoring her blood glucose levels as prescribed.  Lab Results  Component Value Date   HGBA1C 12.9 (A) 01/04/2022    Lab Results  Component Value Date   HGBA1C 10.6 (H) 04/09/2021    HTN Blood pressure is elevated today. She has not taken any of her blood pressure medications today.  Blood pressure device has been sent for her to begin monitoring her blood pressure at home.  BP Readings from Last 3 Encounters:  01/04/22 (!) 149/87  08/09/21 122/80  04/09/21 (!) 144/102     Bronchitis Patient presents for presents evaluation of fever, nasal congestion, and productive cough. Symptoms began several weeks ago and are unchanged since that time.  Past history is significant for COPD and tobacco abuse . Smokes 2-3  cigarettes per day.   Vaginitis Endorses vaginal itching and irritation over  the past few days.       Review of Systems  Constitutional:  Negative for fever, malaise/fatigue and weight loss.  HENT:  Positive for congestion. Negative for nosebleeds.   Eyes: Negative.  Negative for blurred vision, double vision and photophobia.  Respiratory:  Positive for cough, sputum production, shortness of breath and wheezing. Negative for hemoptysis.   Cardiovascular: Negative.  Negative for chest pain, palpitations and leg swelling.  Gastrointestinal: Negative.  Negative for heartburn, nausea and vomiting.  Genitourinary:        SEE HPI   Musculoskeletal: Negative.  Negative for myalgias.  Neurological: Negative.  Negative for dizziness, focal weakness, seizures and headaches.  Psychiatric/Behavioral: Negative.  Negative for suicidal ideas.     Past Medical History:  Diagnosis Date   Cardiomyopathy    CHF (congestive heart failure) (HCC)    minamal   COPD (chronic obstructive pulmonary disease) (HCC)    DDD (degenerative disc disease), lumbar    Diabetes mellitus    Hypertension    Non compliance w medication regimen    Normal echocardiogram    LVEF 25-30% 03/27/2010    Past Surgical History:  Procedure Laterality Date   CARDIAC CATHETERIZATION     RIGHT/LEFT HEART CATH AND CORONARY ANGIOGRAPHY N/A 05/29/2017   Procedure: RIGHT/LEFT HEART CATH AND CORONARY ANGIOGRAPHY;  Surgeon: Jolaine Artist, MD;  Location: [facility];  Service: Cardiovascular;  Laterality: N/A;   TUBAL LIGATION      Family History  Problem Relation Age of Onset   Stroke Mother    Lung cancer Father    Diabetes Daughter     Social History Reviewed with no changes to be made today.   Outpatient Medications Prior to Visit  Medication Sig Dispense Refill   Accu-Chek Softclix Lancets lancets Use as directed to test blood sugars three times a day. 100 each 11   acetaminophen (TYLENOL) 500 MG tablet Take 1,500 mg by mouth daily as needed for mild pain or headache.     albuterol (PROVENTIL) (2.5 MG/3ML) 0.083% nebulizer solution Take 3 mLs (2.5 mg total) by nebulization every 6 (six) hours as needed for wheezing or shortness of breath. 150 mL 1   Blood Glucose Monitoring Suppl (ACCU-CHEK GUIDE) w/Device KIT use as directed 1 kit 0   Dulaglutide (TRULICITY) 3 XL/2.4MW SOPN Inject 3 mg as directed once a week. 2 mL 0   empagliflozin (JARDIANCE) 10 MG TABS tablet Take 1 tablet (10 mg total) by mouth daily before breakfast. 90 tablet 3   glucose blood (TRUE METRIX BLOOD GLUCOSE TEST) test strip Use to check blood sugars up to 3 times per  day 100 each 12   insulin NPH-regular Human (HUMULIN 70/30) (70-30) 100 UNIT/ML injection Inject 42 Units into the skin 2 (two) times daily with a meal. 80 mL 1   Insulin Syringe-Needle U-100 30G X 5/16" 0.5 ML MISC use as directed, to inject into the skin twice a day 200 each 6   isosorbide mononitrate (IMDUR) 30 MG 24 hr tablet Take 1 tablet (30 mg total) by mouth daily. 30 tablet 1   Multiple Vitamin (MULTIVITAMIN WITH MINERALS) TABS tablet Take 1 tablet by mouth once a week.      rosuvastatin (CRESTOR) 10 MG tablet TAKE 1 TABLET (10 MG TOTAL) BY MOUTH DAILY. 30 tablet 6   sacubitril-valsartan (ENTRESTO) 97-103 MG Take 1 tablet by mouth 2 (two) times daily. 60 tablet 11   spironolactone (ALDACTONE) 25 MG tablet Take 1 tablet (  25 mg total) by mouth daily. 90 tablet 3   VENTOLIN HFA 108 (90 Base) MCG/ACT inhaler Inhale 2 puffs into the lungs every 6 (six) hours as needed for wheezing or shortness of breath. 18 g 2   carvedilol (COREG) 6.25 MG tablet Take 1 tablet (6.25 mg total) by mouth 2 (two) times daily with a meal. 60 tablet 1   furosemide (LASIX) 20 MG tablet Take 1 tablet (20 mg total) by mouth daily. 90 tablet 0   hydrALAZINE (APRESOLINE) 50 MG tablet Take 1 tablet (50 mg total) by mouth 3 (three) times daily. 90 tablet 1   insulin NPH-regular Human (HUMULIN 70/30) (70-30) 100 UNIT/ML injection Inject 36 Units into the skin 2 (two) times daily with a meal. 80 mL 1   aspirin 81 MG chewable tablet Chew 81 mg by mouth daily. (Patient not taking: Reported on 01/04/2022)     ezetimibe (ZETIA) 10 MG tablet Take 1 tablet (10 mg total) by mouth daily. (Patient not taking: Reported on 01/04/2022) 90 tablet 3   latanoprost (XALATAN) 0.005 % ophthalmic solution Instill 1 drop into both eyes at bedtime (Patient not taking: Reported on 01/04/2022) 7.5 mL 4   tobramycin (TOBREX) 0.3 % ophthalmic solution Instill 1 drop in left eye four times a day; Begin 1 day prior to procedure, use the day of procedure  and 1 day after then stop (Patient not taking: Reported on 01/04/2022) 5 mL 5   No facility-administered medications prior to visit.    Allergies  Allergen Reactions   Banana Itching   Latex Itching       Objective:    BP (!) 149/87   Pulse (!) 112   Ht 4' 9.5" (1.461 m)   Wt 191 lb 3.2 oz (86.7 kg)   LMP 08/02/2010   SpO2 98%   BMI 40.66 kg/m  Wt Readings from Last 3 Encounters:  01/04/22 191 lb 3.2 oz (86.7 kg)  01/02/22 190 lb (86.2 kg)  09/07/21 202 lb 6.4 oz (91.8 kg)    Physical Exam Vitals and nursing note reviewed.  Constitutional:      Appearance: She is well-developed.  HENT:     Head: Normocephalic and atraumatic.  Cardiovascular:     Rate and Rhythm: Normal rate and regular rhythm.     Heart sounds: Normal heart sounds. No murmur heard.    No friction rub. No gallop.  Pulmonary:     Effort: Pulmonary effort is normal. No tachypnea or respiratory distress.     Breath sounds: Normal breath sounds. No decreased breath sounds, wheezing, rhonchi or rales.  Chest:     Chest wall: No tenderness.  Abdominal:     General: Bowel sounds are normal.     Palpations: Abdomen is soft.  Musculoskeletal:        General: Normal range of motion.     Cervical back: Normal range of motion.  Skin:    General: Skin is warm and dry.  Neurological:     Mental Status: She is alert and oriented to person, place, and time.     Coordination: Coordination normal.  Psychiatric:        Behavior: Behavior normal. Behavior is cooperative.        Thought Content: Thought content normal.        Judgment: Judgment normal.          Patient has been counseled extensively about nutrition and exercise as well as the importance of adherence with medications and regular  follow-up. The patient was given clear instructions to go to ER or return to medical center if symptoms don't improve, worsen or new problems develop. The patient verbalized understanding.   Follow-up: Return in  about 3 months (around 04/05/2022).   Gildardo Pounds, FNP-BC [facility] [place], [place]   01/04/2022, 1:15 PM

## 2022-01-05 LAB — CMP14+EGFR
ALT: 33 IU/L — ABNORMAL HIGH (ref 0–32)
AST: 33 IU/L (ref 0–40)
Albumin/Globulin Ratio: 1.5 (ref 1.2–2.2)
Albumin: 4.1 g/dL (ref 3.8–4.9)
Alkaline Phosphatase: 151 IU/L — ABNORMAL HIGH (ref 44–121)
BUN/Creatinine Ratio: 13 (ref 9–23)
BUN: 14 mg/dL (ref 6–24)
Bilirubin Total: <0.2 mg/dL (ref 0.0–1.2)
CO2: 22 mmol/L (ref 20–29)
Calcium: 9.5 mg/dL (ref 8.7–10.2)
Chloride: 95 mmol/L — ABNORMAL LOW (ref 96–106)
Creatinine, Ser: 1.08 mg/dL — ABNORMAL HIGH (ref 0.57–1.00)
Globulin, Total: 2.7 g/dL (ref 1.5–4.5)
Glucose: 408 mg/dL — ABNORMAL HIGH (ref 70–99)
Potassium: 4.6 mmol/L (ref 3.5–5.2)
Sodium: 135 mmol/L (ref 134–144)
Total Protein: 6.8 g/dL (ref 6.0–8.5)
eGFR: 59 mL/min/{1.73_m2} — ABNORMAL LOW (ref >59)

## 2022-01-08 LAB — CERVICOVAGINAL ANCILLARY ONLY
Bacterial Vaginitis (gardnerella): POSITIVE — AB
Candida Glabrata: POSITIVE — AB
Candida Vaginitis: POSITIVE — AB
Chlamydia: NEGATIVE
Comment: NEGATIVE
Comment: NEGATIVE
Comment: NEGATIVE
Comment: NEGATIVE
Comment: NEGATIVE
Comment: NORMAL
Neisseria Gonorrhea: NEGATIVE
Trichomonas: NEGATIVE

## 2022-01-11 ENCOUNTER — Other Ambulatory Visit (INDEPENDENT_AMBULATORY_CARE_PROVIDER_SITE_OTHER): Payer: Self-pay | Admitting: Primary Care

## 2022-01-11 ENCOUNTER — Other Ambulatory Visit: Payer: Self-pay

## 2022-01-11 MED ORDER — METRONIDAZOLE 500 MG PO TABS
500.0000 mg | ORAL_TABLET | Freq: Two times a day (BID) | ORAL | 0 refills | Status: DC
  Filled 2022-01-11: qty 14, 7d supply, fill #0

## 2022-01-11 MED ORDER — FLUCONAZOLE 150 MG PO TABS
150.0000 mg | ORAL_TABLET | Freq: Every day | ORAL | 1 refills | Status: DC
  Filled 2022-01-11: qty 1, 1d supply, fill #0

## 2022-01-16 ENCOUNTER — Telehealth: Payer: Self-pay | Admitting: Nurse Practitioner

## 2022-01-16 NOTE — Telephone Encounter (Unsigned)
Medication Refill - Medication: a cream for her face, something new she thinks zelda forgot to call in.  Has the patient contacted their pharmacy? No. (She doesn't know the name Preferred Pharmacy (with phone number or street name): [facility]  Has the patient been seen for an appointment in the last year OR does the patient have an upcoming appointment? Yes.    Agent: Please be advised that RX refills may take up to 3 business days. We ask that you follow-up with your pharmacy.

## 2022-01-22 ENCOUNTER — Other Ambulatory Visit: Payer: Self-pay | Admitting: Nurse Practitioner

## 2022-01-22 DIAGNOSIS — L209 Atopic dermatitis, unspecified: Secondary | ICD-10-CM

## 2022-01-22 MED ORDER — TRIAMCINOLONE ACETONIDE 0.025 % EX OINT
1.0000 | TOPICAL_OINTMENT | Freq: Two times a day (BID) | CUTANEOUS | 0 refills | Status: DC
  Filled 2022-01-22 – 2022-02-05 (×2): qty 60, 30d supply, fill #0

## 2022-01-22 NOTE — Telephone Encounter (Signed)
Triamcinolone ointment has been sent

## 2022-01-23 ENCOUNTER — Other Ambulatory Visit: Payer: Self-pay

## 2022-01-23 NOTE — Telephone Encounter (Signed)
Patient's phone number is not a valid number. Message will be sent through my chart.

## 2022-01-29 ENCOUNTER — Other Ambulatory Visit: Payer: Self-pay

## 2022-01-31 ENCOUNTER — Other Ambulatory Visit: Payer: Self-pay

## 2022-02-01 ENCOUNTER — Other Ambulatory Visit: Payer: Self-pay

## 2022-02-05 ENCOUNTER — Other Ambulatory Visit: Payer: Self-pay

## 2022-02-07 ENCOUNTER — Other Ambulatory Visit: Payer: Self-pay

## 2022-02-12 ENCOUNTER — Other Ambulatory Visit: Payer: Self-pay

## 2022-03-14 ENCOUNTER — Other Ambulatory Visit: Payer: Self-pay | Admitting: Nurse Practitioner

## 2022-03-14 ENCOUNTER — Other Ambulatory Visit (HOSPITAL_COMMUNITY): Payer: Self-pay

## 2022-03-14 DIAGNOSIS — I5022 Chronic systolic (congestive) heart failure: Secondary | ICD-10-CM

## 2022-03-14 MED ORDER — SPIRONOLACTONE 25 MG PO TABS
25.0000 mg | ORAL_TABLET | Freq: Every day | ORAL | 1 refills | Status: DC
  Filled 2022-03-14: qty 90, 90d supply, fill #0
  Filled 2022-08-08 – 2022-09-27 (×2): qty 90, 90d supply, fill #1

## 2022-03-14 NOTE — Telephone Encounter (Signed)
Requested Prescriptions  Pending Prescriptions Disp Refills   spironolactone (ALDACTONE) 25 MG tablet 90 tablet 1    Sig: Take 1 tablet (25 mg total) by mouth daily.     Cardiovascular: Diuretics - Aldosterone Antagonist Failed - 03/14/2022 11:22 AM      Failed - Cr in normal range and within 180 days    Creat  Date Value Ref Range Status  07/22/2011 0.72 0.50 - 1.10 mg/dL Final   Creatinine, Ser  Date Value Ref Range Status  01/04/2022 1.08 (H) 0.57 - 1.00 mg/dL Final         Failed - Last BP in normal range    BP Readings from Last 1 Encounters:  01/04/22 (!) 149/87         Passed - K in normal range and within 180 days    Potassium  Date Value Ref Range Status  01/04/2022 4.6 3.5 - 5.2 mmol/L Final         Passed - Na in normal range and within 180 days    Sodium  Date Value Ref Range Status  01/04/2022 135 134 - 144 mmol/L Final         Passed - eGFR is 30 or above and within 180 days    GFR calc Af Amer  Date Value Ref Range Status  02/15/2020 79 >59 mL/min/1.73 Final    Comment:    **In accordance with recommendations from the NKF-ASN Task force,**   [facility] is in the process of updating its eGFR calculation to the   2021 CKD-EPI creatinine equation that estimates kidney function   without a race variable.    GFR calc non Af Amer  Date Value Ref Range Status  02/15/2020 69 >59 mL/min/1.73 Final   eGFR  Date Value Ref Range Status  01/04/2022 59 (L) >59 mL/min/1.73 Final         Passed - Valid encounter within last 6 months    Recent Outpatient Visits           2 months ago Type 2 diabetes mellitus with other specified complication, with long-term current use of insulin [facility])   [place], Vernia Buff, NP   11 months ago Encounter for annual physical exam   [place], [place] W, NP   1 year ago Type 2 diabetes mellitus with other specified complication, with long-term  current use of insulin [facility])   [place], Vernia Buff, NP   2 years ago Encounter for Papanicolaou smear for cervical cancer screening   [place], [place], NP   2 years ago Encounter to establish care   [place] Gildardo Pounds, NP       Future Appointments             In 3 weeks Gildardo Pounds, NP [place]

## 2022-03-20 ENCOUNTER — Other Ambulatory Visit: Payer: Self-pay

## 2022-04-05 ENCOUNTER — Ambulatory Visit: Payer: Medicaid Other | Attending: Nurse Practitioner | Admitting: Nurse Practitioner

## 2022-04-05 ENCOUNTER — Other Ambulatory Visit: Payer: Self-pay

## 2022-04-05 ENCOUNTER — Encounter: Payer: Self-pay | Admitting: Nurse Practitioner

## 2022-04-05 VITALS — BP 108/72 | HR 89 | Ht <= 58 in | Wt 189.8 lb

## 2022-04-05 DIAGNOSIS — E1169 Type 2 diabetes mellitus with other specified complication: Secondary | ICD-10-CM | POA: Diagnosis not present

## 2022-04-05 DIAGNOSIS — E11311 Type 2 diabetes mellitus with unspecified diabetic retinopathy with macular edema: Secondary | ICD-10-CM | POA: Insufficient documentation

## 2022-04-05 DIAGNOSIS — Z6841 Body Mass Index (BMI) 40.0 and over, adult: Secondary | ICD-10-CM | POA: Insufficient documentation

## 2022-04-05 DIAGNOSIS — Z1231 Encounter for screening mammogram for malignant neoplasm of breast: Secondary | ICD-10-CM

## 2022-04-05 DIAGNOSIS — Z794 Long term (current) use of insulin: Secondary | ICD-10-CM

## 2022-04-05 DIAGNOSIS — I428 Other cardiomyopathies: Secondary | ICD-10-CM

## 2022-04-05 DIAGNOSIS — Z1211 Encounter for screening for malignant neoplasm of colon: Secondary | ICD-10-CM

## 2022-04-05 LAB — POCT GLYCOSYLATED HEMOGLOBIN (HGB A1C): Hemoglobin A1C: 9.2 % — AB (ref 4.0–5.6)

## 2022-04-05 MED ORDER — TRULICITY 4.5 MG/0.5ML ~~LOC~~ SOAJ
4.5000 mg | SUBCUTANEOUS | 1 refills | Status: DC
  Filled 2022-04-05: qty 6, 84d supply, fill #0
  Filled 2022-04-05 – 2022-05-03 (×2): qty 2, 28d supply, fill #0
  Filled 2022-06-27: qty 2, 28d supply, fill #1
  Filled 2022-08-08 – 2022-09-27 (×2): qty 2, 28d supply, fill #2
  Filled 2022-11-07 – 2022-11-20 (×2): qty 2, 28d supply, fill #3

## 2022-04-05 NOTE — Progress Notes (Signed)
Assessment & Plan:  Felicia [name] was seen today for diabetes.  Diagnoses and all orders for this visit:  Type 2 diabetes mellitus with other specified complication, with long-term current use of insulin (HCC) -     POCT glycosylated hemoglobin (Hb A1C) -     Dulaglutide (TRULICITY) 4.5 0000000 SOPN; Inject 4.5 mg as directed once a week. -     CMP14+EGFR -     CMP14+EGFR -     Ambulatory referral to Ophthalmology Continue blood sugar control as discussed in office today, low carbohydrate diet, and regular physical exercise as tolerated, 150 minutes per week (30 min each day, 5 days per week, or 50 min 3 days per week). Keep blood sugar logs with fasting goal of 90-130 mg/dl, post prandial (after you eat) less than 180.  For Hypoglycemia: BS <60 and Hyperglycemia BS >400; contact the clinic ASAP. Annual eye exams and foot exams are recommended.   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 Keep blood sugar logs with fasting goal of 90-130 mg/dl, post prandial (after you eat) less than 180.  For Hypoglycemia: BS <60 and Hyperglycemia BS >400; contact the clinic ASAP. Annual eye exams and foot exams are recommended.   BMI 40.0-44.9, adult (HCC) Discussed diet and exercise for person with BMI >40. Instructed: You must burn more calories than you eat. Losing 5 percent of your body weight should be considered a success. In the longer term, losing more than 15 percent of your body  weight and staying at this weight is an extremely good result. However, keep in mind that even losing 5 percent of your body weight leads to important health benefits, so try not to get discouraged if you're not able to lose more than this.   Nonischemic cardiomyopathy ([place]) -     Ambulatory referral to Cardiology    Patient has been counseled on age-appropriate routine health concerns for screening and prevention. These are reviewed and up-to-date. Referrals have been placed accordingly. Immunizations are up-to-date or declined.    Subjective:   Chief Complaint  Patient presents with   Diabetes   Diabetes Pertinent negatives for hypoglycemia include no dizziness, headaches or seizures. Pertinent negatives for diabetes include no blurred vision, no chest pain and no weight loss.   Felicia [name] 62 y.o. female presents to office today for follow up to DM and HTN  She has a past medical history of Cardiomyopathy, CHF, COPD, DDD lumbar, Diabetes mellitus, Hypertension, Morbid Obesity, Non compliance w medication regimen   DM 2 She is not checking her blood sugars daily. Getting ready to start walking since the weather has improved. Diabetes is improving however  A1c continues not at goal. Will increase Trulicity to 4.5mg  weekly. She will continue on Jardiance 10 mg daily, humulin 70/30 42 units BID. Lab Results  Component Value Date   HGBA1C 9.2 (A) 04/05/2022    Lab Results  Component Value Date   HGBA1C 12.9 (A) 01/04/2022  LDL at  goal. Lab Results  Component Value Date   LDLCALC 34 12/27/2021    HTN Blood pressure is well controlled. She endorses adherence taking carvedilol 6.25 mg twice daily, hydralazine 50 mg 3 times daily, Imdur 30 mg daily, spironolactone 25 mg daily.  It does not appear Entresto was ever picked up.  Overdue for follow-up with cardiology BP Readings from Last 3 Encounters:  04/05/22 108/72  01/04/22 (!) 149/87  08/09/21 122/80     Review of  Systems  Constitutional:  Negative for fever, malaise/fatigue and weight loss.  HENT: Negative.  Negative for nosebleeds.   Eyes: Negative.  Negative for blurred vision, double vision and photophobia.  Respiratory: Negative.  Negative for cough and shortness of breath.   Cardiovascular: Negative.  Negative for chest pain, palpitations and leg swelling.  Gastrointestinal: Negative.  Negative for heartburn, nausea and vomiting.  Musculoskeletal: Negative.  Negative for myalgias.  Neurological: Negative.  Negative for dizziness, focal weakness, seizures and headaches.  Psychiatric/Behavioral: Negative.  Negative for suicidal ideas.     Past Medical History:  Diagnosis Date   Cardiomyopathy    CHF (congestive heart failure) (HCC)    minamal   COPD (chronic obstructive pulmonary disease) (HCC)    DDD (degenerative disc disease), lumbar    Diabetes mellitus    Hypertension    Non compliance w medication regimen    Normal echocardiogram    LVEF 25-30% 03/27/2010    Past Surgical History:  Procedure Laterality Date   CARDIAC CATHETERIZATION     RIGHT/LEFT HEART CATH AND CORONARY ANGIOGRAPHY N/A 05/29/2017   Procedure: RIGHT/LEFT HEART CATH AND CORONARY ANGIOGRAPHY;  Surgeon: Jolaine Artist, MD;  Location: [facility];  Service: Cardiovascular;  Laterality: N/A;   TUBAL LIGATION      Family History  Problem Relation Age of Onset   Stroke Mother    Lung cancer Father    Diabetes Daughter     Social History Reviewed with no changes to be made today.   Outpatient Medications Prior to Visit  Medication Sig Dispense Refill   Accu-Chek Softclix Lancets lancets Use as directed to test blood sugars three times a day. 100 each 11   acetaminophen (TYLENOL) 500 MG tablet Take 1,500 mg by mouth daily as needed for mild pain or headache.     albuterol (PROVENTIL) (2.5 MG/3ML) 0.083% nebulizer solution Take 3 mLs (2.5 mg total) by nebulization every 6 (six) hours as needed for  wheezing or shortness of breath. 150 mL 1   Blood Glucose Monitoring Suppl (ACCU-CHEK GUIDE) w/Device KIT use as directed 1 kit 0   carvedilol (COREG) 6.25 MG tablet Take 1 tablet (6.25 mg total) by mouth 2 (two) times daily with a meal. 180 tablet 1   diclofenac Sodium (VOLTAREN) 1 % GEL Apply 4 g topically 4 (four) times daily. 200 g 1   empagliflozin (JARDIANCE) 10 MG TABS tablet Take 1 tablet (10 mg total) by mouth daily before breakfast. 90 tablet 3   ezetimibe (ZETIA) 10 MG tablet Take 1 tablet (10 mg total) by mouth daily. 90 tablet 3   furosemide (LASIX) 20 MG tablet Take 1 tablet (20 mg total) by mouth daily. 90 tablet 1   glucose blood (TRUE METRIX BLOOD GLUCOSE TEST) test strip Use to check blood sugars up to 3 times per day 100 each 12   hydrALAZINE (APRESOLINE) 50 MG tablet Take 1 tablet (50 mg total) by mouth 3 (three) times daily. 270 tablet 1   insulin  NPH-regular Human (HUMULIN 70/30) (70-30) 100 UNIT/ML injection Inject 42 Units into the skin 2 (two) times daily with a meal. 80 mL 1   Insulin Syringe-Needle U-100 30G X 5/16" 0.5 ML MISC use as directed, to inject into the skin twice a day 200 each 6   isosorbide mononitrate (IMDUR) 30 MG 24 hr tablet Take 1 tablet (30 mg total) by mouth daily. 30 tablet 1   metroNIDAZOLE (FLAGYL) 500 MG tablet Take 1 tablet (500 mg total) by mouth 2 (two) times daily. 14 tablet 0   Multiple Vitamin (MULTIVITAMIN WITH MINERALS) TABS tablet Take 1 tablet by mouth once a week.      rosuvastatin (CRESTOR) 10 MG tablet TAKE 1 TABLET (10 MG TOTAL) BY MOUTH DAILY. 30 tablet 6   spironolactone (ALDACTONE) 25 MG tablet Take 1 tablet (25 mg total) by mouth daily. 90 tablet 1   triamcinolone (KENALOG) 0.025 % ointment Apply 1 Application topically 2 (two) times daily. 60 g 0   VENTOLIN HFA 108 (90 Base) MCG/ACT inhaler Inhale 2 puffs into the lungs every 6 (six) hours as needed for wheezing or shortness of breath. 18 g 2   Dulaglutide (TRULICITY) 3 0000000  SOPN Inject 3 mg as directed once a week. 2 mL 0   fluconazole (DIFLUCAN) 150 MG tablet Take 1 tablet (150 mg total) by mouth daily. 1 tablet 1   Blood Pressure Monitor DEVI Please provide patient with insurance approved blood pressure monitor I10.0 (Patient not taking: Reported on 04/05/2022) 1 each 0   sacubitril-valsartan (ENTRESTO) 97-103 MG Take 1 tablet by mouth 2 (two) times daily. (Patient not taking: Reported on 04/05/2022) 60 tablet 11   No facility-administered medications prior to visit.    Allergies  Allergen Reactions   Banana Itching   Latex Itching       Objective:    BP 108/72   Pulse 89   Ht 4' 9.5" (1.461 m)   Wt 189 lb 12.8 oz (86.1 kg)   LMP 08/02/2010   SpO2 94%   BMI 40.36 kg/m  Wt Readings from Last 3 Encounters:  04/05/22 189 lb 12.8 oz (86.1 kg)  01/04/22 191 lb 3.2 oz (86.7 kg)  01/02/22 190 lb (86.2 kg)    Physical Exam Vitals and nursing note reviewed.  Constitutional:      Appearance: She is well-developed.  HENT:     Head: Normocephalic and atraumatic.  Cardiovascular:     Rate and Rhythm: Normal rate and regular rhythm.     Heart sounds: Normal heart sounds. No murmur heard.    No friction rub. No gallop.  Pulmonary:     Effort: Pulmonary effort is normal. No tachypnea or respiratory distress.     Breath sounds: Normal breath sounds. No decreased breath sounds, wheezing, rhonchi or rales.  Chest:     Chest wall: No tenderness.  Abdominal:     General: Bowel sounds are normal.     Palpations: Abdomen is soft.  Musculoskeletal:        General: Normal range of motion.     Cervical back: Normal range of motion.  Skin:    General: Skin is warm and dry.  Neurological:     Mental Status: She is alert and oriented to person, place, and time.     Coordination: Coordination normal.  Psychiatric:        Behavior: Behavior normal. Behavior is cooperative.        Thought Content: Thought content normal.  Judgment: Judgment normal.           Patient has been counseled extensively about nutrition and exercise as well as the importance of adherence with medications and regular follow-up. The patient was given clear instructions to go to ER or return to medical center if symptoms don't improve, worsen or new problems develop. The patient verbalized understanding.   Follow-up: Return for labs 4 weeks. see me in 3 months.Gildardo Pounds, FNP-BC [facility] [place], [place]   04/05/2022, 1:06 PM

## 2022-04-08 NOTE — Progress Notes (Signed)
Message left to return for labs.

## 2022-04-11 NOTE — Progress Notes (Signed)
Unable to reach patient by phone 

## 2022-04-12 ENCOUNTER — Other Ambulatory Visit: Payer: Self-pay

## 2022-05-03 ENCOUNTER — Other Ambulatory Visit: Payer: Self-pay

## 2022-05-03 ENCOUNTER — Other Ambulatory Visit: Payer: Medicaid Other

## 2022-05-03 ENCOUNTER — Other Ambulatory Visit: Payer: Self-pay | Admitting: Nurse Practitioner

## 2022-05-03 DIAGNOSIS — E785 Hyperlipidemia, unspecified: Secondary | ICD-10-CM

## 2022-05-03 MED ORDER — ROSUVASTATIN CALCIUM 10 MG PO TABS
ORAL_TABLET | Freq: Every day | ORAL | 0 refills | Status: DC
  Filled 2022-05-03: qty 90, 90d supply, fill #0

## 2022-05-09 ENCOUNTER — Other Ambulatory Visit: Payer: Self-pay

## 2022-05-15 ENCOUNTER — Other Ambulatory Visit: Payer: Self-pay | Admitting: Nurse Practitioner

## 2022-05-15 ENCOUNTER — Other Ambulatory Visit: Payer: Self-pay

## 2022-05-15 DIAGNOSIS — J449 Chronic obstructive pulmonary disease, unspecified: Secondary | ICD-10-CM

## 2022-05-15 MED ORDER — ALBUTEROL SULFATE (2.5 MG/3ML) 0.083% IN NEBU
2.5000 mg | INHALATION_SOLUTION | Freq: Four times a day (QID) | RESPIRATORY_TRACT | 1 refills | Status: DC | PRN
  Filled 2022-05-15 – 2023-04-17 (×2): qty 180, 15d supply, fill #0

## 2022-05-16 ENCOUNTER — Other Ambulatory Visit: Payer: Self-pay | Admitting: Nurse Practitioner

## 2022-05-16 ENCOUNTER — Other Ambulatory Visit: Payer: Self-pay

## 2022-05-16 DIAGNOSIS — J449 Chronic obstructive pulmonary disease, unspecified: Secondary | ICD-10-CM

## 2022-05-16 MED ORDER — VENTOLIN HFA 108 (90 BASE) MCG/ACT IN AERS
2.0000 | INHALATION_SPRAY | Freq: Four times a day (QID) | RESPIRATORY_TRACT | 2 refills | Status: AC | PRN
Start: 2022-05-16 — End: ?
  Filled 2022-05-16 – 2023-04-17 (×2): qty 18, 25d supply, fill #0

## 2022-05-16 NOTE — Telephone Encounter (Signed)
Requested Prescriptions  Pending Prescriptions Disp Refills   VENTOLIN HFA 108 (90 Base) MCG/ACT inhaler 18 g 2    Sig: Inhale 2 puffs into the lungs every 6 (six) hours as needed for wheezing or shortness of breath.     Pulmonology:  Beta Agonists 2 Passed - 05/16/2022  2:50 PM      Passed - Last BP in normal range    BP Readings from Last 1 Encounters:  04/05/22 108/72         Passed - Last Heart Rate in normal range    Pulse Readings from Last 1 Encounters:  04/05/22 89         Passed - Valid encounter within last 12 months    Recent Outpatient Visits           1 month ago Type 2 diabetes mellitus with other specified complication, with long-term current use of insulin (HCC)   [facility] [place], [place] W, NP   4 months ago Type 2 diabetes mellitus with other specified complication, with long-term current use of insulin [facility])   [place] [facility] [place], Shea Stakes, NP   1 year ago Encounter for annual physical exam   [place] [facility] [place], [place] W, NP   1 year ago Type 2 diabetes mellitus with other specified complication, with long-term current use of insulin [facility])   [place] [facility] [place], Shea Stakes, NP   2 years ago Encounter for Papanicolaou smear for cervical cancer screening   [facility] Claiborne Rigg, NP       Future Appointments             In 1 month Claiborne Rigg, NP [facility]

## 2022-05-22 ENCOUNTER — Other Ambulatory Visit: Payer: Self-pay

## 2022-06-27 ENCOUNTER — Other Ambulatory Visit: Payer: Self-pay

## 2022-06-28 ENCOUNTER — Other Ambulatory Visit: Payer: Self-pay

## 2022-07-03 ENCOUNTER — Other Ambulatory Visit: Payer: Self-pay

## 2022-07-04 ENCOUNTER — Other Ambulatory Visit: Payer: Self-pay

## 2022-07-05 ENCOUNTER — Other Ambulatory Visit: Payer: Self-pay

## 2022-07-12 ENCOUNTER — Ambulatory Visit: Payer: Medicaid Other | Admitting: Nurse Practitioner

## 2022-08-14 ENCOUNTER — Other Ambulatory Visit: Payer: Self-pay

## 2022-08-20 ENCOUNTER — Other Ambulatory Visit (HOSPITAL_COMMUNITY): Payer: Self-pay

## 2022-09-18 ENCOUNTER — Other Ambulatory Visit: Payer: Self-pay

## 2022-09-18 ENCOUNTER — Other Ambulatory Visit: Payer: Self-pay | Admitting: Family Medicine

## 2022-09-18 DIAGNOSIS — E785 Hyperlipidemia, unspecified: Secondary | ICD-10-CM

## 2022-09-18 MED ORDER — ROSUVASTATIN CALCIUM 10 MG PO TABS
ORAL_TABLET | Freq: Every day | ORAL | 0 refills | Status: DC
Start: 2022-09-18 — End: 2022-11-07
  Filled 2022-09-18 – 2022-09-27 (×2): qty 30, 30d supply, fill #0

## 2022-09-19 ENCOUNTER — Other Ambulatory Visit: Payer: Self-pay

## 2022-09-26 ENCOUNTER — Other Ambulatory Visit: Payer: Self-pay

## 2022-09-27 ENCOUNTER — Other Ambulatory Visit: Payer: Self-pay

## 2022-11-07 ENCOUNTER — Other Ambulatory Visit: Payer: Self-pay

## 2022-11-07 ENCOUNTER — Other Ambulatory Visit: Payer: Self-pay | Admitting: Family Medicine

## 2022-11-07 DIAGNOSIS — E785 Hyperlipidemia, unspecified: Secondary | ICD-10-CM

## 2022-11-07 MED ORDER — ROSUVASTATIN CALCIUM 10 MG PO TABS
10.0000 mg | ORAL_TABLET | Freq: Every day | ORAL | 0 refills | Status: DC
Start: 2022-11-07 — End: 2022-12-17
  Filled 2022-11-07 – 2022-11-20 (×2): qty 90, 90d supply, fill #0

## 2022-11-07 NOTE — Telephone Encounter (Signed)
Patient will need to schedule an office visit for further refills. Requested Prescriptions  Pending Prescriptions Disp Refills   rosuvastatin (CRESTOR) 10 MG tablet 90 tablet 0    Sig: TAKE 1 TABLET (10 MG TOTAL) BY MOUTH DAILY.     Cardiovascular:  Antilipid - Statins 2 Failed - 11/07/2022  3:14 PM      Failed - Cr in normal range and within 360 days    Creat  Date Value Ref Range Status  07/22/2011 0.72 0.50 - 1.10 mg/dL Final   Creatinine, Ser  Date Value Ref Range Status  01/04/2022 1.08 (H) 0.57 - 1.00 mg/dL Final         Failed - Lipid Panel in normal range within the last 12 months    Cholesterol, Total  Date Value Ref Range Status  12/27/2021 97 (L) 100 - 199 mg/dL Final   LDL Chol Calc (NIH)  Date Value Ref Range Status  12/27/2021 34 0 - 99 mg/dL Final   HDL  Date Value Ref Range Status  12/27/2021 43 >39 mg/dL Final   Triglycerides  Date Value Ref Range Status  12/27/2021 109 0 - 149 mg/dL Final         Passed - Patient is not pregnant      Passed - Valid encounter within last 12 months    Recent Outpatient Visits           7 months ago Type 2 diabetes mellitus with other specified complication, with long-term current use of insulin (HCC)   [place] [facility] [place], [place] W, NP   10 months ago Type 2 diabetes mellitus with other specified complication, with long-term current use of insulin [facility])   [facility] [place], Shea Stakes, NP   1 year ago Encounter for annual physical exam    [facility] [place], [place] W, NP   2 years ago Type 2 diabetes mellitus with other specified complication, with long-term current use of insulin [facility])    [facility] [place], Shea Stakes, NP   2 years ago Encounter for Papanicolaou smear for cervical cancer screening   [facility] [place], Shea Stakes, NP

## 2022-11-13 NOTE — Progress Notes (Deleted)
Cardiology Office Note:   Date:  11/14/2022  ID:  Felicia [name], DOB 07-07-1960, MRN [id number] PCP:  Claiborne Rigg, NP  [facility] Providers Cardiologist:  Alverda Skeans, MD Referring MD: Claiborne Rigg, NP  Chief Complaint/Reason for Referral:  Cardiology follow up ASSESSMENT:    PATIENT DID NOT APPEAR FOR APPOINTMENT   1. Nonischemic cardiomyopathy (HCC)   2. Type 2 diabetes mellitus with complication, with long-term current use of insulin (HCC)   3. Hypertension associated with diabetes (HCC)   4. Hyperlipidemia associated with type 2 diabetes mellitus (HCC)   5. Stage 3a chronic kidney disease (HCC)   6. Mild CAD   7. BMI 40.0-44.9, adult (HCC)   8. Claudication in peripheral vascular disease (HCC)   9. Tobacco abuse     PLAN:   In order of problems listed above:  PATIENT DID NOT APPEAR FOR APPOINTMENT   Nonischemic cardiomyopathy: Last echocardiogram demonstrated dated recovered LV function.  Continue Jardiance, Imdur, hydralazine, Entresto, Coreg and spironolactone. Type 2 diabetes: Start aspirin 81 mg daily, continue Jardiance, Crestor, and Entresto. Hypertension:  Hyperlipidemia: Check lipid panel, LFTs, and LP(a) today. Stage III chronic kidney disease: Continue Entresto and Jardiance for renal protection. Mild coronary artery disease: Start aspirin and continue statin. Elevated BMI: Refer to pharmacy for recommendations regarding GLP-1 receptor agonist therapy. Claudication: Continue to control cardiovascular risk factors; the patient follows with vascular surgery.  PATIENT DID NOT APPEAR FOR APPOINTMENT         {Are you ordering a CV Procedure (e.g. stress test, cath, DCCV, TEE, etc)?   Press F2        :[id number]}   Dispo:  No follow-ups on file.      Medication Adjustments/Labs and Tests Ordered: Current medicines are reviewed at length with the patient today.  Concerns regarding medicines are outlined above.  The following changes  have been made:  {PLAN; NO CHANGE:13088:s}   Labs/tests ordered: No orders of the defined types were placed in this encounter.   Medication Changes: No orders of the defined types were placed in this encounter.   Current medicines are reviewed at length with the patient today.  The patient {ACTIONS; HAS/DOES NOT HAVE:19233} concerns regarding medicines.  I spent *** minutes reviewing all clinical data during and prior to this visit including all relevant imaging studies, laboratories, clinical information from other health systems, and prior notes from both Cardiology and other specialties, interviewing the patient, and conducting a complete physical examination in order to formulate a comprehensive and personalized evaluation and treatment plan.  History of Present Illness:      FOCUSED PROBLEM LIST:   Nonischemic cardiomyopathy TTE 2019 ejection fraction 25 to 30% Coronary angiography 2019 minimal nonobstructive disease TTE 2023 EF 55-60% on medical therapy Type 2 diabetes on insulin Hypertension Hyperlipidemia Tobacco abuse with COPD Peripheral vascular disease with stable claudication ABI 2022 right 0.54 left 0.72 Followed by vascular surgery BMI 42 CKD stage III  October 2024: The patient is 62 year old female with the above listed medical problems referred to general cardiology clinic.  She had previously been followed in the advanced heart failure clinic for nonischemic cardiomyopathy seen on echocardiography a few years ago.  She was started on medical therapy with normalization of her ejection fraction.  She did undergo coronary angiography which demonstrated minimal nonobstructive disease in 2019.          Current Medications: No outpatient medications have been marked as taking for the 11/14/22 encounter (Appointment) with  [name], MD.     Review of Systems:   Please see the history of present illness.    All other systems reviewed and are negative.      EKGs/Labs/Other Test Reviewed:   EKG: EKG performed July 2023 demonstrated normal sinus rhythm and nonspecific ST and T wave changes.  EKG Interpretation Date/Time:    Ventricular Rate:    PR Interval:    QRS Duration:    QT Interval:    QTC Calculation:   R Axis:      Text Interpretation:           Risk Assessment/Calculations:   {Does this patient have ATRIAL FIBRILLATION?[phone]}      Physical Exam:   VS:  LMP 08/02/2010    No BP recorded.  {Refresh Note OR Click here to enter BP  :1}***   Wt Readings from Last 3 Encounters:  04/05/22 189 lb 12.8 oz (86.1 kg)  01/04/22 191 lb 3.2 oz (86.7 kg)  01/02/22 190 lb (86.2 kg)      GENERAL:  No apparent distress, AOx3 HEENT:  No carotid bruits, +2 carotid impulses, no scleral icterus CAR: RRR Irregular RR*** no murmurs***, gallops, rubs, or thrills RES:  Clear to auscultation bilaterally ABD:  Soft, nontender, nondistended, positive bowel sounds x 4 VASC:  +2 radial pulses, +2 carotid pulses NEURO:  CN 2-12 grossly intact; motor and sensory grossly intact PSYCH:  No active depression or anxiety EXT:  No edema, ecchymosis, or cyanosis  Signed, [name], MD  11/14/2022 10:02 AM    [facility] [address] [place], [place], [place]  [address] Phone: [phone]; Fax: [phone]   Note:  This document was prepared using Dragon voice recognition software and may include unintentional dictation errors.

## 2022-11-14 ENCOUNTER — Ambulatory Visit: Payer: Medicaid Other | Attending: Internal Medicine | Admitting: Internal Medicine

## 2022-11-14 DIAGNOSIS — E785 Hyperlipidemia, unspecified: Secondary | ICD-10-CM

## 2022-11-14 DIAGNOSIS — I2510 Atherosclerotic heart disease of native coronary artery without angina pectoris: Secondary | ICD-10-CM

## 2022-11-14 DIAGNOSIS — I739 Peripheral vascular disease, unspecified: Secondary | ICD-10-CM

## 2022-11-14 DIAGNOSIS — E1159 Type 2 diabetes mellitus with other circulatory complications: Secondary | ICD-10-CM

## 2022-11-14 DIAGNOSIS — I428 Other cardiomyopathies: Secondary | ICD-10-CM

## 2022-11-14 DIAGNOSIS — Z6841 Body Mass Index (BMI) 40.0 and over, adult: Secondary | ICD-10-CM

## 2022-11-14 DIAGNOSIS — Z794 Long term (current) use of insulin: Secondary | ICD-10-CM

## 2022-11-14 DIAGNOSIS — N1831 Chronic kidney disease, stage 3a: Secondary | ICD-10-CM

## 2022-11-14 DIAGNOSIS — Z720 Tobacco use: Secondary | ICD-10-CM

## 2022-11-15 ENCOUNTER — Encounter: Payer: Self-pay | Admitting: Internal Medicine

## 2022-11-19 ENCOUNTER — Other Ambulatory Visit: Payer: Self-pay

## 2022-11-20 ENCOUNTER — Other Ambulatory Visit: Payer: Self-pay

## 2022-11-21 ENCOUNTER — Encounter: Payer: Self-pay | Admitting: Internal Medicine

## 2022-11-22 ENCOUNTER — Other Ambulatory Visit: Payer: Self-pay

## 2022-12-01 NOTE — Progress Notes (Unsigned)
Cardiology Office Note    Patient Name: Felicia [name] Date of Encounter: 12/01/2022  Primary Care Provider:  Claiborne Rigg, NP Primary Cardiologist:  [name], MD Primary Electrophysiologist: None   Past Medical History    Past Medical History:  Diagnosis Date   Cardiomyopathy    CHF (congestive heart failure) (HCC)    minamal   COPD (chronic obstructive pulmonary disease) (HCC)    DDD (degenerative disc disease), lumbar    Diabetes mellitus    Hypertension    Non compliance w medication regimen    Normal echocardiogram    LVEF 25-30% 03/27/2010    History of Present Illness  Felicia [name] is a 62 y.o. female with a PMH of mild CAD, HFrEF, COPD, IDDM, HTN, NICM, tobacco abuse who presents today for 1 year follow-up.  Felicia [name] was seen initially in 2012 by Dr. Diona Browner and is currently followed by Dr. Lynnette Caffey. She underwent a 2D echo that showed EF of 20-25% and left heart cath was completed that revealed no significant CAD.  She was treated with GDMT and EF improved to 55% and patient was lost to follow-up until being seen in 2019 for evaluation of acute on chronic systolic CHF.  She underwent a R/LHC that showed well compensated hemodynamics with minimal nonobstructive CAD.  Echo was completed showing EF of 25-30% with trivial MR, mild TR PASP of 40 mmHg elevated CVP.  She was started on GDMT with  and carvedilol was switched to [facility].  2D echo was completed 09/2019 showing EF of 65% and patient was continued on current GDMT.  She was seen on 08/09/2021 by  Dr. Lynnette Caffey to establish care for management of CHF.  She was continuing to do well with no interval emergency visits and patient was started on Jardiance to maximize GDMT.  He was noted to have elevated LP(a) and was referred to the clinical pharmacist for recommendations.  Echo was repeated on 08/22/2021 that showed EF of 55 to 60% with no RWMA and moderate concentric LVH trivial MR  During today's visit  the patient reports with leg pain in the right calf, particularly when walking long distances.  The pain has been worsening over the past six months. The patient also reports numbness in the toes of the right foot, which has been worsening over the past six months. The patient is a current smoker and has been trying to quit.  Her blood pressure today was controlled at 126/86 and heart rate was 93 bpm.  She was previously on Entresto for heart failure but stopped taking it due to not renewing their assistance program in time. The patient reports no issues with their current medications, including Jardiance for heart failure and ezetimibe and Crestor for high cholesterol.  Patient denies chest pain, palpitations, dyspnea, PND, orthopnea, nausea, vomiting, dizziness, syncope, edema, weight gain, or early satiety.  Review of Systems  Please see the history of present illness.    All other systems reviewed and are otherwise negative except as noted above.  Physical Exam    Wt Readings from Last 3 Encounters:  04/05/22 189 lb 12.8 oz (86.1 kg)  01/04/22 191 lb 3.2 oz (86.7 kg)  01/02/22 190 lb (86.2 kg)   MW:UXLKG were no vitals filed for this visit.,There is no height or weight on file to calculate BMI. GEN: Well nourished, well developed in no acute distress Neck: No JVD; No carotid bruits Pulmonary: Clear to auscultation without rales, wheezing or rhonchi  Cardiovascular: Normal rate. Regular rhythm. Normal S1. Normal S2.   Murmurs: There is no murmur.  ABDOMEN: Soft, non-tender, non-distended EXTREMITIES:  No edema; No deformity   EKG/LABS/ Recent Cardiac Studies   ECG personally reviewed by me today -sinus rhythm with LVH and rate of 93 bpm with possible repolarization abnormality and no acute changes consistent with previous EKG.  Risk Assessment/Calculations:          Lab Results  Component Value Date   WBC 9.3 04/09/2021   HGB 15.4 04/09/2021   HCT 46.3 04/09/2021   MCV 90  04/09/2021   PLT 247 04/09/2021   Lab Results  Component Value Date   CREATININE 1.08 (H) 01/04/2022   BUN 14 01/04/2022   NA 135 01/04/2022   K 4.6 01/04/2022   CL 95 (L) 01/04/2022   CO2 22 01/04/2022   Lab Results  Component Value Date   CHOL 97 (L) 12/27/2021   HDL 43 12/27/2021   LDLCALC 34 12/27/2021   TRIG 109 12/27/2021   CHOLHDL 2.3 12/27/2021    Lab Results  Component Value Date   HGBA1C 9.2 (A) 04/05/2022   Assessment & Plan    1.HFimEF/NICM: -Most recent 2D echo completed 08/2021 with EF of 55 to 60% with no RWMA and moderate concentric LVH trivial MR -Today patient is euvolemic on examination. -Continue carvedilol 6.25 mg twice daily, Jardiance 10 mg daily, hydralazine 50 mg 3 times daily, Imdur 30 mg daily, spironolactone 25 mg daily Lasix 20 mg daily  Entresto was discontinued due to patient assistance program lapse. -Order BMP and CBC today to check kidney function. -Resume Entresto at 49/51mg  dose. -Check labs again in 2 weeks.  2.  Essential hypertension: -Patient's blood pressure was stable at 126/86 -Continue current medications.  3.  DM type II -Last A1C was 9.2, indicating poor control. Patient reports struggling with diet and exercise. -Continue current medications including Trulicity and insulin. -Discuss potential addition of Ozempic at next visit.  4.  Tobacco abuse -Patient currently smoking 5 cigarettes per day and working on cessation. -Encourage continued efforts to quit smoking.  5.  Nonobstructive CAD -Patient reports no chest pain or angina since previous follow-up. -Continue GDMT with carvedilol 6.25 mg twice daily, ezetimibe 10 mg daily, Crestor 10 mg daily  6. Peripheral Artery Disease Reports of worsening leg pain and numbness in right toes over the past 6 months, suggestive of claudication. History of moderate artery disease in both legs. -Order Ankle-Brachial Index (ABI) testing and lower extremity arterial duplex -Refer  back to Dr. Randie Heinz for vascular consultation.   Disposition: Follow-up with [name], MD or APP in 3 months    Signed, Napoleon Form, Leodis Rains, NP 12/01/2022, 4:10 PM [facility]

## 2022-12-02 ENCOUNTER — Ambulatory Visit: Payer: Medicaid Other | Attending: Nurse Practitioner | Admitting: Nurse Practitioner

## 2022-12-02 ENCOUNTER — Other Ambulatory Visit: Payer: Self-pay

## 2022-12-02 ENCOUNTER — Encounter: Payer: Self-pay | Admitting: Nurse Practitioner

## 2022-12-02 VITALS — BP 126/86 | HR 93 | Resp 16 | Ht <= 58 in | Wt 187.2 lb

## 2022-12-02 DIAGNOSIS — I2510 Atherosclerotic heart disease of native coronary artery without angina pectoris: Secondary | ICD-10-CM | POA: Diagnosis not present

## 2022-12-02 DIAGNOSIS — I5032 Chronic diastolic (congestive) heart failure: Secondary | ICD-10-CM | POA: Diagnosis not present

## 2022-12-02 DIAGNOSIS — I428 Other cardiomyopathies: Secondary | ICD-10-CM

## 2022-12-02 DIAGNOSIS — E11311 Type 2 diabetes mellitus with unspecified diabetic retinopathy with macular edema: Secondary | ICD-10-CM | POA: Diagnosis not present

## 2022-12-02 DIAGNOSIS — I152 Hypertension secondary to endocrine disorders: Secondary | ICD-10-CM

## 2022-12-02 DIAGNOSIS — E1159 Type 2 diabetes mellitus with other circulatory complications: Secondary | ICD-10-CM

## 2022-12-02 DIAGNOSIS — Z794 Long term (current) use of insulin: Secondary | ICD-10-CM

## 2022-12-02 MED ORDER — ENTRESTO 49-51 MG PO TABS
1.0000 | ORAL_TABLET | Freq: Two times a day (BID) | ORAL | 3 refills | Status: DC
  Filled 2022-12-02: qty 60, 30d supply, fill #0
  Filled 2023-02-06: qty 60, 30d supply, fill #1
  Filled 2023-03-27 (×2): qty 60, 30d supply, fill #2
  Filled 2023-05-07: qty 60, 30d supply, fill #3

## 2022-12-02 NOTE — Patient Instructions (Signed)
Medication Instructions:  RESTART Entresto 49/51mg  take 1 tablet twice a day  *If you need a refill on your cardiac medications before your next appointment, please call your pharmacy*   Lab Work: TODAY-BMET, CBC 2 WEEKS BMET  If you have labs (blood work) drawn today and your tests are completely normal, you will receive your results only by: MyChart Message (if you have MyChart) OR A paper copy in the mail If you have any lab test that is abnormal or we need to change your treatment, we will call you to review the results.   Testing/Procedures: Your physician has requested that you have a lower extremity arterial exercise duplex. During this test, exercise and ultrasound are used to evaluate arterial blood flow in the legs. Allow one hour for this exam. There are no restrictions or special instructions.  Your physician has requested that you have an ankle brachial index (ABI). During this test an ultrasound and blood pressure cuff are used to evaluate the arteries that supply the arms and legs with blood. Allow thirty minutes for this exam. There are no restrictions or special instructions.  Please note: We ask at that you not bring children with you during ultrasound (echo/ vascular) testing. Due to room size and safety concerns, children are not allowed in the ultrasound rooms during exams. Our front office staff cannot provide observation of children in our lobby area while testing is being conducted. An adult accompanying a patient to their appointment will only be allowed in the ultrasound room at the discretion of the ultrasound technician under special circumstances. We apologize for any inconvenience.   Follow-Up: At [facility], you and your health needs are our priority.  As part of our continuing mission to provide you with exceptional heart care, we have created designated Provider Care Teams.  These Care Teams include your primary Cardiologist (physician) and Advanced  Practice Providers (APPs -  Physician Assistants and Nurse Practitioners) who all work together to provide you with the care you need, when you need it.  We recommend signing up for the patient portal called "MyChart".  Sign up information is provided on this After Visit Summary.  MyChart is used to connect with patients for Virtual Visits (Telemedicine).  Patients are able to view lab/test results, encounter notes, upcoming appointments, etc.  Non-urgent messages can be sent to your provider as well.   To learn more about what you can do with MyChart, go to [email or web address].    Your next appointment:   3 month(s)  Provider:   [name], MD  or Robin Searing, NP   Other Instructions:  You have been referred to Dr Randie Heinz at [facility]

## 2022-12-03 LAB — BASIC METABOLIC PANEL
BUN/Creatinine Ratio: 14 (ref 12–28)
BUN: 13 mg/dL (ref 8–27)
CO2: 26 mmol/L (ref 20–29)
Calcium: 9.9 mg/dL (ref 8.7–10.3)
Chloride: 100 mmol/L (ref 96–106)
Creatinine, Ser: 0.94 mg/dL (ref 0.57–1.00)
Glucose: 86 mg/dL (ref 70–99)
Potassium: 4.7 mmol/L (ref 3.5–5.2)
Sodium: 143 mmol/L (ref 134–144)
eGFR: 69 mL/min/{1.73_m2} (ref >59)

## 2022-12-03 LAB — CBC
Hematocrit: 49.1 % — ABNORMAL HIGH (ref 34.0–46.6)
Hemoglobin: 16.2 g/dL — ABNORMAL HIGH (ref 11.1–15.9)
MCH: 30.7 pg (ref 26.6–33.0)
MCHC: 33 g/dL (ref 31.5–35.7)
MCV: 93 fL (ref 79–97)
Platelets: 295 10*3/uL (ref 150–450)
RBC: 5.28 x10E6/uL (ref 3.77–5.28)
RDW: 13.5 % (ref 11.7–15.4)
WBC: 12.5 10*3/uL — ABNORMAL HIGH (ref 3.4–10.8)

## 2022-12-09 ENCOUNTER — Other Ambulatory Visit: Payer: Self-pay

## 2022-12-10 ENCOUNTER — Telehealth: Payer: Self-pay

## 2022-12-10 ENCOUNTER — Other Ambulatory Visit: Payer: Self-pay | Admitting: *Deleted

## 2022-12-10 DIAGNOSIS — M79604 Pain in right leg: Secondary | ICD-10-CM

## 2022-12-10 NOTE — Telephone Encounter (Signed)
Returned pt's call-she has c/o R foot pain/numbness when walking. She states this has been going on for about 6 months. She has been made an appt with ABIs for next month.

## 2022-12-17 ENCOUNTER — Other Ambulatory Visit: Payer: Self-pay

## 2022-12-17 ENCOUNTER — Ambulatory Visit: Payer: Medicaid Other | Attending: Nurse Practitioner | Admitting: Nurse Practitioner

## 2022-12-17 ENCOUNTER — Encounter: Payer: Self-pay | Admitting: Nurse Practitioner

## 2022-12-17 VITALS — BP 116/75 | HR 81 | Ht <= 58 in | Wt 190.0 lb

## 2022-12-17 DIAGNOSIS — J449 Chronic obstructive pulmonary disease, unspecified: Secondary | ICD-10-CM

## 2022-12-17 DIAGNOSIS — I5022 Chronic systolic (congestive) heart failure: Secondary | ICD-10-CM

## 2022-12-17 DIAGNOSIS — L309 Dermatitis, unspecified: Secondary | ICD-10-CM | POA: Diagnosis not present

## 2022-12-17 DIAGNOSIS — I70213 Atherosclerosis of native arteries of extremities with intermittent claudication, bilateral legs: Secondary | ICD-10-CM | POA: Diagnosis not present

## 2022-12-17 DIAGNOSIS — M2550 Pain in unspecified joint: Secondary | ICD-10-CM | POA: Diagnosis not present

## 2022-12-17 DIAGNOSIS — Z1211 Encounter for screening for malignant neoplasm of colon: Secondary | ICD-10-CM | POA: Diagnosis not present

## 2022-12-17 DIAGNOSIS — D72829 Elevated white blood cell count, unspecified: Secondary | ICD-10-CM | POA: Diagnosis not present

## 2022-12-17 DIAGNOSIS — Z794 Long term (current) use of insulin: Secondary | ICD-10-CM | POA: Diagnosis not present

## 2022-12-17 DIAGNOSIS — I10 Essential (primary) hypertension: Secondary | ICD-10-CM | POA: Diagnosis not present

## 2022-12-17 DIAGNOSIS — Z7984 Long term (current) use of oral hypoglycemic drugs: Secondary | ICD-10-CM

## 2022-12-17 DIAGNOSIS — E119 Type 2 diabetes mellitus without complications: Secondary | ICD-10-CM

## 2022-12-17 DIAGNOSIS — E11311 Type 2 diabetes mellitus with unspecified diabetic retinopathy with macular edema: Secondary | ICD-10-CM | POA: Diagnosis not present

## 2022-12-17 LAB — POCT GLYCOSYLATED HEMOGLOBIN (HGB A1C): Hemoglobin A1C: 7.1 % — AB (ref 4.0–5.6)

## 2022-12-17 MED ORDER — SPIRONOLACTONE 25 MG PO TABS
25.0000 mg | ORAL_TABLET | Freq: Every day | ORAL | 1 refills | Status: DC
  Filled 2022-12-17: qty 90, 90d supply, fill #0
  Filled 2023-03-27 (×2): qty 90, 90d supply, fill #1

## 2022-12-17 MED ORDER — TRIAMCINOLONE ACETONIDE 0.025 % EX OINT
1.0000 | TOPICAL_OINTMENT | Freq: Two times a day (BID) | CUTANEOUS | 1 refills | Status: DC
  Filled 2022-12-17: qty 60, 30d supply, fill #0
  Filled 2023-02-27: qty 60, 30d supply, fill #1

## 2022-12-17 MED ORDER — TRULICITY 4.5 MG/0.5ML ~~LOC~~ SOAJ
4.5000 mg | SUBCUTANEOUS | 1 refills | Status: DC
  Filled 2022-12-17: qty 2, 28d supply, fill #0
  Filled 2023-01-30: qty 2, 28d supply, fill #1
  Filled 2023-02-27: qty 2, 28d supply, fill #2
  Filled 2023-03-27 (×2): qty 2, 28d supply, fill #3
  Filled 2023-05-07: qty 2, 28d supply, fill #4
  Filled 2023-06-10: qty 2, 28d supply, fill #5

## 2022-12-17 MED ORDER — CARVEDILOL 6.25 MG PO TABS
6.2500 mg | ORAL_TABLET | Freq: Two times a day (BID) | ORAL | 1 refills | Status: DC
  Filled 2022-12-17: qty 180, 90d supply, fill #0
  Filled 2023-03-27 (×2): qty 180, 90d supply, fill #1

## 2022-12-17 MED ORDER — ROSUVASTATIN CALCIUM 10 MG PO TABS
10.0000 mg | ORAL_TABLET | Freq: Every day | ORAL | 3 refills | Status: AC
  Filled 2022-12-17 – 2023-02-27 (×2): qty 90, 90d supply, fill #0
  Filled 2023-06-10: qty 90, 90d supply, fill #1
  Filled 2023-09-24 – 2023-10-06 (×2): qty 90, 90d supply, fill #2

## 2022-12-17 MED ORDER — DICLOFENAC SODIUM 1 % EX GEL
2.0000 g | Freq: Four times a day (QID) | CUTANEOUS | 1 refills | Status: DC
  Filled 2022-12-17: qty 200, 25d supply, fill #0
  Filled 2023-02-27: qty 200, 25d supply, fill #1

## 2022-12-17 MED ORDER — HUMULIN 70/30 (70-30) 100 UNIT/ML ~~LOC~~ SUSP
42.0000 [IU] | Freq: Two times a day (BID) | SUBCUTANEOUS | 1 refills | Status: DC
  Filled 2022-12-17: qty 70, 83d supply, fill #0
  Filled 2023-03-27: qty 70, 83d supply, fill #1
  Filled 2023-07-16: qty 70, 83d supply, fill #2

## 2022-12-17 NOTE — Patient Instructions (Addendum)
Referral to [facility] ph # [phone] address [address] [facility] Located in: [facility] Address: [address], [place], [place] [address] Phone: [phone]

## 2022-12-17 NOTE — Progress Notes (Signed)
Assessment & Plan:  [name] was seen today for medical management of chronic issues.  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 -     Dulaglutide (TRULICITY) 4.5 MG/0.5ML SOAJ; Inject 4.5 mg as directed once a week. -     insulin NPH-regular Human (HUMULIN 70/30) (70-30) 100 UNIT/ML injection; Inject 42 Units into the skin 2 (two) times daily with a meal.  Atherosclerosis of native artery of both lower extremities with intermittent claudication  Continue rosuvastatin daily as prescribed  Chronic obstructive pulmonary disease, unspecified COPD type  Stable.  Continue SABA as needed  Colon cancer screening -     Ambulatory referral to Gastroenterology  Chronic systolic heart failure (HCC) -     carvedilol (COREG) 6.25 MG tablet; Take 1 tablet (6.25 mg total) by mouth 2 (two) times daily with a meal. -     spironolactone (ALDACTONE) 25 MG tablet; Take 1 tablet (25 mg total) by mouth daily. DASH DIET  Primary hypertension -     carvedilol (COREG) 6.25 MG tablet; Take 1 tablet (6.25 mg total) by mouth 2 (two) times daily with a meal. Continue all antihypertensives as prescribed.  Reminded to bring in blood pressure log for follow  up appointment.  RECOMMENDATIONS: DASH/Mediterranean Diets are healthier choices for HTN.    Leukocytosis, unspecified type -     CBC with Differential  Dermatitis -     triamcinolone (KENALOG) 0.025 % ointment; Apply 1 Application topically 2 (two) times daily. Apply to left side of face  Arthralgia of  multiple joints -     diclofenac Sodium (VOLTAREN) 1 % GEL; Apply 2 g topically 4 (four) times daily. For JOINT PAIN    Patient has been counseled on age-appropriate routine health concerns for screening and prevention. These are reviewed and up-to-date. Referrals have been placed accordingly. Immunizations are up-to-date or declined.    Subjective:   Chief Complaint  Patient presents with   Medical Management of Chronic Issues    Felicia [name] 62 y.o. female presents to office today for follow up to DM  She has a past medical history of Cardiomyopathy, CHF, COPD, DDD, lumbar, Diabetes mellitus, Hypertension, Non compliance w medication regimen, and Normal echocardiogram.    She is followed by Cardiology as well. Entresto recently started.   She does not have any medications with her today or her BG meter.    DM 2 Improving. A1c down from 9.2 to 7.1. She is currently prescribed Trulicity, jardiance and 70/30 42 units BID. She checks her blood sugars prior to lunch with most readings 120s. LDL at goal with rosuvastatin 10 mg daily Lab Results  Component Value Date   HGBA1C 7.1 (A) 12/17/2022    Lab Results  Component Value Date   HGBA1C 9.2 (A) 04/05/2022    Lab Results  Component Value Date   LDLCALC 34 12/27/2021  Blood pressure is well controlled.   BP Readings from Last 3 Encounters:  12/17/22 116/75  12/02/22 126/86  04/05/22 108/72  Patient has been counseled on age-appropriate routine health concerns for screening and prevention. These are reviewed and up-to-date. Referrals have been placed accordingly. Immunizations are up-to-date or declined.     MAMMOGRAM: OVERDUE.  referral placed PAP SMEAR: UTD. COLON CANCER SCREENING: OVERDUE referral [facility] EYE EXAM: OVERDUE: referral placed  Review of Systems  Constitutional:  Negative for fever, malaise/fatigue and weight loss.  HENT: Negative.  Negative for nosebleeds.   Eyes: Negative.  Negative for blurred  vision, double vision and photophobia.  Respiratory: Negative.  Negative for cough and shortness of breath.   Cardiovascular: Negative.  Negative for chest pain, palpitations and leg swelling.  Gastrointestinal: Negative.  Negative for heartburn, nausea and vomiting.  Musculoskeletal:  Positive for joint pain. Negative for myalgias.  Neurological: Negative.  Negative for dizziness, focal weakness, seizures and headaches.  Psychiatric/Behavioral: Negative.  Negative for suicidal ideas.     Past Medical History:  Diagnosis Date   Cardiomyopathy    CHF (congestive heart failure) (HCC)    minamal   COPD (chronic obstructive pulmonary disease) (HCC)    DDD (degenerative disc disease), lumbar    Diabetes mellitus    Hypertension    Non compliance w medication regimen    Normal echocardiogram    LVEF 25-30% 03/27/2010    Past Surgical History:  Procedure Laterality Date   CARDIAC CATHETERIZATION     RIGHT/LEFT HEART CATH AND CORONARY ANGIOGRAPHY N/A 05/29/2017   Procedure: RIGHT/LEFT HEART CATH AND CORONARY ANGIOGRAPHY;  Surgeon: Dolores Patty, MD;  Location: [facility] INVASIVE CV LAB;  Service: Cardiovascular;  Laterality: N/A;   TUBAL LIGATION      Family History  Problem Relation Age of Onset   Stroke Mother    Lung cancer Father    Diabetes Daughter     Social History Reviewed with no changes to be made today.   Outpatient Medications Prior to Visit  Medication Sig Dispense Refill   acetaminophen (TYLENOL) 500 MG tablet Take 1,500 mg by mouth daily as needed for mild pain or headache.     albuterol (PROVENTIL) (2.5 MG/3ML) 0.083% nebulizer solution Take 3 mLs (2.5 mg total) by nebulization every 6 (six) hours as needed for wheezing or shortness of breath. 180 mL 1   aspirin EC 81 MG tablet Take 81 mg by mouth daily. Swallow whole.     empagliflozin (JARDIANCE) 10 MG TABS tablet Take 1 tablet (10 mg total) by mouth daily before breakfast. 90 tablet 3   ezetimibe (ZETIA) 10 MG  tablet Take 1 tablet (10 mg total) by mouth daily. 90 tablet 3   furosemide (LASIX) 20 MG tablet Take 1 tablet (20 mg total) by mouth daily. 90 tablet 1   hydrALAZINE (APRESOLINE) 50 MG tablet Take 1 tablet (50 mg total) by mouth 3 (three) times daily. 270 tablet 1   Insulin Syringe-Needle U-100 30G X 5/16" 0.5 ML MISC use as directed, to inject into the skin twice a day 200 each 6   Multiple Vitamin (MULTIVITAMIN WITH MINERALS) TABS tablet Take 1 tablet by mouth once a week.      sacubitril-valsartan (ENTRESTO) 49-51 MG Take 1 tablet by mouth 2 (two) times daily. 60 tablet 3   VENTOLIN HFA 108 (90 Base) MCG/ACT inhaler Inhale 2 puffs into the lungs every 6 (six) hours as needed for wheezing or shortness of breath. 18 g 2   carvedilol (COREG) 6.25 MG tablet Take 1 tablet (6.25 mg total) by mouth 2 (two) times daily with a meal. 180  tablet 1   Dulaglutide (TRULICITY) 4.5 MG/0.5ML SOAJ Inject 4.5 mg as directed once a week. 6 mL 1   insulin NPH-regular Human (HUMULIN 70/30) (70-30) 100 UNIT/ML injection Inject 42 Units into the skin 2 (two) times daily with a meal. 80 mL 1   rosuvastatin (CRESTOR) 10 MG tablet Take 1 tablet (10 mg total) by mouth daily.Patient will need to schedule an office visit for further refills. 90 tablet 0   spironolactone (ALDACTONE) 25 MG tablet Take 1 tablet (25 mg total) by mouth daily. 90 tablet 1   Accu-Chek Softclix Lancets lancets Use as directed to test blood sugars three times a day. (Patient not taking: Reported on 12/17/2022) 100 each 11   Blood Glucose Monitoring Suppl (ACCU-CHEK GUIDE) w/Device KIT use as directed (Patient not taking: Reported on 12/17/2022) 1 kit 0   glucose blood (TRUE METRIX BLOOD GLUCOSE TEST) test strip Use to check blood sugars up to 3 times per day (Patient not taking: Reported on 12/17/2022) 100 each 12   Blood Pressure Monitor DEVI Please provide patient with insurance approved blood pressure monitor I10.0 (Patient not taking: Reported on  12/17/2022) 1 each 0   No facility-administered medications prior to visit.    Allergies  Allergen Reactions   Banana Itching   Latex Itching       Objective:    BP 116/75 (BP Location: Left Arm, Patient Position: Sitting, Cuff Size: Normal)   Pulse 81   Ht 4\' 9"  (1.448 m)   Wt 190 lb (86.2 kg)   LMP 08/02/2010   SpO2 100%   BMI 41.12 kg/m  Wt Readings from Last 3 Encounters:  12/17/22 190 lb (86.2 kg)  12/02/22 187 lb 3.2 oz (84.9 kg)  04/05/22 189 lb 12.8 oz (86.1 kg)    Physical Exam Vitals and nursing note reviewed.  Constitutional:      Appearance: She is well-developed.  HENT:     Head: Normocephalic and atraumatic.  Cardiovascular:     Rate and Rhythm: Normal rate and regular rhythm.     Heart sounds: Normal heart sounds. No murmur heard.    No friction rub. No gallop.  Pulmonary:     Effort: Pulmonary effort is normal. No tachypnea or respiratory distress.     Breath sounds: Normal breath sounds. No decreased breath sounds, wheezing, rhonchi or rales.  Chest:     Chest wall: No tenderness.  Abdominal:     General: Bowel sounds are normal.     Palpations: Abdomen is soft.  Musculoskeletal:        General: Normal range of motion.     Cervical back: Normal range of motion.  Skin:    General: Skin is warm and dry.  Neurological:     Mental Status: She is alert and oriented to person, place, and time.     Coordination: Coordination normal.  Psychiatric:        Behavior: Behavior normal. Behavior is cooperative.        Thought Content: Thought content normal.        Judgment: Judgment normal.          Patient has been counseled extensively about nutrition and exercise as well as the importance of adherence with medications and regular follow-up. The patient was given clear instructions to go to ER or return to medical center if symptoms don't improve, worsen or new problems develop. The patient verbalized understanding.   Follow-up: Return in about  3 months (around 03/19/2023).  Claiborne Rigg, FNP-BC [facility] [place], [place] [phone]   12/17/2022, 3:44 PM

## 2022-12-19 LAB — CBC WITH DIFFERENTIAL/PLATELET
Basophils Absolute: 0.1 10*3/uL (ref 0.0–0.2)
Basos: 0 %
EOS (ABSOLUTE): 0.1 10*3/uL (ref 0.0–0.4)
Eos: 1 %
Hematocrit: 45.3 % (ref 34.0–46.6)
Hemoglobin: 14.6 g/dL (ref 11.1–15.9)
Immature Grans (Abs): 0 10*3/uL (ref 0.0–0.1)
Immature Granulocytes: 0 %
Lymphocytes Absolute: 4.5 10*3/uL — ABNORMAL HIGH (ref 0.7–3.1)
Lymphs: 39 %
MCH: 29.9 pg (ref 26.6–33.0)
MCHC: 32.2 g/dL (ref 31.5–35.7)
MCV: 93 fL (ref 79–97)
Monocytes Absolute: 0.7 10*3/uL (ref 0.1–0.9)
Monocytes: 6 %
Neutrophils Absolute: 6 10*3/uL (ref 1.4–7.0)
Neutrophils: 54 %
Platelets: 287 10*3/uL (ref 150–450)
RBC: 4.89 x10E6/uL (ref 3.77–5.28)
RDW: 13.8 % (ref 11.7–15.4)
WBC: 11.4 10*3/uL — ABNORMAL HIGH (ref 3.4–10.8)

## 2022-12-19 LAB — MICROALBUMIN / CREATININE URINE RATIO
Creatinine, Urine: 68 mg/dL
Microalb/Creat Ratio: 9 mg/g{creat} (ref 0–29)
Microalbumin, Urine: 6.3 ug/mL

## 2022-12-20 ENCOUNTER — Other Ambulatory Visit: Payer: Self-pay

## 2022-12-23 ENCOUNTER — Ambulatory Visit (HOSPITAL_COMMUNITY): Payer: Medicaid Other

## 2022-12-24 ENCOUNTER — Encounter (HOSPITAL_COMMUNITY): Payer: Medicaid Other

## 2022-12-25 ENCOUNTER — Ambulatory Visit: Payer: Medicaid Other

## 2023-01-06 ENCOUNTER — Encounter: Payer: Self-pay | Admitting: Podiatry

## 2023-01-06 ENCOUNTER — Ambulatory Visit (INDEPENDENT_AMBULATORY_CARE_PROVIDER_SITE_OTHER): Payer: Medicaid Other | Admitting: Podiatry

## 2023-01-06 DIAGNOSIS — M79674 Pain in right toe(s): Secondary | ICD-10-CM

## 2023-01-06 DIAGNOSIS — B351 Tinea unguium: Secondary | ICD-10-CM | POA: Diagnosis not present

## 2023-01-06 DIAGNOSIS — M79675 Pain in left toe(s): Secondary | ICD-10-CM

## 2023-01-06 DIAGNOSIS — E119 Type 2 diabetes mellitus without complications: Secondary | ICD-10-CM

## 2023-01-06 NOTE — Progress Notes (Signed)
This patient presents to the office with chief complaint of long thick deformed big toenails both feet.  She says she had surgery performed years ago and both great toenails were removed and she asked for the nails to regrow.  The nails have returned and are deformed  The nails become painful when the nails are thick.  She says she is diabetic.  She presents for evaluation and treatment.    General Appearance  Alert, conversant and in no acute stress.  Vascular  Dorsalis pedis and posterior tibial  pulses are palpable  bilaterally.  Capillary return is within normal limits  bilaterally. Temperature is within normal limits  bilaterally.  Neurologic  Senn-Weinstein monofilament wire test within normal limits  bilaterally. Muscle power within normal limits bilaterally.  Nails Thick disfigured discolored nails with subungual debris   hallux  bilaterally and second toenail left foot.. No evidence of bacterial infection or drainage bilaterally.  Orthopedic  No limitations of motion  feet .  No crepitus or effusions noted.  No bony pathology or digital deformities noted.  Skin  normotropic skin with no porokeratosis noted bilaterally.  No signs of infections or ulcers noted.   Onychomycosis  B/L  Debrided nails with nail nipper followed by dremel tool usage.   Schedule for nail surgery with Dr.  Charlsie Merles.   RTC 3 months.   Helane Gunther DPM

## 2023-01-10 ENCOUNTER — Other Ambulatory Visit: Payer: Self-pay

## 2023-01-10 ENCOUNTER — Other Ambulatory Visit: Payer: Self-pay | Admitting: Internal Medicine

## 2023-01-10 MED ORDER — EMPAGLIFLOZIN 10 MG PO TABS
10.0000 mg | ORAL_TABLET | Freq: Every day | ORAL | 3 refills | Status: AC
  Filled 2023-01-10 – 2023-02-06 (×2): qty 90, 90d supply, fill #0
  Filled 2023-05-07: qty 90, 90d supply, fill #1
  Filled 2023-08-26: qty 90, 90d supply, fill #2
  Filled 2023-12-04: qty 90, 90d supply, fill #3

## 2023-01-21 ENCOUNTER — Other Ambulatory Visit: Payer: Self-pay

## 2023-01-29 ENCOUNTER — Ambulatory Visit: Payer: Medicaid Other | Admitting: Podiatry

## 2023-01-31 ENCOUNTER — Other Ambulatory Visit: Payer: Self-pay

## 2023-02-06 ENCOUNTER — Other Ambulatory Visit: Payer: Self-pay

## 2023-02-11 ENCOUNTER — Other Ambulatory Visit: Payer: Self-pay

## 2023-02-14 ENCOUNTER — Emergency Department (HOSPITAL_COMMUNITY): Payer: Medicaid Other

## 2023-02-14 ENCOUNTER — Emergency Department (HOSPITAL_COMMUNITY)
Admission: EM | Admit: 2023-02-14 | Discharge: 2023-02-14 | Disposition: A | Discharge status: Home / Self Care | Payer: Medicaid Other | Attending: Emergency Medicine | Admitting: Emergency Medicine

## 2023-02-14 ENCOUNTER — Other Ambulatory Visit: Payer: Self-pay

## 2023-02-14 ENCOUNTER — Encounter (HOSPITAL_COMMUNITY): Payer: Self-pay

## 2023-02-14 DIAGNOSIS — J449 Chronic obstructive pulmonary disease, unspecified: Secondary | ICD-10-CM | POA: Diagnosis not present

## 2023-02-14 DIAGNOSIS — S21002A Unspecified open wound of left breast, initial encounter: Secondary | ICD-10-CM | POA: Insufficient documentation

## 2023-02-14 DIAGNOSIS — Z7982 Long term (current) use of aspirin: Secondary | ICD-10-CM | POA: Insufficient documentation

## 2023-02-14 DIAGNOSIS — X58XXXA Exposure to other specified factors, initial encounter: Secondary | ICD-10-CM | POA: Diagnosis not present

## 2023-02-14 DIAGNOSIS — Z91040 Latex allergy status: Secondary | ICD-10-CM | POA: Insufficient documentation

## 2023-02-14 DIAGNOSIS — I509 Heart failure, unspecified: Secondary | ICD-10-CM | POA: Diagnosis not present

## 2023-02-14 DIAGNOSIS — D72829 Elevated white blood cell count, unspecified: Secondary | ICD-10-CM | POA: Diagnosis not present

## 2023-02-14 DIAGNOSIS — E119 Type 2 diabetes mellitus without complications: Secondary | ICD-10-CM | POA: Insufficient documentation

## 2023-02-14 DIAGNOSIS — R079 Chest pain, unspecified: Secondary | ICD-10-CM | POA: Diagnosis not present

## 2023-02-14 DIAGNOSIS — I110 Hypertensive heart disease with heart failure: Secondary | ICD-10-CM | POA: Insufficient documentation

## 2023-02-14 LAB — CBC WITH DIFFERENTIAL/PLATELET
Abs Immature Granulocytes: 0.04 10*3/uL (ref 0.00–0.07)
Basophils Absolute: 0 10*3/uL (ref 0.0–0.1)
Basophils Relative: 0 %
Eosinophils Absolute: 0.3 10*3/uL (ref 0.0–0.5)
Eosinophils Relative: 3 %
HCT: 38.7 % (ref 36.0–46.0)
Hemoglobin: 12.8 g/dL (ref 12.0–15.0)
Immature Granulocytes: 0 %
Lymphocytes Relative: 32 %
Lymphs Abs: 3.3 10*3/uL (ref 0.7–4.0)
MCH: 30.9 pg (ref 26.0–34.0)
MCHC: 33.1 g/dL (ref 30.0–36.0)
MCV: 93.5 fL (ref 80.0–100.0)
Monocytes Absolute: 0.6 10*3/uL (ref 0.1–1.0)
Monocytes Relative: 5 %
Neutro Abs: 6.3 10*3/uL (ref 1.7–7.7)
Neutrophils Relative %: 60 %
Platelets: 329 10*3/uL (ref 150–400)
RBC: 4.14 MIL/uL (ref 3.87–5.11)
RDW: 16.5 % — ABNORMAL HIGH (ref 11.5–15.5)
WBC: 10.6 10*3/uL — ABNORMAL HIGH (ref 4.0–10.5)
nRBC: 0 % (ref 0.0–0.2)

## 2023-02-14 LAB — COMPREHENSIVE METABOLIC PANEL
ALT: 16 U/L (ref 0–44)
AST: 17 U/L (ref 15–41)
Albumin: 3.3 g/dL — ABNORMAL LOW (ref 3.5–5.0)
Alkaline Phosphatase: 79 U/L (ref 38–126)
Anion gap: 11 (ref 5–15)
BUN: 17 mg/dL (ref 8–23)
CO2: 23 mmol/L (ref 22–32)
Calcium: 9 mg/dL (ref 8.9–10.3)
Chloride: 102 mmol/L (ref 98–111)
Creatinine, Ser: 0.89 mg/dL (ref 0.44–1.00)
GFR, Estimated: >60 mL/min (ref >60)
Glucose, Bld: 134 mg/dL — ABNORMAL HIGH (ref 70–99)
Potassium: 4.3 mmol/L (ref 3.5–5.1)
Sodium: 136 mmol/L (ref 135–145)
Total Bilirubin: 0.3 mg/dL (ref 0.0–1.2)
Total Protein: 6.7 g/dL (ref 6.5–8.1)

## 2023-02-14 LAB — I-STAT CG4 LACTIC ACID, ED: Lactic Acid, Venous: 1.1 mmol/L (ref 0.5–1.9)

## 2023-02-14 MED ORDER — DOXYCYCLINE HYCLATE 100 MG PO CAPS: 100.0000 mg | ORAL_CAPSULE | Freq: Two times a day (BID) | ORAL | 0 refills | Status: DC

## 2023-02-14 MED ORDER — CLOTRIMAZOLE 1 % EX CREA
TOPICAL_CREAM | CUTANEOUS | 0 refills | Status: AC
Start: 2023-02-14 — End: ?

## 2023-02-14 NOTE — ED Provider Notes (Signed)
[place] EMERGENCY DEPARTMENT AT [facility] Provider Note   CSN: [id number] Arrival date & time: 02/14/23  1019     History  Chief Complaint  Patient presents with   Breast Pain    Felicia [name] is a 63 y.o. female.  Patient is a 63 year old female with a history of COPD, CHF, cardiomyopathy, hypertension, diabetes who is presenting today with a wound on her left breast that is been gradually worsening over the last week and a half.  She reports it initially was a red area that was itchy and she was scratching it and started putting hydrocortisone cream on it which did initially help with the itching but then she reports it got a blister and the blister came off and now it is been more of an open wound.  She still reports it feels itchy but has not been draining and she does not have pain.  She continues to use the hydrocortisone cream but denies putting anything else on it.  She is not aware that she was bit by anything and denies any other areas elsewhere.  The history is provided by the patient.       Home Medications Prior to Admission medications   Medication Sig Start Date End Date Taking? Authorizing Provider  clotrimazole (LOTRIMIN) 1 % cream Apply to affected area 2 times daily 02/14/23  Yes Sybol Morre, Alphonzo Lemmings, MD  doxycycline (VIBRAMYCIN) 100 MG capsule Take 1 capsule (100 mg total) by mouth 2 (two) times daily. 02/14/23  Yes Gwyneth Sprout, MD  Accu-Chek Softclix Lancets lancets Use as directed to test blood sugars three times a day. Patient not taking: Reported on 12/17/2022 01/02/22   [name], MD  acetaminophen (TYLENOL) 500 MG tablet Take 1,500 mg by mouth daily as needed for mild pain or headache.    [provider]  albuterol (PROVENTIL) (2.5 MG/3ML) 0.083% nebulizer solution Take 3 mLs (2.5 mg total) by nebulization every 6 (six) hours as needed for wheezing or shortness of breath. 05/15/22   Hoy Register, MD  aspirin EC 81 MG  tablet Take 81 mg by mouth daily. Swallow whole.    [provider]  Blood Glucose Monitoring Suppl (ACCU-CHEK GUIDE) w/Device KIT use as directed Patient not taking: Reported on 12/17/2022 01/02/22   [name], MD  carvedilol (COREG) 6.25 MG tablet Take 1 tablet (6.25 mg total) by mouth 2 (two) times daily with a meal. 12/17/22   Claiborne Rigg, NP  diclofenac Sodium (VOLTAREN) 1 % GEL Apply 2 g topically 4 (four) times daily. For JOINT PAIN 12/17/22   Claiborne Rigg, NP  Dulaglutide (TRULICITY) 4.5 MG/0.5ML SOAJ Inject 4.5 mg as directed once a week. 12/17/22   Claiborne Rigg, NP  empagliflozin (JARDIANCE) 10 MG TABS tablet Take 1 tablet (10 mg total) by mouth daily before breakfast. 01/10/23   [name], MD  ezetimibe (ZETIA) 10 MG tablet Take 1 tablet (10 mg total) by mouth daily. 01/04/22   Claiborne Rigg, NP  furosemide (LASIX) 20 MG tablet Take 1 tablet (20 mg total) by mouth daily. 01/04/22   Claiborne Rigg, NP  glucose blood (TRUE METRIX BLOOD GLUCOSE TEST) test strip Use to check blood sugars up to 3 times per day Patient not taking: Reported on 12/17/2022 01/02/22   [name], MD  hydrALAZINE (APRESOLINE) 50 MG tablet Take 1 tablet (50 mg total) by mouth 3 (three) times daily. 01/04/22   Claiborne Rigg, NP  insulin NPH-regular Human (HUMULIN 70/30) (70-30) 100 UNIT/ML injection Inject 42 Units into the skin 2 (two) times daily with a meal. 12/17/22   Claiborne Rigg, NP  Insulin Syringe-Needle U-100 30G X 5/16" 0.5 ML MISC use as directed, to inject into the skin twice a day 04/11/21   Claiborne Rigg, NP  Multiple Vitamin (MULTIVITAMIN WITH MINERALS) TABS tablet Take 1 tablet by mouth once a week.     [provider]  rosuvastatin (CRESTOR) 10 MG tablet Take 1 tablet (10 mg total) by mouth daily. 12/17/22   Claiborne Rigg, NP  sacubitril-valsartan (ENTRESTO) 49-51 MG Take 1 tablet by mouth 2 (two) times daily. 12/02/22   Gaston Islam., NP  spironolactone (ALDACTONE) 25 MG tablet Take 1 tablet (25 mg total) by mouth daily. 12/17/22   Claiborne Rigg, NP  triamcinolone (KENALOG) 0.025 % ointment Apply 1 Application topically 2 (two) times daily. Apply to left side of face 12/17/22   Claiborne Rigg, NP  VENTOLIN HFA 108 (90 Base) MCG/ACT inhaler Inhale 2 puffs into the lungs every 6 (six) hours as needed for wheezing or shortness of breath. 05/16/22   Claiborne Rigg, NP      Allergies    Banana and Latex    Review of Systems   Review of Systems  Physical Exam Updated Vital Signs BP 119/66 (BP Location: Right Arm)   Pulse 79   Temp 97.9 F (36.6 C) (Oral)   Resp 16   Ht 4\' 11"  (1.499 m)   Wt 86.2 kg   LMP 08/02/2010   SpO2 93%   BMI 38.38 kg/m  Physical Exam Vitals and nursing note reviewed.  Cardiovascular:     Rate and Rhythm: Normal rate.  Pulmonary:     Effort: Pulmonary effort is normal.  Chest:       Comments: Right breast is normal Neurological:     Mental Status: She is alert.     ED Results / Procedures / Treatments   Labs (all labs ordered are listed, but only abnormal results are displayed) Labs Reviewed  COMPREHENSIVE METABOLIC PANEL - Abnormal; Notable for the following components:      Result Value   Glucose, Bld 134 (*)    Albumin 3.3 (*)    All other components within normal limits  CBC WITH DIFFERENTIAL/PLATELET - Abnormal; Notable for the following components:   WBC 10.6 (*)    RDW 16.5 (*)    All other components within normal limits  URINALYSIS, W/ REFLEX TO CULTURE (INFECTION SUSPECTED)  I-STAT CG4 LACTIC ACID, ED  I-STAT CG4 LACTIC ACID, ED    EKG None  Radiology DG Chest 2 View Result Date: 02/14/2023 CLINICAL DATA:  Left-sided chest pain. EXAM: CHEST - 2 VIEW COMPARISON:  01/12/2018 FINDINGS: The heart size and mediastinal contours are within normal limits. Both lungs are clear. The visualized skeletal structures are unremarkable. IMPRESSION: No active  cardiopulmonary disease. Electronically Signed   By: Danae Orleans M.D.   On: 02/14/2023 11:36    Procedures Procedures    Medications Ordered in ED Medications - No data to display  ED Course/ Medical Decision Making/ A&P                                 Medical Decision Making Amount and/or Complexity of Data Reviewed Labs: ordered. Radiology: ordered.  Risk Prescription drug management.   Pt with  multiple medical problems and comorbidities and presenting today with a complaint that caries a high risk for morbidity and mortality.  Here today with a  wound on the left breast.  Concerned that she may have been bit by something or related to ongoing use of steroid cream to the area.  Also she has a history of diabetes but reports her blood sugar has been well-controlled recently.  She has no evidence concerning for an abscess and does not have significant expanding cellulitis.  Also very low concern for malignancy.  Will give oral antibiotics have patient discontinue the use of the steroid cream and start an antifungal in case this does have a fungal component.  Wound care provided and discussed with patient following up doing wound care daily and a repeat check next week.  She is comfortable with this plan also discussed it with her daughter who is comfortable.  Patient discharged home in good condition.  I independently interpreted patient's labs that she had done in triage and lactic acid within normal limits, CBC with a white count of 10 but otherwise normal and CMP without acute findings, blood sugar of 134.  I have independently visualized and interpreted pt's images today.  X-ray within normal limits.          Final Clinical Impression(s) / ED Diagnoses Final diagnoses:  Open wound of left breast, initial encounter    Rx / DC Orders ED Discharge Orders          Ordered    doxycycline (VIBRAMYCIN) 100 MG capsule  2 times daily        02/14/23 1508    clotrimazole  (LOTRIMIN) 1 % cream        02/14/23 1508              Gwyneth Sprout, MD 02/14/23 1516

## 2023-02-14 NOTE — Discharge Instructions (Signed)
Stop putting steroid cream on the area.  Try to keep it dry by placing a dressing but also put the antifungal cream on it.  You could start the antibiotics today.  Also avoid anything like Neosporin.

## 2023-02-14 NOTE — ED Triage Notes (Signed)
Pt here for open wound on left breast for a week and a half. C/O itchiness and erythema. Denies discharge and pain. Denies fevers.

## 2023-02-14 NOTE — ED Notes (Signed)
Pt d/c home per EDP order. Discharge summary reviewed, verbalize understanding. NAD. Reports d/c ride home.

## 2023-02-21 ENCOUNTER — Other Ambulatory Visit: Payer: Self-pay | Admitting: Nurse Practitioner

## 2023-02-21 DIAGNOSIS — I10 Essential (primary) hypertension: Secondary | ICD-10-CM

## 2023-02-21 NOTE — Telephone Encounter (Signed)
Requested medication (s) are due for refill today: Yes  Requested medication (s) are on the active medication list: Yes  Last refill:  01/04/22  Future visit scheduled: Yes  Notes to clinic:  Unable to refill per protocol due to failed labs, no updated results.      Requested Prescriptions  Pending Prescriptions Disp Refills   ezetimibe (ZETIA) 10 MG tablet 90 tablet 3    Sig: Take 1 tablet (10 mg total) by mouth daily.     Cardiovascular:  Antilipid - Sterol Transport Inhibitors Failed - 02/21/2023  1:36 PM      Failed - Lipid Panel in normal range within the last 12 months    Cholesterol, Total  Date Value Ref Range Status  12/27/2021 97 (L) 100 - 199 mg/dL Final   LDL Chol Calc (NIH)  Date Value Ref Range Status  12/27/2021 34 0 - 99 mg/dL Final   HDL  Date Value Ref Range Status  12/27/2021 43 >39 mg/dL Final   Triglycerides  Date Value Ref Range Status  12/27/2021 109 0 - 149 mg/dL Final         Passed - AST in normal range and within 360 days    AST  Date Value Ref Range Status  02/14/2023 17 15 - 41 U/L Final         Passed - ALT in normal range and within 360 days    ALT  Date Value Ref Range Status  02/14/2023 16 0 - 44 U/L Final         Passed - Patient is not pregnant      Passed - Valid encounter within last 12 months    Recent Outpatient Visits           2 months ago 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 [facility] [place], [place] W, NP   10 months ago Type 2 diabetes mellitus with other specified complication, with long-term current use of insulin (HCC)   [facility] Dept Of [place]. [facility] [place], [place] W, NP   1 year ago Type 2 diabetes mellitus with other specified complication, with long-term current use of  insulin (HCC)   [facility] - A Dept Of [place]. [facility] Claiborne Rigg, NP   1 year ago Encounter for annual physical exam   [facility] Dept Of [place]. [facility] [place], [place] W, NP   2 years ago Type 2 diabetes mellitus with other specified complication, with long-term current use of insulin (HCC)   [facility] - A Dept Of [place]. [facility] Claiborne Rigg, NP       Future Appointments             In 2 weeks Louanne Skye Devoria Albe., NP [facility] at [facility], [facility]             hydrALAZINE (APRESOLINE) 50 MG tablet 270 tablet 1    Sig: Take 1 tablet (50 mg total) by mouth 3 (three) times daily.     Cardiovascular:  Vasodilators Failed - 02/21/2023  1:36 PM      Failed - WBC in normal range and within 360 days  WBC  Date Value Ref Range Status  02/14/2023 10.6 (H) 4.0 - 10.5 K/uL Final         Failed - ANA Screen, Ifa, Serum in normal range and within 360 days    No results found for: "ANA", "ANATITER", "LABANTI"       Passed - HCT in normal range and within 360 days    HCT  Date Value Ref Range Status  02/14/2023 38.7 36.0 - 46.0 % Final   Hematocrit  Date Value Ref Range Status  12/17/2022 45.3 34.0 - 46.6 % Final         Passed - HGB in normal range and within 360 days    Hemoglobin  Date Value Ref Range Status  02/14/2023 12.8 12.0 - 15.0 g/dL Final  62/13/0865 78.4 11.1 - 15.9 g/dL Final         Passed - RBC in normal range and within 360 days    RBC  Date Value Ref Range Status  02/14/2023 4.14 3.87 - 5.11 MIL/uL Final         Passed - PLT in normal range and within 360 days    Platelets  Date Value Ref Range Status  02/14/2023 329 150 - 400 K/uL Final  12/17/2022 287 150 - 450 x10E3/uL Final         Passed - Last BP in normal range    BP Readings from Last 1 Encounters:  02/14/23 119/66         Passed - Valid  encounter within last 12 months    Recent Outpatient Visits           2 months ago 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 [facility] [place], [place] W, NP   10 months ago Type 2 diabetes mellitus with other specified complication, with long-term current use of insulin (HCC)   [facility] - A Dept Of [place]. [facility] [place], [place] W, NP   1 year ago Type 2 diabetes mellitus with other specified complication, with long-term current use of insulin (HCC)   [facility] - A Dept Of [place]. [facility] Claiborne Rigg, NP   1 year ago Encounter for annual physical exam   [facility] - A Dept Of [place]. [facility] [place], [place] W, NP   2 years ago Type 2 diabetes mellitus with other specified complication, with long-term current use of insulin (HCC)   [facility] - A Dept Of [place]. [facility] Claiborne Rigg, NP       Future Appointments             In 2 weeks Louanne Skye, Devoria Albe., NP [facility] at [facility], [facility]

## 2023-02-24 ENCOUNTER — Other Ambulatory Visit: Payer: Self-pay

## 2023-02-24 MED ORDER — EZETIMIBE 10 MG PO TABS
10.0000 mg | ORAL_TABLET | Freq: Every day | ORAL | 3 refills | Status: DC
  Filled 2023-02-24: qty 90, 90d supply, fill #0
  Filled 2023-06-10: qty 90, 90d supply, fill #1

## 2023-02-24 MED ORDER — HYDRALAZINE HCL 50 MG PO TABS
50.0000 mg | ORAL_TABLET | Freq: Three times a day (TID) | ORAL | 1 refills | Status: DC
  Filled 2023-02-24: qty 270, 90d supply, fill #0
  Filled 2023-06-10: qty 270, 90d supply, fill #1

## 2023-02-26 ENCOUNTER — Other Ambulatory Visit: Payer: Self-pay

## 2023-02-26 ENCOUNTER — Ambulatory Visit (INDEPENDENT_AMBULATORY_CARE_PROVIDER_SITE_OTHER): Payer: Medicaid Other | Admitting: Physician Assistant

## 2023-02-26 ENCOUNTER — Ambulatory Visit (HOSPITAL_COMMUNITY)
Admission: RE | Admit: 2023-02-26 | Discharge: 2023-02-26 | Disposition: A | Discharge status: Home / Self Care | Payer: Medicaid Other | Source: Ambulatory Visit | Attending: Physician Assistant | Admitting: Physician Assistant

## 2023-02-26 VITALS — BP 99/68 | HR 85 | Temp 98.6°F | Wt 186.1 lb

## 2023-02-26 DIAGNOSIS — I70221 Atherosclerosis of native arteries of extremities with rest pain, right leg: Secondary | ICD-10-CM

## 2023-02-26 DIAGNOSIS — M79604 Pain in right leg: Secondary | ICD-10-CM | POA: Diagnosis not present

## 2023-02-26 LAB — VAS US ABI WITH/WO TBI
Left ABI: 0.99
Right ABI: 0.3

## 2023-02-26 MED ORDER — OXYCODONE HCL 5 MG PO TABS
5.0000 mg | ORAL_TABLET | Freq: Four times a day (QID) | ORAL | 0 refills | Status: DC | PRN
  Filled 2023-02-26: qty 15, 4d supply, fill #0

## 2023-02-26 NOTE — H&P (View-Only) (Signed)
 Office Note   History of Present Illness   Felicia [name] is a 63 y.o. ([birth year]) female who presents for surveillance of PAD.  She was first evaluated by Dr. Sheree in March 2022 with short distance claudication in bilateral lower extremities, right greater than left.  She did not have any history of rest pain or tissue loss.  She was started on a daily aspirin  and statin.  She was also urged to quit smoking and start a regular walking program.  She returns today for follow-up.  She states her claudication in the left calf is about the same, after about 50 yards of walking.  Her claudication in the right calf has worsened over the past year and now she can go about 30 to 40 yards before stopping.  Also over the past couple of months she has been experiencing pain and numbness in her right lower leg and foot after prolonged periods of standing.  Her daughter is present and says that it takes her at least 2 hours to wash the dishes because of how many breaks she has to take.  Additionally over the past couple of months she has been experiencing numbness and tingling in her right foot that will wake her up from her sleep, about 3 times a week.  She will have to hang her foot off the bed and wiggle it around for relief.  This typically takes about 5 to 10 minutes before she can go back to sleep.  She has tried extra strength Tylenol  for her pain, which does not help.  She denies any rest pain in the left lower extremity.  She denies any tissue loss of bilateral lower extremities.  She tries to stay active and go on walks with her family, however her symptoms are very limiting to her.  For the past year she is cut down to 5 cigarettes daily.  She is still taking a daily aspirin  and statin.  Current Outpatient Medications  Medication Sig Dispense Refill   Accu-Chek Softclix Lancets lancets Use as directed to test blood sugars three times a day. 100 each 11   acetaminophen  (TYLENOL ) 500 MG  tablet Take 1,500 mg by mouth daily as needed for mild pain or headache.     albuterol  (PROVENTIL ) (2.5 MG/3ML) 0.083% nebulizer solution Take 3 mLs (2.5 mg total) by nebulization every 6 (six) hours as needed for wheezing or shortness of breath. 180 mL 1   aspirin  EC 81 MG tablet Take 81 mg by mouth daily. Swallow whole.     Blood Glucose Monitoring Suppl (ACCU-CHEK GUIDE) w/Device KIT use as directed 1 kit 0   carvedilol  (COREG ) 6.25 MG tablet Take 1 tablet (6.25 mg total) by mouth 2 (two) times daily with a meal. 180 tablet 1   clotrimazole  (LOTRIMIN ) 1 % cream Apply to affected area 2 times daily 15 g 0   diclofenac  Sodium (VOLTAREN ) 1 % GEL Apply 2 g topically 4 (four) times daily. For JOINT PAIN 200 g 1   doxycycline  (VIBRAMYCIN ) 100 MG capsule Take 1 capsule (100 mg total) by mouth 2 (two) times daily. 14 capsule 0   Dulaglutide  (TRULICITY ) 4.5 MG/0.5ML SOAJ Inject 4.5 mg as directed once a week. 6 mL 1   empagliflozin  (JARDIANCE ) 10 MG TABS tablet Take 1 tablet (10 mg total) by mouth daily before breakfast. 90 tablet 3   ezetimibe  (ZETIA ) 10 MG tablet Take 1 tablet (10 mg total) by mouth daily. 90 tablet 3  furosemide  (LASIX ) 20 MG tablet Take 1 tablet (20 mg total) by mouth daily. 90 tablet 1   glucose blood (TRUE METRIX BLOOD GLUCOSE TEST) test strip Use to check blood sugars up to 3 times per day 100 each 12   hydrALAZINE  (APRESOLINE ) 50 MG tablet Take 1 tablet (50 mg total) by mouth 3 (three) times daily. 270 tablet 1   insulin  NPH-regular Human (HUMULIN  70/30) (70-30) 100 UNIT/ML injection Inject 42 Units into the skin 2 (two) times daily with a meal. 80 mL 1   Insulin  Syringe-Needle U-100 30G X 5/16 0.5 ML MISC use as directed, to inject into the skin twice a day 200 each 6   Multiple Vitamin (MULTIVITAMIN WITH MINERALS) TABS tablet Take 1 tablet by mouth once a week.      rosuvastatin  (CRESTOR ) 10 MG tablet Take 1 tablet (10 mg total) by mouth daily. 90 tablet 3    sacubitril -valsartan  (ENTRESTO ) 49-51 MG Take 1 tablet by mouth 2 (two) times daily. 60 tablet 3   spironolactone  (ALDACTONE ) 25 MG tablet Take 1 tablet (25 mg total) by mouth daily. 90 tablet 1   triamcinolone  (KENALOG ) 0.025 % ointment Apply 1 Application topically 2 (two) times daily. Apply to left side of face 60 g 1   VENTOLIN  HFA 108 (90 Base) MCG/ACT inhaler Inhale 2 puffs into the lungs every 6 (six) hours as needed for wheezing or shortness of breath. 18 g 2   No current facility-administered medications for this visit.    REVIEW OF SYSTEMS (negative unless checked):   Cardiac:  []  Chest pain or chest pressure? []  Shortness of breath upon activity? []  Shortness of breath when lying flat? []  Irregular heart rhythm?  Vascular:  [x]  Pain in calf, thigh, or hip brought on by walking? [x]  Pain in feet at night that wakes you up from your sleep? []  Blood clot in your veins? []  Leg swelling?  Pulmonary:  []  Oxygen at home? []  Productive cough? []  Wheezing?  Neurologic:  []  Sudden weakness in arms or legs? []  Sudden numbness in arms or legs? []  Sudden onset of difficult speaking or slurred speech? []  Temporary loss of vision in one eye? []  Problems with dizziness?  Gastrointestinal:  []  Blood in stool? []  Vomited blood?  Genitourinary:  []  Burning when urinating? []  Blood in urine?  Psychiatric:  []  Major depression  Hematologic:  []  Bleeding problems? []  Problems with blood clotting?  Dermatologic:  []  Rashes or ulcers?  Constitutional:  []  Fever or chills?  Ear/Nose/Throat:  []  Change in hearing? []  Nose bleeds? []  Sore throat?  Musculoskeletal:  []  Back pain? []  Joint pain? []  Muscle pain?   Physical Examination   Vitals:   02/26/23 1429  BP: 99/68  Pulse: 85  Temp: 98.6 F (37 C)  TempSrc: Temporal  SpO2: 90%  Weight: 186 lb 1.6 oz (84.4 kg)   Body mass index is 37.59 kg/m.  General:  WDWN in NAD; vital signs documented  above Gait: Not observed HENT: WNL, normocephalic Pulmonary: normal non-labored breathing  Cardiac: regular Abdomen: soft, NT, no masses Skin: without rashes Vascular Exam/Pulses: 2+ femoral pulses bilaterally.  Nonpalpable popliteal pulses bilaterally.  Nonpalpable pedal pulses bilaterally.  Dampened monophasic right DP/PT/peroneal Doppler signals. Extremities: without ischemic changes, without gangrene , without cellulitis; without open wounds;  Musculoskeletal: no muscle wasting or atrophy  Neurologic: A&O X 3;  No focal weakness or paresthesias are detected Psychiatric:  The pt has Normal affect.  Non-Invasive Vascular imaging  ABI (02/26/2023) R:  ABI: 0.3 (0.54),  PT: mono DP: mono TBI:  absent L:  ABI: 0.99 (0.72),  PT: tri DP: bi TBI: 0.42   Medical Decision Making   KELLAN BOEHLKE is a 63 y.o. female who presents for surveillance of PAD  Based on the patient's vascular studies, her ABIs on the left appear improved from 0.72 to 0.99.  Her ABIs on the right have decreased from 0.54 to 0.3.  She has dampened monophasic tibial vessel flow on the right.  She has an absent toe pressure on the right. The patient's claudication in the left lower extremity has not changed since her last visit.  Over the past year her right lower extremity claudication has worsened, so now she can only walk about 30 to 40 yards before she has to stop.  After prolonged periods of standing, the patient also experiences severe pain and numbness in the right lower leg and foot. The patient also states over the past couple of months her right foot will go numb and tingly, which will wake her up from her sleep.  She will have to dangle her foot off the bed for relief.  This happens at least 3 nights a week.  I believe this is a sign of early rest pain On exam she has 2+ femoral pulses bilaterally.  She has nonpalpable popliteal or pedal pulses bilaterally.  She has dampened monophasic right DP/PT  Doppler signals.  She has brisk left DP/PT Doppler signals. I believe that the patient would benefit from right lower extremity angiogram, given that her right lower extremity claudication has drastically worsened and she has now started developing signs of early rest pain.  I have explained to the patient she may require intervention on her right lower extremity during her angiogram.  Risk versus benefits discussed.  She is agreeable to this procedure. She will continue her current medications.  We can schedule her for abdominal aortogram with right lower extremity runoff, with possible SFA/popliteal intervention and possible tibial intervention in the next couple of weeks.  I will prescribe her a short course of pain medication for the time being.   Ahmed Holster PA-C [facility] Office: [phone]  Clinic MD: Sheree

## 2023-02-26 NOTE — Progress Notes (Signed)
 Office Note   History of Present Illness   Felicia [name] is a 63 y.o. ([birth year]) female who presents for surveillance of PAD.  She was first evaluated by Dr. Sheree in March 2022 with short distance claudication in bilateral lower extremities, right greater than left.  She did not have any history of rest pain or tissue loss.  She was started on a daily aspirin  and statin.  She was also urged to quit smoking and start a regular walking program.  She returns today for follow-up.  She states her claudication in the left calf is about the same, after about 50 yards of walking.  Her claudication in the right calf has worsened over the past year and now she can go about 30 to 40 yards before stopping.  Also over the past couple of months she has been experiencing pain and numbness in her right lower leg and foot after prolonged periods of standing.  Her daughter is present and says that it takes her at least 2 hours to wash the dishes because of how many breaks she has to take.  Additionally over the past couple of months she has been experiencing numbness and tingling in her right foot that will wake her up from her sleep, about 3 times a week.  She will have to hang her foot off the bed and wiggle it around for relief.  This typically takes about 5 to 10 minutes before she can go back to sleep.  She has tried extra strength Tylenol  for her pain, which does not help.  She denies any rest pain in the left lower extremity.  She denies any tissue loss of bilateral lower extremities.  She tries to stay active and go on walks with her family, however her symptoms are very limiting to her.  For the past year she is cut down to 5 cigarettes daily.  She is still taking a daily aspirin  and statin.  Current Outpatient Medications  Medication Sig Dispense Refill   Accu-Chek Softclix Lancets lancets Use as directed to test blood sugars three times a day. 100 each 11   acetaminophen  (TYLENOL ) 500 MG  tablet Take 1,500 mg by mouth daily as needed for mild pain or headache.     albuterol  (PROVENTIL ) (2.5 MG/3ML) 0.083% nebulizer solution Take 3 mLs (2.5 mg total) by nebulization every 6 (six) hours as needed for wheezing or shortness of breath. 180 mL 1   aspirin  EC 81 MG tablet Take 81 mg by mouth daily. Swallow whole.     Blood Glucose Monitoring Suppl (ACCU-CHEK GUIDE) w/Device KIT use as directed 1 kit 0   carvedilol  (COREG ) 6.25 MG tablet Take 1 tablet (6.25 mg total) by mouth 2 (two) times daily with a meal. 180 tablet 1   clotrimazole  (LOTRIMIN ) 1 % cream Apply to affected area 2 times daily 15 g 0   diclofenac  Sodium (VOLTAREN ) 1 % GEL Apply 2 g topically 4 (four) times daily. For JOINT PAIN 200 g 1   doxycycline  (VIBRAMYCIN ) 100 MG capsule Take 1 capsule (100 mg total) by mouth 2 (two) times daily. 14 capsule 0   Dulaglutide  (TRULICITY ) 4.5 MG/0.5ML SOAJ Inject 4.5 mg as directed once a week. 6 mL 1   empagliflozin  (JARDIANCE ) 10 MG TABS tablet Take 1 tablet (10 mg total) by mouth daily before breakfast. 90 tablet 3   ezetimibe  (ZETIA ) 10 MG tablet Take 1 tablet (10 mg total) by mouth daily. 90 tablet 3  furosemide  (LASIX ) 20 MG tablet Take 1 tablet (20 mg total) by mouth daily. 90 tablet 1   glucose blood (TRUE METRIX BLOOD GLUCOSE TEST) test strip Use to check blood sugars up to 3 times per day 100 each 12   hydrALAZINE  (APRESOLINE ) 50 MG tablet Take 1 tablet (50 mg total) by mouth 3 (three) times daily. 270 tablet 1   insulin  NPH-regular Human (HUMULIN  70/30) (70-30) 100 UNIT/ML injection Inject 42 Units into the skin 2 (two) times daily with a meal. 80 mL 1   Insulin  Syringe-Needle U-100 30G X 5/16 0.5 ML MISC use as directed, to inject into the skin twice a day 200 each 6   Multiple Vitamin (MULTIVITAMIN WITH MINERALS) TABS tablet Take 1 tablet by mouth once a week.      rosuvastatin  (CRESTOR ) 10 MG tablet Take 1 tablet (10 mg total) by mouth daily. 90 tablet 3    sacubitril -valsartan  (ENTRESTO ) 49-51 MG Take 1 tablet by mouth 2 (two) times daily. 60 tablet 3   spironolactone  (ALDACTONE ) 25 MG tablet Take 1 tablet (25 mg total) by mouth daily. 90 tablet 1   triamcinolone  (KENALOG ) 0.025 % ointment Apply 1 Application topically 2 (two) times daily. Apply to left side of face 60 g 1   VENTOLIN  HFA 108 (90 Base) MCG/ACT inhaler Inhale 2 puffs into the lungs every 6 (six) hours as needed for wheezing or shortness of breath. 18 g 2   No current facility-administered medications for this visit.    REVIEW OF SYSTEMS (negative unless checked):   Cardiac:  []  Chest pain or chest pressure? []  Shortness of breath upon activity? []  Shortness of breath when lying flat? []  Irregular heart rhythm?  Vascular:  [x]  Pain in calf, thigh, or hip brought on by walking? [x]  Pain in feet at night that wakes you up from your sleep? []  Blood clot in your veins? []  Leg swelling?  Pulmonary:  []  Oxygen at home? []  Productive cough? []  Wheezing?  Neurologic:  []  Sudden weakness in arms or legs? []  Sudden numbness in arms or legs? []  Sudden onset of difficult speaking or slurred speech? []  Temporary loss of vision in one eye? []  Problems with dizziness?  Gastrointestinal:  []  Blood in stool? []  Vomited blood?  Genitourinary:  []  Burning when urinating? []  Blood in urine?  Psychiatric:  []  Major depression  Hematologic:  []  Bleeding problems? []  Problems with blood clotting?  Dermatologic:  []  Rashes or ulcers?  Constitutional:  []  Fever or chills?  Ear/Nose/Throat:  []  Change in hearing? []  Nose bleeds? []  Sore throat?  Musculoskeletal:  []  Back pain? []  Joint pain? []  Muscle pain?   Physical Examination   Vitals:   02/26/23 1429  BP: 99/68  Pulse: 85  Temp: 98.6 F (37 C)  TempSrc: Temporal  SpO2: 90%  Weight: 186 lb 1.6 oz (84.4 kg)   Body mass index is 37.59 kg/m.  General:  WDWN in NAD; vital signs documented  above Gait: Not observed HENT: WNL, normocephalic Pulmonary: normal non-labored breathing  Cardiac: regular Abdomen: soft, NT, no masses Skin: without rashes Vascular Exam/Pulses: 2+ femoral pulses bilaterally.  Nonpalpable popliteal pulses bilaterally.  Nonpalpable pedal pulses bilaterally.  Dampened monophasic right DP/PT/peroneal Doppler signals. Extremities: without ischemic changes, without gangrene , without cellulitis; without open wounds;  Musculoskeletal: no muscle wasting or atrophy  Neurologic: A&O X 3;  No focal weakness or paresthesias are detected Psychiatric:  The pt has Normal affect.  Non-Invasive Vascular imaging  ABI (02/26/2023) R:  ABI: 0.3 (0.54),  PT: mono DP: mono TBI:  absent L:  ABI: 0.99 (0.72),  PT: tri DP: bi TBI: 0.42   Medical Decision Making   Felicia [name] is a 63 y.o. female who presents for surveillance of PAD  Based on the patient's vascular studies, her ABIs on the left appear improved from 0.72 to 0.99.  Her ABIs on the right have decreased from 0.54 to 0.3.  She has dampened monophasic tibial vessel flow on the right.  She has an absent toe pressure on the right. The patient's claudication in the left lower extremity has not changed since her last visit.  Over the past year her right lower extremity claudication has worsened, so now she can only walk about 30 to 40 yards before she has to stop.  After prolonged periods of standing, the patient also experiences severe pain and numbness in the right lower leg and foot. The patient also states over the past couple of months her right foot will go numb and tingly, which will wake her up from her sleep.  She will have to dangle her foot off the bed for relief.  This happens at least 3 nights a week.  I believe this is a sign of early rest pain On exam she has 2+ femoral pulses bilaterally.  She has nonpalpable popliteal or pedal pulses bilaterally.  She has dampened monophasic right DP/PT  Doppler signals.  She has brisk left DP/PT Doppler signals. I believe that the patient would benefit from right lower extremity angiogram, given that her right lower extremity claudication has drastically worsened and she has now started developing signs of early rest pain.  I have explained to the patient she may require intervention on her right lower extremity during her angiogram.  Risk versus benefits discussed.  She is agreeable to this procedure. She will continue her current medications.  We can schedule her for abdominal aortogram with right lower extremity runoff, with possible SFA/popliteal intervention and possible tibial intervention in the next couple of weeks.  I will prescribe her a short course of pain medication for the time being.   Ahmed Holster PA-C [facility] Office: [phone]  Clinic MD: Sheree

## 2023-02-27 ENCOUNTER — Other Ambulatory Visit: Payer: Self-pay

## 2023-02-27 ENCOUNTER — Telehealth: Payer: Self-pay | Admitting: *Deleted

## 2023-02-27 NOTE — Telephone Encounter (Signed)
 Attempted to call for surgery scheduling. LVM

## 2023-02-28 ENCOUNTER — Other Ambulatory Visit: Payer: Self-pay

## 2023-03-01 ENCOUNTER — Emergency Department (HOSPITAL_COMMUNITY)
Admission: EM | Admit: 2023-03-01 | Discharge: 2023-03-01 | Disposition: A | Discharge status: Home / Self Care | Payer: Medicaid Other | Attending: Emergency Medicine | Admitting: Emergency Medicine

## 2023-03-01 ENCOUNTER — Other Ambulatory Visit: Payer: Self-pay

## 2023-03-01 ENCOUNTER — Encounter (HOSPITAL_COMMUNITY): Payer: Self-pay

## 2023-03-01 DIAGNOSIS — E161 Other hypoglycemia: Secondary | ICD-10-CM | POA: Diagnosis not present

## 2023-03-01 DIAGNOSIS — E11649 Type 2 diabetes mellitus with hypoglycemia without coma: Secondary | ICD-10-CM | POA: Diagnosis not present

## 2023-03-01 DIAGNOSIS — R4781 Slurred speech: Secondary | ICD-10-CM | POA: Diagnosis not present

## 2023-03-01 DIAGNOSIS — X58XXXD Exposure to other specified factors, subsequent encounter: Secondary | ICD-10-CM | POA: Diagnosis not present

## 2023-03-01 DIAGNOSIS — I10 Essential (primary) hypertension: Secondary | ICD-10-CM | POA: Diagnosis not present

## 2023-03-01 DIAGNOSIS — Z720 Tobacco use: Secondary | ICD-10-CM | POA: Insufficient documentation

## 2023-03-01 DIAGNOSIS — Z794 Long term (current) use of insulin: Secondary | ICD-10-CM | POA: Insufficient documentation

## 2023-03-01 DIAGNOSIS — R001 Bradycardia, unspecified: Secondary | ICD-10-CM | POA: Diagnosis not present

## 2023-03-01 DIAGNOSIS — S21002D Unspecified open wound of left breast, subsequent encounter: Secondary | ICD-10-CM | POA: Insufficient documentation

## 2023-03-01 DIAGNOSIS — Z7982 Long term (current) use of aspirin: Secondary | ICD-10-CM | POA: Diagnosis not present

## 2023-03-01 DIAGNOSIS — R531 Weakness: Secondary | ICD-10-CM | POA: Diagnosis present

## 2023-03-01 DIAGNOSIS — E162 Hypoglycemia, unspecified: Secondary | ICD-10-CM | POA: Insufficient documentation

## 2023-03-01 LAB — CBC WITH DIFFERENTIAL/PLATELET
Abs Immature Granulocytes: 0.02 10*3/uL (ref 0.00–0.07)
Basophils Absolute: 0 10*3/uL (ref 0.0–0.1)
Basophils Relative: 0 %
Eosinophils Absolute: 0 10*3/uL (ref 0.0–0.5)
Eosinophils Relative: 1 %
HCT: 45.2 % (ref 36.0–46.0)
Hemoglobin: 14.7 g/dL (ref 12.0–15.0)
Immature Granulocytes: 0 %
Lymphocytes Relative: 30 %
Lymphs Abs: 1.8 10*3/uL (ref 0.7–4.0)
MCH: 30.5 pg (ref 26.0–34.0)
MCHC: 32.5 g/dL (ref 30.0–36.0)
MCV: 93.8 fL (ref 80.0–100.0)
Monocytes Absolute: 0.2 10*3/uL (ref 0.1–1.0)
Monocytes Relative: 4 %
Neutro Abs: 4 10*3/uL (ref 1.7–7.7)
Neutrophils Relative %: 65 %
Platelets: 226 10*3/uL (ref 150–400)
RBC: 4.82 MIL/uL (ref 3.87–5.11)
RDW: 16.3 % — ABNORMAL HIGH (ref 11.5–15.5)
WBC: 6.1 10*3/uL (ref 4.0–10.5)
nRBC: 0 % (ref 0.0–0.2)

## 2023-03-01 LAB — URINALYSIS, ROUTINE W REFLEX MICROSCOPIC
Bilirubin Urine: NEGATIVE
Glucose, UA: >=500 mg/dL — AB
Hgb urine dipstick: NEGATIVE
Ketones, ur: NEGATIVE mg/dL
Leukocytes,Ua: NEGATIVE
Nitrite: NEGATIVE
Protein, ur: NEGATIVE mg/dL
Specific Gravity, Urine: 1.012 (ref 1.005–1.030)
pH: 5 (ref 5.0–8.0)

## 2023-03-01 LAB — COMPREHENSIVE METABOLIC PANEL
ALT: 34 U/L (ref 0–44)
AST: 30 U/L (ref 15–41)
Albumin: 3.5 g/dL (ref 3.5–5.0)
Alkaline Phosphatase: 92 U/L (ref 38–126)
Anion gap: 13 (ref 5–15)
BUN: 9 mg/dL (ref 8–23)
CO2: 22 mmol/L (ref 22–32)
Calcium: 9.5 mg/dL (ref 8.9–10.3)
Chloride: 102 mmol/L (ref 98–111)
Creatinine, Ser: 0.76 mg/dL (ref 0.44–1.00)
GFR, Estimated: >60 mL/min (ref >60)
Glucose, Bld: 92 mg/dL (ref 70–99)
Potassium: 4 mmol/L (ref 3.5–5.1)
Sodium: 137 mmol/L (ref 135–145)
Total Bilirubin: 0.5 mg/dL (ref 0.0–1.2)
Total Protein: 6.9 g/dL (ref 6.5–8.1)

## 2023-03-01 LAB — TROPONIN I (HIGH SENSITIVITY)
Troponin I (High Sensitivity): 8 ng/L (ref <18)
Troponin I (High Sensitivity): 9 ng/L (ref <18)

## 2023-03-01 LAB — CBG MONITORING, ED: Glucose-Capillary: 91 mg/dL (ref 70–99)

## 2023-03-01 NOTE — ED Triage Notes (Signed)
 Pt BIB [facility] from home initially called out for stroke symptoms right sided weakness and slurred speech. When EMS arrived CBG was 58. Pt was given 30 grams of oral glucose with no change in CBG. A line was started and 100 ml of D10 was given. Rechecked CBG and it was 131. Stroke symptoms resolved but family states she has had a decreased appetite x1 month.

## 2023-03-01 NOTE — ED Notes (Signed)
 Pt d/c home per EDP order. Discharge summary reviewed, pt verbalizes understanding. Off unit via WC- Discharged home with son. NAD.

## 2023-03-01 NOTE — Discharge Instructions (Signed)
 You were seen in the emergency department for an episode of slurred speech and general weakness in the setting of low blood sugar.  Your symptoms improved with your blood sugar.  Your lab work was otherwise unremarkable.  Please try to keep to a regular meal schedule.  Follow-up with your regular doctor.  Return to the emergency department if any worsening or concerning symptoms

## 2023-03-01 NOTE — ED Provider Notes (Signed)
 [facility] EMERGENCY DEPARTMENT AT [facility] Provider Note   CSN: [id number] Arrival date & time: 03/01/23  9176     History  Chief Complaint  Patient presents with   Hypoglycemia    Felicia [name] is a 63 y.o. female.  She is brought in by EMS after waking up this morning with some slurred speech and some right-sided weakness.  EMS found her blood sugar to be 58 and gave her some sugar with improvement in her neurologic symptoms.  Patient states she feels back to baseline now.  She tells me that she forgot to eat dinner last night because she went to bed early.  She has never had a low blood sugar causing neurologic symptoms before.  She does says she has had a sore under her left breast that she was seen here for a few weeks ago, was put on a course of doxycycline  and some clotrimazole  cream.  She said it is not getting better and is actually getting a little bit worse.  She does not think she has had a fever.  No nausea or vomiting but does have decreased appetite.  The history is provided by the patient and the EMS personnel.  Hypoglycemia Initial blood sugar:  58 Severity:  Moderate Progression:  Resolved Chronicity:  New Diabetic status:  Controlled with oral medications Associated symptoms: altered mental status, speech difficulty and weakness   Associated symptoms: no shortness of breath, no visual change and no vomiting        Home Medications Prior to Admission medications   Medication Sig Start Date End Date Taking? Authorizing Provider  Accu-Chek Softclix Lancets lancets Use as directed to test blood sugars three times a day. 01/02/22   Thukkani, Arun K, MD  acetaminophen  (TYLENOL ) 500 MG tablet Take 1,500 mg by mouth daily as needed for mild pain or headache.    [provider]  albuterol  (PROVENTIL ) (2.5 MG/3ML) 0.083% nebulizer solution Take 3 mLs (2.5 mg total) by nebulization every 6 (six) hours as needed for wheezing or shortness of breath.  05/15/22   Newlin, Enobong, MD  aspirin  EC 81 MG tablet Take 81 mg by mouth daily. Swallow whole.    [provider]  Blood Glucose Monitoring Suppl (ACCU-CHEK GUIDE) w/Device KIT use as directed 01/02/22   Thukkani, Arun K, MD  carvedilol  (COREG ) 6.25 MG tablet Take 1 tablet (6.25 mg total) by mouth 2 (two) times daily with a meal. 12/17/22   Theotis Haze ORN, NP  clotrimazole  (LOTRIMIN ) 1 % cream Apply to affected area 2 times daily 02/14/23   Doretha Folks, MD  diclofenac  Sodium (VOLTAREN ) 1 % GEL Apply 2 g topically 4 (four) times daily. For JOINT PAIN 12/17/22   Theotis Haze ORN, NP  doxycycline  (VIBRAMYCIN ) 100 MG capsule Take 1 capsule (100 mg total) by mouth 2 (two) times daily. 02/14/23   Doretha Folks, MD  Dulaglutide  (TRULICITY ) 4.5 MG/0.5ML SOAJ Inject 4.5 mg as directed once a week. 12/17/22   Fleming, Zelda W, NP  empagliflozin  (JARDIANCE ) 10 MG TABS tablet Take 1 tablet (10 mg total) by mouth daily before breakfast. 01/10/23   Thukkani, Arun K, MD  ezetimibe  (ZETIA ) 10 MG tablet Take 1 tablet (10 mg total) by mouth daily. 02/24/23   Fleming, Zelda W, NP  furosemide  (LASIX ) 20 MG tablet Take 1 tablet (20 mg total) by mouth daily. 01/04/22   Fleming, Zelda W, NP  glucose blood (TRUE METRIX BLOOD GLUCOSE TEST) test strip Use to check  blood sugars up to 3 times per day 01/02/22   Thukkani, Arun K, MD  hydrALAZINE  (APRESOLINE ) 50 MG tablet Take 1 tablet (50 mg total) by mouth 3 (three) times daily. 02/24/23   Fleming, Zelda W, NP  insulin  NPH-regular Human (HUMULIN  70/30) (70-30) 100 UNIT/ML injection Inject 42 Units into the skin 2 (two) times daily with a meal. 12/17/22   Theotis Haze ORN, NP  Insulin  Syringe-Needle U-100 30G X 5/16 0.5 ML MISC use as directed, to inject into the skin twice a day 04/11/21   Fleming, Zelda W, NP  Multiple Vitamin (MULTIVITAMIN WITH MINERALS) TABS tablet Take 1 tablet by mouth once a week.     [provider]  oxyCODONE  (ROXICODONE ) 5 MG  immediate release tablet Take 1 tablet (5 mg total) by mouth every 6 (six) hours as needed for severe pain (pain score 7-10). 02/26/23   Schuh, McKenzi P, PA-C  rosuvastatin  (CRESTOR ) 10 MG tablet Take 1 tablet (10 mg total) by mouth daily. 12/17/22   Fleming, Zelda W, NP  sacubitril -valsartan  (ENTRESTO ) 49-51 MG Take 1 tablet by mouth 2 (two) times daily. 12/02/22   Wyn Jackee VEAR Mickey., NP  spironolactone  (ALDACTONE ) 25 MG tablet Take 1 tablet (25 mg total) by mouth daily. 12/17/22   Fleming, Zelda W, NP  triamcinolone  (KENALOG ) 0.025 % ointment Apply 1 Application topically 2 (two) times daily. Apply to left side of face 12/17/22   Theotis Haze ORN, NP  VENTOLIN  HFA 108 (90 Base) MCG/ACT inhaler Inhale 2 puffs into the lungs every 6 (six) hours as needed for wheezing or shortness of breath. 05/16/22   Theotis Haze ORN, NP      Allergies    Banana and Latex    Review of Systems   Review of Systems  Constitutional:  Negative for fever.  Eyes:  Negative for visual disturbance.  Respiratory:  Negative for shortness of breath.   Gastrointestinal:  Negative for vomiting.  Genitourinary:  Negative for dysuria.  Skin:  Positive for wound.  Neurological:  Positive for speech difficulty and weakness. Negative for headaches.    Physical Exam Updated Vital Signs BP (!) 162/87 (BP Location: Right Arm)   Pulse 79   Temp (!) 97.4 F (36.3 C) (Oral)   Resp 18   Ht 4' 9 (1.448 m)   Wt 85.3 kg   LMP 08/02/2010   SpO2 97%   BMI 40.68 kg/m  Physical Exam Vitals and nursing note reviewed.  Constitutional:      General: She is not in acute distress.    Appearance: Normal appearance. She is well-developed.  HENT:     Head: Normocephalic and atraumatic.  Eyes:     Conjunctiva/sclera: Conjunctivae normal.  Cardiovascular:     Rate and Rhythm: Normal rate and regular rhythm.     Heart sounds: No murmur heard. Pulmonary:     Effort: Pulmonary effort is normal. No respiratory distress.     Breath  sounds: Normal breath sounds.  Chest:    Abdominal:     Palpations: Abdomen is soft.     Tenderness: There is no abdominal tenderness. There is no guarding or rebound.  Musculoskeletal:        General: No swelling.     Cervical back: Neck supple.  Skin:    General: Skin is warm and dry.     Capillary Refill: Capillary refill takes less than 2 seconds.  Neurological:     General: No focal deficit present.  Mental Status: She is alert and oriented to person, place, and time.     Cranial Nerves: No cranial nerve deficit.     Sensory: No sensory deficit.     Motor: No weakness.     ED Results / Procedures / Treatments   Labs (all labs ordered are listed, but only abnormal results are displayed) Labs Reviewed  CBC WITH DIFFERENTIAL/PLATELET - Abnormal; Notable for the following components:      Result Value   RDW 16.3 (*)    All other components within normal limits  URINALYSIS, ROUTINE W REFLEX MICROSCOPIC - Abnormal; Notable for the following components:   APPearance HAZY (*)    Glucose, UA >=500 (*)    Bacteria, UA FEW (*)    All other components within normal limits  COMPREHENSIVE METABOLIC PANEL  CBG MONITORING, ED  TROPONIN I (HIGH SENSITIVITY)  TROPONIN I (HIGH SENSITIVITY)    EKG None  Radiology No results found.  Procedures Procedures    Medications Ordered in ED Medications - No data to display  ED Course/ Medical Decision Making/ A&P Clinical Course as of 03/01/23 1717  Sat Mar 01, 2023  1241 Patient is back to baseline per herself and her family member.  Her workup has been unremarkable.  Her neurologic symptoms have resolved once her blood sugar had normalized and she is back to baseline.  Do not feel this needs further stroke workup at this time.  Recommended close follow-up with PCP.  Return instructions discussed [MB]    Clinical Course User Index [MB] Towana Ozell BROCKS, MD                                 Medical Decision Making Amount  and/or Complexity of Data Reviewed Labs: ordered.   This patient complains of slurred speech and weakness; this involves an extensive number of treatment Options and is a complaint that carries with it a high risk of complications and morbidity. The differential includes stroke, bleed, hypoglycemia, metabolic derangement  I ordered, reviewed and interpreted labs, which included CBC normal chemistries LFTs normal urinalysis without clear signs of infection troponins flat  Additional history obtained from EMS and patient's family members Previous records obtained and reviewed in epic including recent ED visit Cardiac monitoring reviewed, sinus rhythm Social determinants considered, tobacco use Critical Interventions: None  After the interventions stated above, I reevaluated the patient and found patient to be back to baseline and tolerating p.o. Admission and further testing considered, no indications for admission at this time.  Recommended close follow-up with PCP.  Return instructions discussed         Final Clinical Impression(s) / ED Diagnoses Final diagnoses:  Hypoglycemia  Wound of left breast, subsequent encounter    Rx / DC Orders ED Discharge Orders     None         Towana Ozell BROCKS, MD 03/01/23 1719

## 2023-03-01 NOTE — ED Notes (Signed)
Walked patient to the bathroom patient did well 

## 2023-03-03 ENCOUNTER — Other Ambulatory Visit: Payer: Self-pay | Admitting: *Deleted

## 2023-03-03 ENCOUNTER — Encounter: Payer: Self-pay | Admitting: *Deleted

## 2023-03-03 DIAGNOSIS — I70221 Atherosclerosis of native arteries of extremities with rest pain, right leg: Secondary | ICD-10-CM

## 2023-03-11 NOTE — Progress Notes (Deleted)
 Cardiology Office Note    Patient Name: Felicia [name] Date of Encounter: 03/11/2023  Primary Care Provider:  Claiborne Rigg, NP Primary Cardiologist:  [name], MD Primary Electrophysiologist: None   Past Medical History    Past Medical History:  Diagnosis Date   Cardiomyopathy    CHF (congestive heart failure) (HCC)    minamal   COPD (chronic obstructive pulmonary disease) (HCC)    DDD (degenerative disc disease), lumbar    Diabetes mellitus    Hypertension    Non compliance w medication regimen    Normal echocardiogram    LVEF 25-30% 03/27/2010    History of Present Illness  Felicia [name] is a 63 y.o. female with a PMH of mild CAD, HFrEF, COPD, IDDM, PAD, HTN, NICM, tobacco abuse who presents today for 61-month follow-up.  Felicia [name] was last seen on 12/02/2022 for annual follow-up.  During visit she reported leg pain and right calf pain that occurred with walking and had worsening over the last 6 months.  Her blood pressure during visit was well-controlled and she was euvolemic on exam.  She had previously discontinued Entresto due to patient's assistant program labs and was restarted on 49/51 mg twice daily.  She underwent ABIs that showed severe right lower extremity PAD with normal left brachial index with abnormal toe brachial index on the left.  She was referred to Dr. Randie Heinz for further evaluation and is scheduled to undergo an abdominal aortogram on 03/17/2023.   During today's visit the patient reports*** .  Patient denies chest pain, palpitations, dyspnea, PND, orthopnea, nausea, vomiting, dizziness, syncope, edema, weight gain, or early satiety.  ***Notes: -Last ischemic evaluation: -Last echo: -Interim ED visits: Review of Systems  Please see the history of present illness.    All other systems reviewed and are otherwise negative except as noted above.  Physical Exam    Wt Readings from Last 3 Encounters:  03/01/23 188 lb (85.3 kg)  02/26/23  186 lb 1.6 oz (84.4 kg)  02/14/23 190 lb (86.2 kg)   EA:VWUJW were no vitals filed for this visit.,There is no height or weight on file to calculate BMI. GEN: Well nourished, well developed in no acute distress Neck: No JVD; No carotid bruits Pulmonary: Clear to auscultation without rales, wheezing or rhonchi  Cardiovascular: Normal rate. Regular rhythm. Normal S1. Normal S2.   Murmurs: There is no murmur.  ABDOMEN: Soft, non-tender, non-distended EXTREMITIES:  No edema; No deformity   EKG/LABS/ Recent Cardiac Studies   ECG personally reviewed by me today - ***  Risk Assessment/Calculations:   {Does this patient have ATRIAL FIBRILLATION?:[id number]}      Lab Results  Component Value Date   WBC 6.1 03/01/2023   HGB 14.7 03/01/2023   HCT 45.2 03/01/2023   MCV 93.8 03/01/2023   PLT 226 03/01/2023   Lab Results  Component Value Date   CREATININE 0.76 03/01/2023   BUN 9 03/01/2023   NA 137 03/01/2023   K 4.0 03/01/2023   CL 102 03/01/2023   CO2 22 03/01/2023   Lab Results  Component Value Date   CHOL 97 (L) 12/27/2021   HDL 43 12/27/2021   LDLCALC 34 12/27/2021   TRIG 109 12/27/2021   CHOLHDL 2.3 12/27/2021    Lab Results  Component Value Date   HGBA1C 7.1 (A) 12/17/2022   Assessment & Plan    1.HFimEF/NICM: -Most recent 2D echo completed 08/2021 with EF of 55 to 60% with no RWMA and moderate  concentric LVH trivial MR  2.  Peripheral artery disease:  3.  Essential HTN:  4.  Nonobstructive CAD:  5.  DM type II:      Disposition: Follow-up with [name], MD or APP in *** months {Are you ordering a CV Procedure (e.g. stress test, cath, DCCV, TEE, etc)?   Press F2        :[id number]}   Signed, Napoleon Form, Leodis Rains, NP 03/11/2023, 1:27 PM [facility]

## 2023-03-12 ENCOUNTER — Ambulatory Visit: Payer: Medicaid Other | Admitting: Nurse Practitioner

## 2023-03-14 ENCOUNTER — Other Ambulatory Visit: Payer: Self-pay

## 2023-03-17 ENCOUNTER — Other Ambulatory Visit: Payer: Self-pay

## 2023-03-17 ENCOUNTER — Ambulatory Visit (HOSPITAL_COMMUNITY)
Admission: RE | Admit: 2023-03-17 | Discharge: 2023-03-17 | Disposition: A | Discharge status: Home / Self Care | Payer: Medicaid Other | Attending: Vascular Surgery | Admitting: Vascular Surgery

## 2023-03-17 ENCOUNTER — Encounter (HOSPITAL_COMMUNITY)
Admission: RE | Disposition: A | Discharge status: Home / Self Care | Payer: Self-pay | Source: Home / Self Care | Attending: Vascular Surgery

## 2023-03-17 DIAGNOSIS — F17210 Nicotine dependence, cigarettes, uncomplicated: Secondary | ICD-10-CM | POA: Diagnosis not present

## 2023-03-17 DIAGNOSIS — Z7982 Long term (current) use of aspirin: Secondary | ICD-10-CM | POA: Insufficient documentation

## 2023-03-17 DIAGNOSIS — Z79899 Other long term (current) drug therapy: Secondary | ICD-10-CM | POA: Diagnosis not present

## 2023-03-17 DIAGNOSIS — I70221 Atherosclerosis of native arteries of extremities with rest pain, right leg: Secondary | ICD-10-CM | POA: Insufficient documentation

## 2023-03-17 HISTORY — PX: PERIPHERAL VASCULAR BALLOON ANGIOPLASTY: CATH118281

## 2023-03-17 HISTORY — PX: PERIPHERAL VASCULAR ATHERECTOMY: CATH118256

## 2023-03-17 HISTORY — PX: ABDOMINAL AORTOGRAM W/LOWER EXTREMITY: CATH118223

## 2023-03-17 LAB — POCT I-STAT, CHEM 8
BUN: 15 mg/dL (ref 8–23)
Calcium, Ion: 1.21 mmol/L (ref 1.15–1.40)
Chloride: 103 mmol/L (ref 98–111)
Creatinine, Ser: 0.9 mg/dL (ref 0.44–1.00)
Glucose, Bld: 134 mg/dL — ABNORMAL HIGH (ref 70–99)
HCT: 45 % (ref 36.0–46.0)
Hemoglobin: 15.3 g/dL — ABNORMAL HIGH (ref 12.0–15.0)
Potassium: 4.2 mmol/L (ref 3.5–5.1)
Sodium: 140 mmol/L (ref 135–145)
TCO2: 27 mmol/L (ref 22–32)

## 2023-03-17 LAB — GLUCOSE, CAPILLARY: Glucose-Capillary: 88 mg/dL (ref 70–99)

## 2023-03-17 SURGERY — ABDOMINAL AORTOGRAM W/LOWER EXTREMITY
Anesthesia: LOCAL

## 2023-03-17 MED ORDER — MORPHINE SULFATE (PF) 2 MG/ML IV SOLN: 2.0000 mg | INTRAVENOUS | Status: DC | PRN

## 2023-03-17 MED ORDER — SODIUM CHLORIDE 0.9% FLUSH: 3.0000 mL | Freq: Two times a day (BID) | INTRAVENOUS | Status: DC

## 2023-03-17 MED ORDER — FENTANYL CITRATE (PF) 100 MCG/2ML IJ SOLN
INTRAMUSCULAR | Status: AC
  Filled 2023-03-17: qty 2

## 2023-03-17 MED ORDER — HEPARIN (PORCINE) IN NACL 1000-0.9 UT/500ML-% IV SOLN
INTRAVENOUS | Status: DC | PRN
  Administered 2023-03-17: 500 mL

## 2023-03-17 MED ORDER — LIDOCAINE HCL (PF) 1 % IJ SOLN
INTRAMUSCULAR | Status: AC
  Filled 2023-03-17: qty 30

## 2023-03-17 MED ORDER — CLOPIDOGREL BISULFATE 300 MG PO TABS
ORAL_TABLET | ORAL | Status: DC | PRN
  Administered 2023-03-17: 300 mg via ORAL

## 2023-03-17 MED ORDER — HEPARIN SODIUM (PORCINE) 1000 UNIT/ML IJ SOLN
INTRAMUSCULAR | Status: DC | PRN
  Administered 2023-03-17: 5000 [IU] via INTRAVENOUS

## 2023-03-17 MED ORDER — FENTANYL CITRATE (PF) 100 MCG/2ML IJ SOLN
INTRAMUSCULAR | Status: DC | PRN
  Administered 2023-03-17: 50 ug via INTRAVENOUS

## 2023-03-17 MED ORDER — HEPARIN SODIUM (PORCINE) 1000 UNIT/ML IJ SOLN
INTRAMUSCULAR | Status: AC
  Filled 2023-03-17: qty 10

## 2023-03-17 MED ORDER — SODIUM CHLORIDE 0.9 % IV SOLN: 250.0000 mL | INTRAVENOUS | Status: DC | PRN

## 2023-03-17 MED ORDER — LABETALOL HCL 5 MG/ML IV SOLN: 10.0000 mg | INTRAVENOUS | Status: DC | PRN

## 2023-03-17 MED ORDER — SODIUM CHLORIDE 0.9 % WEIGHT BASED INFUSION: 1.0000 mL/kg/h | INTRAVENOUS | Status: DC

## 2023-03-17 MED ORDER — SODIUM CHLORIDE 0.9% FLUSH: 3.0000 mL | INTRAVENOUS | Status: DC | PRN

## 2023-03-17 MED ORDER — OXYCODONE HCL 5 MG PO TABS: 5.0000 mg | ORAL_TABLET | ORAL | Status: DC | PRN

## 2023-03-17 MED ORDER — MIDAZOLAM HCL 2 MG/2ML IJ SOLN
INTRAMUSCULAR | Status: DC | PRN
  Administered 2023-03-17: 1 mg via INTRAVENOUS

## 2023-03-17 MED ORDER — ONDANSETRON HCL 4 MG/2ML IJ SOLN: 4.0000 mg | Freq: Four times a day (QID) | INTRAMUSCULAR | Status: DC | PRN

## 2023-03-17 MED ORDER — MIDAZOLAM HCL 2 MG/2ML IJ SOLN
INTRAMUSCULAR | Status: AC
  Filled 2023-03-17: qty 2

## 2023-03-17 MED ORDER — SODIUM CHLORIDE 0.9 % IV SOLN
INTRAVENOUS | Status: DC
  Administered 2023-03-17: 100 mL/h via INTRAVENOUS

## 2023-03-17 MED ORDER — LIDOCAINE HCL (PF) 1 % IJ SOLN
INTRAMUSCULAR | Status: DC | PRN
Start: 2023-03-17 — End: 2023-03-17
  Administered 2023-03-17: 5 mL via INTRADERMAL

## 2023-03-17 MED ORDER — ACETAMINOPHEN 325 MG PO TABS: 650.0000 mg | ORAL_TABLET | ORAL | Status: DC | PRN

## 2023-03-17 MED ORDER — CLOPIDOGREL BISULFATE 300 MG PO TABS
ORAL_TABLET | ORAL | Status: AC
  Filled 2023-03-17: qty 1

## 2023-03-17 MED ORDER — HYDRALAZINE HCL 20 MG/ML IJ SOLN: 5.0000 mg | INTRAMUSCULAR | Status: DC | PRN

## 2023-03-17 MED ORDER — CLOPIDOGREL BISULFATE 75 MG PO TABS
75.0000 mg | ORAL_TABLET | Freq: Every day | ORAL | 11 refills | Status: AC
  Filled 2023-03-17: qty 30, 30d supply, fill #0
  Filled 2023-04-17: qty 30, 30d supply, fill #1
  Filled 2023-07-17: qty 30, 30d supply, fill #2
  Filled 2023-09-01: qty 30, 30d supply, fill #3
  Filled 2023-09-24 – 2023-10-06 (×2): qty 30, 30d supply, fill #4
  Filled 2023-11-18 – 2023-12-04 (×2): qty 30, 30d supply, fill #5
  Filled 2024-02-09: qty 30, 30d supply, fill #6

## 2023-03-17 SURGICAL SUPPLY — 22 items
BALLN MUSTANG 5X150X135 (BALLOONS) ×3 IMPLANT
BALLOON MUSTANG 5X150X135 (BALLOONS) IMPLANT
CATH AURYON ATHERECTOMY 2.0 (CATHETERS) IMPLANT
CATH AURYON ATHREC 1.7 (CATHETERS) IMPLANT
CATH OMNI FLUSH 5F 65CM (CATHETERS) IMPLANT
CATH QUICKCROSS SUPP .035X90CM (MICROCATHETER) IMPLANT
CLOSURE MYNX CONTROL 6F/7F (Vascular Products) IMPLANT
DCB RANGER 5.0X150 150 (BALLOONS) IMPLANT
GLIDEWIRE ADV .035X260CM (WIRE) IMPLANT
KIT ENCORE 26 ADVANTAGE (KITS) IMPLANT
KIT MICROPUNCTURE NIT STIFF (SHEATH) IMPLANT
KIT SINGLE USE MANIFOLD (KITS) IMPLANT
RANGER DCB 5.0X150 150 (BALLOONS) ×3 IMPLANT
SET ATX-X65L (MISCELLANEOUS) IMPLANT
SHEATH CATAPULT 6FR 45 (SHEATH) IMPLANT
SHEATH PINNACLE 5F 10CM (SHEATH) IMPLANT
SHEATH PINNACLE 6F 10CM (SHEATH) IMPLANT
SHEATH PROBE COVER 6X72 (BAG) IMPLANT
TRAY PV CATH (CUSTOM PROCEDURE TRAY) ×3 IMPLANT
WIRE BENTSON .035X145CM (WIRE) IMPLANT
WIRE G V18X300CM (WIRE) IMPLANT
WIRE SPARTACORE .014X300CM (WIRE) IMPLANT

## 2023-03-17 NOTE — Op Note (Signed)
 Patient name: Felicia [name] MRN: [id number] DOB: 08-31-1960 Sex: female  03/17/2023 Pre-operative Diagnosis: Atherosclerosis native arteries right lower extremity with rest pain Post-operative diagnosis:  Same Surgeon:  Luanna Salk. Randie Heinz, MD Procedure Performed: 1.  Percutaneous ultrasound-guided cannulation and Mynx device closure left common femoral artery 2.  Aortogram with bilateral lower extremity angiography 3.  Laser arthrectomy of left SFA and popliteal artery with 1.7 mm A[place]on 4.  Drug-coated balloon angioplasty with 5 mm Ranger right SFA and popliteal artery 5.  Moderate sedation with fentanyl and Versed for 66 minutes  Indications: 63 year old female with history of short distance claudication now with rest pain of the right lower extremity greater than the left.  She is actually asymptomatic on the left side most of the time.  She can only walk about 50 yards without stopping but does not have any tissue loss.  She is indicated for angiography with possible invention of the right lower extremity.  Findings: The aorta and iliac segments did have calcium particular at the bifurcation but there was no flow-limiting stenosis and both hypogastric arteries are patent.  Bilateral common femoral arteries and proximal SFAs were patent with nonflow limiting calcification.  On the right side the distal SFA and popliteal arteries were subtotally occluded at least 95% stenosis with heavy calcium above the knee with three-vessel runoff to the foot.  After treatment of the SFA and popliteal segment with laser atherectomy and drug-coated balloon angioplasty there is 0% residual stenosis and no dissection noted.  There is three-vessel runoff to the foot and a palpable dorsalis pedis with very strong multiphasic posterior tibial signal.   Procedure:  The patient was identified in the holding area and taken to room 8.  The patient was then placed supine on the table and prepped and draped in the  usual sterile fashion.  A time out was called.  Ultrasound was used to evaluate the left common femoral artery which was patent.  The area was anesthetized 1% lidocaine cannulated with micropuncture needle followed by wire and a sheath.  Concomitantly moderate sedation was administered with fentanyl and Versed and her vital signs were monitored by bedside nursing throughout the case.  We placed a Bentson wire followed by 5 [place] sheath and Omni catheter to the level of L1 and aortogram was performed followed by bilateral extremity angiography to include the bilateral common femoral arteries and proximal SFAs and profundus which were both patent.  We then crossed the bifurcation with Glidewire advantage and Omni cath and performed right lower extremity angiography of the common femoral approach.  We then placed the Glidewire advantage and a long 6 French sheath the patient was fully heparinized.  We crossed the subtotally occluded segment with quick cross catheter and Glidewire advantage confirmed intraluminal access placed an 014 wire and attempted laser atherectomy with a 2.0 mm laser but then exchanged for an 018 as this would not cross the bifurcation and perform 1.7 mm laser arthrectomy of the SFA and popliteal segment at both high and low power.  We then balloon angioplasty with 5 mm balloon and completion demonstrated no residual stenosis or dissection and as such we placed a 5 mm Ranger and inflated this at nominal pressure for 3 minutes.  Completion again demonstrated no residual stenosis or dissection and there was brisk runoff distally filling all 3 tibial vessels.  Satisfied with this we exchanged for a short 6 French sheath and deployed a minx device in the left common femoral artery  after performing retrograde angiography.  The patient tolerated the procedure without any complication and there was a palpable dorsalis pedis at completion.  Contrast: 90cc  Najla Aughenbaugh C. Randie Heinz, MD [facility] Office: [phone] Pager: [phone]

## 2023-03-17 NOTE — Progress Notes (Signed)
 Patient walked to the bathroom without difficulties. Left groin level 0, clean, dry, and intact.

## 2023-03-17 NOTE — Discharge Instructions (Signed)
 Femoral Site Care This sheet gives you information about how to care for yourself after your procedure. Your health care provider may also give you more specific instructions. If you have problems or questions, contact your health care provider. What can I expect after the procedure?  After the procedure, it is common to have: Bruising that usually fades within 1-2 weeks. Tenderness at the site. Follow these instructions at home: Wound care Follow instructions from your health care provider about how to take care of your insertion site. Make sure you: Wash your hands with soap and water before you change your bandage (dressing). If soap and water are not available, use hand sanitizer. Remove your dressing as told by your health care provider. In 24 hours Do not take baths, swim, or use a hot tub until your health care provider approves. You may shower 24-48 hours after the procedure or as told by your health care provider. Gently wash the site with plain soap and water. Pat the area dry with a clean towel. Do not rub the site. This may cause bleeding. Do not apply powder or lotion to the site. Keep the site clean and dry. Check your femoral site every day for signs of infection. Check for: Redness, swelling, or pain. Fluid or blood. Warmth. Pus or a bad smell. Activity For the first 2-3 days after your procedure, or as long as directed: Avoid climbing stairs as much as possible. Do not squat. Do not lift anything that is heavier than 10 lb (4.5 kg), or the limit that you are told, until your health care provider says that it is safe. For 5 days Rest as directed. Avoid sitting for a long time without moving. Get up to take short walks every 1-2 hours. Do not drive for 24 hours if you were given a medicine to help you relax (sedative). General instructions Take over-the-counter and prescription medicines only as told by your health care provider. Keep all follow-up visits as told by  your health care provider. This is important. Contact a health care provider if you have: A fever or chills. You have redness, swelling, or pain around your insertion site. Get help right away if: The catheter insertion area swells very fast. You pass out. You suddenly start to sweat or your skin gets clammy. The catheter insertion area is bleeding, and the bleeding does not stop when you hold steady pressure on the area. The area near or just beyond the catheter insertion site becomes pale, cool, tingly, or numb. These symptoms may represent a serious problem that is an emergency. Do not wait to see if the symptoms will go away. Get medical help right away. Call your local emergency services (911 in the [place] Do not drive yourself to the hospital. Summary After the procedure, it is common to have bruising that usually fades within 1-2 weeks. Check your femoral site every day for signs of infection. Do not lift anything that is heavier than 10 lb (4.5 kg), or the limit that you are told, until your health care provider says that it is safe. This information is not intended to replace advice given to you by your health care provider. Make sure you discuss any questions you have with your health care provider. Document Revised: 01/20/2017 Document Reviewed: 01/20/2017 Elsevier Patient Education  2020 [employer].

## 2023-03-17 NOTE — Interval H&P Note (Signed)
 History and Physical Interval Note:  03/17/2023 8:27 AM  Felicia [name]  has presented today for surgery, with the diagnosis of ischemia right lower extremity with rest pain.  The various methods of treatment have been discussed with the patient and family. After consideration of risks, benefits and other options for treatment, the patient has consented to  Procedure(s): ABDOMINAL AORTOGRAM W/LOWER EXTREMITY (N/A) as a surgical intervention.  The patient's history has been reviewed, patient examined, no change in status, stable for surgery.  I have reviewed the patient's chart and labs.  Questions were answered to the patient's satisfaction.     Lemar Livings

## 2023-03-18 ENCOUNTER — Other Ambulatory Visit: Payer: Self-pay

## 2023-03-18 ENCOUNTER — Encounter (HOSPITAL_COMMUNITY): Payer: Self-pay | Admitting: Vascular Surgery

## 2023-03-23 NOTE — Progress Notes (Deleted)
 Cardiology Office Note    Patient Name: Felicia [name] Date of Encounter: 03/23/2023  Primary Care Provider:  Claiborne Rigg, NP Primary Cardiologist:  [name], MD Primary Electrophysiologist: None   Past Medical History    Past Medical History:  Diagnosis Date   Cardiomyopathy    CHF (congestive heart failure) (HCC)    minamal   COPD (chronic obstructive pulmonary disease) (HCC)    DDD (degenerative disc disease), lumbar    Diabetes mellitus    Hypertension    Non compliance w medication regimen    Normal echocardiogram    LVEF 25-30% 03/27/2010    History of Present Illness  Felicia [name] is a 63 y.o. female with a PMH of mild CAD, HFrEF, COPD, IDDM, PAD, HTN, NICM, tobacco abuse who presents today for 16-month follow-up.  Felicia [name] was last seen on 12/02/2022 for annual follow-up.  During visit she reported leg pain and right calf pain that occurred with walking and had worsening over the last 6 months.  Her blood pressure during visit was well-controlled and she was euvolemic on exam.  She had previously discontinued Entresto due to patient's assistant program labs and was restarted on 49/51 mg twice daily.  She underwent ABIs that showed severe right lower extremity PAD with normal left brachial index with abnormal toe brachial index on the left.  She was referred to Dr. Randie Heinz for further evaluation and is scheduled to undergo an abdominal aortogram on 03/17/2023.   During today's visit the patient reports*** .  Patient denies chest pain, palpitations, dyspnea, PND, orthopnea, nausea, vomiting, dizziness, syncope, edema, weight gain, or early satiety.  ***Notes: -Last ischemic evaluation: -Last echo: -Interim ED visits: Review of Systems  Please see the history of present illness.    All other systems reviewed and are otherwise negative except as noted above.  Physical Exam    Wt Readings from Last 3 Encounters:  03/17/23 188 lb (85.3 kg)  03/01/23  188 lb (85.3 kg)  02/26/23 186 lb 1.6 oz (84.4 kg)   ZO:XWRUE were no vitals filed for this visit.,There is no height or weight on file to calculate BMI. GEN: Well nourished, well developed in no acute distress Neck: No JVD; No carotid bruits Pulmonary: Clear to auscultation without rales, wheezing or rhonchi  Cardiovascular: Normal rate. Regular rhythm. Normal S1. Normal S2.   Murmurs: There is no murmur.  ABDOMEN: Soft, non-tender, non-distended EXTREMITIES:  No edema; No deformity   EKG/LABS/ Recent Cardiac Studies   ECG personally reviewed by me today - ***  Risk Assessment/Calculations:   {Does this patient have ATRIAL FIBRILLATION?:[id number]}      Lab Results  Component Value Date   WBC 6.1 03/01/2023   HGB 15.3 (H) 03/17/2023   HCT 45.0 03/17/2023   MCV 93.8 03/01/2023   PLT 226 03/01/2023   Lab Results  Component Value Date   CREATININE 0.90 03/17/2023   BUN 15 03/17/2023   NA 140 03/17/2023   K 4.2 03/17/2023   CL 103 03/17/2023   CO2 22 03/01/2023   Lab Results  Component Value Date   CHOL 97 (L) 12/27/2021   HDL 43 12/27/2021   LDLCALC 34 12/27/2021   TRIG 109 12/27/2021   CHOLHDL 2.3 12/27/2021    Lab Results  Component Value Date   HGBA1C 7.1 (A) 12/17/2022   Assessment & Plan    1.HFimEF/NICM: -Most recent 2D echo completed 08/2021 with EF of 55 to 60% with no RWMA and  moderate concentric LVH trivial MR  2.  Peripheral artery disease:  3.  Essential HTN:  4.  Nonobstructive CAD:  5.  DM type II:      Disposition: Follow-up with [name], MD or APP in *** months {Are you ordering a CV Procedure (e.g. stress test, cath, DCCV, TEE, etc)?   Press F2        :[id number]}   Signed, Napoleon Form, Leodis Rains, NP 03/23/2023, 2:47 PM [facility]

## 2023-03-24 ENCOUNTER — Ambulatory Visit: Payer: Medicaid Other | Admitting: Nurse Practitioner

## 2023-03-24 DIAGNOSIS — I2510 Atherosclerotic heart disease of native coronary artery without angina pectoris: Secondary | ICD-10-CM

## 2023-03-24 DIAGNOSIS — I428 Other cardiomyopathies: Secondary | ICD-10-CM

## 2023-03-24 DIAGNOSIS — I5032 Chronic diastolic (congestive) heart failure: Secondary | ICD-10-CM

## 2023-03-24 DIAGNOSIS — E1169 Type 2 diabetes mellitus with other specified complication: Secondary | ICD-10-CM

## 2023-03-27 ENCOUNTER — Other Ambulatory Visit: Payer: Self-pay

## 2023-03-27 ENCOUNTER — Encounter: Payer: Self-pay | Admitting: Gastroenterology

## 2023-03-31 ENCOUNTER — Other Ambulatory Visit: Payer: Self-pay

## 2023-04-11 ENCOUNTER — Other Ambulatory Visit: Payer: Self-pay

## 2023-04-11 DIAGNOSIS — I70213 Atherosclerosis of native arteries of extremities with intermittent claudication, bilateral legs: Secondary | ICD-10-CM

## 2023-04-15 NOTE — Progress Notes (Deleted)
 Cardiology Office Note    Patient Name: Felicia [name] Date of Encounter: 04/15/2023  Primary Care Provider:  Collins Dean, NP Primary Cardiologist:  Felicia Phy, MD Primary Electrophysiologist: None   Past Medical History    Past Medical History:  Diagnosis Date   Cardiomyopathy    CHF (congestive heart failure) (HCC)    minamal   COPD (chronic obstructive pulmonary disease) (HCC)    DDD (degenerative disc disease), lumbar    Diabetes mellitus    Hypertension    Non compliance w medication regimen    Normal echocardiogram    LVEF 25-30% 03/27/2010    History of Present Illness  Felicia [name] is a 63 y.o. female with a PMH of mild CAD, HFrEF, COPD, IDDM, PAD, HTN, NICM, tobacco abuse who presents today for 19-month follow-up.  Felicia [name] was last seen on 12/02/2022 for annual follow-up.  During visit she reported leg pain and right calf pain that occurred with walking and had worsening over the last 6 months.  Her blood pressure during visit was well-controlled and she was euvolemic on exam.  She had previously discontinued Entresto  due to patient's assistant program labs and was restarted on 49/51 mg twice daily.  She underwent ABIs that showed severe right lower extremity PAD with normal left brachial index with abnormal toe brachial index on the left.  She was referred to Dr. Vikki [name] for further evaluation and abdominal aortogram demonstrated an subtotal occlusion of distal SFA tibial arteries laser arthrectomy and drug-coated balloon 0% residual stenosis noted.  During today's visit the patient reports*** .  Patient denies chest pain, palpitations, dyspnea, PND, orthopnea, nausea, vomiting, dizziness, syncope, edema, weight gain, or early satiety.  ***Notes: -Last ischemic evaluation: -Last echo: -Interim ED visits: Review of Systems  Please see the history of present illness.    All other systems reviewed and are otherwise negative except as noted  above.  Physical Exam    Wt Readings from Last 3 Encounters:  03/17/23 188 lb (85.3 kg)  03/01/23 188 lb (85.3 kg)  02/26/23 186 lb 1.6 oz (84.4 kg)   WG:NFAOZ were no vitals filed for this visit.,There is no height or weight on file to calculate BMI. GEN: Well nourished, well developed in no acute distress Neck: No JVD; No carotid bruits Pulmonary: Clear to auscultation without rales, wheezing or rhonchi  Cardiovascular: Normal rate. Regular rhythm. Normal S1. Normal S2.   Murmurs: There is no murmur.  ABDOMEN: Soft, non-tender, non-distended EXTREMITIES:  No edema; No deformity   EKG/LABS/ Recent Cardiac Studies   ECG personally reviewed by me today - ***  Risk Assessment/Calculations:   {Does this patient have ATRIAL FIBRILLATION?:[id number]}      Lab Results  Component Value Date   WBC 6.1 03/01/2023   HGB 15.3 (H) 03/17/2023   HCT 45.0 03/17/2023   MCV 93.8 03/01/2023   PLT 226 03/01/2023   Lab Results  Component Value Date   CREATININE 0.90 03/17/2023   BUN 15 03/17/2023   NA 140 03/17/2023   K 4.2 03/17/2023   CL 103 03/17/2023   CO2 22 03/01/2023   Lab Results  Component Value Date   CHOL 97 (L) 12/27/2021   HDL 43 12/27/2021   LDLCALC 34 12/27/2021   TRIG 109 12/27/2021   CHOLHDL 2.3 12/27/2021    Lab Results  Component Value Date   HGBA1C 7.1 (A) 12/17/2022   Assessment & Plan    1.HFimEF/NICM: -Most recent 2D echo completed 08/2021  with EF of 55 to 60% with no RWMA and moderate concentric LVH trivial MR  2.  Peripheral artery disease: -Abdominal aortogram completed on 03/17/2023 with subtotal occlusion of left distal SFA tibial arteries laser arthrectomy and drug-coated balloon 0% residual stenosis noted.  3.  Essential HTN: -Patient's blood pressure today was***  4.  Nonobstructive CAD: -LHC completed 03/2010 with no significant disease noted with 60% ostial stenosis 40% second OM no irregularities in RCA  5.  DM type II: -Patient's last  hemoglobin A1c was***  6.  Hyperlipidemia: -Patient's last LDL cholesterol was***.      Disposition: Follow-up with Felicia K Thukkani, MD or APP in *** months {Are you ordering a CV Procedure (e.g. stress test, cath, DCCV, TEE, etc)?   Press F2        :[id number]}   Signed, Felicia [name], Felicia Cast, NP 04/15/2023, 6:49 PM  [facility]

## 2023-04-16 ENCOUNTER — Ambulatory Visit: Attending: Nurse Practitioner | Admitting: Nurse Practitioner

## 2023-04-16 DIAGNOSIS — I5032 Chronic diastolic (congestive) heart failure: Secondary | ICD-10-CM

## 2023-04-16 DIAGNOSIS — I428 Other cardiomyopathies: Secondary | ICD-10-CM

## 2023-04-16 DIAGNOSIS — E11311 Type 2 diabetes mellitus with unspecified diabetic retinopathy with macular edema: Secondary | ICD-10-CM

## 2023-04-16 DIAGNOSIS — I2510 Atherosclerotic heart disease of native coronary artery without angina pectoris: Secondary | ICD-10-CM

## 2023-04-16 DIAGNOSIS — E1159 Type 2 diabetes mellitus with other circulatory complications: Secondary | ICD-10-CM

## 2023-04-17 ENCOUNTER — Ambulatory Visit: Payer: Medicaid Other | Admitting: Podiatry

## 2023-04-17 ENCOUNTER — Other Ambulatory Visit: Payer: Self-pay | Admitting: Nurse Practitioner

## 2023-04-17 ENCOUNTER — Other Ambulatory Visit: Payer: Self-pay

## 2023-04-17 ENCOUNTER — Encounter: Payer: Self-pay | Admitting: Podiatry

## 2023-04-17 DIAGNOSIS — M79674 Pain in right toe(s): Secondary | ICD-10-CM

## 2023-04-17 DIAGNOSIS — E119 Type 2 diabetes mellitus without complications: Secondary | ICD-10-CM

## 2023-04-17 DIAGNOSIS — B351 Tinea unguium: Secondary | ICD-10-CM

## 2023-04-17 DIAGNOSIS — M79675 Pain in left toe(s): Secondary | ICD-10-CM

## 2023-04-17 DIAGNOSIS — I5022 Chronic systolic (congestive) heart failure: Secondary | ICD-10-CM

## 2023-04-17 NOTE — Progress Notes (Signed)
 This patient presents to the office with chief complaint of long thick deformed big toenails both feet.  She says she had surgery performed years ago and both great toenails were removed and she asked for the nails to regrow.  The nails have returned and are deformed  The nails become painful when the nails are thick.  She says she is diabetic.  She presents for evaluation and treatment.    General Appearance  Alert, conversant and in no acute stress.  Vascular  Dorsalis pedis and posterior tibial  pulses are palpable  bilaterally.  Capillary return is within normal limits  bilaterally. Temperature is within normal limits  bilaterally.  Neurologic  Senn-Weinstein monofilament wire test within normal limits  bilaterally. Muscle power within normal limits bilaterally.  Nails Thick disfigured discolored nails with subungual debris   hallux to fifth toenails  B/L.Marland Kitchen No evidence of bacterial infection or drainage bilaterally.  Orthopedic  No limitations of motion  feet .  No crepitus or effusions noted.  No bony pathology or digital deformities noted.  Skin  normotropic skin with no porokeratosis noted bilaterally.  No signs of infections or ulcers noted.   Onychomycosis  B/L  Debrided nails with nail nipper followed by dremel tool usage.   RTC 3 months.   Helane Gunther DPM

## 2023-04-18 ENCOUNTER — Telehealth: Payer: Self-pay

## 2023-04-18 ENCOUNTER — Other Ambulatory Visit: Payer: Self-pay

## 2023-04-18 ENCOUNTER — Encounter: Payer: Self-pay | Admitting: Nurse Practitioner

## 2023-04-18 ENCOUNTER — Ambulatory Visit: Attending: Nurse Practitioner | Admitting: Nurse Practitioner

## 2023-04-18 VITALS — BP 113/74 | HR 89 | Resp 19 | Ht <= 58 in | Wt 184.6 lb

## 2023-04-18 DIAGNOSIS — E11311 Type 2 diabetes mellitus with unspecified diabetic retinopathy with macular edema: Secondary | ICD-10-CM

## 2023-04-18 DIAGNOSIS — Z794 Long term (current) use of insulin: Secondary | ICD-10-CM | POA: Diagnosis not present

## 2023-04-18 DIAGNOSIS — I5022 Chronic systolic (congestive) heart failure: Secondary | ICD-10-CM | POA: Diagnosis not present

## 2023-04-18 DIAGNOSIS — N611 Abscess of the breast and nipple: Secondary | ICD-10-CM | POA: Diagnosis not present

## 2023-04-18 LAB — POCT GLYCOSYLATED HEMOGLOBIN (HGB A1C): Hemoglobin A1C: 5.7 % — AB (ref 4.0–5.6)

## 2023-04-18 MED ORDER — SPIRONOLACTONE 25 MG PO TABS
25.0000 mg | ORAL_TABLET | Freq: Every day | ORAL | 1 refills | Status: AC
  Filled 2023-04-18 – 2023-07-16 (×2): qty 90, 90d supply, fill #0
  Filled 2023-11-18 – 2023-12-04 (×2): qty 90, 90d supply, fill #1

## 2023-04-18 NOTE — Progress Notes (Signed)
 Assessment & Plan:  Felicia [name] was seen today for medical management of chronic issues.  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 Keep blood sugar logs with fasting goal of 90-130 mg/dl, post prandial (after you eat) less than 180.  For Hypoglycemia: BS <60 and Hyperglycemia BS >400; contact the clinic ASAP. Annual eye exams and foot exams are recommended.   Left breast abscess -     MM 3D DIAGNOSTIC MAMMOGRAM BILATERAL BREAST; Future -     US BREAST COMPLETE UNI LEFT INC AXILLA; Future  Chronic systolic heart failure (HCC) -     spironolactone (ALDACTONE) 25 MG tablet; Take 1 tablet (25 mg total) by mouth daily.    Patient has been counseled on age-appropriate routine health concerns for screening and prevention. These are reviewed and up-to-date. Referrals have been placed accordingly. Immunizations are up-to-date or declined.    Subjective:   Chief Complaint  Patient presents with   Medical Management of Chronic Issues    Felicia [name] 63 y.o. female presents to office today for follow up to diabetes   She was seen for open wound of left breast in the emergency room on February 14, 2023.  She at that time she was prescribed clotrimazole and doxycycline.  Today she states the wound is still slow healing as she will not be able to make her appointment with the wound care center on Monday and will need to reschedule.  Her mammogram is overdue and she was lost to follow-up for the mammogram that was ordered last year in March.  At this time we will order diagnostic  mammogram and left breast ultrasound.  She denies any family history of breast cancer.  She has a past medical history of Cardiomyopathy, CHF, COPD, DDD, lumbar, Diabetes mellitus, Hypertension, Non compliance w medication regimen, PAD, and Normal echocardiogram.    DM 2 A1c has improved significantly and now down to 5.7 from 7.1.  Her weight is also trending down.  She is currently taking Trulicity 4.5 mg weekly, Jardiance 10 mg daily, Humulin 70/30 42 units twice daily Lab Results  Component Value Date   HGBA1C 5.7 (A) 04/18/2023    Lab Results  Component Value Date   HGBA1C 7.1 (A) 12/17/2022       Review of Systems  Constitutional:  Negative for fever, malaise/fatigue and weight loss.  HENT: Negative.  Negative for nosebleeds.   Eyes: Negative.  Negative for blurred vision, double vision and photophobia.  Respiratory: Negative.  Negative for cough and shortness of breath.   Cardiovascular: Negative.  Negative for chest pain, palpitations and leg swelling.  Gastrointestinal: Negative.  Negative for heartburn, nausea and vomiting.  Musculoskeletal: Negative.  Negative for myalgias.  Skin:        SEE HPI  Neurological: Negative.  Negative for dizziness, focal weakness, seizures and headaches.  Psychiatric/Behavioral: Negative.  Negative for suicidal ideas.     Past Medical History:  Diagnosis Date   Cardiomyopathy    CHF (congestive heart failure) (HCC)    minamal   COPD (chronic obstructive pulmonary disease) (HCC)  DDD (degenerative disc disease), lumbar    Diabetes mellitus    Hypertension    Non compliance w medication regimen    Normal echocardiogram    LVEF 25-30% 03/27/2010   PAD (peripheral artery disease) [facility])     Past Surgical History:  Procedure Laterality Date   ABDOMINAL AORTOGRAM W/LOWER EXTREMITY N/A 03/17/2023   Procedure: ABDOMINAL AORTOGRAM W/LOWER EXTREMITY;  Surgeon: Maeola Harman, MD;  Location: [facility] INVASIVE CV LAB;  Service:  Cardiovascular;  Laterality: N/A;   CARDIAC CATHETERIZATION     PERIPHERAL VASCULAR ATHERECTOMY  03/17/2023   Procedure: PERIPHERAL VASCULAR ATHERECTOMY;  Surgeon: Maeola Harman, MD;  Location: [facility] INVASIVE CV LAB;  Service: Cardiovascular;;   PERIPHERAL VASCULAR BALLOON ANGIOPLASTY Left 03/17/2023   Procedure: PERIPHERAL VASCULAR BALLOON ANGIOPLASTY;  Surgeon: Maeola Harman, MD;  Location: [facility] INVASIVE CV LAB;  Service: Cardiovascular;  Laterality: Left;  DCB   RIGHT/LEFT HEART CATH AND CORONARY ANGIOGRAPHY N/A 05/29/2017   Procedure: RIGHT/LEFT HEART CATH AND CORONARY ANGIOGRAPHY;  Surgeon: Dolores Patty, MD;  Location: [facility] INVASIVE CV LAB;  Service: Cardiovascular;  Laterality: N/A;   TUBAL LIGATION      Family History  Problem Relation Age of Onset   Stroke Mother    Lung cancer Father    Diabetes Daughter     Social History Reviewed with no changes to be made today.   Outpatient Medications Prior to Visit  Medication Sig Dispense Refill   acetaminophen (TYLENOL) 500 MG tablet Take 1,500 mg by mouth daily as needed for mild pain or headache.     albuterol (PROVENTIL) (2.5 MG/3ML) 0.083% nebulizer solution Take 3 mLs (2.5 mg total) by nebulization every 6 (six) hours as needed for wheezing or shortness of breath. 180 mL 1   aspirin EC 81 MG tablet Take 81 mg by mouth daily. Swallow whole.     carvedilol (COREG) 6.25 MG tablet Take 1 tablet (6.25 mg total) by mouth 2 (two) times daily with a meal. 180 tablet 1   clopidogrel (PLAVIX) 75 MG tablet Take 1 tablet (75 mg total) by mouth daily. 30 tablet 11   clotrimazole (LOTRIMIN) 1 % cream Apply to affected area 2 times daily 15 g 0   diclofenac Sodium (VOLTAREN) 1 % GEL Apply 2 g topically 4 (four) times daily. For JOINT PAIN (Patient taking differently: Apply 2 g topically 4 (four) times daily as needed (For JOINT PAIN).) 200 g 1   Dulaglutide (TRULICITY) 4.5 MG/0.5ML SOAJ Inject 4.5 mg as directed once a week. 6  mL 1   empagliflozin (JARDIANCE) 10 MG TABS tablet Take 1 tablet (10 mg total) by mouth daily before breakfast. 90 tablet 3   ezetimibe (ZETIA) 10 MG tablet Take 1 tablet (10 mg total) by mouth daily. 90 tablet 3   furosemide (LASIX) 20 MG tablet Take 1 tablet (20 mg total) by mouth daily. 90 tablet 1   hydrALAZINE (APRESOLINE) 50 MG tablet Take 1 tablet (50 mg total) by mouth 3 (three) times daily. 270 tablet 1   insulin NPH-regular Human (HUMULIN 70/30) (70-30) 100 UNIT/ML injection Inject 42 Units into the skin 2 (two) times daily with a meal. 80 mL 1   Insulin Syringe-Needle U-100 30G X 5/16" 0.5 ML MISC use as directed, to inject into the skin twice a day 200 each 6   Multiple Vitamin (MULTIVITAMIN WITH MINERALS) TABS tablet Take 1 tablet by mouth daily.     oxyCODONE (ROXICODONE) 5 MG immediate release tablet Take 1  tablet (5 mg total) by mouth every 6 (six) hours as needed for severe pain (pain score 7-10). 15 tablet 0   rosuvastatin (CRESTOR) 10 MG tablet Take 1 tablet (10 mg total) by mouth daily. 90 tablet 3   sacubitril-valsartan (ENTRESTO) 49-51 MG Take 1 tablet by mouth 2 (two) times daily. 60 tablet 3   triamcinolone (KENALOG) 0.025 % ointment Apply 1 Application topically 2 (two) times daily. Apply to left side of face 60 g 1   VENTOLIN HFA 108 (90 Base) MCG/ACT inhaler Inhale 2 puffs into the lungs every 6 (six) hours as needed for wheezing or shortness of breath. 18 g 2   spironolactone (ALDACTONE) 25 MG tablet Take 1 tablet (25 mg total) by mouth daily. 90 tablet 1   Accu-Chek Softclix Lancets lancets Use as directed to test blood sugars three times a day. (Patient not taking: Reported on 04/18/2023) 100 each 11   Blood Glucose Monitoring Suppl (ACCU-CHEK GUIDE) w/Device KIT use as directed (Patient not taking: Reported on 04/18/2023) 1 kit 0   glucose blood (TRUE METRIX BLOOD GLUCOSE TEST) test strip Use to check blood sugars up to 3 times per day (Patient not taking: Reported on  04/18/2023) 100 each 12   No facility-administered medications prior to visit.    Allergies  Allergen Reactions   Banana Itching   Latex Itching       Objective:    BP 113/74 (BP Location: Left Arm, Patient Position: Sitting, Cuff Size: Normal)   Pulse 89   Resp 19   Ht 4\' 9"  (1.448 m)   Wt 184 lb 9.6 oz (83.7 kg)   LMP 08/02/2010   SpO2 98%   BMI 39.95 kg/m  Wt Readings from Last 3 Encounters:  04/18/23 184 lb 9.6 oz (83.7 kg)  03/17/23 188 lb (85.3 kg)  03/01/23 188 lb (85.3 kg)    Physical Exam Vitals and nursing note reviewed.  Constitutional:      Appearance: She is well-developed.  HENT:     Head: Normocephalic and atraumatic.  Cardiovascular:     Rate and Rhythm: Normal rate and regular rhythm.     Heart sounds: Normal heart sounds. No murmur heard.    No friction rub. No gallop.  Pulmonary:     Effort: Pulmonary effort is normal. No tachypnea or respiratory distress.     Breath sounds: Normal breath sounds. No decreased breath sounds, wheezing, rhonchi or rales.  Chest:     Chest wall: No tenderness.  Abdominal:     General: Bowel sounds are normal.     Palpations: Abdomen is soft.  Musculoskeletal:        General: Normal range of motion.     Cervical back: Normal range of motion.  Skin:    General: Skin is warm and dry.  Neurological:     Mental Status: She is alert and oriented to person, place, and time.     Coordination: Coordination normal.  Psychiatric:        Behavior: Behavior normal. Behavior is cooperative.        Thought Content: Thought content normal.        Judgment: Judgment normal.          Patient has been counseled extensively about nutrition and exercise as well as the importance of adherence with medications and regular follow-up. The patient was given clear instructions to go to ER or return to medical center if symptoms don't improve, worsen or new problems develop. The patient  verbalized understanding.   Follow-up:  Return in about 3 months (around 07/19/2023).   Claiborne Rigg, FNP-BC [facility] [place], [place] [phone]   04/18/2023, 12:11 PM

## 2023-04-18 NOTE — Patient Instructions (Signed)
 [facility] Located in: [facility] Address: [address], [place], [place] [address] Phone: [phone]

## 2023-04-18 NOTE — Telephone Encounter (Signed)
 Copied from CRM [id number]. Topic: General - Other >> Apr 18, 2023  2:44 PM Emylou G wrote: Reason for CRM: [place] w/[employer] [phone] called checking status of Medical Record request.. could only verify patient name and dob.Marland Kitchen Pls fax or call her.Marland Kitchen

## 2023-04-21 ENCOUNTER — Ambulatory Visit (HOSPITAL_BASED_OUTPATIENT_CLINIC_OR_DEPARTMENT_OTHER): Payer: Medicaid Other | Admitting: Internal Medicine

## 2023-04-23 ENCOUNTER — Ambulatory Visit (HOSPITAL_COMMUNITY): Payer: Medicaid Other

## 2023-04-24 ENCOUNTER — Other Ambulatory Visit: Payer: Self-pay

## 2023-05-08 ENCOUNTER — Other Ambulatory Visit: Payer: Self-pay

## 2023-05-14 ENCOUNTER — Ambulatory Visit: Payer: Self-pay | Admitting: Nurse Practitioner

## 2023-05-14 ENCOUNTER — Ambulatory Visit: Admitting: Gastroenterology

## 2023-05-14 NOTE — Progress Notes (Deleted)
 Chief Complaint:colon cancer screening Primary GI Doctor:***  HPI:  Patient is a  63  year old female patient with past medical history of CHF, COPD,DDD, DM, hypertension, PAD*****who was referred to me by Collins Dean, NP on 12/17/22 for a complaint of colon cancer screening .    Interval History  Patient admits/denies GERD Patient admits/denies dysphagia Patient admits/denies nausea, vomiting, or weight loss  Patient admits/denies altered bowel habits Patient admits/denies abdominal pain Patient admits/denies rectal bleeding   Denies/Admits alcohol Denies/Admits smoking Denies/Admits NSAID use. Denies/Admits they are on blood thinners. Patient on baby ASA and Plavix  75 mg po daily  Patients last colonoscopy Patients last EGD  Patient's family history includes  Wt Readings from Last 3 Encounters:  04/18/23 184 lb 9.6 oz (83.7 kg)  03/17/23 188 lb (85.3 kg)  03/01/23 188 lb (85.3 kg)      Past Medical History:  Diagnosis Date   Cardiomyopathy    CHF (congestive heart failure) (HCC)    minamal   COPD (chronic obstructive pulmonary disease) (HCC)    DDD (degenerative disc disease), lumbar    Diabetes mellitus    Hypertension    Non compliance w medication regimen    Normal echocardiogram    LVEF 25-30% 03/27/2010   PAD (peripheral artery disease) (HCC)     Past Surgical History:  Procedure Laterality Date   ABDOMINAL AORTOGRAM W/LOWER EXTREMITY N/A 03/17/2023   Procedure: ABDOMINAL AORTOGRAM W/LOWER EXTREMITY;  Surgeon: Adine Hoof, MD;  Location: [facility] INVASIVE CV LAB;  Service: Cardiovascular;  Laterality: N/A;   CARDIAC CATHETERIZATION     PERIPHERAL VASCULAR ATHERECTOMY  03/17/2023   Procedure: PERIPHERAL VASCULAR ATHERECTOMY;  Surgeon: Adine Hoof, MD;  Location: [facility] INVASIVE CV LAB;  Service: Cardiovascular;;   PERIPHERAL VASCULAR BALLOON ANGIOPLASTY Left 03/17/2023   Procedure: PERIPHERAL VASCULAR BALLOON ANGIOPLASTY;  Surgeon:  Adine Hoof, MD;  Location: [facility] INVASIVE CV LAB;  Service: Cardiovascular;  Laterality: Left;  DCB   RIGHT/LEFT HEART CATH AND CORONARY ANGIOGRAPHY N/A 05/29/2017   Procedure: RIGHT/LEFT HEART CATH AND CORONARY ANGIOGRAPHY;  Surgeon: Mardell Shade, MD;  Location: [facility] INVASIVE CV LAB;  Service: Cardiovascular;  Laterality: N/A;   TUBAL LIGATION      Current Outpatient Medications  Medication Sig Dispense Refill   Accu-Chek Softclix Lancets lancets Use as directed to test blood sugars three times a day. (Patient not taking: Reported on 04/18/2023) 100 each 11   acetaminophen  (TYLENOL ) 500 MG tablet Take 1,500 mg by mouth daily as needed for mild pain or headache.     albuterol  (PROVENTIL ) (2.5 MG/3ML) 0.083% nebulizer solution Take 3 mLs (2.5 mg total) by nebulization every 6 (six) hours as needed for wheezing or shortness of breath. 180 mL 1   aspirin  EC 81 MG tablet Take 81 mg by mouth daily. Swallow whole.     Blood Glucose Monitoring Suppl (ACCU-CHEK GUIDE) w/Device KIT use as directed (Patient not taking: Reported on 04/18/2023) 1 kit 0   carvedilol  (COREG ) 6.25 MG tablet Take 1 tablet (6.25 mg total) by mouth 2 (two) times daily with a meal. 180 tablet 1   clopidogrel  (PLAVIX ) 75 MG tablet Take 1 tablet (75 mg total) by mouth daily. 30 tablet 11   clotrimazole  (LOTRIMIN ) 1 % cream Apply to affected area 2 times daily 15 g 0   diclofenac  Sodium (VOLTAREN ) 1 % GEL Apply 2 g topically 4 (four) times daily. For JOINT PAIN (Patient taking differently: Apply 2 g topically 4 (four)  times daily as needed (For JOINT PAIN).) 200 g 1   Dulaglutide  (TRULICITY ) 4.5 MG/0.5ML SOAJ Inject 4.5 mg as directed once a week. 6 mL 1   empagliflozin  (JARDIANCE ) 10 MG TABS tablet Take 1 tablet (10 mg total) by mouth daily before breakfast. 90 tablet 3   ezetimibe  (ZETIA ) 10 MG tablet Take 1 tablet (10 mg total) by mouth daily. 90 tablet 3   furosemide  (LASIX ) 20 MG tablet Take 1 tablet (20 mg total) by  mouth daily. 90 tablet 1   glucose blood (TRUE METRIX BLOOD GLUCOSE TEST) test strip Use to check blood sugars up to 3 times per day (Patient not taking: Reported on 04/18/2023) 100 each 12   hydrALAZINE  (APRESOLINE ) 50 MG tablet Take 1 tablet (50 mg total) by mouth 3 (three) times daily. 270 tablet 1   insulin  NPH-regular Human (HUMULIN  70/30) (70-30) 100 UNIT/ML injection Inject 42 Units into the skin 2 (two) times daily with a meal. 80 mL 1   Insulin  Syringe-Needle U-100 30G X 5/16" 0.5 ML MISC use as directed, to inject into the skin twice a day 200 each 6   Multiple Vitamin (MULTIVITAMIN WITH MINERALS) TABS tablet Take 1 tablet by mouth daily.     oxyCODONE  (ROXICODONE ) 5 MG immediate release tablet Take 1 tablet (5 mg total) by mouth every 6 (six) hours as needed for severe pain (pain score 7-10). 15 tablet 0   rosuvastatin  (CRESTOR ) 10 MG tablet Take 1 tablet (10 mg total) by mouth daily. 90 tablet 3   sacubitril -valsartan  (ENTRESTO ) 49-51 MG Take 1 tablet by mouth 2 (two) times daily. 60 tablet 3   spironolactone  (ALDACTONE ) 25 MG tablet Take 1 tablet (25 mg total) by mouth daily. 90 tablet 1   triamcinolone  (KENALOG ) 0.025 % ointment Apply 1 Application topically 2 (two) times daily. Apply to left side of face 60 g 1   VENTOLIN  HFA 108 (90 Base) MCG/ACT inhaler Inhale 2 puffs into the lungs every 6 (six) hours as needed for wheezing or shortness of breath. 18 g 2   No current facility-administered medications for this visit.    Allergies as of 05/14/2023 - Review Complete 04/18/2023  Allergen Reaction Noted   Banana Itching 01/07/2014   Latex Itching 10/24/2010    Family History  Problem Relation Age of Onset   Stroke Mother    Lung cancer Father    Diabetes Daughter     Review of Systems:    Constitutional: No weight loss, fever, chills, weakness or fatigue HEENT: Eyes: No change in vision               Ears, Nose, Throat:  No change in hearing or congestion Skin: No rash or  itching Cardiovascular: No chest pain, chest pressure or palpitations   Respiratory: No SOB or cough Gastrointestinal: See HPI and otherwise negative Genitourinary: No dysuria or change in urinary frequency Neurological: No headache, dizziness or syncope Musculoskeletal: No new muscle or joint pain Hematologic: No bleeding or bruising Psychiatric: No history of depression or anxiety    Physical Exam:  Vital signs: LMP 08/02/2010   Constitutional:   Pleasant *** female appears to be in NAD, Well developed, Well nourished, alert and cooperative Head:  Normocephalic and atraumatic. Eyes:   PEERL, EOMI. No icterus. Conjunctiva pink. Ears:  Normal auditory acuity. Neck:  Supple Throat: Oral cavity and pharynx without inflammation, swelling or lesion.  Respiratory: Respirations even and unlabored. Lungs clear to auscultation bilaterally.   No wheezes, crackles,  or rhonchi.  Cardiovascular: Normal S1, S2. Regular rate and rhythm. No peripheral edema, cyanosis or pallor.  Gastrointestinal:  Soft, nondistended, nontender. No rebound or guarding. Normal bowel sounds. No appreciable masses or hepatomegaly. Rectal:  Not performed.  Anoscopy: Msk:  Symmetrical without gross deformities. Without edema, no deformity or joint abnormality.  Neurologic:  Alert and  oriented x4;  grossly normal neurologically.  Skin:   Dry and intact without significant lesions or rashes. Psychiatric: Oriented to person, place and time. Demonstrates good judgement and reason without abnormal affect or behaviors.  RELEVANT LABS AND IMAGING: CBC    Latest Ref Rng & Units 03/17/2023    8:45 AM 03/01/2023    9:14 AM 02/14/2023   11:08 AM  CBC  WBC 4.0 - 10.5 K/uL  6.1  10.6   Hemoglobin 12.0 - 15.0 g/dL 78.2  95.6  21.3   Hematocrit 36.0 - 46.0 % 45.0  45.2  38.7   Platelets 150 - 400 K/uL  226  329      CMP     Latest Ref Rng & Units 03/17/2023    8:45 AM 03/01/2023    9:14 AM 02/14/2023   11:08 AM  CMP   Glucose 70 - 99 mg/dL 086  92  578   BUN 8 - 23 mg/dL 15  9  17    Creatinine 0.44 - 1.00 mg/dL 4.69  6.29  5.28   Sodium 135 - 145 mmol/L 140  137  136   Potassium 3.5 - 5.1 mmol/L 4.2  4.0  4.3   Chloride 98 - 111 mmol/L 103  102  102   CO2 22 - 32 mmol/L  22  23   Calcium  8.9 - 10.3 mg/dL  9.5  9.0   Total Protein 6.5 - 8.1 g/dL  6.9  6.7   Total Bilirubin 0.0 - 1.2 mg/dL  0.5  0.3   Alkaline Phos 38 - 126 U/L  92  79   AST 15 - 41 U/L  30  17   ALT 0 - 44 U/L  34  16      Lab Results  Component Value Date   TSH 1.751 06/18/2010   08/22/21 echo- Left ventricular ejection fraction, by estimation, is 55 to 60%.   Assessment: 1. ***  Plan: 1. ***   Thank you for the courtesy of this consult. Please call me with any questions or concerns.   Rhona Fusilier, FNP-C [facility] 05/14/2023, 8:21 AM  Cc: Collins Dean, NP

## 2023-05-14 NOTE — Telephone Encounter (Signed)
 Chief Complaint: Productive cough  Symptoms: Congestion, yellow phlegm, chest tightness from congestion, "rattling when breathe"  Pertinent Negatives: Patient denies difficulty breathing- "not much," states she using her inhaler, chest pain  Disposition: [x] Urgent Care (no appt availability in office)   Additional Notes: Pt states she had the flu 3 weeks ago and still has lingering symptoms. This RN advised pt to go to urgent care today as no appointment availability in office. This RN educated pt on new-worsening symptoms and when to call back/seek emergent care. Pt verbalized understanding and agrees to plan.    Copied from CRM [id number]. Topic: Clinical - Red Word Triage >> May 14, 2023  3:55 PM Donald Frost wrote: Red Word that prompted transfer to Nurse Triage: The patient called in stating she has felt bad for a weeks now. She complains of shortness of breath and chest tightness which is normally helped by an inhaler and nebulizer but not at this time. She has COPD She also states she has had congestion and cough. She did say her grandson was diagnosed with the flu a few weeks ago and then she came down with the flu. I will transfer her to E2C2 NT Reason for Disposition  Wheezing is present  Answer Assessment - Initial Assessment Questions Chief Complaint: Productive cough  Symptoms: Congestion, yellow phlegm, chest tightness from congestion, "rattling when breathe"  Pertinent Negatives: Patient denies difficulty breathing- "not much," states she using her inhaler, chest pain  Protocols used: Cough - Acute Productive-A-AH

## 2023-05-28 ENCOUNTER — Ambulatory Visit (HOSPITAL_COMMUNITY)

## 2023-05-28 ENCOUNTER — Ambulatory Visit: Attending: Vascular Surgery

## 2023-05-28 ENCOUNTER — Ambulatory Visit (HOSPITAL_COMMUNITY): Attending: Vascular Surgery

## 2023-06-10 ENCOUNTER — Other Ambulatory Visit: Payer: Self-pay

## 2023-06-11 ENCOUNTER — Other Ambulatory Visit: Payer: Self-pay

## 2023-06-13 ENCOUNTER — Other Ambulatory Visit: Payer: Self-pay

## 2023-07-16 ENCOUNTER — Other Ambulatory Visit: Payer: Self-pay | Admitting: Nurse Practitioner

## 2023-07-16 DIAGNOSIS — I5022 Chronic systolic (congestive) heart failure: Secondary | ICD-10-CM

## 2023-07-16 DIAGNOSIS — I10 Essential (primary) hypertension: Secondary | ICD-10-CM

## 2023-07-17 ENCOUNTER — Other Ambulatory Visit: Payer: Self-pay

## 2023-07-17 ENCOUNTER — Other Ambulatory Visit: Payer: Self-pay | Admitting: Nurse Practitioner

## 2023-07-17 DIAGNOSIS — E119 Type 2 diabetes mellitus without complications: Secondary | ICD-10-CM

## 2023-07-17 MED ORDER — HUMULIN 70/30 (70-30) 100 UNIT/ML ~~LOC~~ SUSP
42.0000 [IU] | Freq: Two times a day (BID) | SUBCUTANEOUS | 1 refills | Status: AC
  Filled 2023-07-17: qty 70, 83d supply, fill #0
  Filled 2023-10-10 – 2023-12-04 (×3): qty 70, 83d supply, fill #1

## 2023-07-18 ENCOUNTER — Ambulatory Visit: Attending: Nurse Practitioner | Admitting: Nurse Practitioner

## 2023-07-18 ENCOUNTER — Encounter: Payer: Self-pay | Admitting: Nurse Practitioner

## 2023-07-18 ENCOUNTER — Other Ambulatory Visit: Payer: Self-pay

## 2023-07-18 VITALS — BP 91/68 | HR 62 | Resp 18 | Ht <= 58 in | Wt 185.8 lb

## 2023-07-18 DIAGNOSIS — E119 Type 2 diabetes mellitus without complications: Secondary | ICD-10-CM | POA: Diagnosis not present

## 2023-07-18 DIAGNOSIS — E7800 Pure hypercholesterolemia, unspecified: Secondary | ICD-10-CM

## 2023-07-18 DIAGNOSIS — Z794 Long term (current) use of insulin: Secondary | ICD-10-CM

## 2023-07-18 DIAGNOSIS — I10 Essential (primary) hypertension: Secondary | ICD-10-CM | POA: Diagnosis not present

## 2023-07-18 DIAGNOSIS — Z7984 Long term (current) use of oral hypoglycemic drugs: Secondary | ICD-10-CM

## 2023-07-18 DIAGNOSIS — I5022 Chronic systolic (congestive) heart failure: Secondary | ICD-10-CM

## 2023-07-18 DIAGNOSIS — L309 Dermatitis, unspecified: Secondary | ICD-10-CM

## 2023-07-18 DIAGNOSIS — M79601 Pain in right arm: Secondary | ICD-10-CM

## 2023-07-18 MED ORDER — ENTRESTO 49-51 MG PO TABS
1.0000 | ORAL_TABLET | Freq: Two times a day (BID) | ORAL | 0 refills | Status: DC
  Filled 2023-07-18: qty 60, 30d supply, fill #0

## 2023-07-18 MED ORDER — CARVEDILOL 6.25 MG PO TABS
6.2500 mg | ORAL_TABLET | Freq: Two times a day (BID) | ORAL | 1 refills | Status: AC
  Filled 2023-07-18: qty 180, 90d supply, fill #0
  Filled 2023-08-26 – 2024-02-09 (×2): qty 180, 90d supply, fill #1

## 2023-07-18 MED ORDER — TRULICITY 4.5 MG/0.5ML ~~LOC~~ SOAJ
4.5000 mg | SUBCUTANEOUS | 1 refills | Status: AC
  Filled 2023-07-18: qty 6, 84d supply, fill #0
  Filled 2023-10-10 – 2023-10-24 (×2): qty 6, 84d supply, fill #1

## 2023-07-18 MED ORDER — ACETAMINOPHEN-CODEINE 300-30 MG PO TABS
1.0000 | ORAL_TABLET | Freq: Three times a day (TID) | ORAL | 0 refills | Status: AC | PRN
  Filled 2023-07-18: qty 60, 10d supply, fill #0

## 2023-07-18 MED ORDER — FUROSEMIDE 20 MG PO TABS
20.0000 mg | ORAL_TABLET | Freq: Every day | ORAL | 0 refills | Status: DC
  Filled 2023-07-18: qty 30, 30d supply, fill #0

## 2023-07-18 MED ORDER — EZETIMIBE 10 MG PO TABS
10.0000 mg | ORAL_TABLET | Freq: Every day | ORAL | 3 refills | Status: AC
  Filled 2023-07-18 – 2023-10-06 (×3): qty 90, 90d supply, fill #0
  Filled 2024-02-09: qty 90, 90d supply, fill #1

## 2023-07-18 MED ORDER — HYDRALAZINE HCL 50 MG PO TABS
50.0000 mg | ORAL_TABLET | Freq: Three times a day (TID) | ORAL | 1 refills | Status: AC
Start: 2023-07-18 — End: ?
  Filled 2023-07-18 – 2023-12-04 (×5): qty 270, 90d supply, fill #0

## 2023-07-18 MED ORDER — TRIAMCINOLONE ACETONIDE 0.025 % EX OINT
1.0000 | TOPICAL_OINTMENT | Freq: Two times a day (BID) | CUTANEOUS | 1 refills | Status: AC
  Filled 2023-07-18: qty 60, 30d supply, fill #0
  Filled 2023-08-26: qty 60, 30d supply, fill #1

## 2023-07-18 NOTE — Progress Notes (Signed)
 Assessment & Plan:  Felicia [name] was seen today for hypertension.  Diagnoses and all orders for this visit:  Primary hypertension -     carvedilol  (COREG ) 6.25 MG tablet; Take 1 tablet (6.25 mg total) by mouth 2 (two) times daily with a meal. -     hydrALAZINE  (APRESOLINE ) 50 MG tablet; Take 1 tablet (50 mg total) by mouth 3 (three) times daily. -     CMP14+EGFR  Right arm pain -     DG Shoulder Right; Future -     acetaminophen -codeine  (TYLENOL  #3) 300-30 MG tablet; Take 1-2 tablets by mouth every 8 (eight) hours as needed. NO REFILLS  Chronic systolic heart failure (HCC) -     carvedilol  (COREG ) 6.25 MG tablet; Take 1 tablet (6.25 mg total) by mouth 2 (two) times daily with a meal. -     furosemide  (LASIX ) 20 MG tablet; Take 1 tablet (20 mg total) by mouth daily. -     sacubitril -valsartan  (ENTRESTO ) 49-51 MG; Take 1 tablet by mouth 2 (two) times daily. -     CMP14+EGFR  Diabetes mellitus treated with insulin  and oral medication (HCC) -     Dulaglutide  (TRULICITY ) 4.5 MG/0.5ML SOAJ; Inject 4.5 mg as directed once a week.  Dermatitis -     triamcinolone  (KENALOG ) 0.025 % ointment; Apply 1 Application topically 2 (two) times daily. Apply to left side of face  Hypercholesterolemia -     ezetimibe  (ZETIA ) 10 MG tablet; Take 1 tablet (10 mg total) by mouth daily.    Patient has been counseled on age-appropriate routine health concerns for screening and prevention. These are reviewed and up-to-date. Referrals have been placed accordingly. Immunizations are up-to-date or declined.    Subjective:   Chief Complaint  Patient presents with   Hypertension    Felicia [name] 63 y.o. female presents to office today for follow-up for hypertension.   She has a past medical history of Cardiomyopathy, CHF, COPD, DDD, lumbar, Diabetes mellitus, Hypertension, Non compliance w medication regimen, Normal echocardiogram, and PAD with right lower extremity critical limb ischemia (followed by  vascular).  She is overdue for appointment with cardiology and has been instructed to schedule an appointment as soon as possible.  I will do a courtesy refill of a few of her medications today including Entresto  and furosemide    She is experiencing right bicep pain and right shoulder pain.  Aggravating factors include lying on the right side or lifting her right arm above her shoulder.  Pain described as tenderness and sore.  She denies any injury or trauma.  There is no visible bruising or hematoma on exam today.  Will get x-ray of right shoulder to rule out any potential rotator cuff injury and treat with Tylenol  3 today due to intensity of pain.  HTN Blood pressure low normal today.  She is asymptomatic.  BP Readings from Last 3 Encounters:  07/18/23 91/68  04/18/23 113/74  03/17/23 (!) 157/73    DM 2 Diabetes is well-controlled.  She is currently taking Jardiance  10 mg daily and administering Trulicity  4.5 mg weekly along with Humulin  70/30 42 units twice daily.  Weight is stable. Lab Results  Component Value Date   HGBA1C 5.7 (A) 04/18/2023    LDL at goal with Zetia  and rosuvastatin  Lab Results  Component Value Date   LDLCALC 34 12/27/2021    Review of Systems  Constitutional:  Negative for fever, malaise/fatigue and weight loss.  HENT: Negative.  Negative for nosebleeds.  Eyes: Negative.  Negative for blurred vision, double vision and photophobia.  Respiratory: Negative.  Negative for cough and shortness of breath.   Cardiovascular: Negative.  Negative for chest pain, palpitations and leg swelling.  Gastrointestinal: Negative.  Negative for heartburn, nausea and vomiting.  Musculoskeletal:  Positive for joint pain and myalgias.       SEE HPI  Neurological: Negative.  Negative for dizziness, focal weakness, seizures and headaches.  Psychiatric/Behavioral: Negative.  Negative for suicidal ideas.     Past Medical History:  Diagnosis Date   Cardiomyopathy    CHF  (congestive heart failure) (HCC)    minamal   COPD (chronic obstructive pulmonary disease) (HCC)    DDD (degenerative disc disease), lumbar    Diabetes mellitus    Hypertension    Non compliance w medication regimen    Normal echocardiogram    LVEF 25-30% 03/27/2010   PAD (peripheral artery disease) (HCC)     Past Surgical History:  Procedure Laterality Date   ABDOMINAL AORTOGRAM W/LOWER EXTREMITY N/A 03/17/2023   Procedure: ABDOMINAL AORTOGRAM W/LOWER EXTREMITY;  Surgeon: Sheree Penne Bruckner, MD;  Location[facility] INVASIVE CV LAB;  Service: Cardiovascular;  Laterality: N/A;   CARDIAC CATHETERIZATION     PERIPHERAL VASCULAR ATHERECTOMY  03/17/2023   Procedure: PERIPHERAL VASCULAR ATHERECTOMY;  Surgeon: Sheree Penne Bruckner, MD;  Location: [facility] INVASIVE CV LAB;  Service: Cardiovascular;;   PERIPHERAL VASCULAR BALLOON ANGIOPLASTY Left 03/17/2023   Procedure: PERIPHERAL VASCULAR BALLOON ANGIOPLASTY;  Surgeon: Sheree Penne Bruckner, MD;  Location: [facility] INVASIVE CV LAB;  Service: Cardiovascular;  Laterality: Left;  DCB   RIGHT/LEFT HEART CATH AND CORONARY ANGIOGRAPHY N/A 05/29/2017   Procedure: RIGHT/LEFT HEART CATH AND CORONARY ANGIOGRAPHY;  Surgeon: Cherrie Toribio SAUNDERS, MD;  Location: [facility] INVASIVE CV LAB;  Service: Cardiovascular;  Laterality: N/A;   TUBAL LIGATION      Family History  Problem Relation Age of Onset   Stroke Mother    Lung cancer Father    Diabetes Daughter     Social History Reviewed with no changes to be made today.   Outpatient Medications Prior to Visit  Medication Sig Dispense Refill   Accu-Chek Softclix Lancets lancets Use as directed to test blood sugars three times a day. 100 each 11   albuterol  (PROVENTIL ) (2.5 MG/3ML) 0.083% nebulizer solution Take 3 mLs (2.5 mg total) by nebulization every 6 (six) hours as needed for wheezing or shortness of breath. 180 mL 1   aspirin  EC 81 MG tablet Take 81 mg by mouth daily. Swallow whole.     Blood Glucose Monitoring  Suppl (ACCU-CHEK GUIDE) w/Device KIT use as directed 1 kit 0   clopidogrel  (PLAVIX ) 75 MG tablet Take 1 tablet (75 mg total) by mouth daily. 30 tablet 11   clotrimazole  (LOTRIMIN ) 1 % cream Apply to affected area 2 times daily 15 g 0   empagliflozin  (JARDIANCE ) 10 MG TABS tablet Take 1 tablet (10 mg total) by mouth daily before breakfast. 90 tablet 3   glucose blood (TRUE METRIX BLOOD GLUCOSE TEST) test strip Use to check blood sugars up to 3 times per day 100 each 12   insulin  NPH-regular Human (HUMULIN  70/30) (70-30) 100 UNIT/ML injection Inject 42 Units into the skin 2 (two) times daily with a meal. 80 mL 1   Insulin  Syringe-Needle U-100 30G X 5/16 0.5 ML MISC use as directed, to inject into the skin twice a day 200 each 6   Multiple Vitamin (MULTIVITAMIN WITH MINERALS) TABS tablet Take 1 tablet  by mouth daily.     rosuvastatin  (CRESTOR ) 10 MG tablet Take 1 tablet (10 mg total) by mouth daily. 90 tablet 3   spironolactone  (ALDACTONE ) 25 MG tablet Take 1 tablet (25 mg total) by mouth daily. 90 tablet 1   VENTOLIN  HFA 108 (90 Base) MCG/ACT inhaler Inhale 2 puffs into the lungs every 6 (six) hours as needed for wheezing or shortness of breath. 18 g 2   acetaminophen  (TYLENOL ) 500 MG tablet Take 1,500 mg by mouth daily as needed for mild pain or headache.     carvedilol  (COREG ) 6.25 MG tablet Take 1 tablet (6.25 mg total) by mouth 2 (two) times daily with a meal. 180 tablet 1   diclofenac  Sodium (VOLTAREN ) 1 % GEL Apply 2 g topically 4 (four) times daily. For JOINT PAIN (Patient taking differently: Apply 2 g topically 4 (four) times daily as needed (For JOINT PAIN).) 200 g 1   Dulaglutide  (TRULICITY ) 4.5 MG/0.5ML SOAJ Inject 4.5 mg as directed once a week. 6 mL 1   ezetimibe  (ZETIA ) 10 MG tablet Take 1 tablet (10 mg total) by mouth daily. 90 tablet 3   furosemide  (LASIX ) 20 MG tablet Take 1 tablet (20 mg total) by mouth daily. 90 tablet 1   hydrALAZINE  (APRESOLINE ) 50 MG tablet Take 1 tablet (50 mg  total) by mouth 3 (three) times daily. 270 tablet 1   sacubitril -valsartan  (ENTRESTO ) 49-51 MG Take 1 tablet by mouth 2 (two) times daily. 60 tablet 3   triamcinolone  (KENALOG ) 0.025 % ointment Apply 1 Application topically 2 (two) times daily. Apply to left side of face 60 g 1   oxyCODONE  (ROXICODONE ) 5 MG immediate release tablet Take 1 tablet (5 mg total) by mouth every 6 (six) hours as needed for severe pain (pain score 7-10). (Patient not taking: Reported on 07/18/2023) 15 tablet 0   No facility-administered medications prior to visit.    Allergies  Allergen Reactions   Banana Itching   Latex Itching       Objective:    BP 91/68 (BP Location: Left Arm, Patient Position: Sitting, Cuff Size: Normal)   Pulse 62   Resp 18   Ht 4' 9 (1.448 m)   Wt 185 lb 12.8 oz (84.3 kg)   LMP 08/02/2010   SpO2 98%   BMI 40.21 kg/m  Wt Readings from Last 3 Encounters:  07/18/23 185 lb 12.8 oz (84.3 kg)  04/18/23 184 lb 9.6 oz (83.7 kg)  03/17/23 188 lb (85.3 kg)    Physical Exam Vitals and nursing note reviewed.  Constitutional:      Appearance: She is well-developed.  HENT:     Head: Normocephalic and atraumatic.   Cardiovascular:     Rate and Rhythm: Normal rate and regular rhythm.     Heart sounds: Normal heart sounds. No murmur heard.    No friction rub. No gallop.  Pulmonary:     Effort: Pulmonary effort is normal. No tachypnea or respiratory distress.     Breath sounds: Normal breath sounds. No decreased breath sounds, wheezing, rhonchi or rales.  Chest:     Chest wall: No tenderness.  Abdominal:     General: Bowel sounds are normal.     Palpations: Abdomen is soft.   Musculoskeletal:        General: Normal range of motion.     Right upper arm: Tenderness present. No swelling, edema, deformity, lacerations or bony tenderness.     Left upper arm: Normal.  Cervical back: Normal range of motion.   Skin:    General: Skin is warm and dry.   Neurological:     Mental  Status: She is alert and oriented to person, place, and time.     Coordination: Coordination normal.   Psychiatric:        Behavior: Behavior normal. Behavior is cooperative.        Thought Content: Thought content normal.        Judgment: Judgment normal.          Patient has been counseled extensively about nutrition and exercise as well as the importance of adherence with medications and regular follow-up. The patient was given clear instructions to go to ER or return to medical center if symptoms don't improve, worsen or new problems develop. The patient verbalized understanding.   Follow-up: Return in about 4 months (around 11/17/2023).   Felicia LELON Servant, FNP-BC [facility] [place], [place] [phone]   07/18/2023, 12:18 PM

## 2023-07-19 ENCOUNTER — Ambulatory Visit: Payer: Self-pay | Admitting: Nurse Practitioner

## 2023-07-19 LAB — CMP14+EGFR
ALT: 36 IU/L — ABNORMAL HIGH (ref 0–32)
AST: 22 IU/L (ref 0–40)
Albumin: 4.2 g/dL (ref 3.9–4.9)
Alkaline Phosphatase: 134 IU/L — ABNORMAL HIGH (ref 44–121)
BUN/Creatinine Ratio: 17 (ref 12–28)
BUN: 18 mg/dL (ref 8–27)
Bilirubin Total: <0.2 mg/dL (ref 0.0–1.2)
CO2: 21 mmol/L (ref 20–29)
Calcium: 9.5 mg/dL (ref 8.7–10.3)
Chloride: 103 mmol/L (ref 96–106)
Creatinine, Ser: 1.06 mg/dL — ABNORMAL HIGH (ref 0.57–1.00)
Globulin, Total: 2.5 g/dL (ref 1.5–4.5)
Glucose: 138 mg/dL — ABNORMAL HIGH (ref 70–99)
Potassium: 4.9 mmol/L (ref 3.5–5.2)
Sodium: 140 mmol/L (ref 134–144)
Total Protein: 6.7 g/dL (ref 6.0–8.5)
eGFR: 60 mL/min/{1.73_m2} (ref >59)

## 2023-07-23 ENCOUNTER — Ambulatory Visit: Admitting: Podiatry

## 2023-07-23 DIAGNOSIS — M79674 Pain in right toe(s): Secondary | ICD-10-CM

## 2023-07-23 DIAGNOSIS — E119 Type 2 diabetes mellitus without complications: Secondary | ICD-10-CM

## 2023-07-23 DIAGNOSIS — B351 Tinea unguium: Secondary | ICD-10-CM

## 2023-07-23 DIAGNOSIS — M79675 Pain in left toe(s): Secondary | ICD-10-CM

## 2023-07-23 NOTE — Progress Notes (Signed)
 This patient presents to the office with chief complaint of long thick deformed big toenails both feet.  She says she had surgery performed years ago and both great toenails were removed and she asked for the nails to regrow.  The nails have returned and are deformed  The nails become painful when the nails are thick.  She says she is diabetic.  She presents for evaluation and treatment.    General Appearance  Alert, conversant and in no acute stress.  Vascular  Dorsalis pedis and posterior tibial  pulses are palpable  bilaterally.  Capillary return is within normal limits  bilaterally. Temperature is within normal limits  bilaterally.  Neurologic  Senn-Weinstein monofilament wire test within normal limits  bilaterally. Muscle power within normal limits bilaterally.  Nails Thick disfigured discolored nails with subungual debris   hallux to fifth toenails  B/L.Marland Kitchen No evidence of bacterial infection or drainage bilaterally.  Orthopedic  No limitations of motion  feet .  No crepitus or effusions noted.  No bony pathology or digital deformities noted.  Skin  normotropic skin with no porokeratosis noted bilaterally.  No signs of infections or ulcers noted.   Onychomycosis  B/L  Debrided nails with nail nipper followed by dremel tool usage.   RTC 3 months.   Helane Gunther DPM

## 2023-07-30 ENCOUNTER — Other Ambulatory Visit: Payer: Self-pay

## 2023-08-04 ENCOUNTER — Other Ambulatory Visit: Payer: Self-pay | Admitting: Nurse Practitioner

## 2023-08-04 ENCOUNTER — Other Ambulatory Visit: Payer: Self-pay

## 2023-08-04 DIAGNOSIS — M2550 Pain in unspecified joint: Secondary | ICD-10-CM

## 2023-08-04 DIAGNOSIS — I5022 Chronic systolic (congestive) heart failure: Secondary | ICD-10-CM

## 2023-08-07 ENCOUNTER — Other Ambulatory Visit: Payer: Self-pay

## 2023-08-07 MED ORDER — SACUBITRIL-VALSARTAN 49-51 MG PO TABS
1.0000 | ORAL_TABLET | Freq: Two times a day (BID) | ORAL | 1 refills | Status: AC
  Filled 2023-08-07 – 2023-08-26 (×3): qty 180, 90d supply, fill #0
  Filled 2023-12-04: qty 180, 90d supply, fill #1

## 2023-08-08 ENCOUNTER — Other Ambulatory Visit: Payer: Self-pay

## 2023-08-08 ENCOUNTER — Other Ambulatory Visit: Payer: Self-pay | Admitting: Nurse Practitioner

## 2023-08-08 DIAGNOSIS — M2550 Pain in unspecified joint: Secondary | ICD-10-CM

## 2023-08-11 ENCOUNTER — Other Ambulatory Visit: Payer: Self-pay

## 2023-08-12 ENCOUNTER — Other Ambulatory Visit: Payer: Self-pay

## 2023-08-18 ENCOUNTER — Other Ambulatory Visit: Payer: Self-pay

## 2023-08-20 ENCOUNTER — Other Ambulatory Visit: Payer: Self-pay

## 2023-08-26 ENCOUNTER — Other Ambulatory Visit: Payer: Self-pay

## 2023-08-26 ENCOUNTER — Other Ambulatory Visit: Payer: Self-pay | Admitting: Nurse Practitioner

## 2023-08-26 DIAGNOSIS — I5022 Chronic systolic (congestive) heart failure: Secondary | ICD-10-CM

## 2023-08-27 ENCOUNTER — Other Ambulatory Visit: Payer: Self-pay

## 2023-08-27 MED ORDER — FUROSEMIDE 20 MG PO TABS
20.0000 mg | ORAL_TABLET | Freq: Every day | ORAL | 0 refills | Status: DC
  Filled 2023-08-27: qty 30, 30d supply, fill #0

## 2023-08-29 ENCOUNTER — Other Ambulatory Visit: Payer: Self-pay

## 2023-09-04 ENCOUNTER — Other Ambulatory Visit: Payer: Self-pay

## 2023-09-18 DIAGNOSIS — H524 Presbyopia: Secondary | ICD-10-CM | POA: Diagnosis not present

## 2023-09-24 ENCOUNTER — Other Ambulatory Visit: Payer: Self-pay

## 2023-09-24 ENCOUNTER — Other Ambulatory Visit: Payer: Self-pay | Admitting: Nurse Practitioner

## 2023-09-24 DIAGNOSIS — I5022 Chronic systolic (congestive) heart failure: Secondary | ICD-10-CM

## 2023-09-25 ENCOUNTER — Other Ambulatory Visit: Payer: Self-pay

## 2023-09-25 MED ORDER — FUROSEMIDE 20 MG PO TABS
20.0000 mg | ORAL_TABLET | Freq: Every day | ORAL | 0 refills | Status: DC
  Filled 2023-09-25 – 2023-10-06 (×2): qty 90, 90d supply, fill #0

## 2023-10-02 ENCOUNTER — Other Ambulatory Visit: Payer: Self-pay

## 2023-10-03 ENCOUNTER — Other Ambulatory Visit: Payer: Self-pay

## 2023-10-06 ENCOUNTER — Other Ambulatory Visit: Payer: Self-pay

## 2023-10-10 ENCOUNTER — Other Ambulatory Visit: Payer: Self-pay

## 2023-10-16 ENCOUNTER — Other Ambulatory Visit: Payer: Self-pay

## 2023-10-20 ENCOUNTER — Other Ambulatory Visit: Payer: Self-pay

## 2023-10-21 ENCOUNTER — Other Ambulatory Visit: Payer: Self-pay

## 2023-10-23 ENCOUNTER — Ambulatory Visit: Admitting: Podiatry

## 2023-10-24 ENCOUNTER — Other Ambulatory Visit: Payer: Self-pay

## 2023-10-31 ENCOUNTER — Other Ambulatory Visit: Payer: Self-pay

## 2023-11-02 ENCOUNTER — Other Ambulatory Visit: Payer: Self-pay

## 2023-11-03 ENCOUNTER — Other Ambulatory Visit: Payer: Self-pay

## 2023-11-04 ENCOUNTER — Other Ambulatory Visit: Payer: Self-pay

## 2023-11-12 ENCOUNTER — Other Ambulatory Visit: Payer: Self-pay

## 2023-11-18 ENCOUNTER — Other Ambulatory Visit: Payer: Self-pay

## 2023-11-18 ENCOUNTER — Ambulatory Visit: Admitting: Nurse Practitioner

## 2023-11-18 NOTE — Progress Notes (Deleted)
 Cardiology Office Note:  .   Date:  11/18/2023  ID:  Felicia [name], DOB 01/08/1961, MRN [id number] PCP: Theotis Haze ORN, NP  [facility] Providers Cardiologist:  Lurena MARLA Red, MD Advanced Heart Failure:  Ezra Shuck, MD { Click to update primary MD,subspecialty MD or APP then REFRESH:1}   History of Present Illness: .   Felicia [name] is a 63 y.o. female  with a PMH of mild CAD, HFrEF, COPD, IDDM, HTN, NICM, tobacco abuse.  She underwent a 2D echo that showed EF of 20-25% and left heart cath was completed that revealed no significant CAD.  She was treated with GDMT and EF improved to 55% and patient was lost to follow-up until being seen in 2019 for evaluation of acute on chronic systolic CHF.  She underwent a R/LHC that showed well compensated hemodynamics with minimal nonobstructive CAD.  Echo was completed showing EF of 25-30% with trivial MR, mild TR PASP of 40 mmHg elevated CVP.  She was started on GDMT with  and carvedilol  was switched to Entresto .  2D echo was completed 09/2019 showing EF of 65% and patient was continued on current GDMT.  She was seen on 08/09/2021 by  Dr. Red to establish care for management of CHF.  She was continuing to do well with no interval emergency visits and patient was started on Jardiance  to maximize GDMT. Echo was repeated on 08/22/2021 that showed EF of 55 to 60% with no RWMA and moderate concentric LVH trivial MR  Patient underwent Laser arthrectomy of left SFA and popliteal artery with 1.7 mm [employer] Drug-coated balloon angioplasty with 5 mm Ranger right SFA and popliteal artery 02/2023 Dr. Sheree.  ROS: ***  Studies Reviewed: SABRA         Prior CV Studies: {Select studies to display:26339}  ***  Risk Assessment/Calculations:   {Does this patient have ATRIAL FIBRILLATION?[phone]} No BP recorded.  {Refresh Note OR Click here to enter BP  :1}***       Physical Exam:   VS:  LMP 08/02/2010    Orhtostatics: No data found. Wt  Readings from Last 3 Encounters:  07/18/23 185 lb 12.8 oz (84.3 kg)  04/18/23 184 lb 9.6 oz (83.7 kg)  03/17/23 188 lb (85.3 kg)    GEN: Well nourished, well developed in no acute distress NECK: No JVD; No carotid bruits CARDIAC: ***RRR, no murmurs, rubs, gallops RESPIRATORY:  Clear to auscultation without rales, wheezing or rhonchi  ABDOMEN: Soft, non-tender, non-distended EXTREMITIES:  No edema; No deformity   ASSESSMENT AND PLAN: .    HFimEF/NICM: -Most recent 2D echo completed 08/2021 with EF of 55 to 60% with no RWMA and moderate concentric LVH trivial MR -Today patient is euvolemic on examination. -Continue carvedilol  6.25 mg twice daily, Jardiance  10 mg daily, hydralazine  50 mg 3 times daily, Imdur  30 mg daily, spironolactone  25 mg daily Lasix  20 mg daily  Entresto  was discontinued due to patient assistance program lapse. -Order BMP and CBC today to check kidney function. -Resume Entresto  at 49/51mg  dose. -Check labs again in 2 weeks.    Essential hypertension: -Patient's blood pressure was stable at 126/86 -Continue current medications.     DM type II -Last A1C was 9.2, indicating poor control. Patient reports struggling with diet and exercise. -Continue current medications including Trulicity  and insulin . -Discuss potential addition of Ozempic at next visit.    Tobacco abuse -Patient currently smoking 5 cigarettes per day and working on cessation. -Encourage continued efforts  to quit smoking.     Nonobstructive CAD -Patient reports no chest pain or angina since previous follow-up. -Continue GDMT with carvedilol  6.25 mg twice daily, ezetimibe  10 mg daily, Crestor  10 mg daily    Peripheral Artery Disease t Laser arthrectomy of left SFA and popliteal artery with 1.7 mm Aury[employer] & Drug-coated balloon angioplasty with 5 mm Ranger right SFA and popliteal artery 02/2023 Dr. Sheree.      {Are you ordering a CV Procedure (e.g. stress test, cath, DCCV, TEE, etc)?   Press F2         :[id number]}  Dispo: ***  Signed, Olivia Pavy, PA-C

## 2023-11-26 ENCOUNTER — Other Ambulatory Visit: Payer: Self-pay

## 2023-11-27 ENCOUNTER — Other Ambulatory Visit: Payer: Self-pay

## 2023-12-02 ENCOUNTER — Ambulatory Visit: Admitting: Physician Assistant

## 2023-12-02 ENCOUNTER — Ambulatory Visit: Admitting: Nurse Practitioner

## 2023-12-04 ENCOUNTER — Other Ambulatory Visit: Payer: Self-pay

## 2023-12-04 ENCOUNTER — Encounter: Payer: Self-pay | Admitting: Nurse Practitioner

## 2023-12-04 ENCOUNTER — Ambulatory Visit: Attending: Nurse Practitioner | Admitting: Nurse Practitioner

## 2023-12-04 VITALS — BP 118/70 | HR 96 | Ht <= 58 in | Wt 188.4 lb

## 2023-12-04 DIAGNOSIS — E11311 Type 2 diabetes mellitus with unspecified diabetic retinopathy with macular edema: Secondary | ICD-10-CM | POA: Insufficient documentation

## 2023-12-04 DIAGNOSIS — Z720 Tobacco use: Secondary | ICD-10-CM | POA: Insufficient documentation

## 2023-12-04 DIAGNOSIS — I428 Other cardiomyopathies: Secondary | ICD-10-CM | POA: Insufficient documentation

## 2023-12-04 DIAGNOSIS — I739 Peripheral vascular disease, unspecified: Secondary | ICD-10-CM | POA: Insufficient documentation

## 2023-12-04 DIAGNOSIS — I10 Essential (primary) hypertension: Secondary | ICD-10-CM | POA: Insufficient documentation

## 2023-12-04 DIAGNOSIS — I5020 Unspecified systolic (congestive) heart failure: Secondary | ICD-10-CM | POA: Diagnosis not present

## 2023-12-04 DIAGNOSIS — E785 Hyperlipidemia, unspecified: Secondary | ICD-10-CM | POA: Insufficient documentation

## 2023-12-04 DIAGNOSIS — I2510 Atherosclerotic heart disease of native coronary artery without angina pectoris: Secondary | ICD-10-CM | POA: Insufficient documentation

## 2023-12-04 DIAGNOSIS — Z794 Long term (current) use of insulin: Secondary | ICD-10-CM | POA: Diagnosis not present

## 2023-12-04 LAB — LIPID PANEL

## 2023-12-04 NOTE — Patient Instructions (Signed)
 Medication Instructions:  Your physician recommends that you continue on your current medications as directed. Please refer to the Current Medication list given to you today.  *If you need a refill on your cardiac medications before your next appointment, please call your pharmacy*  Lab Work: Fasting lipid panel, CBC, CMET today  Testing/Procedures: NONE ordered at this time of appointment   Follow-Up: At [facility], you and your health needs are our priority.  As part of our continuing mission to provide you with exceptional heart care, our providers are all part of one team.  This team includes your primary Cardiologist (physician) and Advanced Practice Providers or APPs (Physician Assistants and Nurse Practitioners) who all work together to provide you with the care you need, when you need it.  Your next appointment:   1 year(s)  Provider:   Arun K Thukkani, MD    We recommend signing up for the patient portal called MyChart.  Sign up information is provided on this After Visit Summary.  MyChart is used to connect with patients for Virtual Visits (Telemedicine).  Patients are able to view lab/test results, encounter notes, upcoming appointments, etc.  Non-urgent messages can be sent to your provider as well.   To learn more about what you can do with MyChart, go to [email or web address].

## 2023-12-04 NOTE — Progress Notes (Signed)
 Office Visit    Patient Name: Felicia [name] Date of Encounter: 12/04/2023  Primary Care Provider:  Theotis Haze ORN, NP Primary Cardiologist:  Arun K Thukkani, MD  Chief Complaint    63 year old female with a history of mild nonobstructive CAD, NICM, heart failure  with improved EF, hypertension, hyperlipidemia, PAD, type 2 diabetes, COPD, and tobacco use who presents for follow-up related to CAD and heart failure.   Past Medical History    Past Medical History:  Diagnosis Date   Cardiomyopathy    CHF (congestive heart failure) (HCC)    minamal   COPD (chronic obstructive pulmonary disease) (HCC)    DDD (degenerative disc disease), lumbar    Diabetes mellitus    Hypertension    Non compliance w medication regimen    Normal echocardiogram    LVEF 25-30% 03/27/2010   PAD (peripheral artery disease)    Past Surgical History:  Procedure Laterality Date   ABDOMINAL AORTOGRAM W/LOWER EXTREMITY N/A 03/17/2023   Procedure: ABDOMINAL AORTOGRAM W/LOWER EXTREMITY;  Surgeon: Sheree Penne Bruckner, MD;  Location: [facility] INVASIVE CV LAB;  Service: Cardiovascular;  Laterality: N/A;   CARDIAC CATHETERIZATION     PERIPHERAL VASCULAR ATHERECTOMY  03/17/2023   Procedure: PERIPHERAL VASCULAR ATHERECTOMY;  Surgeon: Sheree Penne Bruckner, MD;  Location: [facility] INVASIVE CV LAB;  Service: Cardiovascular;;   PERIPHERAL VASCULAR BALLOON ANGIOPLASTY Left 03/17/2023   Procedure: PERIPHERAL VASCULAR BALLOON ANGIOPLASTY;  Surgeon: Sheree Penne Bruckner, MD;  Location: [facility] INVASIVE CV LAB;  Service: Cardiovascular;  Laterality: Left;  DCB   RIGHT/LEFT HEART CATH AND CORONARY ANGIOGRAPHY N/A 05/29/2017   Procedure: RIGHT/LEFT HEART CATH AND CORONARY ANGIOGRAPHY;  Surgeon: Cherrie Toribio SAUNDERS, MD;  Location: [facility] INVASIVE CV LAB;  Service: Cardiovascular;  Laterality: N/A;   TUBAL LIGATION      Allergies  Allergies  Allergen Reactions   Banana Itching   Latex Itching     Labs/Other Studies  Reviewed    The following studies were reviewed today:  Cardiac Studies & Procedures   ______________________________________________________________________________________________ CARDIAC CATHETERIZATION  CARDIAC CATHETERIZATION 05/29/2017  Conclusion  Prox LAD lesion is 20% stenosed.  Mid Cx lesion is 40% stenosed.  Findings:  Ao = 132/80 (100) LV = 127/19 RA = 7 RV = 38/8 PA = 39/12 (26) PCW = 14 Fick cardiac output/index = 5.3/2.9 PVR = 2.3 WU SVR = 1408 Ao sat = 93% PA sat = 66%, 68%  Assessment: 1. Co-dominant coronary system with separate ostial for LAD and LCx 2. Minimal non-obstructive CAD 3. NICM EF 25-30% 4. Well-compensated hemodynamics  Plan/Discussion:  Suspect cardiomyopathy due to HTN and untreated OSA. Will need medical therapy, sleep study and weight loss.  Can switch losartan  to Entresto  in am. Hopefully will ready for d/c in am from our perspective.  Toribio Cherrie, MD 4:56 PM  Findings Coronary Findings Diagnostic  Dominance: Right  Left Anterior Descending Prox LAD lesion is 20% stenosed.  Left Circumflex Mid Cx lesion is 40% stenosed.  Right Coronary Artery Vessel is moderate in size. Vessel is angiographically normal.  Intervention  No interventions have been documented.     ECHOCARDIOGRAM  ECHOCARDIOGRAM COMPLETE 08/22/2021  Narrative ECHOCARDIOGRAM REPORT    Patient Name:   Felicia [name] Date of Exam: 08/22/2021 Medical Rec #:  [id number]          Height:       57.5 in Accession #:    [id number]         Weight:  204.0 lb Date of Birth:  July 06, 1960          BSA:          1.826 m Patient Age:    61 years           BP:           122/80 mmHg Patient Gender: F                  HR:           56 bpm. Exam Location:  [address]  Procedure: 2D Echo, Cardiac Doppler and Color Doppler  Indications:    I42.8 Nonischemic cardiomyopathy  History:        Patient has prior history of Echocardiogram examinations,  most recent 10/21/2019. CHF and Cardiomyopathy, COPD; Risk Factors:Hypertension and Diabetes.  Sonographer:    Carl Rodgers-Jones RDCS Referring Phys: [id number] PHILIP J NAHSER  IMPRESSIONS   1. Basal inferior, inferoseptal hypokinesis. Overall unchanged from previous echo in 2021.SABRA Left ventricular ejection fraction, by estimation, is 55 to 60%. The left ventricle has normal function. The left ventricle has no regional wall motion abnormalities. There is moderate concentric left ventricular hypertrophy. Left ventricular diastolic parameters are indeterminate. 2. Right ventricular systolic function is normal. The right ventricular size is normal. 3. The mitral valve is normal in structure. Trivial mitral valve regurgitation. 4. The aortic valve is normal in structure. Aortic valve regurgitation is not visualized. 5. The inferior vena cava is normal in size with greater than 50% respiratory variability, suggesting right atrial pressure of 3 mmHg.  FINDINGS Left Ventricle: Basal inferior, inferoseptal hypokinesis. Overall unchanged from previous echo in 2021. Left ventricular ejection fraction, by estimation, is 55 to 60%. The left ventricle has normal function. The left ventricle has no regional wall motion abnormalities. The left ventricular internal cavity size was normal in size. There is moderate concentric left ventricular hypertrophy. Left ventricular diastolic parameters are indeterminate.  Right Ventricle: The right ventricular size is normal. Right vetricular wall thickness was not assessed. Right ventricular systolic function is normal.  Left Atrium: Left atrial size was normal in size.  Right Atrium: Right atrial size was normal in size.  Pericardium: There is no evidence of pericardial effusion.  Mitral Valve: The mitral valve is normal in structure. Trivial mitral valve regurgitation.  Tricuspid Valve: The tricuspid valve is normal in structure. Tricuspid valve regurgitation  is trivial.  Aortic Valve: The aortic valve is normal in structure. Aortic valve regurgitation is not visualized.  Pulmonic Valve: The pulmonic valve was normal in structure. Pulmonic valve regurgitation is not visualized.  Aorta: The aortic root and ascending aorta are structurally normal, with no evidence of dilitation.  Venous: The inferior vena cava is normal in size with greater than 50% respiratory variability, suggesting right atrial pressure of 3 mmHg.  IAS/Shunts: No atrial level shunt detected by color flow Doppler.   LEFT VENTRICLE PLAX 2D LVIDd:         3.20 cm   Diastology LVIDs:         2.30 cm   LV e' medial:    3.70 cm/s LV PW:         1.50 cm   LV E/e' medial:  15.7 LV IVS:        1.50 cm   LV e' lateral:   5.22 cm/s LVOT diam:     1.90 cm   LV E/e' lateral: 11.1 LV SV:  35 LV SV Index:   19 LVOT Area:     2.84 cm   RIGHT VENTRICLE             IVC RV Basal diam:  3.20 cm     IVC diam: 0.80 cm RV S prime:     12.55 cm/s TAPSE (M-mode): 2.0 cm  LEFT ATRIUM             Index        RIGHT ATRIUM          Index LA diam:        3.50 cm 1.92 cm/m   RA Area:     9.87 cm LA Vol (A2C):   25.4 ml 13.91 ml/m  RA Volume:   24.40 ml 13.37 ml/m LA Vol (A4C):   24.0 ml 13.15 ml/m LA Biplane Vol: 24.8 ml 13.58 ml/m AORTIC VALVE LVOT Vmax:   72.45 cm/s LVOT Vmean:  48.150 cm/s LVOT VTI:    0.124 m  AORTA Ao Root diam: 3.20 cm Ao Asc diam:  3.10 cm  MITRAL VALVE               TRICUSPID VALVE MV Area (PHT): 3.99 cm    TR Peak grad:   21.3 mmHg MV Decel Time: 190 msec    TR Vmax:        231.00 cm/s MV E velocity: 58.20 cm/s MV A velocity: 89.10 cm/s  SHUNTS MV E/A ratio:  0.65        Systemic VTI:  0.12 m Systemic Diam: 1.90 cm  Vina Gull MD Electronically signed by Vina Gull MD Signature Date/Time: 08/22/2021/5:17:18 PM    Final          ______________________________________________________________________________________________      Recent Labs: 03/01/2023: Platelets 226 03/17/2023: Hemoglobin 15.3 07/18/2023: ALT 36; BUN 18; Creatinine, Ser 1.06; Potassium 4.9; Sodium 140  Recent Lipid Panel    Component Value Date/Time   CHOL 97 (L) 12/27/2021 1225   TRIG 109 12/27/2021 1225   HDL 43 12/27/2021 1225   CHOLHDL 2.3 12/27/2021 1225   CHOLHDL 3.2 05/28/2017 0230   VLDL 13 05/28/2017 0230   LDLCALC 34 12/27/2021 1225    History of Present Illness    63 year old female with the above past medical history including mild nonobstructive CAD, NICM, heart failure with improved EF, hypertension, hyperlipidemia, PAD, type 2 diabetes, COPD, and tobacco use.  She was initially seen in 2012. Echocardiogram at the time showed EF 20 to 25%. Cardiac catheterization at that time revealed no significant coronary artery disease.  She was treated with GDMT and EF improved to 55%.  She was not seen again in follow-up until 2019 in the setting of acute on chronic systolic heart failure.  Echocardiogram at the time showed EF 25 to 30%, trivial MR, mild TR.  She underwent a R/LHC in 05/2017 that showed well compensated hemodynamics with minimal nonobstructive CAD.  GDMT was escalated, she was started on Entresto .  Echocardiogram in 9/21 showed EF improved to 65%. Echo was repeated on 08/22/2021 that showed EF of 55 to 60% with no RWMA and moderate concentric LVH trivial MR. She was last seen in the office on 12/02/2022 and was stable from a cardiac standpoint.  She denied symptoms concerning for angina.  Entresto  was resumed (this was previously discontinued due to patient assistance program labs).  She was referred to vascular surgery in the setting of worsening claudication.  She underwent Laser arthrectomy of left SFA and  popliteal artery with 1.7 mm Auryon & Drug-coated balloon angioplasty with 5 mm Ranger right SFA and popliteal artery 02/2023 Dr. Sheree.  She presents today for follow-up.  Since her last visit she has done well from a cardiac  standpoint.  She denies any symptoms concerning for angina.  Overall, she reports feeling well.    Home Medications    Current Outpatient Medications  Medication Sig Dispense Refill   Accu-Chek Softclix Lancets lancets Use as directed to test blood sugars three times a day. 100 each 11   acetaminophen -codeine  (TYLENOL  #3) 300-30 MG tablet Take 1-2 tablets by mouth every 8 (eight) hours as needed. NO REFILLS 60 tablet 0   albuterol  (PROVENTIL ) (2.5 MG/3ML) 0.083% nebulizer solution Take 3 mLs (2.5 mg total) by nebulization every 6 (six) hours as needed for wheezing or shortness of breath. 180 mL 1   aspirin  EC 81 MG tablet Take 81 mg by mouth daily. Swallow whole.     Blood Glucose Monitoring Suppl (ACCU-CHEK GUIDE) w/Device KIT use as directed 1 kit 0   carvedilol  (COREG ) 6.25 MG tablet Take 1 tablet (6.25 mg total) by mouth 2 (two) times daily with a meal. 180 tablet 1   clopidogrel  (PLAVIX ) 75 MG tablet Take 1 tablet (75 mg total) by mouth daily. 30 tablet 11   clotrimazole  (LOTRIMIN ) 1 % cream Apply to affected area 2 times daily 15 g 0   Dulaglutide  (TRULICITY ) 4.5 MG/0.5ML SOAJ Inject 4.5 mg as directed once a week. 6 mL 1   empagliflozin  (JARDIANCE ) 10 MG TABS tablet Take 1 tablet (10 mg total) by mouth daily before breakfast. 90 tablet 3   ezetimibe  (ZETIA ) 10 MG tablet Take 1 tablet (10 mg total) by mouth daily. 90 tablet 3   furosemide  (LASIX ) 20 MG tablet Take 1 tablet (20 mg total) by mouth daily. 90 tablet 0   glucose blood (TRUE METRIX BLOOD GLUCOSE TEST) test strip Use to check blood sugars up to 3 times per day 100 each 12   hydrALAZINE  (APRESOLINE ) 50 MG tablet Take 1 tablet (50 mg total) by mouth 3 (three) times daily. 270 tablet 1   insulin  NPH-regular Human (HUMULIN  70/30) (70-30) 100 UNIT/ML injection Inject 42 Units into the skin 2 (two) times daily with a meal. 80 mL 1   Insulin  Syringe-Needle U-100 30G X 5/16 0.5 ML MISC use as directed, to inject into the skin twice a  day 200 each 6   Multiple Vitamin (MULTIVITAMIN WITH MINERALS) TABS tablet Take 1 tablet by mouth daily.     rosuvastatin  (CRESTOR ) 10 MG tablet Take 1 tablet (10 mg total) by mouth daily. 90 tablet 3   sacubitril -valsartan  (ENTRESTO ) 49-51 MG Take 1 tablet by mouth 2 (two) times daily. 180 tablet 1   spironolactone  (ALDACTONE ) 25 MG tablet Take 1 tablet (25 mg total) by mouth daily. 90 tablet 1   triamcinolone  (KENALOG ) 0.025 % ointment Apply 1 Application topically 2 (two) times daily. Apply to left side of face 60 g 1   VENTOLIN  HFA 108 (90 Base) MCG/ACT inhaler Inhale 2 puffs into the lungs every 6 (six) hours as needed for wheezing or shortness of breath. 18 g 2   No current facility-administered medications for this visit.     Review of Systems    She denies chest pain, palpitations, dyspnea, pnd, orthopnea, n, v, dizziness, syncope, edema, weight gain, or early satiety. All other systems reviewed and are otherwise negative except as noted above.   Physical Exam  VS:  BP 118/70 (BP Location: Left Arm, Patient Position: Sitting, Cuff Size: Large)   Pulse 96   Ht 4' 9 (1.448 m)   Wt 188 lb 6.4 oz (85.5 kg)   LMP 08/02/2010   SpO2 96%   BMI 40.77 kg/m   GEN: Well nourished, well developed, in no acute distress. HEENT: normal. Neck: Supple, no JVD, carotid bruits, or masses. Cardiac: RRR, no murmurs, rubs, or gallops. No clubbing, cyanosis, edema.  Radials/DP/PT 2+ and equal bilaterally.  Respiratory:  Respirations regular and unlabored, clear to auscultation bilaterally. GI: Soft, nontender, nondistended, BS + x 4. MS: no deformity or atrophy. Skin: warm and dry, no rash. Neuro:  Strength and sensation are intact. Psych: Normal affect.  Accessory Clinical Findings    ECG personally reviewed by me today - EKG Interpretation Date/Time:  Thursday December 04 2023 13:53:49 EST Ventricular Rate:  94 PR Interval:  172 QRS Duration:  86 QT Interval:  372 QTC  Calculation: 465 R Axis:   -18  Text Interpretation: Normal sinus rhythm Anterior infarct , age undetermined When compared with ECG of 12-Jan-2018 21:17, PREVIOUS ECG IS PRESENT Confirmed by Daneen Perkins ([id number]) on 12/04/2023 2:06:40 PM  - no acute changes.   Lab Results  Component Value Date   WBC 6.1 03/01/2023   HGB 15.3 (H) 03/17/2023   HCT 45.0 03/17/2023   MCV 93.8 03/01/2023   PLT 226 03/01/2023   Lab Results  Component Value Date   CREATININE 1.06 (H) 07/18/2023   BUN 18 07/18/2023   NA 140 07/18/2023   K 4.9 07/18/2023   CL 103 07/18/2023   CO2 21 07/18/2023   Lab Results  Component Value Date   ALT 36 (H) 07/18/2023   AST 22 07/18/2023   ALKPHOS 134 (H) 07/18/2023   BILITOT <0.2 07/18/2023   Lab Results  Component Value Date   CHOL 97 (L) 12/27/2021   HDL 43 12/27/2021   LDLCALC 34 12/27/2021   TRIG 109 12/27/2021   CHOLHDL 2.3 12/27/2021    Lab Results  Component Value Date   HGBA1C 5.7 (A) 04/18/2023    Assessment & Plan    1. Nonobstructive CAD: R/LHC in 05/2017 that showed well compensated hemodynamics with minimal nonobstructive CAD. Stable with no anginal symptoms. No indication for ischemic evaluation.  Continue aspirin , Plavix , Entresto , carvedilol , hydralazine , Jardiance , Crestor , and Zetia .  2. HFimpEF/NICM: Prior EF 20 to 25%.  Most recent 2D echo completed 08/2021 with EF of 55 to 60% with no RWMA and moderate concentric LVH trivial MR. Euvolemic and well compensated on exam.  Will update CBC, CMET today.  Continue current medications as above.    3. Essential hypertension: BP well controlled. Continue current antihypertensive regimen.    4. Hyperlipidemia: LDL was 34 in 12/2021.  Will update fasting lipid panel, CMET.  Continue Crestor , Zetia .    5. PAD: S/p laser arthrectomy of left SFA and popliteal artery with 1.7 mm Auryon & Drug-coated balloon angioplasty with 5 mm Ranger right SFA and popliteal artery 02/2023 follows with vascular  surgery.  Continue aspirin , Plavix , Crestor .  6. Type 2 diabetes: A1c was 5.7 in 03/2023.  Monitored and managed per PCP.  7. Tobacco use: She continues to smoke.  Full cessation advised.  8. Disposition: Follow-up in 1 year.     Perkins JAYSON Daneen, NP 12/04/2023, 2:06 PM

## 2023-12-05 LAB — CBC
Hematocrit: 45.3 % (ref 34.0–46.6)
Hemoglobin: 14.6 g/dL (ref 11.1–15.9)
MCH: 31.4 pg (ref 26.6–33.0)
MCHC: 32.2 g/dL (ref 31.5–35.7)
MCV: 97 fL (ref 79–97)
Platelets: 308 x10E3/uL (ref 150–450)
RBC: 4.65 x10E6/uL (ref 3.77–5.28)
RDW: 14.6 % (ref 11.7–15.4)
WBC: 10.9 x10E3/uL — ABNORMAL HIGH (ref 3.4–10.8)

## 2023-12-05 LAB — LIPID PANEL
Cholesterol, Total: 157 mg/dL (ref 100–199)
HDL: 45 mg/dL (ref >39)
LDL CALC COMMENT:: 3.5 ratio (ref 0.0–4.4)
LDL Chol Calc (NIH): 94 mg/dL (ref 0–99)
Triglycerides: 99 mg/dL (ref 0–149)
VLDL Cholesterol Cal: 18 mg/dL (ref 5–40)

## 2023-12-05 LAB — COMPREHENSIVE METABOLIC PANEL WITH GFR
ALT: 18 IU/L (ref 0–32)
AST: 15 IU/L (ref 0–40)
Albumin: 4.3 g/dL (ref 3.9–4.9)
Alkaline Phosphatase: 116 IU/L (ref 49–135)
BUN/Creatinine Ratio: 22 (ref 12–28)
BUN: 19 mg/dL (ref 8–27)
Bilirubin Total: <0.2 mg/dL (ref 0.0–1.2)
CO2: 25 mmol/L (ref 20–29)
Calcium: 9.9 mg/dL (ref 8.7–10.3)
Chloride: 103 mmol/L (ref 96–106)
Creatinine, Ser: 0.86 mg/dL (ref 0.57–1.00)
Globulin, Total: 2.3 g/dL (ref 1.5–4.5)
Glucose: 46 mg/dL — AB (ref 70–99)
Potassium: 4 mmol/L (ref 3.5–5.2)
Sodium: 142 mmol/L (ref 134–144)
Total Protein: 6.6 g/dL (ref 6.0–8.5)
eGFR: 77 mL/min/1.73 (ref >59)

## 2023-12-07 ENCOUNTER — Encounter: Payer: Self-pay | Admitting: Nurse Practitioner

## 2023-12-10 ENCOUNTER — Ambulatory Visit: Payer: Self-pay | Admitting: Nurse Practitioner

## 2023-12-10 DIAGNOSIS — I2510 Atherosclerotic heart disease of native coronary artery without angina pectoris: Secondary | ICD-10-CM

## 2023-12-11 NOTE — Telephone Encounter (Signed)
 Patient is returning call.

## 2023-12-25 ENCOUNTER — Other Ambulatory Visit: Payer: Self-pay | Admitting: Family Medicine

## 2023-12-25 ENCOUNTER — Other Ambulatory Visit: Payer: Self-pay

## 2023-12-25 DIAGNOSIS — J449 Chronic obstructive pulmonary disease, unspecified: Secondary | ICD-10-CM

## 2023-12-25 MED ORDER — ALBUTEROL SULFATE (2.5 MG/3ML) 0.083% IN NEBU
2.5000 mg | INHALATION_SOLUTION | Freq: Four times a day (QID) | RESPIRATORY_TRACT | 0 refills | Status: AC | PRN
  Filled 2023-12-25: qty 180, 15d supply, fill #0

## 2023-12-26 ENCOUNTER — Other Ambulatory Visit: Payer: Self-pay

## 2024-01-05 ENCOUNTER — Other Ambulatory Visit: Payer: Self-pay

## 2024-02-09 ENCOUNTER — Other Ambulatory Visit: Payer: Self-pay | Admitting: Internal Medicine

## 2024-02-09 ENCOUNTER — Other Ambulatory Visit: Payer: Self-pay | Admitting: Nurse Practitioner

## 2024-02-09 ENCOUNTER — Other Ambulatory Visit: Payer: Self-pay

## 2024-02-09 DIAGNOSIS — I5022 Chronic systolic (congestive) heart failure: Secondary | ICD-10-CM

## 2024-02-09 DIAGNOSIS — E1169 Type 2 diabetes mellitus with other specified complication: Secondary | ICD-10-CM

## 2024-02-09 DIAGNOSIS — Z7984 Long term (current) use of oral hypoglycemic drugs: Secondary | ICD-10-CM

## 2024-02-12 ENCOUNTER — Other Ambulatory Visit: Payer: Self-pay

## 2024-02-16 ENCOUNTER — Other Ambulatory Visit: Payer: Self-pay

## 2024-02-16 MED ORDER — FUROSEMIDE 20 MG PO TABS
20.0000 mg | ORAL_TABLET | Freq: Every day | ORAL | 0 refills | Status: AC
  Filled 2024-02-16: qty 90, 90d supply, fill #0

## 2024-02-16 NOTE — Telephone Encounter (Signed)
 In accordance with refill protocols, please review and address the following requirements before this medication refill can be authorized:  Labs

## 2024-02-25 ENCOUNTER — Other Ambulatory Visit: Payer: Self-pay
